# Patient Record
Sex: Female | Born: 1945 | Race: White | Hispanic: No | Smoking: Former smoker
Health system: Southern US, Community
[De-identification: ages and names within clinical notes are randomized; demographics above are authoritative.]

## PROBLEM LIST (undated history)

## (undated) DIAGNOSIS — M533 Sacrococcygeal disorders, not elsewhere classified: Secondary | ICD-10-CM

## (undated) DIAGNOSIS — F329 Major depressive disorder, single episode, unspecified: Secondary | ICD-10-CM

## (undated) DIAGNOSIS — Z801 Family history of malignant neoplasm of trachea, bronchus and lung: Secondary | ICD-10-CM

## (undated) DIAGNOSIS — Z808 Family history of malignant neoplasm of other organs or systems: Secondary | ICD-10-CM

## (undated) DIAGNOSIS — R51 Headache: Secondary | ICD-10-CM

## (undated) DIAGNOSIS — M75111 Incomplete rotator cuff tear or rupture of right shoulder, not specified as traumatic: Secondary | ICD-10-CM

## (undated) DIAGNOSIS — E785 Hyperlipidemia, unspecified: Secondary | ICD-10-CM

## (undated) DIAGNOSIS — F32A Depression, unspecified: Secondary | ICD-10-CM

## (undated) DIAGNOSIS — E049 Nontoxic goiter, unspecified: Secondary | ICD-10-CM

## (undated) DIAGNOSIS — K219 Gastro-esophageal reflux disease without esophagitis: Secondary | ICD-10-CM

## (undated) DIAGNOSIS — M1990 Unspecified osteoarthritis, unspecified site: Secondary | ICD-10-CM

## (undated) DIAGNOSIS — Z5189 Encounter for other specified aftercare: Secondary | ICD-10-CM

## (undated) DIAGNOSIS — R52 Pain, unspecified: Secondary | ICD-10-CM

## (undated) DIAGNOSIS — L309 Dermatitis, unspecified: Secondary | ICD-10-CM

## (undated) DIAGNOSIS — I2510 Atherosclerotic heart disease of native coronary artery without angina pectoris: Secondary | ICD-10-CM

## (undated) DIAGNOSIS — H269 Unspecified cataract: Secondary | ICD-10-CM

## (undated) DIAGNOSIS — R42 Dizziness and giddiness: Secondary | ICD-10-CM

## (undated) DIAGNOSIS — I639 Cerebral infarction, unspecified: Secondary | ICD-10-CM

## (undated) DIAGNOSIS — I10 Essential (primary) hypertension: Secondary | ICD-10-CM

## (undated) DIAGNOSIS — Z9889 Other specified postprocedural states: Secondary | ICD-10-CM

## (undated) DIAGNOSIS — Z800 Family history of malignant neoplasm of digestive organs: Secondary | ICD-10-CM

## (undated) DIAGNOSIS — Z803 Family history of malignant neoplasm of breast: Secondary | ICD-10-CM

## (undated) DIAGNOSIS — F419 Anxiety disorder, unspecified: Secondary | ICD-10-CM

## (undated) DIAGNOSIS — Z8051 Family history of malignant neoplasm of kidney: Secondary | ICD-10-CM

## (undated) HISTORY — DX: Nontoxic goiter, unspecified: E04.9

## (undated) HISTORY — DX: Hyperlipidemia, unspecified: E78.5

## (undated) HISTORY — DX: Incomplete rotator cuff tear or rupture of right shoulder, not specified as traumatic: M75.111

## (undated) HISTORY — DX: Dizziness and giddiness: R42

## (undated) HISTORY — DX: Essential (primary) hypertension: I10

## (undated) HISTORY — PX: BUNIONECTOMY: SHX129

## (undated) HISTORY — DX: Major depressive disorder, single episode, unspecified: F32.9

## (undated) HISTORY — DX: Anxiety disorder, unspecified: F41.9

## (undated) HISTORY — DX: Other specified postprocedural states: Z98.890

## (undated) HISTORY — DX: Encounter for other specified aftercare: Z51.89

## (undated) HISTORY — PX: CHOLECYSTECTOMY: SHX55

## (undated) HISTORY — DX: Sacrococcygeal disorders, not elsewhere classified: M53.3

## (undated) HISTORY — DX: Headache: R51

## (undated) HISTORY — PX: ABDOMINAL HYSTERECTOMY: SHX81

## (undated) HISTORY — DX: Dermatitis, unspecified: L30.9

## (undated) HISTORY — DX: Family history of malignant neoplasm of trachea, bronchus and lung: Z80.1

## (undated) HISTORY — DX: Unspecified cataract: H26.9

## (undated) HISTORY — DX: Family history of malignant neoplasm of digestive organs: Z80.0

## (undated) HISTORY — DX: Family history of malignant neoplasm of kidney: Z80.51

## (undated) HISTORY — PX: BREAST CYST ASPIRATION: SHX578

## (undated) HISTORY — DX: Family history of malignant neoplasm of other organs or systems: Z80.8

## (undated) HISTORY — PX: BREAST CYST EXCISION: SHX579

## (undated) HISTORY — PX: HAND SURGERY: SHX662

## (undated) HISTORY — PX: BREAST LUMPECTOMY: SHX2

## (undated) HISTORY — DX: Family history of malignant neoplasm of breast: Z80.3

## (undated) HISTORY — DX: Depression, unspecified: F32.A

## (undated) HISTORY — DX: Cerebral infarction, unspecified: I63.9

---

## 1987-07-24 DIAGNOSIS — Z5189 Encounter for other specified aftercare: Secondary | ICD-10-CM

## 1987-07-24 HISTORY — DX: Encounter for other specified aftercare: Z51.89

## 1998-09-28 ENCOUNTER — Other Ambulatory Visit: Admission: RE | Admit: 1998-09-28 | Discharge: 1998-09-28 | Payer: Self-pay | Admitting: *Deleted

## 1999-09-28 ENCOUNTER — Other Ambulatory Visit: Admission: RE | Admit: 1999-09-28 | Discharge: 1999-09-28 | Payer: Self-pay | Admitting: *Deleted

## 2000-09-23 ENCOUNTER — Other Ambulatory Visit: Admission: RE | Admit: 2000-09-23 | Discharge: 2000-09-23 | Payer: Self-pay | Admitting: *Deleted

## 2001-09-30 ENCOUNTER — Other Ambulatory Visit: Admission: RE | Admit: 2001-09-30 | Discharge: 2001-09-30 | Payer: Self-pay | Admitting: *Deleted

## 2002-07-23 DIAGNOSIS — I639 Cerebral infarction, unspecified: Secondary | ICD-10-CM

## 2002-07-23 HISTORY — DX: Cerebral infarction, unspecified: I63.9

## 2002-09-09 ENCOUNTER — Other Ambulatory Visit: Admission: RE | Admit: 2002-09-09 | Discharge: 2002-09-09 | Payer: Self-pay | Admitting: *Deleted

## 2002-12-08 ENCOUNTER — Encounter: Payer: Self-pay | Admitting: Internal Medicine

## 2003-09-13 ENCOUNTER — Other Ambulatory Visit: Admission: RE | Admit: 2003-09-13 | Discharge: 2003-09-13 | Payer: Self-pay | Admitting: *Deleted

## 2004-07-23 HISTORY — PX: SHOULDER SURGERY: SHX246

## 2004-08-07 ENCOUNTER — Ambulatory Visit: Payer: Self-pay | Admitting: Internal Medicine

## 2004-08-11 ENCOUNTER — Ambulatory Visit: Payer: Self-pay | Admitting: Internal Medicine

## 2004-09-12 ENCOUNTER — Other Ambulatory Visit: Admission: RE | Admit: 2004-09-12 | Discharge: 2004-09-12 | Payer: Self-pay | Admitting: *Deleted

## 2004-11-28 ENCOUNTER — Ambulatory Visit: Payer: Self-pay | Admitting: Internal Medicine

## 2005-03-14 ENCOUNTER — Ambulatory Visit: Payer: Self-pay | Admitting: Internal Medicine

## 2005-08-02 ENCOUNTER — Ambulatory Visit: Payer: Self-pay | Admitting: Internal Medicine

## 2005-08-30 ENCOUNTER — Ambulatory Visit: Payer: Self-pay | Admitting: Internal Medicine

## 2005-09-06 ENCOUNTER — Encounter: Admission: RE | Admit: 2005-09-06 | Discharge: 2005-09-06 | Payer: Self-pay | Admitting: Internal Medicine

## 2005-09-14 ENCOUNTER — Ambulatory Visit: Payer: Self-pay

## 2005-09-25 ENCOUNTER — Ambulatory Visit: Payer: Self-pay | Admitting: Internal Medicine

## 2005-10-03 ENCOUNTER — Other Ambulatory Visit: Admission: RE | Admit: 2005-10-03 | Discharge: 2005-10-03 | Payer: Self-pay | Admitting: *Deleted

## 2005-10-05 ENCOUNTER — Ambulatory Visit: Payer: Self-pay | Admitting: Internal Medicine

## 2005-10-15 ENCOUNTER — Encounter: Admission: RE | Admit: 2005-10-15 | Discharge: 2005-10-15 | Payer: Self-pay | Admitting: Internal Medicine

## 2005-10-23 ENCOUNTER — Encounter: Admission: RE | Admit: 2005-10-23 | Discharge: 2005-10-23 | Payer: Self-pay | Admitting: *Deleted

## 2005-11-12 ENCOUNTER — Ambulatory Visit: Payer: Self-pay | Admitting: Internal Medicine

## 2006-03-29 ENCOUNTER — Ambulatory Visit: Payer: Self-pay | Admitting: Internal Medicine

## 2006-04-04 ENCOUNTER — Encounter: Admission: RE | Admit: 2006-04-04 | Discharge: 2006-04-04 | Payer: Self-pay | Admitting: Internal Medicine

## 2006-04-18 ENCOUNTER — Ambulatory Visit: Payer: Self-pay | Admitting: Internal Medicine

## 2006-05-13 ENCOUNTER — Ambulatory Visit: Payer: Self-pay | Admitting: Family Medicine

## 2006-05-23 ENCOUNTER — Ambulatory Visit: Payer: Self-pay | Admitting: Endocrinology

## 2006-06-19 ENCOUNTER — Ambulatory Visit: Payer: Self-pay | Admitting: Internal Medicine

## 2006-06-19 LAB — CONVERTED CEMR LAB
BUN: 14 mg/dL (ref 6–23)
CO2: 25 meq/L (ref 19–32)
Calcium: 9.4 mg/dL (ref 8.4–10.5)
Chloride: 104 meq/L (ref 96–112)
Glomerular Filtration Rate, Af Am: 82 mL/min/{1.73_m2}
Glucose, Bld: 131 mg/dL — ABNORMAL HIGH (ref 70–99)
HCT: 41.9 % (ref 36.0–46.0)
Hemoglobin: 14.1 g/dL (ref 12.0–15.0)
Platelets: 233 10*3/uL (ref 150–400)
WBC: 6.8 10*3/uL (ref 4.5–10.5)

## 2006-06-20 ENCOUNTER — Ambulatory Visit: Payer: Self-pay | Admitting: Internal Medicine

## 2006-08-07 ENCOUNTER — Ambulatory Visit: Payer: Self-pay | Admitting: Internal Medicine

## 2006-10-21 ENCOUNTER — Other Ambulatory Visit: Admission: RE | Admit: 2006-10-21 | Discharge: 2006-10-21 | Payer: Self-pay | Admitting: *Deleted

## 2006-11-12 DIAGNOSIS — E042 Nontoxic multinodular goiter: Secondary | ICD-10-CM

## 2007-01-22 ENCOUNTER — Ambulatory Visit: Payer: Self-pay | Admitting: Internal Medicine

## 2007-01-22 DIAGNOSIS — M25559 Pain in unspecified hip: Secondary | ICD-10-CM

## 2007-01-26 LAB — CONVERTED CEMR LAB
Free T4: 0.6 ng/dL (ref 0.6–1.6)
T3, Free: 3.3 pg/mL (ref 2.3–4.2)
TSH: 1.56 microintl units/mL (ref 0.35–5.50)

## 2007-01-27 ENCOUNTER — Encounter: Payer: Self-pay | Admitting: Internal Medicine

## 2007-01-27 ENCOUNTER — Encounter: Admission: RE | Admit: 2007-01-27 | Discharge: 2007-01-27 | Payer: Self-pay | Admitting: Internal Medicine

## 2007-01-27 ENCOUNTER — Encounter (INDEPENDENT_AMBULATORY_CARE_PROVIDER_SITE_OTHER): Payer: Self-pay | Admitting: *Deleted

## 2007-02-03 ENCOUNTER — Encounter (INDEPENDENT_AMBULATORY_CARE_PROVIDER_SITE_OTHER): Payer: Self-pay | Admitting: *Deleted

## 2007-04-18 ENCOUNTER — Encounter (INDEPENDENT_AMBULATORY_CARE_PROVIDER_SITE_OTHER): Payer: Self-pay | Admitting: *Deleted

## 2007-05-09 ENCOUNTER — Encounter: Payer: Self-pay | Admitting: Endocrinology

## 2007-05-09 ENCOUNTER — Ambulatory Visit: Payer: Self-pay | Admitting: Internal Medicine

## 2007-05-09 DIAGNOSIS — I10 Essential (primary) hypertension: Secondary | ICD-10-CM | POA: Insufficient documentation

## 2007-05-12 LAB — CONVERTED CEMR LAB
ALT: 15 units/L (ref 0–35)
Basophils Absolute: 0 10*3/uL (ref 0.0–0.1)
Eosinophils Absolute: 0 10*3/uL (ref 0.0–0.6)
Eosinophils Relative: 0.6 % (ref 0.0–5.0)
GFR calc Af Amer: 93 mL/min
GFR calc non Af Amer: 77 mL/min
Glucose, Bld: 109 mg/dL — ABNORMAL HIGH (ref 70–99)
HCT: 40.5 % (ref 36.0–46.0)
HDL: 41.2 mg/dL (ref >39.0)
Lymphocytes Relative: 29.6 % (ref 12.0–46.0)
MCHC: 34.8 g/dL (ref 30.0–36.0)
MCV: 94.7 fL (ref 78.0–100.0)
Neutro Abs: 4.4 10*3/uL (ref 1.4–7.7)
Neutrophils Relative %: 62.8 % (ref 43.0–77.0)
Platelets: 215 10*3/uL (ref 150–400)
Potassium: 5 meq/L (ref 3.5–5.1)
Sodium: 143 meq/L (ref 135–145)
WBC: 7 10*3/uL (ref 4.5–10.5)

## 2007-05-28 ENCOUNTER — Encounter: Payer: Self-pay | Admitting: Internal Medicine

## 2007-06-02 ENCOUNTER — Telehealth (INDEPENDENT_AMBULATORY_CARE_PROVIDER_SITE_OTHER): Payer: Self-pay | Admitting: *Deleted

## 2007-06-17 ENCOUNTER — Ambulatory Visit: Payer: Self-pay | Admitting: Internal Medicine

## 2007-07-24 DIAGNOSIS — Z9889 Other specified postprocedural states: Secondary | ICD-10-CM

## 2007-07-24 HISTORY — DX: Other specified postprocedural states: Z98.890

## 2007-09-17 ENCOUNTER — Ambulatory Visit: Payer: Self-pay | Admitting: Internal Medicine

## 2007-10-06 ENCOUNTER — Other Ambulatory Visit: Admission: RE | Admit: 2007-10-06 | Discharge: 2007-10-06 | Payer: Self-pay | Admitting: *Deleted

## 2007-10-15 ENCOUNTER — Ambulatory Visit: Payer: Self-pay | Admitting: Internal Medicine

## 2007-10-15 DIAGNOSIS — F419 Anxiety disorder, unspecified: Secondary | ICD-10-CM

## 2007-10-15 DIAGNOSIS — F329 Major depressive disorder, single episode, unspecified: Secondary | ICD-10-CM

## 2007-10-17 ENCOUNTER — Encounter (INDEPENDENT_AMBULATORY_CARE_PROVIDER_SITE_OTHER): Payer: Self-pay | Admitting: *Deleted

## 2007-10-22 ENCOUNTER — Telehealth (INDEPENDENT_AMBULATORY_CARE_PROVIDER_SITE_OTHER): Payer: Self-pay | Admitting: *Deleted

## 2007-10-22 ENCOUNTER — Encounter: Admission: RE | Admit: 2007-10-22 | Discharge: 2007-10-22 | Payer: Self-pay | Admitting: Internal Medicine

## 2007-10-27 ENCOUNTER — Ambulatory Visit: Payer: Self-pay | Admitting: Internal Medicine

## 2007-10-30 ENCOUNTER — Encounter: Payer: Self-pay | Admitting: Internal Medicine

## 2007-10-30 ENCOUNTER — Telehealth (INDEPENDENT_AMBULATORY_CARE_PROVIDER_SITE_OTHER): Payer: Self-pay | Admitting: *Deleted

## 2007-11-05 ENCOUNTER — Encounter: Payer: Self-pay | Admitting: Internal Medicine

## 2007-11-06 ENCOUNTER — Telehealth: Payer: Self-pay | Admitting: Internal Medicine

## 2007-11-16 ENCOUNTER — Encounter: Admission: RE | Admit: 2007-11-16 | Discharge: 2007-11-16 | Payer: Self-pay | Admitting: Otolaryngology

## 2007-11-24 ENCOUNTER — Ambulatory Visit: Payer: Self-pay | Admitting: Internal Medicine

## 2007-11-25 ENCOUNTER — Telehealth (INDEPENDENT_AMBULATORY_CARE_PROVIDER_SITE_OTHER): Payer: Self-pay | Admitting: *Deleted

## 2007-11-25 ENCOUNTER — Ambulatory Visit: Payer: Self-pay | Admitting: Internal Medicine

## 2007-11-25 ENCOUNTER — Observation Stay (HOSPITAL_COMMUNITY): Admission: EM | Admit: 2007-11-25 | Discharge: 2007-11-26 | Payer: Self-pay | Admitting: Emergency Medicine

## 2007-11-27 ENCOUNTER — Telehealth (INDEPENDENT_AMBULATORY_CARE_PROVIDER_SITE_OTHER): Payer: Self-pay | Admitting: *Deleted

## 2008-01-19 ENCOUNTER — Encounter: Payer: Self-pay | Admitting: Internal Medicine

## 2008-06-23 ENCOUNTER — Telehealth (INDEPENDENT_AMBULATORY_CARE_PROVIDER_SITE_OTHER): Payer: Self-pay | Admitting: *Deleted

## 2008-07-23 DIAGNOSIS — F32A Depression, unspecified: Secondary | ICD-10-CM

## 2008-07-23 HISTORY — DX: Depression, unspecified: F32.A

## 2008-07-28 ENCOUNTER — Telehealth (INDEPENDENT_AMBULATORY_CARE_PROVIDER_SITE_OTHER): Payer: Self-pay | Admitting: *Deleted

## 2008-08-26 ENCOUNTER — Telehealth (INDEPENDENT_AMBULATORY_CARE_PROVIDER_SITE_OTHER): Payer: Self-pay | Admitting: *Deleted

## 2008-09-27 ENCOUNTER — Ambulatory Visit: Payer: Self-pay | Admitting: Family Medicine

## 2008-09-27 ENCOUNTER — Telehealth (INDEPENDENT_AMBULATORY_CARE_PROVIDER_SITE_OTHER): Payer: Self-pay | Admitting: *Deleted

## 2008-11-19 ENCOUNTER — Encounter (INDEPENDENT_AMBULATORY_CARE_PROVIDER_SITE_OTHER): Payer: Self-pay | Admitting: *Deleted

## 2008-12-14 ENCOUNTER — Ambulatory Visit: Payer: Self-pay | Admitting: Internal Medicine

## 2008-12-23 LAB — CONVERTED CEMR LAB
BUN: 19 mg/dL (ref 6–23)
Basophils Absolute: 0 10*3/uL (ref 0.0–0.1)
Basophils Relative: 0 % (ref 0.0–3.0)
CO2: 27 meq/L (ref 19–32)
Chloride: 106 meq/L (ref 96–112)
Creatinine, Ser: 0.7 mg/dL (ref 0.4–1.2)
Eosinophils Absolute: 0 10*3/uL (ref 0.0–0.7)
Glucose, Bld: 124 mg/dL — ABNORMAL HIGH (ref 70–99)
MCHC: 34.6 g/dL (ref 30.0–36.0)
MCV: 94.6 fL (ref 78.0–100.0)
Monocytes Absolute: 0.5 10*3/uL (ref 0.1–1.0)
Neutrophils Relative %: 60.3 % (ref 43.0–77.0)
Platelets: 171 10*3/uL (ref 150.0–400.0)
RBC: 4.49 M/uL (ref 3.87–5.11)
RDW: 12.8 % (ref 11.5–14.6)

## 2009-05-30 ENCOUNTER — Telehealth (INDEPENDENT_AMBULATORY_CARE_PROVIDER_SITE_OTHER): Payer: Self-pay | Admitting: *Deleted

## 2009-06-01 ENCOUNTER — Encounter (INDEPENDENT_AMBULATORY_CARE_PROVIDER_SITE_OTHER): Payer: Self-pay | Admitting: *Deleted

## 2009-07-18 ENCOUNTER — Ambulatory Visit: Payer: Self-pay | Admitting: Internal Medicine

## 2009-07-18 LAB — CONVERTED CEMR LAB: Vit D, 25-Hydroxy: 14 ng/mL — ABNORMAL LOW (ref 30–89)

## 2009-07-19 ENCOUNTER — Encounter (INDEPENDENT_AMBULATORY_CARE_PROVIDER_SITE_OTHER): Payer: Self-pay | Admitting: *Deleted

## 2009-07-20 ENCOUNTER — Telehealth (INDEPENDENT_AMBULATORY_CARE_PROVIDER_SITE_OTHER): Payer: Self-pay | Admitting: *Deleted

## 2009-07-20 LAB — CONVERTED CEMR LAB
AST: 22 units/L (ref 0–37)
Albumin: 4.1 g/dL (ref 3.5–5.2)
Alkaline Phosphatase: 71 units/L (ref 39–117)
BUN: 14 mg/dL (ref 6–23)
CO2: 27 meq/L (ref 19–32)
Calcium: 9.4 mg/dL (ref 8.4–10.5)
Chloride: 105 meq/L (ref 96–112)
Cholesterol: 209 mg/dL — ABNORMAL HIGH (ref 0–200)
Creatinine, Ser: 0.7 mg/dL (ref 0.4–1.2)
Total Bilirubin: 0.5 mg/dL (ref 0.3–1.2)
Total CHOL/HDL Ratio: 5
Total Protein: 7.5 g/dL (ref 6.0–8.3)
VLDL: 39 mg/dL (ref 0.0–40.0)

## 2009-07-25 ENCOUNTER — Ambulatory Visit: Payer: Self-pay | Admitting: Internal Medicine

## 2009-07-25 ENCOUNTER — Encounter: Payer: Self-pay | Admitting: Internal Medicine

## 2009-08-05 ENCOUNTER — Encounter (INDEPENDENT_AMBULATORY_CARE_PROVIDER_SITE_OTHER): Payer: Self-pay | Admitting: *Deleted

## 2009-08-08 ENCOUNTER — Ambulatory Visit: Payer: Self-pay | Admitting: Gastroenterology

## 2009-08-10 ENCOUNTER — Telehealth (INDEPENDENT_AMBULATORY_CARE_PROVIDER_SITE_OTHER): Payer: Self-pay | Admitting: *Deleted

## 2009-08-12 ENCOUNTER — Ambulatory Visit: Payer: Self-pay | Admitting: Family

## 2009-08-12 DIAGNOSIS — M25519 Pain in unspecified shoulder: Secondary | ICD-10-CM

## 2009-08-18 ENCOUNTER — Ambulatory Visit: Payer: Self-pay | Admitting: Gastroenterology

## 2009-08-23 ENCOUNTER — Telehealth: Payer: Self-pay | Admitting: Internal Medicine

## 2009-08-27 ENCOUNTER — Encounter: Admission: RE | Admit: 2009-08-27 | Discharge: 2009-08-27 | Payer: Self-pay | Admitting: Orthopedic Surgery

## 2009-09-02 ENCOUNTER — Encounter: Payer: Self-pay | Admitting: Internal Medicine

## 2009-09-09 ENCOUNTER — Telehealth: Payer: Self-pay | Admitting: Internal Medicine

## 2009-09-13 ENCOUNTER — Encounter: Payer: Self-pay | Admitting: Internal Medicine

## 2009-09-30 ENCOUNTER — Telehealth (INDEPENDENT_AMBULATORY_CARE_PROVIDER_SITE_OTHER): Payer: Self-pay | Admitting: *Deleted

## 2009-10-05 ENCOUNTER — Telehealth (INDEPENDENT_AMBULATORY_CARE_PROVIDER_SITE_OTHER): Payer: Self-pay | Admitting: *Deleted

## 2009-10-27 ENCOUNTER — Telehealth: Payer: Self-pay | Admitting: Internal Medicine

## 2010-01-30 ENCOUNTER — Encounter: Payer: Self-pay | Admitting: Internal Medicine

## 2010-04-13 ENCOUNTER — Telehealth: Payer: Self-pay | Admitting: Family Medicine

## 2010-06-12 ENCOUNTER — Ambulatory Visit: Payer: Self-pay | Admitting: Internal Medicine

## 2010-06-12 DIAGNOSIS — E785 Hyperlipidemia, unspecified: Secondary | ICD-10-CM

## 2010-06-12 DIAGNOSIS — M1990 Unspecified osteoarthritis, unspecified site: Secondary | ICD-10-CM

## 2010-06-19 ENCOUNTER — Ambulatory Visit: Payer: Self-pay | Admitting: Family Medicine

## 2010-06-19 DIAGNOSIS — L0291 Cutaneous abscess, unspecified: Secondary | ICD-10-CM

## 2010-06-19 DIAGNOSIS — L0390 Cellulitis, unspecified: Secondary | ICD-10-CM

## 2010-06-26 ENCOUNTER — Encounter: Payer: Self-pay | Admitting: Internal Medicine

## 2010-07-05 ENCOUNTER — Encounter
Admission: RE | Admit: 2010-07-05 | Discharge: 2010-07-05 | Payer: Self-pay | Source: Home / Self Care | Attending: Specialist | Admitting: Specialist

## 2010-07-12 ENCOUNTER — Ambulatory Visit: Payer: Self-pay | Admitting: Internal Medicine

## 2010-07-13 LAB — CONVERTED CEMR LAB
ALT: 16 units/L (ref 0–35)
AST: 20 units/L (ref 0–37)
HDL: 40.4 mg/dL (ref >39.00)
Total CHOL/HDL Ratio: 4

## 2010-07-19 ENCOUNTER — Encounter: Payer: Self-pay | Admitting: Internal Medicine

## 2010-08-01 ENCOUNTER — Encounter: Payer: Self-pay | Admitting: Internal Medicine

## 2010-08-14 ENCOUNTER — Telehealth: Payer: Self-pay | Admitting: Internal Medicine

## 2010-08-22 NOTE — Letter (Signed)
Summary: Surgical Clearance/[facility]  Surgical Clearance/[facility]   Imported By: Lanelle Bal 09/19/2009 09:09:35  _____________________________________________________________________  External Attachment:    Type:   Image     Comment:   External Document

## 2010-08-22 NOTE — Progress Notes (Signed)
Summary: diarrhea  Phone Note Call from Patient   Caller: daughter Elita Quick (Dr. Lindaann Slough nurse) Summary of Call: Diarrhea x yesterday - no relief with immodium.  Patient had shoulder surgery 2 weeks ago.  Patient has recently visited people in the hospital. ?norovirus? ?continue fluids & immodium, watch for signs of dehydration? ?stool cx to check for c.diff since she has been in the hosp recently? Shary Decamp  October 27, 2009 10:46 AM   Follow-up for Phone Call        yes please , get stools  for c. diff Floy Riegler E. Doris Mcgilvery MD  October 27, 2009 12:52 PM   DISCUSSED WITH DAUGHTER -- ORDER IN THE COMPUTER Shary Decamp  October 27, 2009 1:00 PM   New Problems: DIARRHEA [phone])   New Problems: DIARRHEA (ICD-787.91)

## 2010-08-22 NOTE — Letter (Signed)
Summary: [facility] Instructions  [facility]  [address] [place], [place] [address]   Phone: [phone]  Fax: [phone]       [name]    May 11, 1946    MRN: [id number]        Procedure Day /Date:  08/18/09  Thursday     Arrival Time:_ 9:00am     Procedure Time:_10:00am     Location of Procedure:                    _ x_  [facility] (4th Floor)                        PREPARATION FOR COLONOSCOPY WITH MOVIPREP   Starting 5 days prior to your procedure _1/22/11 _ do not eat nuts, seeds, popcorn, corn, beans, peas,  salads, or any raw vegetables.  Do not take any fiber supplements (e.g. Metamucil, Citrucel, and Benefiber).  THE DAY BEFORE YOUR PROCEDURE         DATE:  08/17/09  DAY:  Wednesday  1.  Drink clear liquids the entire day-NO SOLID FOOD  2.  Do not drink anything colored red or purple.  Avoid juices with pulp.  No orange juice.  3.  Drink at least 64 oz. (8 glasses) of fluid/clear liquids during the day to prevent dehydration and help the prep work efficiently.  CLEAR LIQUIDS INCLUDE: Water Jello Ice Popsicles Tea (sugar ok, no milk/cream) Powdered fruit flavored drinks Coffee (sugar ok, no milk/cream) Gatorade Juice: apple, white grape, white cranberry  Lemonade Clear bullion, consomm, broth Carbonated beverages (any kind) Strained chicken noodle soup Hard Candy                             4.  In the morning, mix first dose of MoviPrep solution:    Empty 1 Pouch A and 1 Pouch B into the disposable container    Add lukewarm drinking water to the top line of the container. Mix to dissolve    Refrigerate (mixed solution should be used within 24 hrs)  5.  Begin drinking the prep at 5:00 p.m. The MoviPrep container is divided by 4 marks.   Every 15 minutes drink the solution down to the next mark (approximately 8 oz) until the full liter is complete.   6.  Follow completed prep with 16 oz of clear liquid of your  choice (Nothing red or purple).  Continue to drink clear liquids until bedtime.  7.  Before going to bed, mix second dose of MoviPrep solution:    Empty 1 Pouch A and 1 Pouch B into the disposable container    Add lukewarm drinking water to the top line of the container. Mix to dissolve    Refrigerate  THE DAY OF YOUR PROCEDURE      DATE:  08/18/09 DAY:   Thursday  Beginning at _5:00 a.m. (5 hours before procedure):         1. Every 15 minutes, drink the solution down to the next mark (approx 8 oz) until the full liter is complete.  2. Follow completed prep with 16 oz. of clear liquid of your choice.    3. You may drink clear liquids until 8:00am (2 HOURS BEFORE PROCEDURE).   MEDICATION INSTRUCTIONS  Unless otherwise instructed, you should take regular prescription medications with a small sip of water   as early as  possible the morning of your procedure.           OTHER INSTRUCTIONS  You will need a responsible adult at least 67 years of age to accompany you and drive you home.   This person must remain in the waiting room during your procedure.  Wear loose fitting clothing that is easily removed.  Leave jewelry and other valuables at home.  However, you may wish to bring a book to read or  an iPod/MP3 player to listen to music as you wait for your procedure to start.  Remove all body piercing jewelry and leave at home.  Total time from sign-in until discharge is approximately 2-3 hours.  You should go home directly after your procedure and rest.  You can resume normal activities the  day after your procedure.  The day of your procedure you should not:   Drive   Make legal decisions   Operate machinery   Drink alcohol   Return to work  You will receive specific instructions about eating, activities and medications before you leave.    The above instructions have been reviewed and explained to me by   Wyona Almas RN  August 08, 2009 10:25  AM     I fully understand and can verbalize these instructions _____________________________ Date _________

## 2010-08-22 NOTE — Progress Notes (Signed)
Summary: referral to surgeon  Phone Note Call from Patient Call back at Home Phone [phone]   Summary of Call: PATIENT LEFT MESSAGE ON VM:  - went to ortho for shoulder, has MRI on shoulder scheduled for 2/17  - ortho found a lump under left arm -- ortho suggested referral to surgeon for excision ok to refer to surgeon? Shary Decamp  August 23, 2009 11:56 AM   Follow-up for Phone Call        yes Follow-up by: [facility] E. Aide Wojnar MD,  August 23, 2009 1:59 PM

## 2010-08-22 NOTE — Assessment & Plan Note (Signed)
Summary: F/U AND DISCUSS LABS/KB   Vital Signs:  Patient profile:   65 year old female Height:      60.75 inches Weight:      181.38 pounds BMI:     34.68 Pulse rate:   95 / minute Pulse rhythm:   regular BP sitting:   136 / 84  (left arm) Cuff size:   large  Vitals Entered By: Army Fossa CMA (June 12, 2010 11:06 AM) CC: F/u, fasting  Comments c/o back pain in the center, pain in her legs. Ongoing for 6 months [facility]  Discuss flu shot  Unsure if she is taking metorprolol- she says she has always taken it.    History of Present Illness: routine office visit  She was seen in December 2010 for a CPX, was found to have a low vitamin D and high cholesterol. Was prescribed ergocalciferol, states she took it. Was prescribed simvastatin, she took it for while but then she ran out  complaining of low back pain for almost a year. Pain is there most of the time, occasionally gets worse, radiates to the right buttock and right leg. she has no history of injury or previous surgery in the back In the past she was prescribed muscle relaxant  for  other issues and relaxants helped   Review of systems Denies any fevers No rash in the back No bladder or bowel incontinence some tingling in the right foot   Current Medications (verified): 1)  Felodipine 10 Mg Tb24 (Felodipine) .... Take 1 Tablet By Mouth Once A Day - 2)  Celebrex 200 Mg  Caps (Celecoxib) .... One P.o. Daily 3)  Bayer Aspirin Ec Low Dose 81 Mg  Tbec (Aspirin) 4)  Citalopram Hydrobromide 20 Mg  Tabs (Citalopram Hydrobromide) .Marland Kitchen.. 1 By Mouth Qd 5)  Alprazolam 0.25 Mg  Tabs (Alprazolam) .Marland Kitchen.. 1--2 By Mouth Three Times A Day Prn 6)  Omeprazole 20 Mg Tbec (Omeprazole) .Marland Kitchen.. 1 By Mouth Once Daily 7)  Vitamin D (Ergocalciferol) 50000 Unit Caps (Ergocalciferol) .... Take One Tablet Weekly 8)  Simvastatin 20 Mg Tabs (Simvastatin) .... Take One Tablet At Bedtime 9)  Ultram 50 Mg Tabs (Tramadol Hcl) .... One  Tablet By Mouth Three Times A Day As Needed Pain 10)  Metoprolol Tartrate 25 Mg Tabs (Metoprolol Tartrate) .... Take One Tablet Two Times A Day 2 Days Before Surgery and 2 Days After Surgery  Allergies (verified): 1)  ! Codeine 2)  ! Zoloft  Past History:  Past Medical History: Anxiety--Depression Headache ECZEMA  HYPERTENSION   GOITER, MULTINODULAR   Dizziness, sever: MRI chronic isch. changes and  mastoiditis-saw Dr Lenard Forth (2007) Cath 5-09   A.  Revealing nonobstructive coronary artery disease with tubular,  discrete, 40% mid lesion in the circumflex; discrete 30% proximal lesion  in the LAD; luminal irregularities, 35% mid lesion in RCA; and generic  40% mid lesion in the RCA.  Medical treatment recommended.     Hyperlipidemia  Past Surgical History: Reviewed history from 07/18/2009 and no changes required. Cholecystectomy Hysterectomy, L oophorectomy L shoulder surgery aprox 2006  Social History: Married 4 kids exercise : not recent exercise   tobacco-- quit aprox 2000  after 30 years  ETOH-- no  Physical Exam  General:  alert and well-developed.   Lungs:  normal respiratory effort, no intercostal retractions, no accessory muscle use, and normal breath sounds.   Heart:  normal rate, regular rhythm, no murmur, and no gallop.   Msk:  slightly  tender at the lower right back Extremities:  no pretibial edema bilaterally  Neurologic:  alert & oriented X3, strength normal in all extremities, and DTRs symmetrical , normal knee jerk, decrease ankle reflex B . negative straight leg test   Impression & Recommendations:  Problem # 1:  BACK PAIN (ICD-724.5)  back pain for one year with radiculopathy features Continue with her present care and add a muscle relaxant Referred to orthopedic surgery Her updated medication list for this problem includes:    Bayer Aspirin Ec Low Dose 81 Mg Tbec (Aspirin)    Celebrex 200 Mg Caps (Celecoxib) ..... One p.o. daily    Ultram 50 Mg  Tabs (Tramadol hcl) ..... One tablet by mouth three times a day as needed pain    Flexeril 10 Mg Tabs (Cyclobenzaprine hcl) ..... One by mouth twice a day as needed pain  Orders: Orthopedic Referral (Ortho)  Problem # 2:  HYPERTENSION (ICD-401.9) at goal  The following medications were removed from the medication list:    Metoprolol Tartrate 25 Mg Tabs (Metoprolol tartrate) .Marland Kitchen... Take one tablet two times a day 2 days before surgery and 2 days after surgery Her updated medication list for this problem includes:    Felodipine 10 Mg Tb24 (Felodipine) .Marland Kitchen... Take 1 tablet by mouth once a day -  BP today: 136/84 Prior BP: 122/78 (08/12/2009)  Labs Reviewed: K+: 4.1 (07/18/2009) Creat: : 0.7 (07/18/2009)   Chol: 209 (07/18/2009)   HDL: 39.80 (07/18/2009)   LDL: DEL (05/09/2007)   TG: 195.0 (07/18/2009)  Problem # 3:  HYPERLIPIDEMIA (ICD-272.4) was recommended simvastatin based on the FLP from 12-10. took it for a few days but ran out. Plan: Prescription provided, see instructions Her updated medication list for this problem includes:    Simvastatin 20 Mg Tabs (Simvastatin) .Marland Kitchen... Take one tablet at bedtime  Labs Reviewed: SGOT: 22 (07/18/2009)   SGPT: 17 (07/18/2009)   HDL:39.80 (07/18/2009), 41.2 (05/09/2007)  LDL:DEL (05/09/2007)  Chol:209 (07/18/2009), 203 (05/09/2007)  Trig:195.0 (07/18/2009), 140 (05/09/2007)  Complete Medication List: 1)  Bayer Aspirin Ec Low Dose 81 Mg Tbec (Aspirin) 2)  Simvastatin 20 Mg Tabs (Simvastatin) .... Take one tablet at bedtime 3)  Felodipine 10 Mg Tb24 (Felodipine) .... Take 1 tablet by mouth once a day - 4)  Celebrex 200 Mg Caps (Celecoxib) .... One p.o. daily 5)  Ultram 50 Mg Tabs (Tramadol hcl) .... One tablet by mouth three times a day as needed pain 6)  Flexeril 10 Mg Tabs (Cyclobenzaprine hcl) .... One by mouth twice a day as needed pain 7)  Citalopram Hydrobromide 20 Mg Tabs (Citalopram hydrobromide) .Marland Kitchen.. 1 by mouth qd 8)  Alprazolam 0.25 Mg  Tabs (Alprazolam) .Marland Kitchen.. 1--2 by mouth three times a day prn 9)  Omeprazole 20 Mg Tbec (Omeprazole) .Marland Kitchen.. 1 by mouth once daily  Other Orders: Flu Vaccine 11yrs + MEDICARE PATIENTS (Z6109) Administration Flu vaccine - MCR (U0454)  Patient Instructions: 1)  Back pain: continue with Celebrex and Ultram. 2)  also  try Flexeril, twice a day, it is a muscle relaxant. Will make her drowsy.  3)  heating pad 4)  orthopedic referal 5)  start simvastatin 6)  Please schedule a follow-up appointment in 6 weeks, fasting Prescriptions: CELEBREX 200 MG  CAPS (CELECOXIB) one p.o. daily  #30 x 12   Entered and Authorized by:   Jose E. Paz MD   Signed by:   Nolon Rod. Paz MD on 06/12/2010   Method used:   Print then  Give to Patient   RxID:   [id number] ULTRAM 50 MG TABS (TRAMADOL HCL) one tablet by mouth three times a day as needed pain  #60 x 0   Entered and Authorized by:   Nolon Rod. Paz MD   Signed by:   Nolon Rod. Paz MD on 06/12/2010   Method used:   Print then Give to Patient   RxID:   [id number] ALPRAZOLAM 0.25 MG  TABS (ALPRAZOLAM) 1--2 by mouth three times a day prn  #30 x 3   Entered and Authorized by:   Nolon Rod. Paz MD   Signed by:   Nolon Rod. Paz MD on 06/12/2010   Method used:   Print then Give to Patient   RxID:   [id number] FLEXERIL 10 MG TABS (CYCLOBENZAPRINE HCL) one by mouth twice a day as needed pain  #60 x 0   Entered and Authorized by:   Nolon Rod. Paz MD   Signed by:   Nolon Rod. Paz MD on 06/12/2010   Method used:   Print then Give to Patient   RxID:   [id number] SIMVASTATIN 20 MG TABS (SIMVASTATIN) take one tablet at bedtime  #30 Tablet x 6   Entered by:   Army Fossa CMA   Authorized by:   Nolon Rod. Paz MD   Signed by:   Nolon Rod. Paz MD on 06/12/2010   Method used:   Electronically to        [id number] (retail)       [address]       [place], [place]  [address]       Ph: [phone] or [phone]       Fax:  [phone]   RxID:   [id number] OMEPRAZOLE 20 MG TBEC (OMEPRAZOLE) 1 by mouth once daily  #30 Capsule x 12   Entered by:   Army Fossa CMA   Authorized by:   Nolon Rod. Paz MD   Signed by:   Nolon Rod. Paz MD on 06/12/2010   Method used:   Electronically to        [id number]* (retail)       [address]       [place], [place]  [address]       Ph: [phone] or [phone]       Fax: [phone]   RxID:   [id number] CITALOPRAM HYDROBROMIDE 20 MG  TABS (CITALOPRAM HYDROBROMIDE) 1 by mouth qd  #30 Tablet x 12   Entered by:   Army Fossa CMA   Authorized by:   Nolon Rod. Paz MD   Signed by:   Nolon Rod. Paz MD on 06/12/2010   Method used:   Electronically to        [id number]* (retail)       [address]       [place], [place]  [address]       Ph: [phone] or [phone]       Fax: [phone]   RxID:   [id number] FELODIPINE 10 MG TB24 (FELODIPINE) Take 1 tablet by mouth once a day -  #30 x 12   Entered by:   Army Fossa CMA   Authorized by:   Nolon Rod. Paz MD   Signed by:   Nolon Rod. Paz MD  on 06/12/2010   Method used:   Electronically to        [id number] (retail)       [address]       [place], [place]  [address]       Ph: [phone] or [phone]       Fax: [phone]   RxID:   [id number]    Orders Added: 1)  Flu Vaccine 54yrs + MEDICARE PATIENTS [Q2039] 2)  Administration Flu vaccine - MCR [G0008] 3)  Orthopedic Referral [Ortho] 4)  Est. Patient Level III [27[address]] Flu Vaccine Consent Questions     Do you have a history of severe allergic reactions to this vaccine? no    Any prior history of allergic reactions to egg and/or gelatin? no    Do you have a sensitivity to the preservative Thimersol? no    Do you have a past history of Guillan-Barre Syndrome? no    Do you currently have an acute febrile illness? no    Have  you ever had a severe reaction to latex? no    Vaccine information given and explained to patient? yes    Are you currently pregnant? no    Lot Number:[id number]   Exp Date:01/20/2011   Site Given  Left Deltoid IMration Flu vaccine - MCR [G0008]       .[email or web address]

## 2010-08-22 NOTE — Assessment & Plan Note (Signed)
Summary: EAR PAIN//PH   Vital Signs:  Patient profile:   65 year old female Weight:      180 pounds Temp:     99.1 degrees F oral BP sitting:   120 / 78  (left arm)  Vitals Entered By: Doristine Devoid CMA (June 19, 2010 1:15 PM) CC: L ear pain w/ redness and swellen along w/ HA hurts to lay on L side of face   History of Present Illness: 65 yo woman here today w/ L ear pain.  woke up Saturday morning w/ ear pain and inability to lie on L side of face/head.  feels like face is swollen.  ear is red and swollen.  no trauma to ear.  recent exposure to MRSA.  subjective fever at home.  no internal ear pain.  no drainage from ear.  Current Medications (verified): 1)  Bayer Aspirin Ec Low Dose 81 Mg  Tbec (Aspirin) 2)  Simvastatin 20 Mg Tabs (Simvastatin) .... Take One Tablet At Bedtime 3)  Felodipine 10 Mg Tb24 (Felodipine) .... Take 1 Tablet By Mouth Once A Day - 4)  Celebrex 200 Mg  Caps (Celecoxib) .... One P.o. Daily 5)  Ultram 50 Mg Tabs (Tramadol Hcl) .... One Tablet By Mouth Three Times A Day As Needed Pain 6)  Flexeril 10 Mg Tabs (Cyclobenzaprine Hcl) .... One By Mouth Twice A Day As Needed Pain 7)  Citalopram Hydrobromide 20 Mg  Tabs (Citalopram Hydrobromide) .Marland Kitchen.. 1 By Mouth Qd 8)  Alprazolam 0.25 Mg  Tabs (Alprazolam) .Marland Kitchen.. 1--2 By Mouth Three Times A Day Prn 9)  Omeprazole 20 Mg Tbec (Omeprazole) .Marland Kitchen.. 1 By Mouth Once Daily  Allergies (verified): 1)  ! Codeine 2)  ! Zoloft  Review of Systems      See HPI  Physical Exam  General:  alert and well-developed.   Head:  NCAT, no TTP over sinuses Ears:  L outer ear red, swollen, warm to the touch + scab in crease of outer helix TM normal   Impression & Recommendations:  Problem # 1:  CELLULITIS AND ABSCESS OF UNSPECIFIED SITE (ICD-682.9) Assessment New obvious cellulitis of outer ear.  given recent MRSA exposure will start Doxy.  reviewed supportive care and red flags that should prompt return.  Pt expresses  understanding and is in agreement w/ this plan. Her updated medication list for this problem includes:    Doxycycline Hyclate 100 Mg Caps (Doxycycline hyclate) .Marland Kitchen... Take 1 tab twice a day.  take w/ food.  Complete Medication List: 1)  Bayer Aspirin Ec Low Dose 81 Mg Tbec (Aspirin) 2)  Simvastatin 20 Mg Tabs (Simvastatin) .... Take one tablet at bedtime 3)  Felodipine 10 Mg Tb24 (Felodipine) .... Take 1 tablet by mouth once a day - 4)  Celebrex 200 Mg Caps (Celecoxib) .... One p.o. daily 5)  Ultram 50 Mg Tabs (Tramadol hcl) .... One tablet by mouth three times a day as needed pain 6)  Flexeril 10 Mg Tabs (Cyclobenzaprine hcl) .... One by mouth twice a day as needed pain 7)  Citalopram Hydrobromide 20 Mg Tabs (Citalopram hydrobromide) .Marland Kitchen.. 1 by mouth qd 8)  Alprazolam 0.25 Mg Tabs (Alprazolam) .Marland Kitchen.. 1--2 by mouth three times a day prn 9)  Omeprazole 20 Mg Tbec (Omeprazole) .Marland Kitchen.. 1 by mouth once daily 10)  Doxycycline Hyclate 100 Mg Caps (Doxycycline hyclate) .... Take 1 tab twice a day.  take w/ food.  Patient Instructions: 1)  Follow up if no improvement or at any time worsening  2)  Ice the ear to help with the swelling 3)  Take the Doxycycline w/ food to avoid upset stomach 4)  Call with any questions or concerns 5)  Hang in there!!! Prescriptions: DOXYCYCLINE HYCLATE 100 MG CAPS (DOXYCYCLINE HYCLATE) Take 1 tab twice a day.  take w/ food.  #20 x 0   Entered and Authorized by:   Neena Rhymes MD   Signed by:   Neena Rhymes MD on 06/19/2010   Method used:   Electronically to        [id number] (retail)       [address]       [place], [place]  [address]       Ph: [phone] or [phone]       Fax: [phone]   RxID:   [id number]    Orders Added: 1)  Est. Patient Level III [50093]

## 2010-08-22 NOTE — Miscellaneous (Signed)
Summary: BONE DENSITY  Clinical Lists Changes  Orders: Added new Test order of T-Bone Densitometry (77080) - Signed Added new Test order of T-Lumbar Vertebral Assessment (77082) - Signed 

## 2010-08-22 NOTE — Progress Notes (Signed)
  Phone Note Outgoing Call   Call placed by: Doristine Devoid,  October 05, 2009 10:32 AM Call placed to: Patient Summary of Call: spoke with patient informed about medication to be taken before surgery.Marland KitchenMarland KitchenDoristine Devoid  October 05, 2009 10:33 AM     New/Updated Medications: METOPROLOL TARTRATE 25 MG TABS (METOPROLOL TARTRATE) take one tablet two times a day 2 days before surgery and 2 days after surgery Prescriptions: METOPROLOL TARTRATE 25 MG TABS (METOPROLOL TARTRATE) take one tablet two times a day 2 days before surgery and 2 days after surgery  #4 x 0   Entered by:   Doristine Devoid   Authorized by:   Nolon Rod. Paz MD   Signed by:   Doristine Devoid on 10/05/2009   Method used:   Electronically to        [id number] (retail)       [address]       [place], [place]  [address]       Ph: [phone] or [phone]       Fax: [phone]   RxID:   [id number]

## 2010-08-22 NOTE — Consult Note (Signed)
Summary: [facility]   Imported By: Lanelle Bal 10/11/2009 11:58:42  _____________________________________________________________________  External Attachment:    Type:   Image     Comment:   External Document

## 2010-08-22 NOTE — Miscellaneous (Signed)
Summary: [facility] Previsit/prep  Clinical Lists Changes  Medications: Added new medication of MOVIPREP 100 GM  SOLR (PEG-KCL-NACL-NASULF-NA ASC-C) As per prep instructions. - Signed Rx of MOVIPREP 100 GM  SOLR (PEG-KCL-NACL-NASULF-NA ASC-C) As per prep instructions.;  #1 x 0;  Signed;  Entered by: Wyona Almas RN;  Authorized by: Meryl Dare MD [facility];  Method used: Electronically to [facility] # [id number]*, [address] [place], [place], [place]  [address], Ph: [phone] or [phone], Fax: [phone] Allergies: Changed allergy or adverse reaction from CODEINE to CODEINE Observations: Added new observation of ALLERGY REV: Done (08/08/2009 9:45)    Prescriptions: MOVIPREP 100 GM  SOLR (PEG-KCL-NACL-NASULF-NA ASC-C) As per prep instructions.  #1 x 0   Entered by:   Wyona Almas RN   Authorized by:   Meryl Dare MD [facility]   Signed by:   Wyona Almas RN on 08/08/2009   Method used:   Electronically to        [id number] (retail)       [address]       [place], [place]  [address]       Ph: [phone] or [phone]       Fax: [phone]   RxID:   [id number]

## 2010-08-22 NOTE — Progress Notes (Signed)
Summary: XRAY OF HER SHOULDER  Phone Note Call from Patient Call back at [facility] Phone [phone]   Caller: Patient Summary of Call: [facility] IS CALLING WANTING TO HAVE AN X-RAY OF HER LEFT SHOULDER DONE. COULD YOU PLEASE REFERRAL HER FOR THIS. Initial call taken by: Freddy Jaksch,  August 10, 2009 11:10 AM  Follow-up for Phone Call        Why does patient need referral? We have to attach a Diagnosis code with every referral. This is Dr.Paz patient, when reason given by patient please forward to [facility] Follow-up by: Shonna Chock,  August 10, 2009 11:15 AM  Additional Follow-up for Phone Call Additional follow up Details #1::        Patient had surgery on her shoulder several years back.  She has a hx of bone spur.  She is now having shoulder pain & popping.  Scheduled ov for pt on Fri 1/21 with Sandford Craze, NP Shary Decamp  August 10, 2009 4:34 PM

## 2010-08-22 NOTE — Progress Notes (Signed)
  Phone Note From Other Clinic   Summary of Call: Received call from [place] at [facility].  Pt is having left shoulder surgery on 10/10/09. She is requesting most recent office visit, labs and EKG from any date as surgical clearance has already been given. Notes, labs and EKG faxed to [phone] attn: Huntley Dec.  Initial call taken by: Mervin Kung CMA,  September 30, 2009 12:12 PM

## 2010-08-22 NOTE — Procedures (Signed)
Summary: Colonoscopy  Patient: [name] Note: All result statuses are Final unless otherwise noted.  Tests: (1) Colonoscopy (COL)   COL Colonoscopy           DONE     [facility]     [address]     [place], [place]  [address]           COLONOSCOPY PROCEDURE REPORT           PATIENT:  [name], [name]  MR#:  [id number]     BIRTHDATE:  July 31, 1945, 63 yrs. old  GENDER:  female           ENDOSCOPIST:  Judie Petit T. Russella Dar, MD, [facility]           PROCEDURE DATE:  08/18/2009     PROCEDURE:  Colonoscopy 32951     ASA CLASS:  Class II     INDICATIONS:  1) follow-up of polyp  2) family Hx of polyps,     adenomatous polyp, 11/2002.           MEDICATIONS:   Fentanyl 75 mcg IV, Versed 10 mg IV           DESCRIPTION OF PROCEDURE:   After the risks benefits and     alternatives of the procedure were thoroughly explained, informed     consent was obtained.  Digital rectal exam was performed and     revealed no abnormalities.   The LB PCF-H180AL [id number] endoscope     was introduced through the anus and advanced to the cecum, which     was identified by both the appendix and ileocecal valve, without     limitations.  The quality of the prep was good, using MoviPrep.     The instrument was then slowly withdrawn as the colon was fully     examined.     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 It     was mild. A normal appearing cecum, ileocecal valve, and     appendiceal orifice were identified. The ascending, hepatic     flexure, splenic flexure, descending, sigmoid colon, and rectum     appeared unremarkable.   Retroflexed views in the rectum revealed     no abnormalities. The time to cecum =  2  minutes. The scope was     then withdrawn (time =  10  min) from the patient and the     procedure completed.           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     2) high fiber diet     3) Laxative modification           Zakiyah Diop T. Russella Dar, MD, [facility]           CC: Willow Ora, MD           n.     Rosalie DoctorVenita Lick. Raoul Ciano at 08/18/2009 10:41 AM           Kathrene Alu, [id number]  Note: An exclamation mark (!) indicates a result that was not dispersed into the flowsheet. Document  Creation Date: 08/18/2009 10:42 AM _______________________________________________________________________  (1) Order result status: Final Collection or observation date-time: 08/18/2009 10:37 Requested date-time:  Receipt date-time:  Reported date-time:  Referring Physician:   Ordering Physician: Claudette Head [phone]) Specimen Source:  Source: Launa Grill Order Number: [id number] Lab site:   Appended Document: Colonoscopy     Procedures Next Due Date:    Colonoscopy: 08/2014

## 2010-08-22 NOTE — Progress Notes (Signed)
Summary: ?surgical clearance  Phone Note Call from Patient Call back at [facility] Phone [phone]   Summary of Call: Needs surgical clearance for shoulder surgery (Dr. Ranell Patrick).  Had yearly 07/18/09.  Last EKG was 2009, pt states that she had cardiac cath about 1 1/2 year ago.  Ok to clear, ? need cardiac clearance, or ov here? Shary Decamp  September 09, 2009 12:01 PM   Follow-up for Phone Call        she is doing well, no history of CAD. patient is clear for surgery I do recommend use of metoprolol 25 mg b.i.d. two days before and two days after the surgery for cardiac protection.  please let surgery know Jariel Drost E. Venus Gilles MD  September 13, 2009 4:49 PM   unable to contact pt @ above number -- ?? fax machine??  clearance faxed to ortho Shary Decamp  September 13, 2009 4:59 PM

## 2010-08-22 NOTE — Assessment & Plan Note (Signed)
Summary: pain in left shoulder/swh   Vital Signs:  Patient profile:   65 year old female Weight:      184.4 pounds Pulse rate:   80 / minute BP sitting:   122 / 78  (left arm) Cuff size:   large  Vitals Entered By: Shonna Chock (August 12, 2009 11:45 AM) CC: Pain in left shoulder, patient had the same pain with this years back and had surgery about 5 years ago. Comments REVIEWED MED LIST, PATIENT AGREED DOSE AND INSTRUCTION CORRECT    CC:  Pain in left shoulder and patient had the same pain with this years back and had surgery about 5 years ago.Marland Kitchen  History of Present Illness: Ms Dore is a 65 year old female who presents today with c/o left shoulder pain.  Notes that she had shoulder injury 5 years ago following a fall.  She had surgery at that time to remove a bone chip from her shoulder.  Notes no problems following her surgery.   Pain stated 2 weeks ago- denies recurrent trauma.  She takes tylenol as needed and celebrex as needed- tese meds are not helping.  Allergies: 1)  ! Codeine 2)  ! Zoloft  Review of Systems       Denies fever.  Notes intermittent swelling of left shoulder, denies redness or warmth.    Physical Exam  General:  Well-developed,well-nourished,in no acute distress; alert,appropriate and cooperative throughout examination Msk:  + pain in L shoulder with movement.  Pain is greatest when she raises her left arm laterally.  No crepitus, no joint swelling or effusion   Impression & Recommendations:  Problem # 1:  SHOULDER PAIN, LEFT (ICD-719.41) Assessment Deteriorated  Reviewed old records, patien had shoulder surgery with Dr. Ranell Patrick 5 years ago on same shoulder.  Will refer patient back to Dr. Ranell Patrick for further evaluation.  Her updated medication list for this problem includes:    Celebrex 200 Mg Caps (Celecoxib) ..... One p.o. daily    Bayer Aspirin Ec Low Dose 81 Mg Tbec (Aspirin)    Ultram 50 Mg Tabs (Tramadol hcl) ..... One tablet by mouth three  times a day as needed pain  Orders: Orthopedic Referral (Ortho)  Complete Medication List: 1)  Felodipine 10 Mg Tb24 (Felodipine) .... Take 1 tablet by mouth once a day - 2)  Celebrex 200 Mg Caps (Celecoxib) .... One p.o. daily 3)  Bayer Aspirin Ec Low Dose 81 Mg Tbec (Aspirin) 4)  Citalopram Hydrobromide 20 Mg Tabs (Citalopram hydrobromide) .Marland Kitchen.. 1 by mouth qd 5)  Alprazolam 0.25 Mg Tabs (Alprazolam) .Marland Kitchen.. 1--2 by mouth three times a day prn 6)  Omeprazole 20 Mg Tbec (Omeprazole) .Marland Kitchen.. 1 by mouth once daily 7)  Vitamin D (ergocalciferol) 50000 Unit Caps (Ergocalciferol) .... Take one tablet weekly 8)  Simvastatin 20 Mg Tabs (Simvastatin) .... Take one tablet at bedtime 9)  Moviprep 100 Gm Solr (Peg-kcl-nacl-nasulf-na asc-c) .... As per prep instructions. 10)  Ultram 50 Mg Tabs (Tramadol hcl) .... One tablet by mouth three times a day as needed pain  Patient Instructions: 1)  We will arrange a follow up appointment with orthopedics. Prescriptions: ULTRAM 50 MG TABS (TRAMADOL HCL) one tablet by mouth three times a day as needed pain  #30 x 0   Entered and Authorized by:   Lemont Fillers FNP   Signed by:   Lemont Fillers FNP on 08/12/2009   Method used:   Electronically to  [facility]  [address] # [id number]* (retail)       [address]       [place], [place]  [address]       Ph: [phone] or [phone]       Fax: [phone]   RxID:   [id number]

## 2010-08-22 NOTE — Letter (Signed)
Summary: [facility]   Imported By: Lanelle Bal 09/27/2009 12:28:31  _____________________________________________________________________  External Attachment:    Type:   Image     Comment:   External Document

## 2010-08-22 NOTE — Progress Notes (Signed)
Summary: refill  Phone Note Refill Request Message from:  Fax from Pharmacy on April 13, 2010 10:38 AM  Refills Requested: Medication #1:  ALPRAZOLAM 0.25 MG  TABS 1--2 by mouth three times a day prn [facility]  Initial call taken by: Okey Regal [place],  April 13, 2010 10:39 AM  Follow-up for Phone Call        last ov- 07/18/2009, last seen 07/18/2009 cpx. Army Fossa CMA  April 13, 2010 10:52 AM   Additional Follow-up for Phone Call Additional follow up Details #1::        ok for #30, no refills. Additional Follow-up by: Neena Rhymes MD,  April 13, 2010 11:13 AM    Prescriptions: ALPRAZOLAM 0.25 MG  TABS (ALPRAZOLAM) 1--2 by mouth three times a day prn  #30 x 0   Entered by:   Army Fossa CMA   Authorized by:   Neena Rhymes MD   Signed by:   Army Fossa CMA on 04/13/2010   Method used:   Printed then faxed to ...       [facility]  [address] # [id number]* (retail)       [address]       [place], [place]  [address]       Ph: [phone] or [phone]       Fax: [phone]   RxID:   [id number]

## 2010-08-24 NOTE — Assessment & Plan Note (Signed)
Summary: FASTING 6 MONTH FOLLOWUP/KN   Vital Signs:  Patient profile:   65 year old female Weight:      181.38 pounds Pulse rate:   69 / minute Pulse rhythm:   regular BP sitting:   118 / 84  (left arm) Cuff size:   large  Vitals Entered By: Army Fossa CMA (July 12, 2010 9:00 AM) CC: 6 month f/u- fasting  Comments was put on Doxy for possible infection in ear, would like a refill still feels like theres something there .Marland KitchenMarland KitchenPossible MRSA? [facility] [address]    History of Present Illness: followup from the last ROV  As far as her cholesterol, she is taking simvastatin without apparent side effects  Was also seen with a ear cellulitis ("my ear  look like a cauliflower") , status post toxic, better but not completely well.  she is afraid of MRSA, she has visited a relative in the hospital with MRSA  Continue with back pain, saw  orthopedic surgery, they did an MRI of the back a few days ago. Has a followup with him July 25, 2010.   ROS No myalgias No nausea or vomiting No ear discharge  Current Medications (verified): 1)  Bayer Aspirin Ec Low Dose 81 Mg  Tbec (Aspirin) 2)  Simvastatin 20 Mg Tabs (Simvastatin) .... Take One Tablet At Bedtime 3)  Felodipine 10 Mg Tb24 (Felodipine) .... Take 1 Tablet By Mouth Once A Day - 4)  Celebrex 200 Mg  Caps (Celecoxib) .... One P.o. Daily 5)  Ultram 50 Mg Tabs (Tramadol Hcl) .... One Tablet By Mouth Three Times A Day As Needed Pain 6)  Flexeril 10 Mg Tabs (Cyclobenzaprine Hcl) .... One By Mouth Twice A Day As Needed Pain 7)  Citalopram Hydrobromide 20 Mg  Tabs (Citalopram Hydrobromide) .Marland Kitchen.. 1 By Mouth Qd 8)  Alprazolam 0.25 Mg  Tabs (Alprazolam) .Marland Kitchen.. 1--2 By Mouth Three Times A Day Prn 9)  Omeprazole 20 Mg Tbec (Omeprazole) .Marland Kitchen.. 1 By Mouth Once Daily  Allergies (verified): 1)  ! Codeine 2)  ! Zoloft  Past History:  Past Medical History: Anxiety--Depression Headache ECZEMA  HYPERTENSION   GOITER, MULTINODULAR     Dizziness, sever: MRI chronic isch. changes and  mastoiditis-saw Dr Lenard Forth (2007) Cath 5-09   A.  Revealing nonobstructive coronary artery disease with tubular,  discrete, 40% mid lesion in the circumflex; discrete 30% proximal lesion  in the LAD; luminal irregularities, 35% mid lesion in RCA; and generic  40% mid lesion in the RCA.  Medical treatment recommended.     Hyperlipidemia  Past Surgical History: Reviewed history from 07/18/2009 and no changes required. Cholecystectomy Hysterectomy, L oophorectomy L shoulder surgery aprox 2006  Social History: Reviewed history from 06/12/2010 and no changes required. Married 4 kids exercise : not recent exercise   tobacco-- quit aprox 2000  after 30 years  ETOH-- no  Physical Exam  General:  alert and well-developed.   Ears:  right ear normal Left ear: The ear itself is a slightly puffy and bigger than the right. No redness, no tenderness, no warmness. No scab present, she does have some scaliness in the   outer helix and posterior aspect  TM normal Lungs:  normal respiratory effort, no intercostal retractions, no accessory muscle use, and normal breath sounds.   Heart:  normal rate, regular rhythm, no murmur, and no gallop.     Impression & Recommendations:  Problem # 1:  CELLULITIS AND ABSCESS OF UNSPECIFIED SITE (ICD-682.9) resolving cellulitis  of the left ear with residual swelling  Recommend Doxy for 5 additional days Also recommend hydrocortisone cream for the scaliness (eczema?)  Her updated medication list for this problem includes:    Doxycycline Hyclate 100 Mg Caps (Doxycycline hyclate) ..... One by mouth twice a day for 5 days  Problem # 2:  HYPERLIPIDEMIA (ICD-272.4)  reports good compliance with simvastatin, labs Her updated medication list for this problem includes:    Simvastatin 20 Mg Tabs (Simvastatin) .Marland Kitchen... Take one tablet at bedtime  Labs Reviewed: SGOT: 22 (07/18/2009)   SGPT: 17 (07/18/2009)   HDL:39.80  (07/18/2009), 41.2 (05/09/2007)  LDL:DEL (05/09/2007)  Chol:209 (07/18/2009), 203 (05/09/2007)  Trig:195.0 (07/18/2009), 140 (05/09/2007)  Orders: Venipuncture (04540) TLB-ALT (SGPT) (84460-ALT) TLB-AST (SGOT) (84450-SGOT) TLB-Lipid Panel (80061-LIPID) Specimen Handling (98119)  Problem # 3:  BACK PAIN (ICD-724.5) status post ortho  evaluation. MRI done, report pending. Has a followup July 25, 2010 with orthopedic surgery Her updated medication list for this problem includes:    Bayer Aspirin Ec Low Dose 81 Mg Tbec (Aspirin)    Celebrex 200 Mg Caps (Celecoxib) ..... One p.o. daily    Ultram 50 Mg Tabs (Tramadol hcl) ..... One tablet by mouth three times a day as needed pain    Flexeril 10 Mg Tabs (Cyclobenzaprine hcl) ..... One by mouth twice a day as needed pain  Complete Medication List: 1)  Bayer Aspirin Ec Low Dose 81 Mg Tbec (Aspirin) 2)  Simvastatin 20 Mg Tabs (Simvastatin) .... Take one tablet at bedtime 3)  Felodipine 10 Mg Tb24 (Felodipine) .... Take 1 tablet by mouth once a day - 4)  Celebrex 200 Mg Caps (Celecoxib) .... One p.o. daily 5)  Ultram 50 Mg Tabs (Tramadol hcl) .... One tablet by mouth three times a day as needed pain 6)  Flexeril 10 Mg Tabs (Cyclobenzaprine hcl) .... One by mouth twice a day as needed pain 7)  Citalopram Hydrobromide 20 Mg Tabs (Citalopram hydrobromide) .Marland Kitchen.. 1 by mouth qd 8)  Alprazolam 0.25 Mg Tabs (Alprazolam) .Marland Kitchen.. 1--2 by mouth three times a day prn 9)  Omeprazole 20 Mg Tbec (Omeprazole) .Marland Kitchen.. 1 by mouth once daily 10)  Doxycycline Hyclate 100 Mg Caps (Doxycycline hyclate) .... One by mouth twice a day for 5 days 11)  Hydrocortisone 2.5 % Crea (Hydrocortisone) .... Applied twice a day to the left ear  Patient Instructions: 1)  Please schedule a follow-up appointment in 4 months, fasting, physical exam Prescriptions: HYDROCORTISONE 2.5 % CREA (HYDROCORTISONE) applied twice a day to the left ear  #1 x 0   Entered and Authorized by:   Nolon Rod.  Paz MD   Signed by:   Nolon Rod. Paz MD on 07/12/2010   Method used:   Print then Give to Patient   RxID:   [id number] DOXYCYCLINE HYCLATE 100 MG CAPS (DOXYCYCLINE HYCLATE) one by mouth twice a day for 5 days  #10 x 0   Entered and Authorized by:   Nolon Rod. Paz MD   Signed by:   Nolon Rod. Paz MD on 07/12/2010   Method used:   Print then Give to Patient   RxID:   [id number]    Orders Added: 1)  Venipuncture [01027] 2)  TLB-ALT (SGPT) [84460-ALT] 3)  TLB-AST (SGOT) [84450-SGOT] 4)  TLB-Lipid Panel [80061-LIPID] 5)  Specimen Handling [99000] 6)  Est. Patient Level III [25366]

## 2010-08-24 NOTE — Consult Note (Signed)
Summary: back pain, MRI ordered-[facility]  [facility]   Imported By: Lanelle Bal 07/04/2010 12:02:09  _____________________________________________________________________  External Attachment:    Type:   Image     Comment:   External Document

## 2010-08-24 NOTE — Letter (Signed)
Summary: back pain, had MRI, trial w/ shots,celebrex, ? surgery---Ortho   [facility]   Imported By: Lanelle Bal 08/01/2010 08:40:13  _____________________________________________________________________  External Attachment:    Type:   Image     Comment:   External Document

## 2010-08-24 NOTE — Progress Notes (Signed)
Summary: Felodipine alternative  Phone Note Refill Request Message from:  Patient on August 14, 2010 3:20 PM  Refills Requested: Medication #1:  FELODIPINE 10 MG TB24 Take 1 tablet by mouth once a day -   Notes: [facility] Patient called noting that she picked up the above med and it is no longer on her insurance plan. Patient notes that tier 1 on her insurance in Amlodipine, Tier 2 Cartia, Diltiazem, Nifediac. Felodipine is tier 3. Patient needs prescription changed before refill (she picked up 1 month supply).    Initial call taken by: Lucious Groves CMA,  August 14, 2010 3:20 PM  Follow-up for Phone Call        we can switch to amlodipine 10 mg one q. day. #30, 3 RF Patient to check her BP  3 times a week and call with readings in one month. call  if BP is more than 160/90 Follow-up by: Elita Quick E. Maxine Fredman MD,  August 14, 2010 4:09 PM  Additional Follow-up for Phone Call Additional follow up Details #1::        Patient notified and will finish her current prescription first. Additional Follow-up by: Lucious Groves CMA,  August 14, 2010 4:20 PM    New/Updated Medications: AMLODIPINE BESYLATE 10 MG TABS (AMLODIPINE BESYLATE) 1 by mouth once daily Prescriptions: AMLODIPINE BESYLATE 10 MG TABS (AMLODIPINE BESYLATE) 1 by mouth once daily  #30 x 3   Entered by:   Lucious Groves CMA   Authorized by:   Nolon Rod. Marquerite Forsman MD   Signed by:   Lucious Groves CMA on 08/14/2010   Method used:   Electronically to        [facility] # [facility]* (retail)       [address]       [place], [place]  [address]       Ph: [phone] or [phone]       Fax: [phone]   RxID:   [id number]

## 2010-08-24 NOTE — Op Note (Signed)
Summary: Lumbar Epidural Steroid Injection/[facility]  Lumbar Epidural Steroid Injection/[facility]   Imported By: Lanelle Bal 08/09/2010 12:41:08  _____________________________________________________________________  External Attachment:    Type:   Image     Comment:   External Document

## 2010-12-05 NOTE — Discharge Summary (Signed)
NAME:  [name], [name] NO.:  [id number]   MEDICAL RECORD NO.:  [id number]          PATIENT TYPE:  INP   LOCATION:  2007                         FACILITY:  [facility]   PHYSICIAN:  Veverly Fells. Excell Seltzer, MD  DATE OF BIRTH:  1946/04/11   DATE OF ADMISSION:  11/25/2007  DATE OF DISCHARGE:  11/26/2007                               DISCHARGE SUMMARY   PRIMARY CARDIOLOGIST:  Luis Abed, MD, [facility]   PRIMARY CARE PHYSICIAN:  Willow Ora, MD   PROCEDURES PERFORMED DURING THE HOSPITALIZATION:  Cardiac  catheterization:  A.  Revealing nonobstructive coronary artery disease with tubular,  discrete, 40% mid lesion in the circumflex; discrete 30% proximal lesion  in the LAD; luminal irregularities, 35% mid lesion in RCA; and generic  40% mid lesion in the RCA.  Medical treatment recommended.   FINAL DISCHARGE DIAGNOSES:  1. Chest pain, noncardiac.  2. Nonobstructive coronary artery disease.  A:  Status post cardiac catheterization completed by Dr. Tonny Bollman  on Nov 26, 2007.  1. Hypertension.  2. Anxiety and panic disorder.  3. History of depression.  4. History of thyroid goiter with normal thyroid-stimulating hormone      in November 2007, and no followup.   HOSPITAL COURSE:  This 65 year old Caucasian female was admitted to the  emergency room secondary to chest pain.  She is the mother of Elita Quick, one  of the nurses in the [facility] Cardiology Office.  The patient has history  of anxiety, depression, and panic attacks along with hypertension.  The  patient had burning chest discomfort when lying down when she went to  bed the night before admission requiring her to take extra doses of her  anti-anxiety medications.  The following morning, the patient had  recurrence of the symptoms again when she lied down.  She described it  as a burning pain with some radiation to the left arm.  As a result of  the continuing symptoms, the patient was seen in the emergency room  after  being brought there by her husband and at the insistence of her  daughter.  The patient was seen and examined by myself and Dr. Arvilla Meres in the emergency room, and was found to be stable.  EKG was  normal.  Initial cardiac enzymes were cycled and found to be negative.  As a result of the patient's discomfort, we discussed this with her  family and had elected to proceed with cardiac catheterization.  Cardiac  catheterization was completed by Dr. Tonny Bollman on Nov 26, 2007, with  results as discussed above.  Please see Dr. Earmon Phoenix thorough cardiac  catheterization transcription for more details.   The patient recovered well without evidence of hematoma, bleeding,  infection, or bruit.  We placed her on a low-dose statin, 40 mg of  simvastatin each day, and also, we started on aspirin 81 mg coated each  day along with her medications prior to admission.  She is to follow up  with Dr. Willa Rough in 1 month with lipids and LFTs in 6 weeks.  The  patient will also follow up  with her primary care physician as an  outpatient.   DISCHARGE LABORATORY DATA:  Hemoglobin 14.2, hematocrit 40.2, white  blood cells 8.2, and platelets 176.  Troponin 0.05 and 0.05  respectively.  Sodium 136, potassium 3.7, chloride 105, glucose 107, BUN  11, and creatinine 0.8.   DISCHARGE MEDICATIONS:  1. Protonix 40 mg daily.  2. Xanax 0.25 mg one-half tablet twice a day.  3. Felodipine ER 10 mg daily.  4. Estradiol 1 mg daily.  5. Celexa 20 mg daily.  6. Simvastatin 40 mg daily (new prescription).  7. Aspirin 81 mg daily, coated.   ALLERGIES:  1. PROZAC.  2. CODEINE.   FOLLOWUP PLANS AND APPOINTMENT:  1. The patient will see Dr. Willa Rough on December 29, 2007, at 9:45 a.m.  2. The patient will follow up with primary care physician, Dr. Drue Novel,      for continued medical management.  3. The patient will be given post cardiac catheterization instructions      with particular emphasis on the right  groin site for evidence of      bleeding, hematoma, signs of infection, or severe pain.   TIME SPENT WITH THE PATIENT TO INCLUDE PHYSICIAN TIME:  30 minutes.      Bettey Mare. Lyman Bishop, NP      Veverly Fells. Excell Seltzer, MD  Electronically Signed    KML/MEDQ  D:  11/26/2007  T:  11/27/2007  Job:  [id number]   cc:   Willow Ora, MD

## 2010-12-05 NOTE — H&P (Signed)
NAME:  [name], [name] NO.:  [id number]   MEDICAL RECORD NO.:  [id number]          PATIENT TYPE:  INP   LOCATION:  2007                         FACILITY:  [facility]   PHYSICIAN:  Bevelyn Buckles. Bensimhon, MDDATE OF BIRTH:  Feb 22, 1946   DATE OF ADMISSION:  11/25/2007  DATE OF DISCHARGE:                              HISTORY & PHYSICAL   PRIMARY CARDIOLOGIST:  Arvilla Meres, MD.   PRIMARY CARE PHYSICIAN:  Willow Ora, MD.   HISTORY OF PRESENT ILLNESS:  This is a 65 year old Caucasian female who  is admitted to the ER secondary to chest pain.  The patient has a past  medical history of anxiety, depression, and panic attacks along with  hypertension and thyroid goiter in the past with a normal TSH.  The  patient states last night she took a nerve pill to allow her to sleep  because she was feeling ancy.  When she laid down, she felt a burning  feeling in her chest substernally with some radiation to the left arm  with numbness and tingling in the left arm, lasted approximately 2  hours.  Approximately 30 minutes after the first Xanax, which she took  originally, she took a second Xanax.  The burning and the discomfort  lasted approximately 2 hours from 11:00 p.m. to 1:00 p.m., and she said  that she fell asleep after that.  The next morning, which was the day of  admission, the patient states that she felt funny all day, felt chest  burning each time she had laid down with generalized weakness.  She laid  around the house.  Her brother came over.  She played cards with him and  began to feel worse, and laid down again.  The burning in her chest  returned.  She called her daughter in the office as her daughter is an  Research officer, political party [employer], and her husband brought her to the  emergency room.  The patient had also been in contact with her primary  care physician, Dr. Drue Novel, secondary to these symptoms who suggested that  she increase her Celexa to 30 mg a day.  The patient  states that she was  on her way to the emergency room and did not increase her Celexa.   REVIEW OF SYSTEMS:  Positive for chest pain and burning,  lightheadedness, feeling like she was leaving her body, numbness and  tingling in her left arm, and anxiety.   PAST MEDICAL HISTORY:  1. Hypertension.  2. Anxiety.  3. Panic disorder.  4. Depression.  5. Thyroid goiter with a normal TSH in November 2007.   PAST CARDIAC HISTORY:  She states that she had a stress test and an EKG  done in the 90s, but no follow up at that time.   SOCIAL HISTORY:  The patient lives in [place] with her husband.  She  is retired.  She has 2 children.  She was a 25-pack-year smoker, but  quit 8 years ago.  Denies use of alcohol or use of drugs.   FAMILY HISTORY:  Mother died of cancer but also had a history  of CAD and  need for CABG but did not undergo surgery secondary to terminal cancer.  Father is uncertain.  She has 1 brother with CAD and 2 half brothers  with CAD; all 3 have had coronary artery bypass grafting.   CURRENT MEDICATIONS AT HOME:  1. Xanax 0.25 mg twice a day and more often p.r.n.  2. Felodipine ER 10 mg daily.  3. Celexa 20 mg daily.  4. Estradiol 1 mg daily.   ALLERGIES:  CODEINE and PROZAC.   CURRENT LABS:  Hemoglobin 14.0, hematocrit 40.2, white blood cells 8.2,  and platelets 176.  Sodium 136, potassium 3.7, chloride 105, BUN 11,  creatinine 0.8, and glucose 107.  Cardiac enzymes are pending.  Chest x-  ray is pending.  EKG revealing sinus bradycardia with a ventricular rate  in the 60s with nonspecific T-wave abnormalities noted laterally.   PHYSICAL EXAMINATION:  VITAL SIGNS:  Blood pressure 154/51, pulse 85,  respirations 20, temperature 98.4, O2 sat 97% on room air.  GENERAL:  She is awake, alert, and oriented.  No complaints at present.  HEENT:  Head is normocephalic and atraumatic.  Eyes:  PERRLA.  NECK:  Neck is supple.  No JVD.  No carotid bruits appreciated.   CARDIOVASCULAR:  Regular rate and rhythm without murmurs, rubs, or  gallops.  LUNGS:  Clear to auscultation.  ABDOMEN:  Soft, nontender, 2+ bowel sounds.  EXTREMITIES:  Without clubbing, cyanosis, or edema.  NEURO:  Cranial nerves II-XII are grossly intact.   IMPRESSION:  1. Chest pain with typical and atypical features.  2. Suspicious for gastroesophageal reflux disease.  3. Anxiety and depression.   PLAN:  The patient has been seen and examined by myself and Dr. Arvilla Meres in the emergency room.  The patient has typical and atypical  features.  EKG is normal.  She does have a significant anxiety component  with frequent use of antianxiety medications.  The patient will be  scheduled for cardiac catheterization as she does have cardiovascular  risk factors with family history of smoking and hypertension.  We will  cycle cardiac enzymes, check lipids and LFTs, and monitor overnight.  The patient has had cardiac catheterization.  I explained to her with  risks and benefits, and the patient agrees to proceed.  We will make  further recommendations throughout the hospital course.      Bettey Mare. Lyman Bishop, NP      Bevelyn Buckles. Bensimhon, MD  Electronically Signed   KML/MEDQ  D:  11/25/2007  T:  11/26/2007  Job:  [id number]   cc:   Willow Ora, MD

## 2010-12-08 NOTE — Consult Note (Signed)
[facility]                            ENDOCRINOLOGY CONSULTATION   [name], [name]                    MRN:          [id number]  DATE:05/23/2006                            DOB:          [birth year]    REFERRING PHYSICIAN:  Willow Ora, MD   REASON FOR REFERRAL:  Goiter.   HISTORY OF PRESENT ILLNESS:  A 65 year old woman who states that as a child  she had an uncertain type of thyroid condition.  She states she was treated  with a medication for a brief period of time but does not know what it was.  She states that earlier this year she had an MRI of her brain for dizziness  and that a goiter was incidentally noted.  A followup ultrasound showed a  multinodular goiter.  A followup ultrasound of that in September 2007 showed  the goiter was unchanged.  Symptomatically, she has 1 year of intermittent  moderate nonvertiginous quality dizziness and some associated dry skin.   PAST MEDICAL HISTORY:  1. Anxiety/depression.  2. Menopause.   SOCIAL HISTORY:  She is retired.  She is married.   FAMILY HISTORY:  Negative for thyroid disease.   REVIEW OF SYSTEMS:  She has gained 40 pounds over the past 3 years, since  she quit smoking.  She denies any difficulty swallowing.   PHYSICAL EXAMINATION:  VITAL SIGNS:  Blood pressure 120/79, heart rate is  82, temperature is 98.3, the weight is 188.  GENERAL:  No distress.  EYES:  No proptosis.  NECK:  No goiter.  SKIN:  No rash, not diaphoretic.  NEUROLOGIC:  Alert, oriented.  Does not appear anxious nor depressed.   LABORATORY STUDIES:  On November 13, 2005, TSH 1.52.  On August 30, 2005, TSH  1.88.  On April 04, 2006, she has a small multinodular goiter, unchanged  from October 15, 2005.   IMPRESSION:  1. 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  2. Euthyroid.  3. Symptom complex as noted above, not thyroid related in view of her      normal TSH.   PLAN:  1. We discussed the  natural history of a multinodular goiter and I told      her I do not think she needs a biopsy at this stage.  However, I have      told her that the most common natural      history of a multinodular goiter is the slow development of      hyperthyroidism.  2. Return in about a year.    ______________________________  Cleophas Dunker. Everardo All, MD    SAE/MedQ  DD: 05/26/2006  DT: 05/27/2006  Job #: [id number]   cc:   Willow Ora, MD

## 2010-12-20 ENCOUNTER — Encounter: Payer: Self-pay | Admitting: Internal Medicine

## 2010-12-20 ENCOUNTER — Ambulatory Visit (INDEPENDENT_AMBULATORY_CARE_PROVIDER_SITE_OTHER): Payer: Medicare Other | Admitting: Internal Medicine

## 2010-12-20 DIAGNOSIS — Z23 Encounter for immunization: Secondary | ICD-10-CM

## 2010-12-20 DIAGNOSIS — F341 Dysthymic disorder: Secondary | ICD-10-CM

## 2010-12-20 DIAGNOSIS — E785 Hyperlipidemia, unspecified: Secondary | ICD-10-CM

## 2010-12-20 DIAGNOSIS — E042 Nontoxic multinodular goiter: Secondary | ICD-10-CM

## 2010-12-20 DIAGNOSIS — I10 Essential (primary) hypertension: Secondary | ICD-10-CM

## 2010-12-20 DIAGNOSIS — M549 Dorsalgia, unspecified: Secondary | ICD-10-CM

## 2010-12-20 DIAGNOSIS — Z0000 Encounter for general adult medical examination without abnormal findings: Secondary | ICD-10-CM | POA: Insufficient documentation

## 2010-12-20 LAB — TSH: TSH: 2.41 u[IU]/mL (ref 0.35–5.50)

## 2010-12-20 LAB — LIPID PANEL
HDL: 46 mg/dL (ref >39.00)
LDL Cholesterol: 68 mg/dL (ref 0–99)
Total CHOL/HDL Ratio: 3
VLDL: 34.4 mg/dL (ref 0.0–40.0)

## 2010-12-20 LAB — CBC WITH DIFFERENTIAL/PLATELET
Eosinophils Relative: 0.7 % (ref 0.0–5.0)
Lymphocytes Relative: 38.1 % (ref 12.0–46.0)
MCV: 96.6 fl (ref 78.0–100.0)
Monocytes Absolute: 0.5 10*3/uL (ref 0.1–1.0)
Neutrophils Relative %: 53.6 % (ref 43.0–77.0)
Platelets: 210 10*3/uL (ref 150.0–400.0)
WBC: 7.3 10*3/uL (ref 4.5–10.5)

## 2010-12-20 LAB — BASIC METABOLIC PANEL
GFR: 88.73 mL/min (ref >60.00)
Glucose, Bld: 105 mg/dL — ABNORMAL HIGH (ref 70–99)
Potassium: 5.2 mEq/L — ABNORMAL HIGH (ref 3.5–5.1)
Sodium: 143 mEq/L (ref 135–145)

## 2010-12-20 NOTE — Assessment & Plan Note (Signed)
Controlled w/ present medicines

## 2010-12-20 NOTE — Assessment & Plan Note (Signed)
Long history of goiter, had a ultrasound in 2008 and the last ultrasound 10-22-2007: 1.  Stable small dominant solid nodule lower pole left thyroid consistent with likely benign etiology, such as adenoma. 2.  No new significant abnormality noted.

## 2010-12-20 NOTE — Assessment & Plan Note (Signed)
Ongoing problem, status post to local injections, followup by orthopedic surgery. Considering surgery?

## 2010-12-20 NOTE — Assessment & Plan Note (Signed)
Good compliance with medicine, has a strong family history of heart disease, we need her LDL to be less than 100. Labs

## 2010-12-20 NOTE — Assessment & Plan Note (Addendum)
Was switched from felodipines to amlodipine d/t cost, reports occasional edema, may be related to the new blood pressure medication. Recommend observation. EKG at baseline, low QRS voltages which have been seen for years

## 2010-12-20 NOTE — Assessment & Plan Note (Addendum)
Td 2002 and 2012  pneumonia shot 2010 Shingles shot info provided  Sees gyn  MMG  7-11 neg Last colonoscopy  07-2009, next 2016 DEXA 07-2009 neg   Unable to exercise much, due to back pain. Diet discussed

## 2010-12-20 NOTE — Progress Notes (Signed)
Subjective:    Patient ID: [name], female    DOB: May 07, 1946, 65 y.o.   MRN: [id number]  HPI  Here for Medicare AWV:  1. Risk factors based on Past M, S, F history: reviewed 2. Physical Activities:  Not very active d/t back pain, just doing home chores  3. Depression/mood:  Sx well controlled w/ meds  4. Hearing: no problems noted or reported  5. ADL's:  Totally independent  6. Fall Risk: no recent falls 7. home Safety: does feelsafe at home  8. Height, weight, &visual acuity: see VS, vision corrected w/ glasses, has cataracts , sees eye MD regularly  9. Counseling: provided 10. Labs ordered based on risk factors: if needed  11. Referral Coordination: if needed 12.  Care Plan, see assessment and plan  13.   Cognitive Assessment: cognition and motor skills wnl  In addition, today we discussed the following: Still has back pain, s/p 2 local injections, considering surgery Cholesterol-- on meds , good compliance, no s/e than she can tell; occ tingling in the LE, thinks related to back  HTN-- was on felodipine, now on amlodipine since 07-2010 , good readings , mild edema  Past Medical History  Diagnosis Date  . Anxiety and depression   . Headache   . Eczema   . Hypertension   . Goiter   . Dizziness     severe: MRI chronic isch. changes and mastoiditis- saw Dr.Mundy(2007)  . S/P cardiac cath 2009    Revealing nonobstructive CAD w/ tubular, discrete 40% mid lesion in the circumflex; discrete 30% proximal lesion in the LAD; luminal irregularities, 35% mid lesion in RCA; and generic 40% mid lesion in the RCA. Medical treatment recommended.  . Hyperlipidemia    Past Surgical History  Procedure Date  . Cholecystectomy   . Abdominal hysterectomy     oophorectomy L only  . Shoulder surgery 2006    left      Family History: colon ca--no breast ca-- 2 cousins pancreas ca-- uncle throat ca--cousin MI--several family members including his 50y/o son lung ca--mother,  cousin  Social History: Married 4 kids exercise : not recent exercise  tobacco-- quit aprox 2000  after 30 years  ETOH-- no  Coke-- several a day   Review of Systems  Constitutional: Negative for fever and unexpected weight change.  Respiratory: Negative for cough, shortness of breath and wheezing.   Cardiovascular: Negative for chest pain. Leg swelling: some swelling in legs B, worse w/ standing.  Gastrointestinal: Negative for abdominal pain and blood in stool.       Objective:   Physical Exam  Constitutional: She is oriented to person, place, and time. She appears well-developed. No distress.  HENT:  Head: Normocephalic and atraumatic.  Neck: No thyromegaly present.       Normal carotid pulses  Cardiovascular: Normal rate, regular rhythm and normal heart sounds.   No murmur heard. Pulmonary/Chest: Effort normal and breath sounds normal. No respiratory distress. She has no wheezes. She has no rales.  Abdominal: Soft. Bowel sounds are normal. She exhibits no distension. There is no tenderness. There is no rebound and no guarding.  Musculoskeletal: She exhibits no edema.  Neurological: She is alert and oriented to person, place, and time.  Skin: Skin is warm and dry. She is not diaphoretic.  Psychiatric: She has a normal mood and affect. Her behavior is normal. Judgment and thought content normal.          Assessment & Plan:

## 2010-12-22 ENCOUNTER — Telehealth: Payer: Self-pay | Admitting: *Deleted

## 2010-12-22 NOTE — Telephone Encounter (Signed)
Message copied by Leanne Lovely on Fri Dec 22, 2010  2:13 PM ------      Message from: Willow Ora E      Created: Fri Dec 22, 2010  1:06 PM       Advise patient:      Cholesterol well controlled except the triglycerides are slightly elevated, continue same medication and watch diet.      Potassium is slightly high, send a low potassium diet      Sugar has been slightly elevated, please check a hemoglobin A1c--- if unable to add, will get at the time of next visit

## 2010-12-22 NOTE — Telephone Encounter (Signed)
Message left for patient to return my call.  

## 2010-12-25 NOTE — Telephone Encounter (Signed)
Message left for patient to return my call.  

## 2010-12-26 NOTE — Telephone Encounter (Signed)
Message left for patient to return my call.  

## 2010-12-27 ENCOUNTER — Encounter: Payer: Self-pay | Admitting: *Deleted

## 2010-12-27 NOTE — Telephone Encounter (Signed)
Will mail pt a letter and diet. A1c- unable to be added.

## 2011-01-08 ENCOUNTER — Other Ambulatory Visit: Payer: Self-pay | Admitting: Internal Medicine

## 2011-02-06 ENCOUNTER — Other Ambulatory Visit: Payer: Self-pay | Admitting: Internal Medicine

## 2011-02-16 ENCOUNTER — Ambulatory Visit (INDEPENDENT_AMBULATORY_CARE_PROVIDER_SITE_OTHER): Payer: Medicare Other | Admitting: Internal Medicine

## 2011-02-16 ENCOUNTER — Encounter: Payer: Self-pay | Admitting: Internal Medicine

## 2011-02-16 VITALS — BP 136/88 | HR 93 | Temp 98.1°F | Wt 187.4 lb

## 2011-02-16 DIAGNOSIS — H9209 Otalgia, unspecified ear: Secondary | ICD-10-CM | POA: Insufficient documentation

## 2011-02-16 DIAGNOSIS — R739 Hyperglycemia, unspecified: Secondary | ICD-10-CM | POA: Insufficient documentation

## 2011-02-16 DIAGNOSIS — R7309 Other abnormal glucose: Secondary | ICD-10-CM

## 2011-02-16 LAB — HEMOGLOBIN A1C: Hgb A1c MFr Bld: 6.2 % (ref 4.6–6.5)

## 2011-02-16 MED ORDER — NEOMYCIN-POLYMYXIN-HC 3.5-10000-1 OT SOLN: 3.0000 [drp] | Freq: Three times a day (TID) | OTIC | Status: AC

## 2011-02-16 NOTE — Progress Notes (Signed)
  Subjective:    Patient ID: [name], female    DOB: 1946/02/12, 65 y.o.   MRN: [id number]  HPI Left ear pain for a few days, some itching. Mild dizziness and tinnitus with head motion.   Past Medical History  Diagnosis Date  . Anxiety and depression   . Headache   . Eczema   . Hypertension   . Goiter   . Dizziness     severe: MRI chronic isch. changes and mastoiditis- saw Dr.Mundy(2007)  . S/P cardiac cath 2009    Revealing nonobstructive CAD w/ tubular, discrete 40% mid lesion in the circumflex; discrete 30% proximal lesion in the LAD; luminal irregularities, 35% mid lesion in RCA; and generic 40% mid lesion in the RCA. Medical treatment recommended.  . Hyperlipidemia    Past Surgical History  Procedure Date  . Cholecystectomy   . Abdominal hysterectomy     oophorectomy L only  . Shoulder surgery 2006    left       Review of Systems No fever, no ear discharge. Normal right nose, sore throat or recent cold.    Objective:   Physical Exam  Constitutional: She appears well-developed and well-nourished. No distress.  HENT:  Head: Normocephalic and atraumatic.  Nose: Nose normal.  Mouth/Throat: No oropharyngeal exudate.       Right ear normal Left ear: Canal without discharge, slightly red?; tympanic membrane normal, slightly positive trago sign? TMJs w/o click. Areas surrounding the left ear normal to inspection and palpation  Neck: Normal range of motion. Neck supple.  Lymphadenopathy:    She has no cervical adenopathy.  Skin: She is not diaphoretic.          Assessment & Plan:   Otalgia Otalgia, early/mild otitis externa. Will Rx eardrops, see prescription. Patient will call if no better.  Hyperglycemia Last blood sugar slightly elevated, we'll check a hemoglobin A1c

## 2011-02-16 NOTE — Assessment & Plan Note (Signed)
Otalgia, early/mild otitis externa. Will Rx eardrops, see prescription. Patient will call if no better.

## 2011-02-16 NOTE — Assessment & Plan Note (Signed)
Last blood sugar slightly elevated, we'll check a hemoglobin A1c

## 2011-02-16 NOTE — Patient Instructions (Signed)
Use the ear drops x 3 days only, call if no better soon

## 2011-02-20 ENCOUNTER — Telehealth: Payer: Self-pay | Admitting: *Deleted

## 2011-02-20 NOTE — Telephone Encounter (Signed)
Pt is aware.  

## 2011-02-20 NOTE — Telephone Encounter (Signed)
Message copied by Leanne Lovely on Tue Feb 20, 2011 10:11 AM ------      Message from: [name]      Created: Tue Feb 20, 2011  8:11 AM       Advise patient:      Has developed borderline DM      Treatment is a healthy diet, stay active, arrange a nutritionist referal      Recheck sugar on RTC

## 2011-05-29 ENCOUNTER — Telehealth: Payer: Self-pay | Admitting: Internal Medicine

## 2011-05-30 NOTE — Telephone Encounter (Signed)
Last fill date for xanax and citalopram 06/12/10. Pt does need to schedule 6 month f/u this month. Last f/u appt 12/20/10

## 2011-05-31 MED ORDER — AMLODIPINE BESYLATE 10 MG PO TABS: 10.0000 mg | ORAL_TABLET | Freq: Every day | ORAL | Status: DC

## 2011-05-31 MED ORDER — CITALOPRAM HYDROBROMIDE 20 MG PO TABS: 20.0000 mg | ORAL_TABLET | Freq: Every day | ORAL | Status: DC

## 2011-05-31 MED ORDER — ALPRAZOLAM 0.25 MG PO TABS: ORAL_TABLET | ORAL | Status: DC

## 2011-05-31 MED ORDER — OMEPRAZOLE 20 MG PO CPDR: 20.0000 mg | DELAYED_RELEASE_CAPSULE | Freq: Every day | ORAL | Status: DC

## 2011-05-31 MED ORDER — SIMVASTATIN 20 MG PO TABS: 20.0000 mg | ORAL_TABLET | Freq: Every day | ORAL | Status: DC

## 2011-05-31 NOTE — Telephone Encounter (Signed)
Rx's filled

## 2011-05-31 NOTE — Telephone Encounter (Signed)
Xanax #60, 1 RF Citalopram #30, 3 Rf

## 2011-06-01 NOTE — Telephone Encounter (Signed)
Rx requests completed and handicapped disability form ready for pt to pick up.  Pt's husband aware

## 2011-10-08 ENCOUNTER — Other Ambulatory Visit: Payer: Self-pay

## 2011-10-08 MED ORDER — OMEPRAZOLE 20 MG PO CPDR: 20.0000 mg | DELAYED_RELEASE_CAPSULE | Freq: Every day | ORAL | Status: DC

## 2011-10-08 MED ORDER — CITALOPRAM HYDROBROMIDE 20 MG PO TABS: 20.0000 mg | ORAL_TABLET | Freq: Every day | ORAL | Status: DC

## 2011-10-08 MED ORDER — SIMVASTATIN 20 MG PO TABS: 20.0000 mg | ORAL_TABLET | Freq: Every day | ORAL | Status: DC

## 2011-10-08 MED ORDER — AMLODIPINE BESYLATE 10 MG PO TABS: 10.0000 mg | ORAL_TABLET | Freq: Every day | ORAL | Status: DC

## 2011-10-08 NOTE — Telephone Encounter (Signed)
Call from patient requesting refills-- Last seen 02/16/11  And labs done 12/22/10. Please advise    KP

## 2011-10-08 NOTE — Telephone Encounter (Signed)
Refill done.  

## 2011-10-24 ENCOUNTER — Ambulatory Visit (INDEPENDENT_AMBULATORY_CARE_PROVIDER_SITE_OTHER): Payer: Medicare Other | Admitting: Family Medicine

## 2011-10-24 ENCOUNTER — Telehealth: Payer: Self-pay | Admitting: Internal Medicine

## 2011-10-24 VITALS — BP 124/76 | HR 85 | Temp 98.3°F | Resp 16 | Ht 60.5 in | Wt 186.2 lb

## 2011-10-24 DIAGNOSIS — R21 Rash and other nonspecific skin eruption: Secondary | ICD-10-CM

## 2011-10-24 DIAGNOSIS — G501 Atypical facial pain: Secondary | ICD-10-CM

## 2011-10-24 NOTE — Telephone Encounter (Signed)
Pt/[facility], calling for 3 sores to nose. Nose is red and painful, getting redder, more sore and has pus present. Unsure of fever. All emergent sxs per Skin Lesions protocol r/o. See within 4 hours disp, spoke with Kim/office, no more available appts today. Advised UC per disp.

## 2011-10-24 NOTE — Progress Notes (Signed)
[facility]:  Office Visit  Chief Complaint:  Chief Complaint  Patient presents with  . Nose Problem    x   4 days lesion    HPI: [name] is a 66 y.o. female who complains of nose lesion x 4 day history. Initially started out on tip of nose and was associated with blister and she popped it with her fingers. There was pus. Had burning pain more on right side of face than left.  No fevers, chills. No vision changes that are acute. Denies HA.   She has had MRSA.   Past Medical History  Diagnosis Date  . Anxiety and depression   . Headache   . Eczema   . Hypertension   . Goiter   . Dizziness     severe: MRI chronic isch. changes and mastoiditis- saw Dr.Mundy(2007)  . S/P cardiac cath 2009    Revealing nonobstructive CAD w/ tubular, discrete 40% mid lesion in the circumflex; discrete 30% proximal lesion in the LAD; luminal irregularities, 35% mid lesion in RCA; and generic 40% mid lesion in the RCA. Medical treatment recommended.  . Hyperlipidemia    Past Surgical History  Procedure Date  . Cholecystectomy   . Abdominal hysterectomy     oophorectomy L only  . Shoulder surgery 2006    left    History   Social History  . Marital Status: Married    Spouse Name: N/A    Number of Children: 4  . Years of Education: N/A   Social History Main Topics  . Smoking status: Former Games developer  . Smokeless tobacco: None  . Alcohol Use: No  . Drug Use: None  . Sexually Active: None   Other Topics Concern  . None   Social History Narrative  . None   Family History  Problem Relation Age of Onset  . Colon cancer Neg Hx   . Breast cancer      2 cousins  . Throat cancer      cousin  . Heart attack      several family memers  . Lung cancer Mother     cousin   Allergies  Allergen Reactions  . Codeine     REACTION: chest and stomach pain  . Sertraline Hcl     REACTION: muscle aches, diarrhea, nausea   Prior to Admission medications     Medication Sig Start Date End Date Taking? Authorizing Provider  ALPRAZolam Prudy Feeler) 0.25 MG tablet 1-2 by mouth three times a day prn. 05/31/11  Yes Wanda Plump, MD  amLODipine (NORVASC) 10 MG tablet Take 1 tablet (10 mg total) by mouth daily. 10/08/11  Yes Wanda Plump, MD  aspirin 81 MG tablet Take 81 mg by mouth daily.     Yes Historical Provider, MD  celecoxib (CELEBREX) 200 MG capsule Take 200 mg by mouth as needed.    Yes Historical Provider, MD  citalopram (CELEXA) 20 MG tablet Take 1 tablet (20 mg total) by mouth daily. 10/08/11  Yes Wanda Plump, MD  cyclobenzaprine (FLEXERIL) 10 MG tablet Take 10 mg by mouth 2 (two) times daily as needed.     Yes Historical Provider, MD  hydrocortisone 2.5 % cream Apply topically 2 (two) times daily.     Yes Historical Provider, MD  omeprazole (PRILOSEC) 20 MG capsule Take 1 capsule (20 mg total) by mouth daily. 10/08/11  Yes Wanda Plump, MD  simvastatin (ZOCOR) 20 MG tablet Take 1 tablet (  20 mg total) by mouth at bedtime. 10/08/11  Yes Wanda Plump, MD  traMADol (ULTRAM) 50 MG tablet Take 50 mg by mouth 3 (three) times daily as needed.     Yes Historical Provider, MD     ROS: The patient denies fevers, chills, night sweats, unintentional weight loss, chest pain, palpitations, wheezing, dyspnea on exertion, nausea, vomiting, abdominal pain, dysuria, hematuria, melena, numbness, weakness, or tingling. + rash, burning pain on right side of face  All other systems have been reviewed and were otherwise negative with the exception of those mentioned in the HPI and as above.    PHYSICAL EXAM: Filed Vitals:   10/24/11 1546  BP: 124/76  Pulse: 85  Temp: 98.3 F (36.8 C)  Resp: 16   Filed Vitals:   10/24/11 1546  Height: 5' 0.5" (1.537 m)  Weight: 186 lb 3.2 oz (84.46 kg)   Body mass index is 35.77 kg/(m^2).  General: Alert, no acute distress HEENT:  Normocephalic, atraumatic, oropharynx patent.  Cardiovascular:  Regular rate and rhythm, no rubs murmurs or  gallops.  No Carotid bruits, radial pulse intact. No pedal edema.  Respiratory: Clear to auscultation bilaterally.  No wheezes, rales, or rhonchi.  No cyanosis, no use of accessory musculature GI: No organomegaly, abdomen is soft and non-tender, positive bowel sounds.  No masses. Skin: + erythematous blister on tip of nose and inside right nares. Neurologic: Facial musculature symmetric. Psychiatric: Patient is appropriate throughout our interaction. Lymphatic: No cervical lymphadenopathy Musculoskeletal: Gait intact.   LABS: Results for orders placed in visit on 02/16/11  HEMOGLOBIN A1C      Component Value Range   Hemoglobin A1C 6.2  4.6 - 6.5 (%)     EKG/XRAY:   Primary read interpreted by Dr. Conley Rolls at [facility].   ASSESSMENT/PLAN: Encounter Diagnosis  Name Primary?  . Rash Yes   Will treat with Valtrex and also Doxyccyline. Presumptive txt for Shingles since mostly unilateral on right side of nose, however, she popped the blisters and now have erythematous, warm wound on nose with possible skin infection. Unable to cx since no drainage.  Will f/u prn via phone. She will come in if rash spreads or pain increases.  Hamilton Capri PHUONG, DO 10/24/2011 5:17 PM

## 2011-10-25 ENCOUNTER — Telehealth: Payer: Self-pay | Admitting: Family Medicine

## 2011-10-25 MED ORDER — VALACYCLOVIR HCL 1 G PO TABS: 1000.0000 mg | ORAL_TABLET | Freq: Three times a day (TID) | ORAL | Status: AC

## 2011-10-25 MED ORDER — DOXYCYCLINE HYCLATE 100 MG PO TABS: 100.0000 mg | ORAL_TABLET | Freq: Two times a day (BID) | ORAL | Status: AC

## 2011-10-25 NOTE — Telephone Encounter (Signed)
Spoke with husband, rash is the same. NO pain. Patient sleeping. Will come in as needed

## 2011-10-29 NOTE — Telephone Encounter (Signed)
Noted  

## 2011-12-24 ENCOUNTER — Telehealth: Payer: Self-pay | Admitting: *Deleted

## 2011-12-24 NOTE — Telephone Encounter (Signed)
Error

## 2011-12-31 ENCOUNTER — Encounter: Payer: Self-pay | Admitting: *Deleted

## 2012-02-12 ENCOUNTER — Other Ambulatory Visit: Payer: Self-pay | Admitting: Internal Medicine

## 2012-02-12 NOTE — Telephone Encounter (Signed)
Refill done. Pt is due for an office visit.

## 2012-03-31 ENCOUNTER — Other Ambulatory Visit: Payer: Self-pay | Admitting: Internal Medicine

## 2012-03-31 NOTE — Telephone Encounter (Signed)
Refill done.  

## 2012-03-31 NOTE — Telephone Encounter (Signed)
Ok #30, no refills. No further refills without office visit, let patient know

## 2012-03-31 NOTE — Telephone Encounter (Signed)
Pt has not been seen within a year. OK to refill? 

## 2012-04-24 ENCOUNTER — Other Ambulatory Visit: Payer: Self-pay | Admitting: Internal Medicine

## 2012-04-24 NOTE — Telephone Encounter (Signed)
Pt has not been seen within a year. OK to refill? 

## 2012-04-25 NOTE — Telephone Encounter (Signed)
Advise patient: Will call #30, no further RF  without office visit

## 2012-04-25 NOTE — Telephone Encounter (Signed)
Refill done.  

## 2012-05-19 ENCOUNTER — Other Ambulatory Visit: Payer: Self-pay | Admitting: Internal Medicine

## 2012-05-19 NOTE — Telephone Encounter (Signed)
Refill done.  

## 2012-06-10 ENCOUNTER — Other Ambulatory Visit: Payer: Self-pay | Admitting: Internal Medicine

## 2012-06-10 NOTE — Telephone Encounter (Signed)
Refill done.  

## 2012-07-04 ENCOUNTER — Other Ambulatory Visit: Payer: Self-pay | Admitting: Specialist

## 2012-07-04 DIAGNOSIS — M5136 Other intervertebral disc degeneration, lumbar region: Secondary | ICD-10-CM

## 2012-07-25 ENCOUNTER — Ambulatory Visit
Admission: RE | Admit: 2012-07-25 | Discharge: 2012-07-25 | Disposition: A | Discharge status: Home / Self Care | Payer: Medicare Other | Source: Ambulatory Visit | Attending: Specialist | Admitting: Specialist

## 2012-07-25 DIAGNOSIS — M5136 Other intervertebral disc degeneration, lumbar region: Secondary | ICD-10-CM

## 2012-07-25 MED ORDER — ONDANSETRON HCL 4 MG/2ML IJ SOLN: 4.0000 mg | Freq: Four times a day (QID) | INTRAMUSCULAR | Status: DC | PRN

## 2012-07-25 MED ORDER — IOHEXOL 180 MG/ML  SOLN
15.0000 mL | Freq: Once | INTRAMUSCULAR | Status: AC | PRN
  Administered 2012-07-25: 15 mL via INTRATHECAL

## 2012-07-25 NOTE — Progress Notes (Signed)
Pt states she has been off citalopram and tramadol for the past 2 days. Also, pt took own valium.

## 2012-08-01 ENCOUNTER — Encounter: Payer: Self-pay | Admitting: *Deleted

## 2012-08-01 ENCOUNTER — Ambulatory Visit (INDEPENDENT_AMBULATORY_CARE_PROVIDER_SITE_OTHER): Payer: Medicare Other | Admitting: Internal Medicine

## 2012-08-01 ENCOUNTER — Encounter: Payer: Self-pay | Admitting: Internal Medicine

## 2012-08-01 VITALS — BP 124/84 | HR 81 | Temp 98.1°F | Ht 61.75 in | Wt 184.0 lb

## 2012-08-01 DIAGNOSIS — M549 Dorsalgia, unspecified: Secondary | ICD-10-CM

## 2012-08-01 DIAGNOSIS — Z0000 Encounter for general adult medical examination without abnormal findings: Secondary | ICD-10-CM

## 2012-08-01 DIAGNOSIS — Z01818 Encounter for other preprocedural examination: Secondary | ICD-10-CM

## 2012-08-01 DIAGNOSIS — F341 Dysthymic disorder: Secondary | ICD-10-CM

## 2012-08-01 DIAGNOSIS — R739 Hyperglycemia, unspecified: Secondary | ICD-10-CM

## 2012-08-01 DIAGNOSIS — Z23 Encounter for immunization: Secondary | ICD-10-CM

## 2012-08-01 DIAGNOSIS — E559 Vitamin D deficiency, unspecified: Secondary | ICD-10-CM

## 2012-08-01 DIAGNOSIS — E785 Hyperlipidemia, unspecified: Secondary | ICD-10-CM

## 2012-08-01 DIAGNOSIS — I10 Essential (primary) hypertension: Secondary | ICD-10-CM

## 2012-08-01 DIAGNOSIS — R7309 Other abnormal glucose: Secondary | ICD-10-CM

## 2012-08-01 LAB — COMPREHENSIVE METABOLIC PANEL
Albumin: 4.1 g/dL (ref 3.5–5.2)
BUN: 12 mg/dL (ref 6–23)
CO2: 26 mEq/L (ref 19–32)
Calcium: 9.4 mg/dL (ref 8.4–10.5)
Chloride: 106 mEq/L (ref 96–112)
Glucose, Bld: 114 mg/dL — ABNORMAL HIGH (ref 70–99)
Potassium: 3.8 mEq/L (ref 3.5–5.1)

## 2012-08-01 LAB — CBC WITH DIFFERENTIAL/PLATELET
Eosinophils Relative: 1.1 % (ref 0.0–5.0)
HCT: 42.6 % (ref 36.0–46.0)
Lymphs Abs: 2 10*3/uL (ref 0.7–4.0)
MCV: 94.7 fl (ref 78.0–100.0)
Monocytes Absolute: 0.5 10*3/uL (ref 0.1–1.0)
Neutro Abs: 3.6 10*3/uL (ref 1.4–7.7)
Platelets: 187 10*3/uL (ref 150.0–400.0)
RDW: 14.4 % (ref 11.5–14.6)

## 2012-08-01 LAB — LIPID PANEL
Cholesterol: 159 mg/dL (ref 0–200)
HDL: 43.1 mg/dL (ref >39.00)
Total CHOL/HDL Ratio: 4
Triglycerides: 204 mg/dL — ABNORMAL HIGH (ref 0.0–149.0)
VLDL: 40.8 mg/dL — ABNORMAL HIGH (ref 0.0–40.0)

## 2012-08-01 NOTE — Assessment & Plan Note (Addendum)
In need of lumbar spine surgery. Review of systems does not suggest COPD, CHF, CAD. EKG today -- nsr, low voltage, at baseline Had a cardiac catheterization 4 years ago but show up to a 40% blockage. She has mild hyperglycemia, high cholesterol hypertension Plan: Stress test before surgery.

## 2012-08-01 NOTE — Assessment & Plan Note (Signed)
Well-controlled, no change 

## 2012-08-01 NOTE — Progress Notes (Signed)
Subjective:    Patient ID: [name], female    DOB: 12-07-1945, 67 y.o.   MRN: [id number]  HPI  1. Risk factors based on Past M, S, F history: reviewed 2. Physical Activities:  Not very active d/t back pain, just doing home chores   3. Depression/mood:   well controlled w/ meds   4. Hearing: no problems noted or reported   5. ADL's:  Totally independent   6. Fall Risk: no recent falls, see instructions 7. home Safety: does feelsafe at home   8. Height, weight, &visual acuity: see VS, vision corrected w/ glasses, has cataracts , sees eye MD regularly   9. Counseling: provided 10. Labs ordered based on risk factors: if needed   11. Referral Coordination: if needed 12.  Care Plan, see assessment and plan   13.   Cognitive Assessment: cognition and motor skills wnl  In addition, today we discussed the following: Severe back pain, needs surgical clearance. Hypertension, good medication compliance, reports ambulatory BPs in the 120s. She takes amlodipine, sometimes has edema around the ankles at the end of the day. High cholesterol, good medication compliance. Takes Xanax as directed, has taking one or 2 in the last 2 days.  Past Medical History  Diagnosis Date  . Anxiety and depression   . Headache   . Eczema   . Hypertension   . Goiter   . Dizziness     severe: MRI chronic isch. changes and mastoiditis- saw Dr.Mundy(2007)  . S/P cardiac cath 2009    Revealing nonobstructive CAD w/ tubular, discrete 40% mid lesion in the circumflex; discrete 30% proximal lesion in the LAD; luminal irregularities, 35% mid lesion in RCA; and generic 40% mid lesion in the RCA. Medical treatment recommended.  . Hyperlipidemia    Past Surgical History  Procedure Date  . Cholecystectomy   . Abdominal hysterectomy     oophorectomy L only  . Shoulder surgery 2006    left   . Hand surgery 1990s    R hand x 2, L hand x 1  . Bunionectomy 1990s    LEFT   History   Social History  .  Marital Status: Married    Spouse Name: N/A    Number of Children: 4  . Years of Education: N/A   Occupational History  . retired     Social History Main Topics  . Smoking status: Former Games developer  . Smokeless tobacco: Never Used     Comment: quit aprox 2000 after 30 years, 1 to 1.5 ppd  . Alcohol Use: No  . Drug Use: No  . Sexually Active: Not on file   Other Topics Concern  . Not on file   Social History Narrative   Lives w/ husband      Family History: colon ca--no breast ca-- 2 cousins pancreas ca-- uncle throat ca--cousin MI--several family members including his 50y/o son lung ca--mother, cousin   Review of Systems Denies chest pain or shortness or breath. No orthopnea. Able to do all her regular activities without getting short of breath including going up to the 2nd floor. Denies cough or wheezing. No history of COPD or asthma. No nausea, vomiting, diarrhea or blood in the stools. Admits to some snoring, denies feeling sleepy throughout the day.    Objective:   Physical Exam General -- alert, well-developed,   BMI 33    Neck --no thyromegaly , normal carotid pulse; no JVD at 45 Lungs -- normal respiratory effort, no  intercostal retractions, no accessory muscle use, and normal breath sounds.   Heart-- normal rate, regular rhythm, no murmur, and no gallop.   Abdomen--soft, non-tender, no distention, no masses, no HSM, no guarding, and no rigidity.   Extremities-- no pretibial edema bilaterally Neurologic-- alert & oriented X3 and strength normal in all extremities. Psych-- Cognition and judgment appear intact. Alert and cooperative with normal attention span and concentration.  not anxious appearing and not depressed appearing.      Assessment & Plan:

## 2012-08-01 NOTE — Assessment & Plan Note (Signed)
Td 2002 and 2012  pneumonia shot 2010 Shingles shot 2013  Sees gyn, plans to find a new one that takes her insurance   MMG  7-11 neg , plans to get one , will call for referal if needed  Last colonoscopy  07-2009, next 2016 DEXA 07-2009 neg  History of vitamin D deficiency, labs Diet and exercise discussed  Unable to exercise much, due to back pain. Diet discussed

## 2012-08-01 NOTE — Patient Instructions (Signed)

## 2012-08-01 NOTE — Assessment & Plan Note (Signed)
Check a A1c 

## 2012-08-01 NOTE — Assessment & Plan Note (Signed)
Seems well controlled  

## 2012-08-01 NOTE — Assessment & Plan Note (Signed)
Good medication compliance, check FLP and LFTs 

## 2012-08-02 ENCOUNTER — Encounter: Payer: Self-pay | Admitting: Internal Medicine

## 2012-08-02 LAB — VITAMIN D 25 HYDROXY (VIT D DEFICIENCY, FRACTURES): Vit D, 25-Hydroxy: 14 ng/mL — ABNORMAL LOW (ref 30–89)

## 2012-08-05 ENCOUNTER — Encounter: Payer: Self-pay | Admitting: Cardiology

## 2012-08-07 MED ORDER — ERGOCALCIFEROL 1.25 MG (50000 UT) PO CAPS: 50000.0000 [IU] | ORAL_CAPSULE | ORAL | Status: DC

## 2012-08-07 NOTE — Addendum Note (Signed)
Addended by: Edwena Felty T on: 08/07/2012 12:31 PM   Modules accepted: Orders

## 2012-08-12 ENCOUNTER — Ambulatory Visit (HOSPITAL_COMMUNITY): Payer: Medicare Other | Attending: Cardiology | Admitting: Radiology

## 2012-08-12 VITALS — Ht 61.0 in | Wt 182.0 lb

## 2012-08-12 DIAGNOSIS — I10 Essential (primary) hypertension: Secondary | ICD-10-CM | POA: Insufficient documentation

## 2012-08-12 DIAGNOSIS — I2510 Atherosclerotic heart disease of native coronary artery without angina pectoris: Secondary | ICD-10-CM

## 2012-08-12 DIAGNOSIS — R0602 Shortness of breath: Secondary | ICD-10-CM

## 2012-08-12 DIAGNOSIS — R079 Chest pain, unspecified: Secondary | ICD-10-CM | POA: Insufficient documentation

## 2012-08-12 DIAGNOSIS — R0609 Other forms of dyspnea: Secondary | ICD-10-CM | POA: Insufficient documentation

## 2012-08-12 DIAGNOSIS — R002 Palpitations: Secondary | ICD-10-CM | POA: Insufficient documentation

## 2012-08-12 DIAGNOSIS — M549 Dorsalgia, unspecified: Secondary | ICD-10-CM

## 2012-08-12 DIAGNOSIS — R0989 Other specified symptoms and signs involving the circulatory and respiratory systems: Secondary | ICD-10-CM | POA: Insufficient documentation

## 2012-08-12 MED ORDER — TECHNETIUM TC 99M SESTAMIBI GENERIC - CARDIOLITE
33.0000 | Freq: Once | INTRAVENOUS | Status: AC | PRN
  Administered 2012-08-12: 33 via INTRAVENOUS

## 2012-08-12 MED ORDER — REGADENOSON 0.4 MG/5ML IV SOLN
0.4000 mg | Freq: Once | INTRAVENOUS | Status: AC
  Administered 2012-08-12: 0.4 mg via INTRAVENOUS

## 2012-08-12 MED ORDER — TECHNETIUM TC 99M SESTAMIBI GENERIC - CARDIOLITE
11.0000 | Freq: Once | INTRAVENOUS | Status: AC | PRN
  Administered 2012-08-12: 11 via INTRAVENOUS

## 2012-08-12 NOTE — Progress Notes (Signed)
  [facility] SITE 3 NUCLEAR MED [address] [place], [place] [address] [phone]    Cardiology Nuclear Med Study  [name] is a 67 y.o. female     MRN : [id number]     DOB: 1945-11-21  Procedure Date: 08/12/2012  Nuclear Med Background Indication for Stress Test:  Evaluation for Ischemia and Surgical Clearance:Pending Back Surgery by Dr. Jene Every History:  '01 Myocardial Perfusion Study-Normal, EF=87%, '09 Heart Catheterization-Nonobstructive disease, treat medically Cardiac Risk Factors: Family History - CAD, History of Smoking, Hypertension, Lipids and TIA  Symptoms: Chest pain without exertion, second duration (last occurrence last night),  DOE, Fatigue, Fatigue with Exertion, Light-Headedness and Palpitations   Nuclear Pre-Procedure Caffeine/Decaff Intake:  8:00pm NPO After: 8:00pm   Lungs:  clear O2 Sat: 97% on room air. IV 0.9% NS with Angio Cath:  24g  IV Site: L Hand  IV Started by:  Doyne Keel, CNMT  Chest Size (in):  40 Cup Size: C  Height: 5\' 1"  (1.549 m)  Weight:  182 lb (82.555 kg)  BMI:  Body mass index is 34.39 kg/(m^2). Tech Comments:  n/a    Nuclear Med Study 1 or 2 day study: 1 day  Stress Test Type:  Treadmill/Lexiscan  Reading MD: Olga Millers, MD  Order Authorizing Provider:  Elita Quick Paz,MD  Resting Radionuclide: Technetium 41m Sestamibi  Resting Radionuclide Dose: 11.0 mCi   Stress Radionuclide:  Technetium 40m Sestamibi  Stress Radionuclide Dose: 33.0 mCi           Stress Protocol Rest HR: 75 Stress HR: 120  Rest BP: 114/74 Stress BP: 141/67  Exercise Time (min): 2:00 METS: n/a   Predicted Max HR: 152 bpm % Max HR: 78.95 bpm Rate Pressure Product: 27253    Dose of Adenosine (mg):  n/a Dose of Lexiscan: 0.4 mg  Dose of Atropine (mg): n/a Dose of Dobutamine: n/a mcg/kg/min (at max HR)  Stress Test Technologist: Irean Hong, RN  Nuclear Technologist:  Domenic Polite, CNMT     Rest Procedure:  Myocardial  perfusion imaging was performed at rest 45 minutes following the intravenous administration of Technetium 33m Sestamibi. Rest ECG: NSR with nonspecific ST changes.  Stress Procedure:  The patient received IV Lexiscan 0.4 mg over 15-seconds with concurrent low level exercise and then Technetium 21m Sestamibi was injected at 30-seconds while the patient continued walking one more minute. The patient complained of DOE, but denied chest  pain. Quantitative spect images were obtained after a 45-minute delay. Stress ECG: No significant ST segment change suggestive of ischemia.  QPS Raw Data Images:  Acquisition technically good; normal left ventricular size. Stress Images:  Normal homogeneous uptake in all areas of the myocardium. Rest Images:  Normal homogeneous uptake in all areas of the myocardium. Subtraction (SDS):  No evidence of ischemia. Transient Ischemic Dilatation (Normal <1.22):  0.87 Lung/Heart Ratio (Normal <0.45):  0.39  Quantitative Gated Spect Images QGS EDV:  45 ml QGS ESV:  8 ml  Impression Exercise Capacity:  Lexiscan with low level exercise. BP Response:  Normal blood pressure response. Clinical Symptoms:  There is dyspnea. ECG Impression:  No significant ST segment change suggestive of ischemia. Comparison with Prior Nuclear Study: No images to compare  Overall Impression:  Normal stress nuclear study.  LV Ejection Fraction: 82%.  LV Wall Motion:  NL LV Function; NL Wall Motion  Olga Millers

## 2012-08-13 ENCOUNTER — Other Ambulatory Visit: Payer: Self-pay | Admitting: Internal Medicine

## 2012-08-13 NOTE — Telephone Encounter (Signed)
Refill done.  

## 2012-08-15 ENCOUNTER — Other Ambulatory Visit: Payer: Self-pay | Admitting: Internal Medicine

## 2012-08-15 ENCOUNTER — Telehealth: Payer: Self-pay | Admitting: Internal Medicine

## 2012-08-15 NOTE — Telephone Encounter (Signed)
Advise patient, stress test was negative. Let her surgeon know, she is cleared for surgery

## 2012-08-15 NOTE — Telephone Encounter (Signed)
Refill done.  

## 2012-08-15 NOTE — Telephone Encounter (Signed)
Discussed with pt, & Dr. Jillyn Hidden made aware.

## 2012-08-24 ENCOUNTER — Other Ambulatory Visit: Payer: Self-pay | Admitting: Internal Medicine

## 2012-08-25 NOTE — Telephone Encounter (Signed)
Refill done.  

## 2012-09-29 ENCOUNTER — Ambulatory Visit (INDEPENDENT_AMBULATORY_CARE_PROVIDER_SITE_OTHER): Payer: Medicare Other | Admitting: Internal Medicine

## 2012-09-29 ENCOUNTER — Encounter: Payer: Self-pay | Admitting: Internal Medicine

## 2012-09-29 VITALS — BP 130/82 | HR 88 | Temp 97.8°F | Wt 180.0 lb

## 2012-09-29 DIAGNOSIS — R21 Rash and other nonspecific skin eruption: Secondary | ICD-10-CM

## 2012-09-29 DIAGNOSIS — F341 Dysthymic disorder: Secondary | ICD-10-CM

## 2012-09-29 MED ORDER — VALACYCLOVIR HCL 1 G PO TABS: 1000.0000 mg | ORAL_TABLET | Freq: Three times a day (TID) | ORAL | Status: DC

## 2012-09-29 MED ORDER — ALPRAZOLAM 0.25 MG PO TABS: ORAL_TABLET | ORAL | Status: DC

## 2012-09-29 NOTE — Patient Instructions (Addendum)
Take valtrex as prescribed Call if the rash gets worse, ear pain increase or if you have eye irritation  Shingles Shingles is caused by the same virus that causes chickenpox (varicella zoster virus or VZV). Shingles often occurs many years or decades after having chickenpox. That is why it is more common in adults older than 50 years. The virus reactivates and breaks out as an infection in a nerve root. SYMPTOMS   The initial feeling (sensations) may be pain. This pain is usually described as:  Burning.  Stabbing.  Throbbing.  Tingling in the nerve root.  A red rash will follow in a couple days. The rash may occur in any area of the body and is usually on one side (unilateral) of the body in a band or belt-like pattern. The rash usually starts out as very small blisters (vesicles). They will dry up after 7 to 10 days. This is not usually a significant problem except for the pain it causes.  Long-lasting (chronic) pain is more likely in an elderly person. It can last months to years. This condition is called postherpetic neuralgia. Shingles can be an extremely severe infection in someone with AIDS, a weakened immune system, or with forms of leukemia. It can also be severe if you are taking transplant medicines or other medicines that weaken the immune system. TREATMENT  Your caregiver will often treat you with:  Antiviral drugs.  Anti-inflammatory drugs.  Pain medicines. Bed rest is very important in preventing the pain associated with herpes zoster (postherpetic neuralgia). Application of heat in the form of a hot water bottle or electric heating pad or gentle pressure with the hand is recommended to help with the pain or discomfort. PREVENTION  A varicella zoster vaccine is available to help protect against the virus. The Food and Drug Administration approved the varicella zoster vaccine for individuals 77 years of age and older. HOME CARE INSTRUCTIONS   Cool compresses to the area  of rash may be helpful.  Only take over-the-counter or prescription medicines for pain, discomfort, or fever as directed by your caregiver.  Avoid contact with:  Babies.  Pregnant women.  Children with eczema.  Elderly people with transplants.  People with chronic illnesses, such as leukemia and AIDS.  If the area involved is on your face, you may receive a referral for follow-up to a specialist. It is very important to keep all follow-up appointments. This will help avoid eye complications, chronic pain, or disability. SEEK IMMEDIATE MEDICAL CARE IF:   You develop any pain (headache) in the area of the face or eye. This must be followed carefully by your caregiver or ophthalmologist. An infection in part of your eye (cornea) can be very serious. It could lead to blindness.  You do not have pain relief from prescribed medicines.  Your redness or swelling spreads.  The area involved becomes very swollen and painful.  You have a fever.  You notice any red or painful lines extending away from the affected area toward your heart (lymphangitis).  Your condition is worsening or has changed. Document Released: 07/09/2005 Document Revised: 10/01/2011 Document Reviewed: 06/13/2009 [facility] Patient Information 2013 [place], [place].

## 2012-09-29 NOTE — Progress Notes (Signed)
  Subjective:    Patient ID: [name], female    DOB: 07-01-1946, 67 y.o.   MRN: [id number]  HPI Acute visit Developed a rash in the back of the head 2 and half days ago, it itches >> than hurts. Has noted a couple of raised areas. She also reports a ill-defined ache in the left ear, left jaw, sometimes worse when she chew. She thinks is probably shingles Also, needs a refill on alprazolam  Past Medical History  Diagnosis Date  . Anxiety and depression   . Headache   . Eczema   . Hypertension   . Goiter   . Dizziness     severe: MRI chronic isch. changes and mastoiditis- saw Dr.Mundy(2007)  . S/P cardiac cath 2009    Revealing nonobstructive CAD w/ tubular, discrete 40% mid lesion in the circumflex; discrete 30% proximal lesion in the LAD; luminal irregularities, 35% mid lesion in RCA; and generic 40% mid lesion in the RCA. Medical treatment recommended.  . Hyperlipidemia    Past Surgical History  Procedure Laterality Date  . Cholecystectomy    . Abdominal hysterectomy      oophorectomy L only  . Shoulder surgery  2006    left   . Hand surgery  1990s    R hand x 2, L hand x 1  . Bunionectomy  1990s    LEFT     Review of Systems No fever or chills Had some nausea but no vomiting. Had diarrhea yesterday but symptoms resolved today. No eye complaints.     Objective:   Physical Exam  Neck:      General -- alert, well-developed  Neck - no LADs. HEENT --   throat w/o redness, face symmetric and not tender to palpation, no facial rash. Left ear normal. Left TMJ slightly tender? No click.  Neurologic-- alert & oriented X3 and strength normal in all extremities. Psych-- Cognition and judgment appear intact. Alert and cooperative with normal attention span and concentration.  not anxious appearing and not depressed appearing.          Assessment & Plan:  Rash, Rash is not classic for shingles, she does have 2 "bumps"  in the area and the ill-defined pain in  the left jaw and ear. She probably has a mild eczema, however atypical shingles is in the differential. Will start Valtrex. See instructions

## 2012-09-29 NOTE — Assessment & Plan Note (Signed)
RF xanax.

## 2012-10-29 ENCOUNTER — Other Ambulatory Visit: Payer: Self-pay | Admitting: Orthopedic Surgery

## 2012-10-29 ENCOUNTER — Encounter (HOSPITAL_COMMUNITY): Payer: Self-pay | Admitting: Pharmacy Technician

## 2012-10-29 NOTE — Progress Notes (Signed)
Need orders for pre op 10-21-2012 at 100 pm  Asap. thanks

## 2012-10-30 ENCOUNTER — Encounter (HOSPITAL_COMMUNITY): Payer: Self-pay

## 2012-10-30 ENCOUNTER — Encounter (HOSPITAL_COMMUNITY)
Admission: RE | Admit: 2012-10-30 | Discharge: 2012-10-30 | Disposition: A | Discharge status: Home / Self Care | Payer: Medicare Other | Source: Ambulatory Visit | Attending: Specialist | Admitting: Specialist

## 2012-10-30 ENCOUNTER — Ambulatory Visit (HOSPITAL_COMMUNITY)
Admission: RE | Admit: 2012-10-30 | Discharge: 2012-10-30 | Disposition: A | Discharge status: Home / Self Care | Payer: Medicare Other | Source: Ambulatory Visit | Attending: Specialist | Admitting: Specialist

## 2012-10-30 ENCOUNTER — Ambulatory Visit (HOSPITAL_COMMUNITY)
Admission: RE | Admit: 2012-10-30 | Discharge: 2012-10-30 | Disposition: A | Discharge status: Home / Self Care | Payer: Medicare Other | Source: Ambulatory Visit | Attending: Orthopedic Surgery | Admitting: Orthopedic Surgery

## 2012-10-30 DIAGNOSIS — Z01812 Encounter for preprocedural laboratory examination: Secondary | ICD-10-CM | POA: Insufficient documentation

## 2012-10-30 DIAGNOSIS — M47817 Spondylosis without myelopathy or radiculopathy, lumbosacral region: Secondary | ICD-10-CM | POA: Insufficient documentation

## 2012-10-30 DIAGNOSIS — M51379 Other intervertebral disc degeneration, lumbosacral region without mention of lumbar back pain or lower extremity pain: Secondary | ICD-10-CM | POA: Insufficient documentation

## 2012-10-30 DIAGNOSIS — Z01818 Encounter for other preprocedural examination: Secondary | ICD-10-CM | POA: Insufficient documentation

## 2012-10-30 DIAGNOSIS — I700 Atherosclerosis of aorta: Secondary | ICD-10-CM | POA: Insufficient documentation

## 2012-10-30 DIAGNOSIS — Z9089 Acquired absence of other organs: Secondary | ICD-10-CM | POA: Insufficient documentation

## 2012-10-30 DIAGNOSIS — M48061 Spinal stenosis, lumbar region without neurogenic claudication: Secondary | ICD-10-CM | POA: Insufficient documentation

## 2012-10-30 DIAGNOSIS — I10 Essential (primary) hypertension: Secondary | ICD-10-CM | POA: Insufficient documentation

## 2012-10-30 DIAGNOSIS — M5137 Other intervertebral disc degeneration, lumbosacral region: Secondary | ICD-10-CM | POA: Insufficient documentation

## 2012-10-30 HISTORY — DX: Atherosclerotic heart disease of native coronary artery without angina pectoris: I25.10

## 2012-10-30 HISTORY — DX: Gastro-esophageal reflux disease without esophagitis: K21.9

## 2012-10-30 HISTORY — DX: Unspecified osteoarthritis, unspecified site: M19.90

## 2012-10-30 HISTORY — DX: Pain, unspecified: R52

## 2012-10-30 LAB — BASIC METABOLIC PANEL
CO2: 26 mEq/L (ref 19–32)
Glucose, Bld: 131 mg/dL — ABNORMAL HIGH (ref 70–99)
Potassium: 3.8 mEq/L (ref 3.5–5.1)
Sodium: 140 mEq/L (ref 135–145)

## 2012-10-30 LAB — CBC
HCT: 42.6 % (ref 36.0–46.0)
Hemoglobin: 14.7 g/dL (ref 12.0–15.0)
MCH: 32.5 pg (ref 26.0–34.0)
MCV: 94 fL (ref 78.0–100.0)
RBC: 4.53 MIL/uL (ref 3.87–5.11)

## 2012-10-30 NOTE — Pre-Procedure Instructions (Signed)
EKG REPORT IN EPIC FROM 08/01/12. NUCLEAR STRESS TEST REPORT IN EPIC FROM 08/12/12 - NORMAL CXR  AND BACK XRAYS WERE DONE TODAY - PREOP - AT [facility]

## 2012-10-30 NOTE — Patient Instructions (Addendum)
YOUR SURGERY IS SCHEDULED AT [facility]  ON:  Thursday  4/24  REPORT TO [place] SHORT STAY CENTER AT:  11:30 AM      PHONE # FOR SHORT STAY IS [phone]  DO NOT EAT ANYTHING AFTER MIDNIGHT THE NIGHT BEFORE YOUR SURGERY.  NO FOOD, NO CHEWING GUM, NO MINTS, NO CANDIES, NO CHEWING TOBACCO. YOU MAY HAVE CLEAR LIQUIDS TO DRINK UNTIL 7:30 AM DAY OF SURGERY - LIKE WATER, DIET COKE.  NOTHING TO DRINK AFTER 7:30 AM DAY OF YOUR SURGERY.  PLEASE TAKE THE FOLLOWING MEDICATIONS THE AM OF YOUR SURGERY WITH A FEW SIPS OF WATER:  OMEPRAZOLE, AMLODIPINE, CITALOPRAM, TRAMADOL IF NEEDED FOR PAIN, ALPRAZOLAM IF NEEDED FOR ANXIETY.    DO NOT BRING VALUABLES, MONEY, CREDIT CARDS.  DO NOT WEAR JEWELRY, MAKE-UP, NAIL POLISH AND NO METAL PINS OR CLIPS IN YOUR HAIR. CONTACT LENS, DENTURES / PARTIALS, GLASSES SHOULD NOT BE WORN TO SURGERY AND IN MOST CASES-HEARING AIDS WILL NEED TO BE REMOVED.  BRING YOUR GLASSES CASE, ANY EQUIPMENT NEEDED FOR YOUR CONTACT LENS. FOR PATIENTS ADMITTED TO THE HOSPITAL--CHECK OUT TIME THE DAY OF DISCHARGE IS 11:00 AM.  ALL INPATIENT ROOMS ARE PRIVATE - WITH BATHROOM, TELEPHONE, TELEVISION AND WIFI INTERNET.                                PLEASE READ OVER ANY  FACT SHEETS THAT YOU WERE GIVEN: MRSA INFORMATION, BLOOD TRANSFUSION INFORMATION, INCENTIVE SPIROMETER INFORMATION. FAILURE TO FOLLOW THESE INSTRUCTIONS MAY RESULT IN THE CANCELLATION OF YOUR SURGERY.   PATIENT SIGNATURE_________________________________

## 2012-11-12 ENCOUNTER — Other Ambulatory Visit: Payer: Self-pay | Admitting: Orthopedic Surgery

## 2012-11-12 NOTE — H&P (Signed)
[name] is an 67 y.o. female.   Chief Complaint: back and right leg pain HPI: Hx ongoing LBP intermittent, episodic x 5-6 years. C/o increased pain approx 6 months back and right leg pain, numbness and leg pain (right). The patient states that they are doing 75 percent better (left sided symptoms improved). Current treatment includes: NSAIDs and pain medications (Ultram and Celebrex). Prior ESI's with temporary relief. Pain refractory to most recent ESI left L3-4 (Nov. 2013), Pt states the ESI helped with her L sided pain but she continues to have R sided LBP which is radiating into the R lateral thigh. She still notes numbness and tingling in the R thigh. No bowel or bladder incontinence. R leg continues to feel weak. She has to lay in the bed for relief. Past Medical History  Diagnosis Date  . Anxiety and depression   . Eczema   . Hypertension   . Goiter   . Dizziness     severe: MRI chronic isch. changes and mastoiditis- saw Dr.Mundy(2007)  STATES SHE STILL HAS EPISODES- ESPECIALLY IF SHE GETS UP TOO QUICKLY AFTER LYING DOWN  . S/P cardiac cath 2009    Revealing nonobstructive CAD w/ tubular, discrete 40% mid lesion in the circumflex; discrete 30% proximal lesion in the LAD; luminal irregularities, 35% mid lesion in RCA; and generic 40% mid lesion in the RCA. Medical treatment recommended.  . Hyperlipidemia   . GERD (gastroesophageal reflux disease)   . Headache   . Arthritis   . Pain     PAIN LOWER BACK AND DOWN RIGHT LEG WITH NUMBNESS, TINGLING RT LEG AND FOOT--STENOSIS  . Coronary artery disease     Past Surgical History  Procedure Laterality Date  . Cholecystectomy    . Abdominal hysterectomy      oophorectomy L only  . Shoulder surgery  2006    left   . Hand surgery  1990s    R hand x 2, L hand x 1  . Bunionectomy  1990s    LEFT    Family History  Problem Relation Age of Onset  . Colon cancer Neg Hx   . Breast cancer      2 cousins  . Throat cancer       cousin  . Heart attack      several family memers  . Lung cancer Mother     cousin   Social History:  reports that she has quit smoking. She has never used smokeless tobacco. She reports that she does not drink alcohol or use illicit drugs.  Allergies:  Allergies  Allergen Reactions  . Sertraline Hcl Other (See Comments)     muscle aches, diarrhea, nausea  . Codeine Other (See Comments)      chest and stomach pain, severe abd cramping     (Not in a hospital admission)  No results found for this or any previous visit (from the past 48 hour(s)). No results found.  Review of Systems  Constitutional: Negative.   HENT: Negative.   Eyes: Negative.   Respiratory: Negative.   Cardiovascular: Negative.   Gastrointestinal: Negative.   Genitourinary: Negative.   Musculoskeletal: Positive for back pain.  Skin: Negative.   Neurological: Positive for sensory change and focal weakness.  Psychiatric/Behavioral: Negative.     There were no vitals taken for this visit. Physical Exam  Constitutional: She is oriented to person, place, and time. She appears well-developed and well-nourished.  HENT:  Head: Normocephalic and atraumatic.  Eyes: Conjunctivae  and EOM are normal. Pupils are equal, round, and reactive to light.  Neck: Normal range of motion. Neck supple.  Cardiovascular: Normal rate and regular rhythm.   Respiratory: Effort normal and breath sounds normal.  GI: Soft. Bowel sounds are normal.  Musculoskeletal:  She is upright in a seated position, minimal distress. Her straight leg raise produces buttock pain bilaterally and trace EHL weakness, pain with extension, relieved with forward flexion.  Lumbar spine exam reveals no evidence of soft tissue swelling, deformity or skin ecchymosis. On palpation there is no tenderness of the lumbar spine. No flank pain with percussion. The abdomen is soft and nontender. Nontender over the trochanters. No cellulitis or  lymphadenopathy.  Motor is 5/5 including tibialis anterior, plantar flexion, quadriceps and hamstrings. Patient is normoreflexic. There is no Babinski or clonus. Sensory exam is intact to light touch. Patient has good distal pulses. No DVT. No pain and normal range of motion without instability of the hips, knees and ankles.   Neurological: She is alert and oriented to person, place, and time. She has normal reflexes.  Skin: Skin is warm and dry.  Psychiatric: She has a normal mood and affect.    MRI and myelogram that demonstrates lateral recessed stenosis at 2-3 on the right with lateral bending and 4-5 bilaterally.  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 Currently with onset of left lower extremity symptoms. She has had no instability with flexion extension, years of duration. She has had epidurals, physical therapy and activity modification. She has asked for other options. We discussed lumbar decompression at 2-3 and 4-5. I do feel this delineates at least the pathology associated with neurocompression. She does have spondylosis; however, and I have indicated residual back pain.  I had an extensive discussion of the risks and benefits of the lumbar decompression with the patient including bleeding, infection, damage to neurovascular structures, epidural fibrosis, CSF leak requiring repair. We also discussed increase in pain, adjacent segment disease, recurrent disc herniation, need for future surgery including repeat decompression and/or fusion. We also discussed risks of postoperative hematoma, paralysis, anesthetic complications including DVT, PE, death, cardiopulmonary dysfunction. In addition, the perioperative and postoperative courses were discussed in detail including the rehabilitative time and return to functional activity and work. I provided the patient with an  illustrated handout and utilized the appropriate surgical models.    Andrez Grime M. for Dr. Shelle Iron 11/12/2012, 9:01 PM

## 2012-11-13 ENCOUNTER — Ambulatory Visit (HOSPITAL_COMMUNITY): Payer: Medicare Other | Admitting: Anesthesiology

## 2012-11-13 ENCOUNTER — Encounter (HOSPITAL_COMMUNITY)
Admission: RE | Disposition: A | Discharge status: Home / Self Care | Payer: Self-pay | Source: Ambulatory Visit | Attending: Specialist

## 2012-11-13 ENCOUNTER — Ambulatory Visit (HOSPITAL_COMMUNITY): Payer: Medicare Other

## 2012-11-13 ENCOUNTER — Encounter (HOSPITAL_COMMUNITY): Payer: Self-pay | Admitting: Anesthesiology

## 2012-11-13 ENCOUNTER — Encounter (HOSPITAL_COMMUNITY): Payer: Self-pay | Admitting: *Deleted

## 2012-11-13 ENCOUNTER — Observation Stay (HOSPITAL_COMMUNITY)
Admission: RE | Admit: 2012-11-13 | Discharge: 2012-11-17 | Disposition: A | Discharge status: Home / Self Care | Payer: Medicare Other | Source: Ambulatory Visit | Attending: Specialist | Admitting: Specialist

## 2012-11-13 DIAGNOSIS — I2510 Atherosclerotic heart disease of native coronary artery without angina pectoris: Secondary | ICD-10-CM | POA: Insufficient documentation

## 2012-11-13 DIAGNOSIS — M48062 Spinal stenosis, lumbar region with neurogenic claudication: Principal | ICD-10-CM | POA: Insufficient documentation

## 2012-11-13 DIAGNOSIS — M48061 Spinal stenosis, lumbar region without neurogenic claudication: Secondary | ICD-10-CM

## 2012-11-13 DIAGNOSIS — E785 Hyperlipidemia, unspecified: Secondary | ICD-10-CM | POA: Insufficient documentation

## 2012-11-13 DIAGNOSIS — I10 Essential (primary) hypertension: Secondary | ICD-10-CM | POA: Insufficient documentation

## 2012-11-13 DIAGNOSIS — K219 Gastro-esophageal reflux disease without esophagitis: Secondary | ICD-10-CM | POA: Insufficient documentation

## 2012-11-13 HISTORY — PX: LUMBAR LAMINECTOMY/DECOMPRESSION MICRODISCECTOMY: SHX5026

## 2012-11-13 SURGERY — LUMBAR LAMINECTOMY/DECOMPRESSION MICRODISCECTOMY 2 LEVELS
Anesthesia: General | Wound class: Clean

## 2012-11-13 MED ORDER — MENTHOL 3 MG MT LOZG: 1.0000 | LOZENGE | OROMUCOSAL | Status: DC | PRN

## 2012-11-13 MED ORDER — SODIUM CHLORIDE 0.9 % IJ SOLN
3.0000 mL | Freq: Two times a day (BID) | INTRAMUSCULAR | Status: DC
  Administered 2012-11-14: 3 mL via INTRAVENOUS

## 2012-11-13 MED ORDER — GLYCOPYRROLATE 0.2 MG/ML IJ SOLN
INTRAMUSCULAR | Status: DC | PRN
  Administered 2012-11-13: .6 mg via INTRAVENOUS

## 2012-11-13 MED ORDER — POLYETHYLENE GLYCOL 3350 17 G PO PACK
17.0000 g | PACK | Freq: Every day | ORAL | Status: DC | PRN
  Administered 2012-11-15: 17 g via ORAL

## 2012-11-13 MED ORDER — BUPIVACAINE-EPINEPHRINE 0.5% -1:200000 IJ SOLN
INTRAMUSCULAR | Status: DC | PRN
  Administered 2012-11-13: 17 mL

## 2012-11-13 MED ORDER — HEMOSTATIC AGENTS (NO CHARGE) OPTIME
TOPICAL | Status: DC | PRN
  Administered 2012-11-13: 1 via TOPICAL

## 2012-11-13 MED ORDER — OXYCODONE-ACETAMINOPHEN 5-325 MG PO TABS: 1.0000 | ORAL_TABLET | ORAL | Status: DC | PRN

## 2012-11-13 MED ORDER — ONDANSETRON HCL 4 MG/2ML IJ SOLN
4.0000 mg | INTRAMUSCULAR | Status: DC | PRN
  Administered 2012-11-15 (×2): 4 mg via INTRAVENOUS
  Filled 2012-11-13 (×2): qty 2

## 2012-11-13 MED ORDER — SODIUM CHLORIDE 0.9 % IJ SOLN
3.0000 mL | INTRAMUSCULAR | Status: DC | PRN
  Administered 2012-11-14: 3 mL via INTRAVENOUS

## 2012-11-13 MED ORDER — SUCCINYLCHOLINE CHLORIDE 20 MG/ML IJ SOLN
INTRAMUSCULAR | Status: DC | PRN
  Administered 2012-11-13: 120 mg via INTRAVENOUS

## 2012-11-13 MED ORDER — ALPRAZOLAM 0.25 MG PO TABS: 0.2500 mg | ORAL_TABLET | Freq: Three times a day (TID) | ORAL | Status: DC | PRN

## 2012-11-13 MED ORDER — SODIUM CHLORIDE 0.9 % IV SOLN: 250.0000 mL | INTRAVENOUS | Status: DC

## 2012-11-13 MED ORDER — METHOCARBAMOL 500 MG PO TABS
500.0000 mg | ORAL_TABLET | Freq: Four times a day (QID) | ORAL | Status: DC | PRN
  Administered 2012-11-13 – 2012-11-17 (×6): 500 mg via ORAL
  Filled 2012-11-13 (×7): qty 1

## 2012-11-13 MED ORDER — FENTANYL CITRATE 0.05 MG/ML IJ SOLN
INTRAMUSCULAR | Status: DC | PRN
  Administered 2012-11-13: 50 ug via INTRAVENOUS
  Administered 2012-11-13: 100 ug via INTRAVENOUS
  Administered 2012-11-13: 50 ug via INTRAVENOUS

## 2012-11-13 MED ORDER — ACETAMINOPHEN 325 MG PO TABS
650.0000 mg | ORAL_TABLET | ORAL | Status: DC | PRN
  Administered 2012-11-15: 650 mg via ORAL
  Filled 2012-11-13: qty 2

## 2012-11-13 MED ORDER — ONDANSETRON HCL 4 MG/2ML IJ SOLN
INTRAMUSCULAR | Status: DC | PRN
  Administered 2012-11-13: 4 mg via INTRAVENOUS

## 2012-11-13 MED ORDER — MIDAZOLAM HCL 5 MG/5ML IJ SOLN
INTRAMUSCULAR | Status: DC | PRN
  Administered 2012-11-13: 1 mg via INTRAVENOUS

## 2012-11-13 MED ORDER — METHOCARBAMOL 500 MG PO TABS: 500.0000 mg | ORAL_TABLET | Freq: Four times a day (QID) | ORAL | Status: DC

## 2012-11-13 MED ORDER — ZOLPIDEM TARTRATE 5 MG PO TABS: 5.0000 mg | ORAL_TABLET | Freq: Every evening | ORAL | Status: DC | PRN

## 2012-11-13 MED ORDER — SODIUM CHLORIDE 0.45 % IV SOLN
INTRAVENOUS | Status: DC
  Administered 2012-11-13 – 2012-11-15 (×4): via INTRAVENOUS

## 2012-11-13 MED ORDER — HYDROCODONE-ACETAMINOPHEN 5-325 MG PO TABS
1.0000 | ORAL_TABLET | ORAL | Status: DC | PRN
  Administered 2012-11-13: 1 via ORAL
  Administered 2012-11-14 – 2012-11-17 (×9): 2 via ORAL
  Filled 2012-11-13 (×9): qty 2
  Filled 2012-11-13: qty 1

## 2012-11-13 MED ORDER — PHENOL 1.4 % MT LIQD: 1.0000 | OROMUCOSAL | Status: DC | PRN

## 2012-11-13 MED ORDER — MEPERIDINE HCL 50 MG/ML IJ SOLN
6.2500 mg | INTRAMUSCULAR | Status: DC | PRN
  Administered 2012-11-13 (×2): 12.5 mg via INTRAVENOUS

## 2012-11-13 MED ORDER — HYDROMORPHONE HCL PF 1 MG/ML IJ SOLN
0.2500 mg | INTRAMUSCULAR | Status: DC | PRN
  Administered 2012-11-13 (×2): 0.5 mg via INTRAVENOUS

## 2012-11-13 MED ORDER — LACTATED RINGERS IV SOLN
INTRAVENOUS | Status: DC
  Administered 2012-11-13: 13:00:00 via INTRAVENOUS

## 2012-11-13 MED ORDER — BUPIVACAINE-EPINEPHRINE (PF) 0.5% -1:200000 IJ SOLN
INTRAMUSCULAR | Status: AC
  Filled 2012-11-13: qty 10

## 2012-11-13 MED ORDER — CEFAZOLIN SODIUM-DEXTROSE 2-3 GM-% IV SOLR
INTRAVENOUS | Status: AC
  Filled 2012-11-13: qty 50

## 2012-11-13 MED ORDER — FLEET ENEMA 7-19 GM/118ML RE ENEM: 1.0000 | ENEMA | Freq: Once | RECTAL | Status: AC | PRN

## 2012-11-13 MED ORDER — MEPERIDINE HCL 50 MG/ML IJ SOLN
INTRAMUSCULAR | Status: AC
  Filled 2012-11-13: qty 1

## 2012-11-13 MED ORDER — LIDOCAINE HCL (CARDIAC) 20 MG/ML IV SOLN
INTRAVENOUS | Status: DC | PRN
  Administered 2012-11-13: 60 mg via INTRAVENOUS

## 2012-11-13 MED ORDER — HYDROCODONE-ACETAMINOPHEN 5-325 MG PO TABS: 1.0000 | ORAL_TABLET | ORAL | Status: DC | PRN

## 2012-11-13 MED ORDER — ACETAMINOPHEN 650 MG RE SUPP: 650.0000 mg | RECTAL | Status: DC | PRN

## 2012-11-13 MED ORDER — DIAZEPAM 5 MG/ML IJ SOLN
2.5000 mg | INTRAMUSCULAR | Status: AC
  Administered 2012-11-13 (×2): 2.5 mg via INTRAVENOUS

## 2012-11-13 MED ORDER — METHOCARBAMOL 100 MG/ML IJ SOLN: 500.0000 mg | Freq: Four times a day (QID) | INTRAVENOUS | Status: DC | PRN

## 2012-11-13 MED ORDER — ACETAMINOPHEN 10 MG/ML IV SOLN
INTRAVENOUS | Status: DC | PRN
  Administered 2012-11-13: 1000 mg via INTRAVENOUS

## 2012-11-13 MED ORDER — DOCUSATE SODIUM 100 MG PO CAPS
100.0000 mg | ORAL_CAPSULE | Freq: Two times a day (BID) | ORAL | Status: DC
  Administered 2012-11-13 – 2012-11-17 (×8): 100 mg via ORAL

## 2012-11-13 MED ORDER — THROMBIN 5000 UNITS EX SOLR
CUTANEOUS | Status: DC | PRN
  Administered 2012-11-13: 10000 [IU] via TOPICAL

## 2012-11-13 MED ORDER — AMLODIPINE BESYLATE 10 MG PO TABS
10.0000 mg | ORAL_TABLET | Freq: Every morning | ORAL | Status: DC
Start: 2012-11-14 — End: 2012-11-17
  Administered 2012-11-14 – 2012-11-17 (×4): 10 mg via ORAL
  Filled 2012-11-13 (×4): qty 1

## 2012-11-13 MED ORDER — CITALOPRAM HYDROBROMIDE 20 MG PO TABS
20.0000 mg | ORAL_TABLET | Freq: Every morning | ORAL | Status: DC
  Administered 2012-11-14 – 2012-11-17 (×4): 20 mg via ORAL
  Filled 2012-11-13 (×4): qty 1

## 2012-11-13 MED ORDER — ALUM & MAG HYDROXIDE-SIMETH 200-200-20 MG/5ML PO SUSP: 30.0000 mL | Freq: Four times a day (QID) | ORAL | Status: DC | PRN

## 2012-11-13 MED ORDER — PROPOFOL 10 MG/ML IV EMUL
INTRAVENOUS | Status: DC | PRN
  Administered 2012-11-13: 200 mg via INTRAVENOUS

## 2012-11-13 MED ORDER — HYDROMORPHONE HCL PF 1 MG/ML IJ SOLN
0.5000 mg | INTRAMUSCULAR | Status: DC | PRN
  Administered 2012-11-13: 0.5 mg via INTRAVENOUS
  Administered 2012-11-14: 1 mg via INTRAVENOUS
  Filled 2012-11-13 (×2): qty 1

## 2012-11-13 MED ORDER — ACETAMINOPHEN 10 MG/ML IV SOLN
INTRAVENOUS | Status: AC
  Filled 2012-11-13: qty 100

## 2012-11-13 MED ORDER — SODIUM CHLORIDE 0.9 % IR SOLN
Status: DC | PRN
  Administered 2012-11-13: 15:00:00

## 2012-11-13 MED ORDER — CEFAZOLIN SODIUM-DEXTROSE 2-3 GM-% IV SOLR
2.0000 g | Freq: Three times a day (TID) | INTRAVENOUS | Status: AC
  Administered 2012-11-13 – 2012-11-14 (×2): 2 g via INTRAVENOUS
  Filled 2012-11-13 (×2): qty 50

## 2012-11-13 MED ORDER — CHLORHEXIDINE GLUCONATE 4 % EX LIQD
60.0000 mL | Freq: Once | CUTANEOUS | Status: DC
  Filled 2012-11-13: qty 60

## 2012-11-13 MED ORDER — EPHEDRINE SULFATE 50 MG/ML IJ SOLN
INTRAMUSCULAR | Status: DC | PRN
  Administered 2012-11-13: 10 mg via INTRAVENOUS

## 2012-11-13 MED ORDER — LACTATED RINGERS IV SOLN
INTRAVENOUS | Status: DC
  Administered 2012-11-13: 17:00:00 via INTRAVENOUS

## 2012-11-13 MED ORDER — BISACODYL 5 MG PO TBEC: 5.0000 mg | DELAYED_RELEASE_TABLET | Freq: Every day | ORAL | Status: DC | PRN

## 2012-11-13 MED ORDER — CEFAZOLIN SODIUM-DEXTROSE 2-3 GM-% IV SOLR
2.0000 g | INTRAVENOUS | Status: AC
  Administered 2012-11-13: 2 g via INTRAVENOUS

## 2012-11-13 MED ORDER — CISATRACURIUM BESYLATE (PF) 10 MG/5ML IV SOLN
INTRAVENOUS | Status: DC | PRN
  Administered 2012-11-13: 10 mg via INTRAVENOUS

## 2012-11-13 MED ORDER — PANTOPRAZOLE SODIUM 40 MG PO TBEC
40.0000 mg | DELAYED_RELEASE_TABLET | Freq: Every day | ORAL | Status: DC
  Administered 2012-11-14 – 2012-11-17 (×4): 40 mg via ORAL
  Filled 2012-11-13 (×4): qty 1

## 2012-11-13 MED ORDER — HYDROMORPHONE HCL PF 1 MG/ML IJ SOLN
INTRAMUSCULAR | Status: AC
  Filled 2012-11-13: qty 1

## 2012-11-13 MED ORDER — THROMBIN 5000 UNITS EX SOLR
CUTANEOUS | Status: AC
  Filled 2012-11-13: qty 10000

## 2012-11-13 MED ORDER — NEOSTIGMINE METHYLSULFATE 1 MG/ML IJ SOLN
INTRAMUSCULAR | Status: DC | PRN
  Administered 2012-11-13: 4 mg via INTRAVENOUS

## 2012-11-13 MED ORDER — SIMVASTATIN 20 MG PO TABS
20.0000 mg | ORAL_TABLET | Freq: Every day | ORAL | Status: DC
  Administered 2012-11-14 – 2012-11-16 (×3): 20 mg via ORAL
  Filled 2012-11-13 (×4): qty 1

## 2012-11-13 MED ORDER — DIAZEPAM 5 MG/ML IJ SOLN
INTRAMUSCULAR | Status: AC
  Filled 2012-11-13: qty 2

## 2012-11-13 SURGICAL SUPPLY — 52 items
BAG SPEC THK2 15X12 ZIP CLS (MISCELLANEOUS) ×1
BAG ZIPLOCK 12X15 (MISCELLANEOUS) ×2 IMPLANT
BENZOIN TINCTURE PRP APPL 2/3 (GAUZE/BANDAGES/DRESSINGS) ×4 IMPLANT
CHLORAPREP W/TINT 26ML (MISCELLANEOUS) IMPLANT
CLEANER TIP ELECTROSURG 2X2 (MISCELLANEOUS) ×2 IMPLANT
CLOTH BEACON ORANGE TIMEOUT ST (SAFETY) ×2 IMPLANT
DECANTER SPIKE VIAL GLASS SM (MISCELLANEOUS) ×2 IMPLANT
DRAPE LG THREE QUARTER DISP (DRAPES) ×6 IMPLANT
DRAPE MICROSCOPE LEICA (MISCELLANEOUS) ×2 IMPLANT
DRAPE POUCH INSTRU U-SHP 10X18 (DRAPES) ×2 IMPLANT
DRAPE SURG 17X11 SM STRL (DRAPES) ×2 IMPLANT
DRSG AQUACEL AG ADV 3.5X 6 (GAUZE/BANDAGES/DRESSINGS) ×2 IMPLANT
DRSG EMULSION OIL 3X3 NADH (GAUZE/BANDAGES/DRESSINGS) IMPLANT
DRSG PAD ABDOMINAL 8X10 ST (GAUZE/BANDAGES/DRESSINGS) IMPLANT
DRSG TELFA 4X5 ISLAND ADH (GAUZE/BANDAGES/DRESSINGS) IMPLANT
DURAPREP 26ML APPLICATOR (WOUND CARE) ×2 IMPLANT
ELECT REM PT RETURN 9FT ADLT (ELECTROSURGICAL) ×2
ELECTRODE REM PT RTRN 9FT ADLT (ELECTROSURGICAL) ×1 IMPLANT
GLOVE BIOGEL PI IND STRL 7.5 (GLOVE) ×1 IMPLANT
GLOVE BIOGEL PI IND STRL 8 (GLOVE) ×1 IMPLANT
GLOVE BIOGEL PI INDICATOR 7.5 (GLOVE) ×1
GLOVE BIOGEL PI INDICATOR 8 (GLOVE) ×1
GLOVE SURG SS PI 7.5 STRL IVOR (GLOVE) ×10 IMPLANT
GLOVE SURG SS PI 8.0 STRL IVOR (GLOVE) ×4 IMPLANT
GOWN BRE IMP PREV XXLGXLNG (GOWN DISPOSABLE) ×2 IMPLANT
GOWN PREVENTION PLUS LG XLONG (DISPOSABLE) IMPLANT
GOWN STRL REIN XL XLG (GOWN DISPOSABLE) ×6 IMPLANT
KIT BASIN OR (CUSTOM PROCEDURE TRAY) ×2 IMPLANT
KIT POSITIONING SURG ANDREWS (MISCELLANEOUS) ×2 IMPLANT
MANIFOLD NEPTUNE II (INSTRUMENTS) ×2 IMPLANT
NEEDLE SPNL 18GX3.5 QUINCKE PK (NEEDLE) ×6 IMPLANT
PATTIES SURGICAL .5 X.5 (GAUZE/BANDAGES/DRESSINGS) IMPLANT
PATTIES SURGICAL .75X.75 (GAUZE/BANDAGES/DRESSINGS) IMPLANT
PATTIES SURGICAL 1X1 (DISPOSABLE) IMPLANT
SLEEVE SURGEON STRL (DRAPES) ×2 IMPLANT
SPONGE SURGIFOAM ABS GEL 100 (HEMOSTASIS) ×2 IMPLANT
STAPLER VISISTAT (STAPLE) ×2 IMPLANT
STRIP CLOSURE SKIN 1/2X4 (GAUZE/BANDAGES/DRESSINGS) IMPLANT
SUT PROLENE 3 0 PS 2 (SUTURE) IMPLANT
SUT VIC AB 0 CT1 27 (SUTURE)
SUT VIC AB 0 CT1 27XBRD ANTBC (SUTURE) IMPLANT
SUT VIC AB 1 CT1 27 (SUTURE) ×4
SUT VIC AB 1 CT1 27XBRD ANTBC (SUTURE) ×2 IMPLANT
SUT VIC AB 1-0 CT2 27 (SUTURE) ×4 IMPLANT
SUT VIC AB 2-0 CT1 27 (SUTURE) ×2
SUT VIC AB 2-0 CT1 TAPERPNT 27 (SUTURE) ×1 IMPLANT
SUT VIC AB 2-0 CT2 27 (SUTURE) ×4 IMPLANT
SUT VICRYL 0 27 CT2 27 ABS (SUTURE) IMPLANT
SUT VICRYL 0 UR6 27IN ABS (SUTURE) IMPLANT
SYRINGE 10CC LL (SYRINGE) ×4 IMPLANT
TRAY LAMINECTOMY (CUSTOM PROCEDURE TRAY) ×2 IMPLANT
YANKAUER SUCT BULB TIP NO VENT (SUCTIONS) ×2 IMPLANT

## 2012-11-13 NOTE — Progress Notes (Addendum)
Have noted prn med (percocet) ordered for pt and pt is recorded allergic to codeine. Will page PA on call & advise of this. Brayli Klingbeil, [employer]

## 2012-11-13 NOTE — Anesthesia Preprocedure Evaluation (Addendum)
Anesthesia Evaluation  Patient identified by MRN, date of birth, ID band Patient awake    Reviewed: Allergy & Precautions, H&P , NPO status , Patient's Chart, lab work & pertinent test results  Airway Mallampati: II TM Distance: >3 FB Neck ROM: full    Dental no notable dental hx. (+) Teeth Intact and Dental Advisory Given   Pulmonary neg pulmonary ROS,  breath sounds clear to auscultation  Pulmonary exam normal       Cardiovascular hypertension, Pt. on medications + CAD Rhythm:regular Rate:Normal  Non-obstructive CAD on cath 2009.  Severe dizziness with chronic ischemic changes on MRI.  Orthostatic hypotension.   Neuro/Psych negative neurological ROS  negative psych ROS   GI/Hepatic negative GI ROS, Neg liver ROS, GERD-  Medicated and Controlled,  Endo/Other  negative endocrine ROS  Renal/GU negative Renal ROS  negative genitourinary   Musculoskeletal   Abdominal   Peds  Hematology negative hematology ROS (+)   Anesthesia Other Findings   Reproductive/Obstetrics negative OB ROS                          Anesthesia Physical Anesthesia Plan  ASA: III  Anesthesia Plan: General   Post-op Pain Management:    Induction: Intravenous  Airway Management Planned: Oral ETT  Additional Equipment:   Intra-op Plan:   Post-operative Plan: Extubation in OR  Informed Consent: I have reviewed the patients History and Physical, chart, labs and discussed the procedure including the risks, benefits and alternatives for the proposed anesthesia with the patient or authorized representative who has indicated his/her understanding and acceptance.   Dental Advisory Given  Plan Discussed with: CRNA and Surgeon  Anesthesia Plan Comments:         Anesthesia Quick Evaluation

## 2012-11-13 NOTE — Transfer of Care (Signed)
Immediate Anesthesia Transfer of Care Note  Patient: [name]  Procedure(s) Performed: Procedure(s): MICRO LUMBAR DECOMPRESSION L4 - L5 AND L2 - L3 2 LEVELS (N/A)  Patient Location: PACU  Anesthesia Type:General  Level of Consciousness: awake, alert , oriented and patient cooperative  Airway & Oxygen Therapy: Patient Spontanous Breathing and Patient connected to face mask oxygen  Post-op Assessment: Report given to PACU RN, Post -op Vital signs reviewed and stable and Patient moving all extremities  Post vital signs: Reviewed and stable  Complications: No apparent anesthesia complications

## 2012-11-13 NOTE — Brief Op Note (Signed)
11/13/2012  4:14 PM  PATIENT:  [name]  68 y.o. 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  PROCEDURE:  Procedure(s): MICRO LUMBAR DECOMPRESSION L4 - L5 AND L2 - L3 2 LEVELS (N/A)  SURGEON:  Surgeon(s) and Role:    * Javier Docker, MD - Primary  PHYSICIAN ASSISTANT:   ASSISTANTS: Bissell   ANESTHESIA:   general  EBL:  Total I/O In: 1000 [I.V.:1000] Out: 355 [Urine:325; Blood:30]  BLOOD ADMINISTERED:none  DRAINS: none   LOCAL MEDICATIONS USED:  MARCAINE     SPECIMEN:  No Specimen  DISPOSITION OF SPECIMEN:  N/A  COUNTS:  YES  TOURNIQUET:  * No tourniquets in log *  DICTATION: .Other Dictation: Dictation Number [id number]  PLAN OF CARE: Admit for overnight observation  PATIENT DISPOSITION:  PACU - hemodynamically stable.   Delay start of Pharmacological VTE agent (>24hrs) due to surgical blood loss or risk of bleeding: yes

## 2012-11-13 NOTE — Progress Notes (Signed)
Irish Elders, PA, returned call & will leave med as ordered for now. Leng Montesdeoca, [employer]

## 2012-11-13 NOTE — Preoperative (Signed)
Beta Blockers   Reason not to administer Beta Blockers:Not Applicable 

## 2012-11-13 NOTE — Anesthesia Postprocedure Evaluation (Signed)
  Anesthesia Post-op Note  Patient: [name]  Procedure(s) Performed: Procedure(s) (LRB): MICRO LUMBAR DECOMPRESSION L4 - L5 AND L2 - L3 2 LEVELS (N/A)  Patient Location: PACU  Anesthesia Type: General  Level of Consciousness: awake and alert   Airway and Oxygen Therapy: Patient Spontanous Breathing  Post-op Pain: mild  Post-op Assessment: Post-op Vital signs reviewed, Patient's Cardiovascular Status Stable, Respiratory Function Stable, Patent Airway and No signs of Nausea or vomiting  Last Vitals:  Filed Vitals:   11/13/12 1715  BP: 112/52  Pulse: 76  Temp:   Resp: 11    Post-op Vital Signs: stable   Complications: No apparent anesthesia complications

## 2012-11-13 NOTE — H&P (View-Only) (Signed)
[name] is an 67 y.o. female.   Chief Complaint: back and right leg pain HPI: Hx ongoing LBP intermittent, episodic x 5-6 years. C/o increased pain approx 6 months back and right leg pain, numbness and leg pain (right). The patient states that they are doing 75 percent better (left sided symptoms improved). Current treatment includes: NSAIDs and pain medications (Ultram and Celebrex). Prior ESI's with temporary relief. Pain refractory to most recent ESI left L3-4 (Nov. 2013), Pt states the ESI helped with her L sided pain but she continues to have R sided LBP which is radiating into the R lateral thigh. She still notes numbness and tingling in the R thigh. No bowel or bladder incontinence. R leg continues to feel weak. She has to lay in the bed for relief. Past Medical History  Diagnosis Date  . Anxiety and depression   . Eczema   . Hypertension   . Goiter   . Dizziness     severe: MRI chronic isch. changes and mastoiditis- saw Dr.Mundy(2007)  STATES SHE STILL HAS EPISODES- ESPECIALLY IF SHE GETS UP TOO QUICKLY AFTER LYING DOWN  . S/P cardiac cath 2009    Revealing nonobstructive CAD w/ tubular, discrete 40% mid lesion in the circumflex; discrete 30% proximal lesion in the LAD; luminal irregularities, 35% mid lesion in RCA; and generic 40% mid lesion in the RCA. Medical treatment recommended.  . Hyperlipidemia   . GERD (gastroesophageal reflux disease)   . Headache   . Arthritis   . Pain     PAIN LOWER BACK AND DOWN RIGHT LEG WITH NUMBNESS, TINGLING RT LEG AND FOOT--STENOSIS  . Coronary artery disease     Past Surgical History  Procedure Laterality Date  . Cholecystectomy    . Abdominal hysterectomy      oophorectomy L only  . Shoulder surgery  2006    left   . Hand surgery  1990s    R hand x 2, L hand x 1  . Bunionectomy  1990s    LEFT    Family History  Problem Relation Age of Onset  . Colon cancer Neg Hx   . Breast cancer      2 cousins  . Throat cancer       cousin  . Heart attack      several family memers  . Lung cancer Mother     cousin   Social History:  reports that she has quit smoking. She has never used smokeless tobacco. She reports that she does not drink alcohol or use illicit drugs.  Allergies:  Allergies  Allergen Reactions  . Sertraline Hcl Other (See Comments)     muscle aches, diarrhea, nausea  . Codeine Other (See Comments)      chest and stomach pain, severe abd cramping     (Not in a hospital admission)  No results found for this or any previous visit (from the past 48 hour(s)). No results found.  Review of Systems  Constitutional: Negative.   HENT: Negative.   Eyes: Negative.   Respiratory: Negative.   Cardiovascular: Negative.   Gastrointestinal: Negative.   Genitourinary: Negative.   Musculoskeletal: Positive for back pain.  Skin: Negative.   Neurological: Positive for sensory change and focal weakness.  Psychiatric/Behavioral: Negative.     There were no vitals taken for this visit. Physical Exam  Constitutional: She is oriented to person, place, and time. She appears well-developed and well-nourished.  HENT:  Head: Normocephalic and atraumatic.  Eyes: Conjunctivae   and EOM are normal. Pupils are equal, round, and reactive to light.  Neck: Normal range of motion. Neck supple.  Cardiovascular: Normal rate and regular rhythm.   Respiratory: Effort normal and breath sounds normal.  GI: Soft. Bowel sounds are normal.  Musculoskeletal:  She is upright in a seated position, minimal distress. Her straight leg raise produces buttock pain bilaterally and trace EHL weakness, pain with extension, relieved with forward flexion.  Lumbar spine exam reveals no evidence of soft tissue swelling, deformity or skin ecchymosis. On palpation there is no tenderness of the lumbar spine. No flank pain with percussion. The abdomen is soft and nontender. Nontender over the trochanters. No cellulitis or  lymphadenopathy.  Motor is 5/5 including tibialis anterior, plantar flexion, quadriceps and hamstrings. Patient is normoreflexic. There is no Babinski or clonus. Sensory exam is intact to light touch. Patient has good distal pulses. No DVT. No pain and normal range of motion without instability of the hips, knees and ankles.   Neurological: She is alert and oriented to person, place, and time. She has normal reflexes.  Skin: Skin is warm and dry.  Psychiatric: She has a normal mood and affect.    MRI and myelogram that demonstrates lateral recessed stenosis at 2-3 on the right with lateral bending and 4-5 bilaterally.  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 Currently with onset of left lower extremity symptoms. She has had no instability with flexion extension, years of duration. She has had epidurals, physical therapy and activity modification. She has asked for other options. We discussed lumbar decompression at 2-3 and 4-5. I do feel this delineates at least the pathology associated with neurocompression. She does have spondylosis; however, and I have indicated residual back pain.  I had an extensive discussion of the risks and benefits of the lumbar decompression with the patient including bleeding, infection, damage to neurovascular structures, epidural fibrosis, CSF leak requiring repair. We also discussed increase in pain, adjacent segment disease, recurrent disc herniation, need for future surgery including repeat decompression and/or fusion. We also discussed risks of postoperative hematoma, paralysis, anesthetic complications including DVT, PE, death, cardiopulmonary dysfunction. In addition, the perioperative and postoperative courses were discussed in detail including the rehabilitative time and return to functional activity and work. I provided the patient with an  illustrated handout and utilized the appropriate surgical models.    BISSELL, JACLYN M. for Dr. Beane 11/12/2012, 9:01 PM    

## 2012-11-13 NOTE — Interval H&P Note (Signed)
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 After consideration of risks, benefits and other options for treatment, the patient has consented to  Procedure(s): MICRO LUMBAR DECOMPRESSION L4 - L5 AND L2 - L3 2 LEVELS (N/A) as a surgical intervention .  [name] history has been reviewed, patient examined, no change in status, stable for surgery.  I have reviewed the patient's chart and labs.  Questions were answered to the patient's satisfaction.     [name] C

## 2012-11-14 ENCOUNTER — Encounter (HOSPITAL_COMMUNITY): Payer: Self-pay | Admitting: Specialist

## 2012-11-14 LAB — BASIC METABOLIC PANEL
Chloride: 101 mEq/L (ref 96–112)
GFR calc Af Amer: >90 mL/min (ref >90)
GFR calc non Af Amer: 88 mL/min — ABNORMAL LOW (ref >90)
Potassium: 3.8 mEq/L (ref 3.5–5.1)
Sodium: 136 mEq/L (ref 135–145)

## 2012-11-14 LAB — CBC
Hemoglobin: 12.2 g/dL (ref 12.0–15.0)
RBC: 3.84 MIL/uL — ABNORMAL LOW (ref 3.87–5.11)

## 2012-11-14 NOTE — Op Note (Signed)
[name], [name] NO.:  [id number]  MEDICAL RECORD NO.:  [id number]  LOCATION:  1613                         FACILITY:  [facility]  PHYSICIAN:  Jene Every, M.D.    DATE OF BIRTH:  23-Feb-1946  DATE OF PROCEDURE:  11/13/2012 DATE OF DISCHARGE:                              OPERATIVE REPORT   PREOPERATIVE DIAGNOSIS:  Spinal stenosis, L2-3 and L4-5.  POSTOPERATIVE DIAGNOSIS:  Spinal stenosis, L2-3 and L4-5.  PROCEDURE PERFORMED: 1. Microlumbar decompression L2-3 with foraminotomies of L2 and L3     bilaterally. 2. Microlumbar decompression L4-5 with foraminotomies of L4 and L5     bilaterally.  ANESTHESIA:  General.  ASSISTANT:  Judith Blonder, PA.  HISTORY:  This is a 67 year old, myelogram indicating lateral recess stenosis, L2-3 and L4-5.  She has right and left-sided symptoms, some weakness dermatome with dysesthesias despite rest, activity modification, therapy.  She was indicated for decompression.  Family consented for treatment.  Risks and benefits were discussed including bleeding, infection, DVT, PE, anesthetic complications, __________ worsening symptoms, need for fusion in future, etc.  TECHNIQUE:  With the patient in supine position, after induction of adequate anesthesia and 2 g Kefzol, placed prone on the [place] frame. All bony prominences were well padded.  Lumbar region prepped and draped in usual sterile fashion.  Two 18-gauge spinal needle was utilized, localized to L2-3 and L4-5 interspace.  Attention was turned towards L2- 3 at first.  The incision between the spinous processes of L2-3 subcutaneous tissue was dissected, electrocautery was utilized to achieve hemostasis.  Dorsolumbar fascia identified and divided in line with skin incision.  Paraspinous muscle elevated from lamina of L2-3, McCullough retractor was placed.  Operating microscope was draped on the surgical field.  Confirmatory radiograph was obtained.  Leksell  rongeur was utilized to remove the inferior edge of the 2, partial L3 spinous process.  Then used a 2-mm Kerrison, performed hemilaminotomies bilaterally of the caudad edge of 2 and cephalad edge of 3 after detaching the ligamentum flavum with a microcurette and placed a __________ beneath the ligamentum flavum.  Ligamentum flavum removed from the interspace bilaterally.  Hypertrophic ligamentum flavum, facet arthropathy was combined to compress the lateral recesses bilaterally at the L2 and L3 nerve roots.  We decompressed the lateral recess to the medial border of the pedicle.  Performed foraminotomies of L2 and L3 bilaterally.  She was stenotic bilaterally.  There was a hardened disk noted on either side of the thecal sac at L2 and L3 following decompression.  There was a hockey-stick probe passed freely up the foramen L2 and L3 bilaterally.  Good restoration of thecal sac.  No CSF leakage.  It is copiously irrigated.  Bone wax placed on the cancellous surfaces.  Thrombin-soaked Gelfoam placed in laminotomy defect.  We obtained a confirmatory radiograph.  The instruments in the foramen L2 and L3.  In a similar fashion, we approached L4 and L5, placed a McCullough retractor, removed the interspinous ligament __________ spinous process of L4 and L5.  Performed bilateral hemilaminotomies, ligamentum flavum, and __________ protecting the neural elements.  She had hypertrophic ligamentum flavum bilaterally as well.  __________ decompressed lateral recess to the medial border  of the pedicle. Performed foraminotomies of L4 and L5.  There was no disk herniation noted.  Bipolar electrocautery was utilized to achieve hemostasis.  Good restoration of thecal sac.  No CSF leakage or active bleeding.  We used thrombin-soaked Gelfoam and bone wax.  We obtained confirmatory radiographs.  Instruments in the foramen L4 and L5.  Wound was copiously irrigated, reinspected, no evidence of CSF leakage or  active bleeding. We repaired the dorsolumbar fascia.  Both incisions #1 Vicryl, interrupted figure-of-8 sutures, subcu with 2-0, the skin reapproximated with staples.  Both wounds dressed sterilely.  Placed supine on the hospital bed, extubated without difficulty, transported to recovery in satisfactory condition.  The patient tolerated the procedure well.  No complications.     Jene Every, M.D.     [name]  D:  11/13/2012  T:  11/14/2012  Job:  [id number]

## 2012-11-14 NOTE — Evaluation (Signed)
Occupational Therapy Evaluation Patient Details Name: [name] MRN: [id number] DOB: [birth year] Today's Date: 11/14/2012 Time: 4098-1191 OT Time Calculation (min): 23 min  OT Assessment / Plan / Recommendation Clinical Impression     This 67 y.o. Female admitted for L2-3 and L4-5 microdiscectomy and decompression.  Pt currently requires min A for BADLs, and mod cues for back precautions.  She will benefit from OT for the below listed deficits to maximize safety and independence with BADLs in order to return home at supervision level with spouse.      Patient needs continued OT Services    Follow Up Recommendations  No OT follow up;Supervision - Intermittent    Barriers to Discharge None    Equipment Recommendations  None recommended by OT    Recommendations for Other Services    Frequency  Min 2X/week    Precautions / Restrictions Precautions Precautions: Back Precaution Booklet Issued: Yes (comment) Precaution Comments: reviewed precautions with pt/spouse Restrictions Weight Bearing Restrictions: No       ADL  Upper Body Bathing: Supervision/safety Where Assessed - Upper Body Bathing: Unsupported sitting Lower Body Bathing: Minimal assistance Where Assessed - Lower Body Bathing: Supported sit to stand Upper Body Dressing: Supervision/safety Where Assessed - Upper Body Dressing: Unsupported sitting Lower Body Dressing: Minimal assistance Where Assessed - Lower Body Dressing: Supported sit to stand Toilet Transfer: Minimal assistance Toilet Transfer Method: Sit to Barista: Comfort height toilet Toileting - Architect and Hygiene: Min guard Where Assessed - Toileting Clothing Manipulation and Hygiene: Standing Transfers/Ambulation Related to ADLs: min guard assist ADL Comments: Pt able to cross ankles over knees to access feet for LB ADLs, but requires mod verbal cues for back precautions    OT Diagnosis: Generalized  weakness;Acute pain  OT Problem List: Decreased strength;Decreased activity tolerance;Pain;Obesity;Decreased knowledge of use of DME or AE;Decreased knowledge of precautions OT Treatment Interventions: Self-care/ADL training;Patient/family education;DME and/or AE instruction;Therapeutic activities   OT Goals Acute Rehab OT Goals OT Goal Formulation: With patient Time For Goal Achievement: 11/21/12 Potential to Achieve Goals: Good ADL Goals Pt Will Perform Grooming: with supervision;Standing at sink ADL Goal: Grooming - Progress: Goal set today Pt Will Perform Lower Body Bathing: with supervision;Sit to stand from chair;Sit to stand from bed ADL Goal: Lower Body Bathing - Progress: Goal set today Pt Will Perform Lower Body Dressing: with supervision;Sit to stand from chair;Sit to stand from bed ADL Goal: Lower Body Dressing - Progress: Goal set today Pt Will Transfer to Toilet: with supervision ADL Goal: Toilet Transfer - Progress: Goal set today Pt Will Perform Toileting - Clothing Manipulation: with supervision;Standing ADL Goal: Toileting - Clothing Manipulation - Progress: Goal set today Pt Will Perform Toileting - Hygiene: with supervision;Sit to stand from 3-in-1/toilet;with adaptive equipment ADL Goal: Toileting - Hygiene - Progress: Goal set today Additional ADL Goal #1: Pt will be independent with back precautions during all activity ADL Goal: Additional Goal #1 - Progress: Goal set today  Visit Information  Last OT Received On: 11/14/12 Assistance Needed: +1    Subjective Data  Subjective: "I need to learn this stuff"  re: back precautions Patient Stated Goal: To get better   Prior Functioning     Home Living Lives With: Spouse Available Help at Discharge: Family;Available 24 hours/day Type of Home: House Home Access: Stairs to enter;Ramped entrance Entrance Stairs-Number of Steps: 2 Home Layout: One level Bathroom Shower/Tub: Health visitor:  Standard Bathroom Accessibility: Yes How Accessible: Accessible via wheelchair Home  Adaptive Equipment: Engineer, drilling - four wheeled Prior Function Level of Independence: Independent Able to Take Stairs?: Yes Driving: Yes Communication Communication: No difficulties         Vision/Perception     Cognition  Cognition Arousal/Alertness: Awake/alert Behavior During Therapy: WFL for tasks assessed/performed Overall Cognitive Status: Within Functional Limits for tasks assessed    Extremity/Trunk Assessment Right Upper Extremity Assessment RUE ROM/Strength/Tone: WFL for tasks assessed RUE Coordination: WFL - gross/fine motor Left Upper Extremity Assessment LUE ROM/Strength/Tone: WFL for tasks assessed LUE Coordination: WFL - gross/fine motor     Mobility Bed Mobility Bed Mobility: Rolling Left;Left Sidelying to Sit Rolling Left: 5: Supervision;With rail Left Sidelying to Sit: 5: Supervision;With rails;HOB flat Sit to Sidelying Left: 5: Supervision;HOB flat;With rail Details for Bed Mobility Assistance: Pt requires mod vc's for back precautions and technique     Exercise     Balance     End of Session OT - End of Session Activity Tolerance: Patient limited by pain Patient left: in bed;with call bell/phone within reach Nurse Communication: Patient requests pain meds  GO Functional Limitation: Self care Self Care Current Status (Z6109): At least 20 percent but less than 40 percent impaired, limited or restricted Self Care Goal Status (U0454): At least 1 percent but less than 20 percent impaired, limited or restricted   [name] M 11/14/2012, 6:09 PM

## 2012-11-14 NOTE — Progress Notes (Signed)
[facility] to provide HHPT services which was pt's choice. No DME recommended by PT.

## 2012-11-14 NOTE — Progress Notes (Signed)
Subjective: 1 Day Post-Op Procedure(s) (LRB): MICRO LUMBAR DECOMPRESSION L4 - L5 AND L2 - L3 2 LEVELS (N/A) Patient reports pain as severe.  C/o severe LBP 10/10. Denies leg pain, numbness, tingling. At this point does not feel she will be able to D/C home today.  Objective: Vital signs in last 24 hours: Temp:  [97.4 F (36.3 C)-99.3 F (37.4 C)] 99.3 F (37.4 C) (04/25 0514) Pulse Rate:  [66-86] 67 (04/25 0514) Resp:  [8-17] 14 (04/25 0129) BP: (96-135)/(27-91) 105/77 mmHg (04/25 0514) SpO2:  [94 %-100 %] 99 % (04/25 0514) Weight:  [82.555 kg (182 lb)] 82.555 kg (182 lb) (04/24 1803)  Intake/Output from previous day: 04/24 0701 - 04/25 0700 In: 3077.5 [P.O.:100; I.V.:2977.5] Out: 2830 [Urine:2800; Blood:30] Intake/Output this shift: Total I/O In: 113.3 [I.V.:113.3] Out: -    Recent Labs  11/14/12 0500  HGB 12.2    Recent Labs  11/14/12 0500  WBC 8.9  RBC 3.84*  HCT 36.5  PLT 148*    Recent Labs  11/14/12 0500  NA 136  K 3.8  CL 101  CO2 30  BUN 6  CREATININE 0.67  GLUCOSE 101*  CALCIUM 8.6   No results found for this basename: LABPT, INR,  in the last 72 hours  Neurologically intact ABD soft Neurovascular intact Sensation intact distally Intact pulses distally Dorsiflexion/Plantar flexion intact Incision: dressing C/D/I and no drainage No cellulitis present Compartment soft No calf pain or sign of DVT  Assessment/Plan: 1 Day Post-Op Procedure(s) (LRB): MICRO LUMBAR DECOMPRESSION L4 - L5 AND L2 - L3 2 LEVELS (N/A) Advance diet Up with therapy D/C IV fluids D/C foley this AM Possible D/C later today if pain improved, otherwise plan for D/C tomorrow Will discuss with Dr. Elissa Lovett, Dayna Barker. 11/14/2012, 7:50 AM

## 2012-11-14 NOTE — Evaluation (Signed)
Physical Therapy Evaluation Patient Details Name: [name] MRN: [id number] DOB: 30-Nov-1945 Today's Date: 11/14/2012 Time: 1478-2956 PT Time Calculation (min): 39 min  PT Assessment / Plan / Recommendation Clinical Impression  67 y.o. female admitted for L2-L3 and L4-L5 microdiscectomy decompression. Pt at min assist level for mobility, she ambulated 48' with RW, distance limited by pain. HHPT recommended. She would benefit from acute PT to maximize safety and independence with mobillity.     PT Assessment  Patient needs continued PT services    Follow Up Recommendations  Home health PT    Does the patient have the potential to tolerate intense rehabilitation      Barriers to Discharge None      Equipment Recommendations  None recommended by PT    Recommendations for Other Services OT consult   Frequency Min 6X/week    Precautions / Restrictions Precautions Precautions: Back Precaution Booklet Issued: Yes (comment) Precaution Comments: reviewed precautions with pt/spouse Restrictions Weight Bearing Restrictions: No   Pertinent Vitals/Pain *5/10 surgical pain with walking premedicated**      Mobility  Bed Mobility Bed Mobility: Rolling Left;Left Sidelying to Sit Rolling Left: 4: Min assist Left Sidelying to Sit: 4: Min assist;With rails Details for Bed Mobility Assistance: VCs for set up, min A to elevate trunk Transfers Transfers: Sit to Stand;Stand to Sit Sit to Stand: From bed;From chair/3-in-1;With upper extremity assist;4: Min assist Stand to Sit: To chair/3-in-1;With upper extremity assist;4: Min guard Details for Transfer Assistance: VCs hand placement, min A to rise Ambulation/Gait Ambulation/Gait Assistance: 4: Min guard Ambulation Distance (Feet): 45 Feet Assistive device: Rolling walker Ambulation/Gait Assistance Details: 5/10 pain with walking Gait Pattern: Step-to pattern;Decreased step length - right;Decreased step length - left Gait  velocity: decreased    Exercises     PT Diagnosis: Difficulty walking;Acute pain  PT Problem List: Decreased activity tolerance;Pain;Decreased knowledge of use of DME;Decreased knowledge of precautions;Decreased mobility PT Treatment Interventions: Gait training;DME instruction;Functional mobility training;Patient/family education;Therapeutic activities   PT Goals Acute Rehab PT Goals PT Goal Formulation: With patient/family Time For Goal Achievement: 11/28/12 Potential to Achieve Goals: Good Pt will Roll Supine to Left Side: with modified independence PT Goal: Rolling Supine to Left Side - Progress: Goal set today Pt will go Supine/Side to Sit: with modified independence PT Goal: Supine/Side to Sit - Progress: Goal set today Pt will go Sit to Stand: with modified independence PT Goal: Sit to Stand - Progress: Goal set today Pt will Ambulate: 51 - 150 feet;with supervision;with rolling walker PT Goal: Ambulate - Progress: Goal set today  Visit Information  Last PT Received On: 11/14/12 Assistance Needed: +1    Subjective Data  Subjective: It hurts.  Patient Stated Goal: play with great grandchildren   Prior Functioning  Home Living Lives With: Spouse Available Help at Discharge: Family;Available 24 hours/day Type of Home: House Home Access: Stairs to enter;Ramped entrance Entrance Stairs-Number of Steps: 2 Home Layout: One level Bathroom Shower/Tub: Health visitor: Standard Home Adaptive Equipment: Engineer, drilling - four wheeled Prior Function Level of Independence: Independent Able to Take Stairs?: Yes Driving: Yes Communication Communication: No difficulties    Cognition  Cognition Arousal/Alertness: Awake/alert Behavior During Therapy: WFL for tasks assessed/performed Overall Cognitive Status: Within Functional Limits for tasks assessed    Extremity/Trunk Assessment Right Upper Extremity Assessment RUE ROM/Strength/Tone: [facility] for  tasks assessed Left Upper Extremity Assessment LUE ROM/Strength/Tone: WFL for tasks assessed Right Lower Extremity Assessment RLE ROM/Strength/Tone: Within functional levels RLE Sensation: WFL -  Light Touch RLE Coordination: WFL - gross/fine motor Left Lower Extremity Assessment LLE ROM/Strength/Tone: Within functional levels LLE Sensation: WFL - Light Touch LLE Coordination: WFL - gross/fine motor Trunk Assessment Trunk Assessment: Normal   Balance    End of Session PT - End of Session Equipment Utilized During Treatment: Gait belt Activity Tolerance: Patient limited by pain Patient left: in chair;with call bell/phone within reach;with family/visitor present Nurse Communication: Mobility status  GP Functional Assessment Tool Used: clinical judgement Functional Limitation: Mobility: Walking and moving around Mobility: Walking and Moving Around Current Status [phone]): At least 20 percent but less than 40 percent impaired, limited or restricted Mobility: Walking and Moving Around Goal Status [phone]): At least 1 percent but less than 20 percent impaired, limited or restricted   Tamala Ser 11/14/2012, 11:36 AM [phone]

## 2012-11-15 NOTE — Progress Notes (Signed)
OT Cancellation Note  Patient Details Name: ADELIZ TONKINSON MRN: [id number] DOB: 1945/11/11   Cancelled Treatment:    Reason Eval/Treat Not Completed: Pain limiting ability to participate;Other (comment) (pt also with nausea) Pt states nursing aware. Pt would like a 3in1 for discharge home.   Lennox Laity [phone] 11/15/2012, 3:08 PM

## 2012-11-15 NOTE — Progress Notes (Signed)
Physical Therapy Treatment Patient Details Name: [name] MRN: [id number] DOB: 01/26/1946 Today's Date: 11/15/2012 Time: 1191-4782 PT Time Calculation (min): 30 min  PT Assessment / Plan / Recommendation Comments on Treatment Session  pt progressing, still concerned about going home tomorrow; Will see in am; Husband present for tx session this pm    Follow Up Recommendations  Home health PT     Does the patient have the potential to tolerate intense rehabilitation     Barriers to Discharge        Equipment Recommendations  None recommended by PT    Recommendations for Other Services    Frequency Min 6X/week   Plan Discharge plan remains appropriate;Frequency remains appropriate    Precautions / Restrictions Precautions Precautions: Back Precaution Comments: reviewed precautions with pt/spouse Restrictions Other Position/Activity Restrictions: pt able to recall  2 of 3 back precautions   Pertinent Vitals/Pain C/o pain back, mostly soreness Pt was premedicated    Mobility  Bed Mobility Bed Mobility: Rolling Left;Left Sidelying to Sit Rolling Left: 4: Min guard;With rail Left Sidelying to Sit: 4: Min guard;With rails;HOB flat Sit to Sidelying Left: 4: Min assist Details for Bed Mobility Assistance: Pt requires mod vc's for back precautions and technique Transfers Transfers: Sit to Stand;Stand to Sit Sit to Stand: From bed;From chair/3-in-1;With upper extremity assist;4: Min guard Stand to Sit: 4: Min guard;To chair/3-in-1;To bed;With upper extremity assist Details for Transfer Assistance: VCs hand placement, min A to rise Ambulation/Gait Ambulation/Gait Assistance: 4: Min guard Ambulation Distance (Feet): 100 Feet Assistive device: Rolling walker Ambulation/Gait Assistance Details: cues for posture, RW position from self Gait Pattern: Step-to pattern;Decreased stride length    Exercises General Exercises - Lower Extremity Ankle Circles/Pumps: AROM;Both;10  reps Quad Sets: AROM;Both;10 reps   PT Diagnosis:    PT Problem List:   PT Treatment Interventions:     PT Goals Acute Rehab PT Goals Time For Goal Achievement: 11/28/12 Potential to Achieve Goals: Good Pt will Roll Supine to Left Side: with modified independence PT Goal: Rolling Supine to Left Side - Progress: Progressing toward goal Pt will go Supine/Side to Sit: with modified independence PT Goal: Supine/Side to Sit - Progress: Progressing toward goal Pt will go Sit to Stand: with modified independence PT Goal: Sit to Stand - Progress: Progressing toward goal Pt will Ambulate: 51 - 150 feet;with supervision;with rolling walker PT Goal: Ambulate - Progress: Progressing toward goal  Visit Information  Last PT Received On: 11/15/12 Assistance Needed: +1    Subjective Data  Subjective: I will try   Cognition  Cognition Arousal/Alertness: Awake/alert Behavior During Therapy: WFL for tasks assessed/performed Overall Cognitive Status: Within Functional Limits for tasks assessed    Balance     End of Session PT - End of Session Equipment Utilized During Treatment: Gait belt Activity Tolerance: Patient tolerated treatment well Patient left: in bed;with call bell/phone within reach;with family/visitor present Nurse Communication: Other (comment) (called to change linen)   GP     [facility] 11/15/2012, 5:33 PM

## 2012-11-15 NOTE — Progress Notes (Signed)
Utilization review completed.  P.J. Presli Fanguy,RN,BSN Case Manager [phone]  

## 2012-11-15 NOTE — Progress Notes (Signed)
Subjective: 2 Days Post-Op Procedure(s) (LRB): MICRO LUMBAR DECOMPRESSION L4 - L5 AND L2 - L3 2 LEVELS (N/Copeland) Patient reports pain as 3 on 0-10 scale. No major problems today.Needs More PT. Has been up in chair.   Objective: Vital signs in last 24 hours: Temp:  [98.4 F (36.9 C)-100.1 F (37.8 C)] 98.8 F (37.1 C) (04/26 1001) Pulse Rate:  [65-85] 75 (04/26 1001) Resp:  [14-16] 16 (04/26 1001) BP: (92-105)/(53-67) 92/53 mmHg (04/26 1001) SpO2:  [92 %-97 %] 93 % (04/26 1001)  Intake/Output from previous day: 04/25 0701 - 04/26 0700 In: 926.7 [P.O.:360; I.V.:566.7] Out: 1500 [Urine:1500] Intake/Output this shift:     Recent Labs  11/14/12 0500  HGB 12.2    Recent Labs  11/14/12 0500  WBC 8.9  RBC 3.84*  HCT 36.5  PLT 148*    Recent Labs  11/14/12 0500  NA 136  K 3.8  CL 101  CO2 30  BUN 6  CREATININE 0.67  GLUCOSE 101*  CALCIUM 8.6   No results found for this basename: LABPT, INR,  in the last 72 hours  Neurologically intact  Assessment/Plan: 2 Days Post-Op Procedure(s) (LRB): MICRO LUMBAR DECOMPRESSION L4 - L5 AND L2 - L3 2 LEVELS (N/Copeland) Up with therapy  [name] 11/15/2012, 10:04 AM

## 2012-11-16 NOTE — Progress Notes (Addendum)
Occupational Therapy Treatment Patient Details Name: [name] MRN: [id number] DOB: [birth year] Today's Date: 11/16/2012 Time: 4098-1191 OT Time Calculation (min): 28 min  OT Assessment / Plan / Recommendation Comments on Treatment Session  Pt moving better today.  Pt demonstrates improved safety awareness although she still needs min verbal cues for back precautions.     Follow Up Recommendations  No OT follow up;Supervision - Intermittent    Barriers to Discharge       Equipment Recommendations  3 in 1 bedside comode    Recommendations for Other Services    Frequency Min 2X/week   Plan Discharge plan remains appropriate    Precautions / Restrictions Precautions Precautions: Back Precaution Booklet Issued: Yes (comment) Precaution Comments: Pt able to state 2/3 precautions.  Requires min verbal cues to reinforce during activities Restrictions Weight Bearing Restrictions: No   Pertinent Vitals/Pain     ADL  Lower Body Bathing: Min guard Where Assessed - Lower Body Bathing: Supported sit to stand Lower Body Dressing: Min guard Where Assessed - Lower Body Dressing: Supported sit to stand Toilet Transfer: Hydrographic surveyor Method: Sit to Barista: Comfort height toilet;Grab bars Toileting - Architect and Hygiene: Min guard Where Assessed - Engineer, mining and Hygiene: Standing Transfers/Ambulation Related to ADLs: min guard assist ADL Comments:  Pt less impulsive today.  Requires min verbal cues for back precautions with ADLs.  Pt. able to cross ankles over knees safely.  Pt. able to peform peri hygiene seated on comfort height commode, unable to do so on 3-in-1 due to inability to adequately spread legs for access.  Pt is dependent on use of grab bar for sit to stand.  Discussed options with pt. - having pt install grab bar before tomorrow (next to toilet); or using 3-in-1 commode with use of toileting aid.   discussed options for toileting aids.  She will discuss it with husband tonight. (Pt instructed in use and acquisition of reacher)    OT Diagnosis:    OT Problem List:   OT Treatment Interventions:     OT Goals Acute Rehab OT Goals Time For Goal Achievement: 11/21/12 ADL Goals Pt Will Perform Grooming: with supervision;Standing at sink Pt Will Perform Lower Body Bathing: with supervision;Sit to stand from chair;Sit to stand from bed ADL Goal: Lower Body Bathing - Progress: Progressing toward goals Pt Will Perform Lower Body Dressing: with supervision;Sit to stand from chair;Sit to stand from bed ADL Goal: Lower Body Dressing - Progress: Progressing toward goals Pt Will Transfer to Toilet: with supervision ADL Goal: Toilet Transfer - Progress: Progressing toward goals Pt Will Perform Toileting - Clothing Manipulation: with supervision;Standing ADL Goal: Toileting - Clothing Manipulation - Progress: Progressing toward goals Pt Will Perform Toileting - Hygiene: with supervision;Sit to stand from 3-in-1/toilet;with adaptive equipment ADL Goal: Toileting - Hygiene - Progress: Progressing toward goals Additional ADL Goal #1: Pt will be independent with back precautions during all activity ADL Goal: Additional Goal #1 - Progress: Progressing toward goals  Visit Information  Last OT Received On: 11/16/12 Assistance Needed: +1    Subjective Data      Prior Functioning       Cognition  Cognition Arousal/Alertness: Awake/alert Behavior During Therapy: WFL for tasks assessed/performed Overall Cognitive Status: Within Functional Limits for tasks assessed    Mobility  Bed Mobility Bed Mobility: Rolling Left;Left Sidelying to Sit;Sitting - Scoot to Edge of Bed Rolling Left: 5: Supervision;With rail Left Sidelying to Sit: 4: Min  assist;With rails;HOB flat Sitting - Scoot to Edge of Bed: 5: Supervision Details for Bed Mobility Assistance: Pt heavily dependent on rails.  Pt. requires min  verbal cues for precautions.  required min A for sidelying to sit due to increased pain Transfers Transfers: Sit to Stand;Stand to Sit Sit to Stand: 4: Min guard;With upper extremity assist;From bed;From toilet Stand to Sit: 4: Min guard;With upper extremity assist;To chair/3-in-1;To toilet Details for Transfer Assistance: vc's for hand placement    Exercises      Balance     End of Session OT - End of Session Activity Tolerance: Patient tolerated treatment well Patient left: in chair;with call bell/phone within reach  GO     Nayshawn Mesta M 11/16/2012, 10:08 AM

## 2012-11-16 NOTE — Progress Notes (Signed)
   Subjective: 3 Days Post-Op Procedure(s) (LRB): MICRO LUMBAR DECOMPRESSION L4 - L5 AND L2 - L3 2 LEVELS (N/A)  Pt c/o mild to moderate soreness in the back today Pain is worse with movement and getting out of bed but getting better daily  Patient reports pain as mild.  Objective:   VITALS:   Filed Vitals:   11/16/12 0507  BP: 138/67  Pulse: 74  Temp: 98.3 F (36.8 C)  Resp: 16    Lumbar incision healing well nv intact distally Dressing per nursing  LABS  Recent Labs  11/14/12 0500  HGB 12.2  HCT 36.5  WBC 8.9  PLT 148*     Recent Labs  11/14/12 0500  NA 136  K 3.8  BUN 6  CREATININE 0.67  GLUCOSE 101*     Assessment/Plan: 3 Days Post-Op Procedure(s) (LRB): MICRO LUMBAR DECOMPRESSION L4 - L5 AND L2 - L3 2 LEVELS (N/A)   Plan for d/c home tomorrow Pt in agreement Continue with therapy  Pain control as needed   Alphonsa Overall, MPAS, PA-C  11/16/2012, 8:21 AM

## 2012-11-16 NOTE — Progress Notes (Signed)
Physical Therapy Treatment Patient Details Name: [name] MRN: [id number] DOB: Sep 05, 1945 Today's Date: 11/16/2012 Time: 4098-1191 PT Time Calculation (min): 15 min  PT Assessment / Plan / Recommendation Comments on Treatment Session  Progressing slowly. Most difficulty is with bed mobility. Positioned pt in sidelying with pillow between knees. Plan is for d/c home tomorrow. Recommend HHPT.     Follow Up Recommendations  Home health PT     Does the patient have the potential to tolerate intense rehabilitation     Barriers to Discharge        Equipment Recommendations  None recommended by PT    Recommendations for Other Services OT consult  Frequency Min 6X/week   Plan Discharge plan remains appropriate    Precautions / Restrictions Precautions Precautions: Back Precaution Booklet Issued: Yes (comment) Precaution Comments: Pt able to state 2/3 precautions.  Requires min verbal cues to reinforce during activities Restrictions Weight Bearing Restrictions: No   Pertinent Vitals/Pain 7/10 back with activity; pt reports 0/10 pain at rest    Mobility  Bed Mobility Bed Mobility: Rolling Left Rolling Left: 4: Min guard Left Sidelying to Sit: 4: Min assist;HOB flat;With rails Sit to Sidelying Left: 4: Min assist;HOB flat;With rail Details for Bed Mobility Assistance: Assist for trunk to upright and bil LEs onto bed. VCs safety, technique, hand placement Transfers Transfers: Sit to Stand;Stand to Sit Sit to Stand: 4: Min guard;From bed;From toilet Stand to Sit: 4: Min guard;To toilet;To bed Details for Transfer Assistance: vc's for hand placement and mod vc's for back precautions Ambulation/Gait Ambulation/Gait Assistance: 4: Min guard Ambulation Distance (Feet): 110 Feet Assistive device: Rolling walker Ambulation/Gait Assistance Details: Pt weaving with RW intermittently. Stop and start gait pattern at times Gait Pattern: Step-to pattern;Decreased stride length     Exercises     PT Diagnosis:    PT Problem List:   PT Treatment Interventions:     PT Goals Acute Rehab PT Goals Pt will Roll Supine to Left Side: with modified independence PT Goal: Rolling Supine to Left Side - Progress: Progressing toward goal Pt will go Supine/Side to Sit: with modified independence PT Goal: Supine/Side to Sit - Progress: Progressing toward goal Pt will go Sit to Stand: with modified independence PT Goal: Sit to Stand - Progress: Progressing toward goal Pt will Ambulate: 51 - 150 feet;with supervision;with rolling walker PT Goal: Ambulate - Progress: Progressing toward goal  Visit Information  Last PT Received On: 11/16/12 Assistance Needed: +1    Subjective Data  Subjective: It hurts like the dickens when I move Patient Stated Goal: home. regain independence. less pain.   Cognition  Cognition Arousal/Alertness: Awake/alert Behavior During Therapy: WFL for tasks assessed/performed Overall Cognitive Status: Within Functional Limits for tasks assessed    Balance     End of Session PT - End of Session Activity Tolerance: Patient tolerated treatment well Patient left: in bed;with call bell/phone within reach;with family/visitor present   GP     Rebeca Alert, MPT Pager: [phone]

## 2012-11-16 NOTE — Progress Notes (Signed)
Occupational Therapy   11/16/12 1300  OT Visit Information  Last OT Received On 11/16/12  Assistance Needed +1  OT Time Calculation  OT Start Time 1024  OT Stop Time 1033  OT Time Calculation (min) 9 min  Precautions  Precautions Back  Precaution Booklet Issued Yes (comment)  Precaution Comments Pt able to state 2/3 precautions.  Requires min verbal cues to reinforce during activities  Restrictions  Weight Bearing Restrictions No  ADL  Transfers/Ambulation Related to ADLs min guard assist  ADL Comments Pt. waved therapist down to assist her back to bed.  Pt. required mod verbal cues for back precautions when moving from recliner to bed  Cognition  Arousal/Alertness Awake/alert  Behavior During Therapy [facility] for tasks assessed/performed  Overall Cognitive Status Within Functional Limits for tasks assessed  Bed Mobility  Bed Mobility Sit to Sidelying Left  Sit to Sidelying Left 5: Supervision;With rail;HOB flat  Details for Bed Mobility Assistance heavy use of rails  Transfers  Transfers Sit to Stand;Stand to Sit  Sit to Stand 4: Min guard;With upper extremity assist;From chair/3-in-1  Stand to Sit 4: Min guard;With upper extremity assist;To bed  Details for Transfer Assistance vc's for hand placement and mod vc's for back precautions  OT - End of Session  Activity Tolerance Patient tolerated treatment well  Patient left in bed;with call bell/phone within reach  OT Assessment/Plan  OT Plan Discharge plan remains appropriate  OT Frequency Min 2X/week  Follow Up Recommendations No OT follow up;Supervision - Intermittent  OT Equipment 3 in 1 bedside comode  Acute Rehab OT Goals  OT Goal Formulation With patient  Time For Goal Achievement 11/21/12  Potential to Achieve Goals Good  ADL Goals  Pt Will Perform Grooming with supervision;Standing at sink  Pt Will Perform Lower Body Bathing with supervision;Sit to stand from chair;Sit to stand from bed  Pt Will Perform Lower Body  Dressing with supervision;Sit to stand from chair;Sit to stand from bed  Pt Will Transfer to Toilet with supervision  Pt Will Perform Toileting - Clothing Manipulation with supervision;Standing  Pt Will Perform Toileting - Hygiene with supervision;Sit to stand from 3-in-1/toilet;with adaptive equipment  Additional ADL Goal #1 Pt will be independent with back precautions during all activity  ADL Goal: Additional Goal #1 - Progress Progressing toward goals  OT General Charges  $OT Visit 1 Procedure  OT Treatments  $Therapeutic Activity 8-22 mins  [employer], OTR/L [phone]

## 2012-11-17 NOTE — Progress Notes (Signed)
Occupational Therapy Treatment Patient Details Name: [name] MRN: [id number] DOB: 08/24/1945 Today's Date: 11/17/2012 Time: 4098-1191 OT Time Calculation (min): 26 min  OT Assessment / Plan / Recommendation Comments on Treatment Session Pt tolerated session well and further educated pt on back precautions as she doesnt consistenly follow. Recommend HHOT to reinforce precautions further.     Follow Up Recommendations  Home health OT;Supervision/Assistance - 24 hour    Barriers to Discharge       Equipment Recommendations  3 in 1 bedside comode    Recommendations for Other Services    Frequency Min 2X/week   Plan Discharge plan needs to be updated    Precautions / Restrictions Precautions Precautions: Back Precaution Booklet Issued: Yes (comment) Precaution Comments: reinforced back precautions during session Restrictions Weight Bearing Restrictions: No        ADL  Toilet Transfer: Performed;Min guard Toilet Transfer Method: Other (comment) (with walker into bathroom ) Toilet Transfer Equipment: Comfort height toilet;Grab bars Equipment Used: Rolling walker Transfers/Ambulation Related to ADLs: Discussed 3in1 use versus comfort height commode with installation of a grab bar for home. Pt states she would like a 3in1 to start with to be able to use bilateral armrests and then use toilet aid for hygiene and progress to comfort height commode with a grab bar being installed by husband.  ADL Comments: Reviewed back precautions during session. Pt still having difficulty adhering to all precautions consistently during session. She tried to twist to turn and throw toilet tissue into commode. She also tried to wash hands standing sideways at the sink. Had pt turn and face sink to wash hands and emphasized making sure she faces sink or other surface she is accessing. Also reminded her to not twist to throw tissue away at toilet. Discussed toilet tongs for hygiene and pt didnt feel  she needed to simulat practice further with that.      OT Diagnosis:    OT Problem List:   OT Treatment Interventions:     OT Goals    Visit Information  Last OT Received On: 11/17/12 Assistance Needed: +1    Subjective Data  Subjective: I can go into the bathroom Patient Stated Goal: wants to get back to do things independently   Prior Functioning       Cognition  Cognition Arousal/Alertness: Awake/alert Behavior During Therapy: WFL for tasks assessed/performed Overall Cognitive Status: Within Functional Limits for tasks assessed    Mobility  Bed Mobility Bed Mobility: Left Sidelying to Sit Left Sidelying to Sit: HOB flat;With rails;6: Modified independent (Device/Increase time) Transfers Transfers: Sit to Stand;Stand to Sit Sit to Stand: 4: Min guard;With upper extremity assist;From toilet;From chair/3-in-1 Stand to Sit: 4: Min guard;To toilet;To chair/3-in-1;With upper extremity assist Details for Transfer Assistance: verbal cues for hand placement and back precautions.    Exercises  General Exercises - Lower Extremity Ankle Circles/Pumps: AROM;Both;10 reps Long Arc Quad: AROM;Both;5 reps;Seated   Balance     End of Session OT - End of Session Activity Tolerance: Patient tolerated treatment well Patient left: in chair;with call bell/phone within reach  GO     Lennox Laity  [phone] 11/17/2012, 10:15 AM

## 2012-11-17 NOTE — Care Management Note (Signed)
    Page 1 of 1   11/17/2012     8:53:44 AM   CARE MANAGEMENT NOTE 11/17/2012  Patient:  [name], [name]   Account Number:  [id number]  Date Initiated:  11/14/2012  Documentation initiated by:  [facility]  Subjective/Objective Assessment:   67 year old female admitted s/p lumbar decompression.     Action/Plan:   Needs HHPT services.   Anticipated DC Date:  11/17/2012   Anticipated DC Plan:  HOME W HOME HEALTH SERVICES      DC Planning Services  CM consult      Choice offered to / List presented to:          [facility] arranged  HH-2 PT      [facility] agency  [facility]   Status of service:  Completed, signed off Medicare Important Message given?  NA - LOS <3 / Initial given by admissions (If response is "NO", the following Medicare IM given date fields will be blank) Date Medicare IM given:   Date Additional Medicare IM given:    Discharge Disposition:  HOME W HOME HEALTH SERVICES  Per UR Regulation:  Reviewed for med. necessity/level of care/duration of stay  If discussed at Long Length of Stay Meetings, dates discussed:    Comments:

## 2012-11-17 NOTE — Progress Notes (Signed)
Physical Therapy Treatment Patient Details Name: [name] MRN: [id number] DOB: 1945-09-14 Today's Date: 11/17/2012 Time: 4098-1191 PT Time Calculation (min): 18 min  PT Assessment / Plan / Recommendation Comments on Treatment Session  Good progress today, pt independent with sidelying to sit, increased gait distance to 200' with RW. OK to DC home from PT standpoint.     Follow Up Recommendations  Home health PT     Does the patient have the potential to tolerate intense rehabilitation     Barriers to Discharge        Equipment Recommendations  None recommended by PT    Recommendations for Other Services OT consult  Frequency Min 6X/week   Plan Discharge plan remains appropriate    Precautions / Restrictions Precautions Precautions: Back Precaution Booklet Issued: Yes (comment) Precaution Comments: Pt able to state 2/3 precautions.  Requires min verbal cues to reinforce during activities Restrictions Weight Bearing Restrictions: No Other Position/Activity Restrictions: pt able to recall  2 of 3 back precautions   Pertinent Vitals/Pain *pt reported "it doesn't hurt that bad" and refused pain meds**    Mobility  Bed Mobility Bed Mobility: Left Sidelying to Sit Left Sidelying to Sit: HOB flat;With rails;6: Modified independent (Device/Increase time) Transfers Transfers: Sit to Stand;Stand to Sit Sit to Stand: From bed;5: Supervision Stand to Sit: To chair/3-in-1;5: Supervision Details for Transfer Assistance: vc's for hand placement and mod vc's for back precautions Ambulation/Gait Ambulation/Gait Assistance: 6: Modified independent (Device/Increase time) Ambulation Distance (Feet): 200 Feet Assistive device: Rolling walker Gait Pattern: Step-to pattern;Decreased stride length Gait velocity: decreased General Gait Details: good posture and positioning in RW, steady with walking    Exercises General Exercises - Lower Extremity Ankle Circles/Pumps: AROM;Both;10  reps Long Arc Quad: AROM;Both;5 reps;Seated   PT Diagnosis:    PT Problem List:   PT Treatment Interventions:     PT Goals Acute Rehab PT Goals PT Goal Formulation: With patient/family Time For Goal Achievement: 11/28/12 Potential to Achieve Goals: Good Pt will Roll Supine to Left Side: with modified independence Pt will go Supine/Side to Sit: with modified independence PT Goal: Supine/Side to Sit - Progress: Met Pt will go Sit to Stand: with modified independence PT Goal: Sit to Stand - Progress: Progressing toward goal Pt will Ambulate: 51 - 150 feet;with supervision;with rolling walker PT Goal: Ambulate - Progress: Met  Visit Information  Last PT Received On: 11/17/12 Assistance Needed: +1    Subjective Data  Subjective: It feels better when I'm walking.  Patient Stated Goal: go to fle[employer] and [employer], site  Cognition Arousal/Alertness: Awake/alert Behavior During Therapy: WFL for tasks assessed/performed Overall Cognitive Status: Within Functional Limits for tasks assessed    Balance     End of Session PT - End of Session Equipment Utilized During Treatment: Gait belt Activity Tolerance: Patient tolerated treatment well Patient left: with call bell/phone within reach;in chair Nurse Communication: Mobility status   GP Functional Assessment Tool Used: clinical judgement Mobility: Walking and Moving Around Goal Status [phone]): At least 1 percent but less than 20 percent impaired, limited or restricted Mobility: Walking and Moving Around Discharge Status [phone]): At least 1 percent but less than 20 percent impaired, limited or restricted   Tamala Ser 11/17/2012, 9:43 AM [phone]

## 2012-11-17 NOTE — Progress Notes (Signed)
Subjective: 4 Days Post-Op Procedure(s) (LRB): MICRO LUMBAR DECOMPRESSION L4 - L5 AND L2 - L3 2 LEVELS (N/A) Patient reports pain as mild.  Improving incisional back pain. No leg pain. No numbness or tingling. Voiding without difficulty. No BM yet but states she is often constipated. + Flatus  Objective: Vital signs in last 24 hours: Temp:  [98.2 F (36.8 C)-98.8 F (37.1 C)] 98.6 F (37 C) (04/28 0548) Pulse Rate:  [69-80] 78 (04/28 0548) Resp:  [16-20] 16 (04/28 0548) BP: (96-111)/(54-72) 110/72 mmHg (04/28 0548) SpO2:  [93 %-97 %] 94 % (04/28 0548)  Intake/Output from previous day: 04/27 0701 - 04/28 0700 In: 603.3 [P.O.:420; I.V.:183.3] Out: 2600 [Urine:2600] Intake/Output this shift:    No results found for this basename: HGB,  in the last 72 hours No results found for this basename: WBC, RBC, HCT, PLT,  in the last 72 hours No results found for this basename: NA, K, CL, CO2, BUN, CREATININE, GLUCOSE, CALCIUM,  in the last 72 hours No results found for this basename: LABPT, INR,  in the last 72 hours  Neurologically intact ABD soft Neurovascular intact Sensation intact distally Intact pulses distally Dorsiflexion/Plantar flexion intact Incision: dressing C/D/I and no drainage No cellulitis present Compartment soft  Assessment/Plan: 4 Days Post-Op Procedure(s) (LRB): MICRO LUMBAR DECOMPRESSION L4 - L5 AND L2 - L3 2 LEVELS (N/A) Advance diet Up with therapy D/C home today  Discussed D/C instructions Will discuss with Dr. Elissa Lovett, Dayna Barker. 11/17/2012, 7:31 AM

## 2012-11-18 NOTE — Discharge Summary (Signed)
Physician Discharge Summary   Patient ID: [name] MRN: [id number] DOB/AGE: 03-13-1946 67 y.o.  Admit date: 11/13/2012 Discharge date: 11/18/2012  Primary Diagnosis:   Stenosis L2 - L3 and L4 - L5  Admission Diagnoses:  Past Medical History  Diagnosis Date  . Anxiety and depression   . Eczema   . Hypertension   . Goiter   . Dizziness     severe: MRI chronic isch. changes and mastoiditis- saw Dr.Mundy(2007)  STATES SHE STILL HAS EPISODES- ESPECIALLY IF SHE GETS UP TOO QUICKLY AFTER LYING DOWN  . S/P cardiac cath 2009    Revealing nonobstructive CAD w/ tubular, discrete 40% mid lesion in the circumflex; discrete 30% proximal lesion in the LAD; luminal irregularities, 35% mid lesion in RCA; and generic 40% mid lesion in the RCA. Medical treatment recommended.  . Hyperlipidemia   . GERD (gastroesophageal reflux disease)   . Headache   . Arthritis   . Pain     PAIN LOWER BACK AND DOWN RIGHT LEG WITH NUMBNESS, TINGLING RT LEG AND FOOT--STENOSIS  . Coronary artery disease    Discharge Diagnoses:   Principal Problem:   Spinal stenosis of lumbar region  Procedure:  Procedure(s) (LRB): MICRO LUMBAR DECOMPRESSION L4 - L5 AND L2 - L3 2 LEVELS (N/A)   Consults: None  HPI:  see H&P    Laboratory Data: Hospital Outpatient Visit on 10/30/2012  Component Date Value Range Status  . MRSA, PCR 10/30/2012 NEGATIVE  NEGATIVE Final  . Staphylococcus aureus 10/30/2012 POSITIVE* NEGATIVE Final   Comment:                                 The Xpert SA Assay (FDA                          approved for NASAL specimens                          in patients over 28 years of age),                          is one component of                          a comprehensive surveillance                          program.  Test performance has                          been validated by [employer] for patients greater                          than or equal to 1 year  old.                          It is not intended                          to diagnose infection nor  to                          guide or monitor treatment.  . Sodium 10/30/2012 140  135 - 145 mEq/L Final  . Potassium 10/30/2012 3.8  3.5 - 5.1 mEq/L Final  . Chloride 10/30/2012 104  96 - 112 mEq/L Final  . CO2 10/30/2012 26  19 - 32 mEq/L Final  . Glucose, Bld 10/30/2012 131* 70 - 99 mg/dL Final  . BUN 91/47/8295 10  6 - 23 mg/dL Final  . Creatinine, Ser 10/30/2012 0.67  0.50 - 1.10 mg/dL Final  . Calcium 62/13/0865 9.5  8.4 - 10.5 mg/dL Final  . GFR calc non Af Amer 10/30/2012 88* >90 mL/min Final  . GFR calc Af Amer 10/30/2012 >90  >90 mL/min Final   Comment:                                 The eGFR has been calculated                          using the CKD EPI equation.                          This calculation has not been                          validated in all clinical                          situations.                          eGFR's persistently                          <90 mL/min signify                          possible Chronic Kidney Disease.  . WBC 10/30/2012 6.1  4.0 - 10.5 K/uL Final  . RBC 10/30/2012 4.53  3.87 - 5.11 MIL/uL Final  . Hemoglobin 10/30/2012 14.7  12.0 - 15.0 g/dL Final  . HCT 78/46/9629 42.6  36.0 - 46.0 % Final  . MCV 10/30/2012 94.0  78.0 - 100.0 fL Final  . MCH 10/30/2012 32.5  26.0 - 34.0 pg Final  . MCHC 10/30/2012 34.5  30.0 - 36.0 g/dL Final  . RDW 52/84/1324 13.9  11.5 - 15.5 % Final  . Platelets 10/30/2012 162  150 - 400 K/uL Final   No results found for this basename: HGB,  in the last 72 hours No results found for this basename: WBC, RBC, HCT, PLT,  in the last 72 hours No results found for this basename: NA, K, CL, CO2, BUN, CREATININE, GLUCOSE, CALCIUM,  in the last 72 hours No results found for this basename: LABPT, INR,  in the last 72 hours  X-Rays:Dg Chest 2 View  10/30/2012  *RADIOLOGY REPORT*  Clinical Data: Preoperative  evaluation prior lumbar decompression.  CHEST - 2 VIEW  Comparison: Chest x-ray 11/25/2007.  Findings: Lung volumes are normal.  No consolidative airspace disease.  No pleural effusions.  No pneumothorax.  No pulmonary nodule or mass noted.  Pulmonary vasculature and the cardiomediastinal silhouette are within normal limits. Atherosclerosis in the thoracic aorta.  Surgical clips project over the right upper quadrant of the abdomen, likely from prior cholecystectomy.  IMPRESSION: 1. No radiographic evidence of acute cardiopulmonary disease. 2.  Atherosclerosis.   Original Report Authenticated By: Trudie Reed, M.D.    Dg Lumbar Spine 2-3 Views  10/30/2012  *RADIOLOGY REPORT*  Clinical Data: Preoperative evaluation prior to spinal decompression.  LUMBAR SPINE - 2-3 VIEW  Comparison: CT of the lumbar spine 07/25/2012.  Findings: No acute displaced fracture or compression type fracture of the lumbar spine.  Multilevel degenerative disc disease, most severe at L3-L4 and L5-S1.  Multilevel facet arthropathy is also noted, most severe at L5-S1.  Multiple vascular calcifications are incidentally noted. Surgical clips project over the right upper quadrant of the abdomen, likely from prior cholecystectomy.  IMPRESSION: 1.  No acute abnormality of the lumbar spine. 2.  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 3.  Atherosclerosis.   Original Report Authenticated By: Trudie Reed, M.D.    Dg Spine Portable 1 View  11/13/2012  *RADIOLOGY REPORT*  Clinical Data: Lumbar spine surgery.  PORTABLE SPINE - 1 VIEW  Comparison: 11/13/2012.  Findings: Cross-table lateral view of the lumbar spine demonstrates soft tissue retractors in place posterior to L5, with two surgical probes in place.  One of these is localized immediately posterior to the inferior endplate of L5, while the other is localized immediately posterior to the lower third of the L4 vertebral body.  IMPRESSION: 1.  Intraoperative  localization, as above.   Original Report Authenticated By: Trudie Reed, M.D.    Dg Spine Portable 1 View  11/13/2012  *RADIOLOGY REPORT*  Clinical Data: Intraoperative localization.  PORTABLE SPINE - 1 VIEW  Comparison: 11/13/2012  Findings: The surgical retractor and surgical instrument marking the L5 vertebral body level.  IMPRESSION: L5 marked intraoperatively.   Original Report Authenticated By: Rudie Meyer, M.D.    Dg Spine Portable 1 View  11/13/2012  *RADIOLOGY REPORT*  Clinical Data: Lumbar decompression  PORTABLE SPINE - 1 VIEW  Comparison: 11/13/2012.  CT myelogram 07/25/2012  Findings: Mild anterior slip L3-4 with disc degeneration and spurring at L3-4.  Surgical instrument overlying the canal the level of the L2 pedicle.  Surgical instrument lying the spinal canal level of the L3 pedicle.  IMPRESSION: Operative localization as above.   Original Report Authenticated By: Janeece Riggers, M.D.    Dg Spine Portable 1 View  11/13/2012  *RADIOLOGY REPORT*  Clinical Data: Lumbar spine surgery.  PORTABLE SPINE - 1 VIEW  Comparison: One view lumbar spine from the same day.  Findings: A surgical probe is directed at the posterior elements of L3.  IMPRESSION: Intraoperative localization of the L3 vertebral body.   Original Report Authenticated By: Marin Roberts, M.D.    Dg Spine Portable 1 View  11/13/2012  *RADIOLOGY REPORT*  Clinical Data: Lumbar spine surgery.  PORTABLE SPINE - 1 VIEW  Comparison: Two-view lumbar spine 10/30/2012.  Findings: A needle was directed at the L5-S1 lumbar disc level.  A more superior needle is directed at the posterior elements of L4. The vertebral bodies are numbered.  Atherosclerotic changes are again noted.  IMPRESSION:  1.  Intraoperative localization of L4 posterior elements and L5-S1 disc.   Original Report Authenticated By: Marin Roberts, M.D.     EKG: Orders placed in visit on 08/01/12  . EKG 12-LEAD     Hospital Course: Patient was admitted  to [facility] and taken to the OR and underwent the above state procedure without complications.  Patient tolerated the procedure well and was later transferred to the recovery room and then to the orthopaedic floor for postoperative care.  They were given PO and IV analgesics for pain control following their surgery.  They were given 24 hours of postoperative antibiotics.   PT was consulted postop to assist with mobility and transfers.  The patient was allowed to be WBAT with therapy and was taught back precautions. Discharge planning was consulted to help with postop disposition and equipment needs.  Patient had a difficult night on the evening of surgery and started to get up OOB with therapy on day one. Patient was slow to progress with PT but was ready for D/C by day 4. They were given discharge instructions and dressing directions.  They were instructed on when to follow up in the office with Dr. Shelle Iron.  Discharge Medications: Prior to Admission medications   Medication Sig Start Date End Date Taking? Authorizing Provider  ALPRAZolam (XANAX) 0.25 MG tablet Take 0.25-0.5 mg by mouth 3 (three) times daily as needed for sleep.   Yes Historical Provider, MD  amLODipine (NORVASC) 10 MG tablet Take 10 mg by mouth every morning.   Yes Historical Provider, MD  aspirin 81 MG tablet Take 81 mg by mouth daily.    Yes Historical Provider, MD  Calcium Carbonate (CALTRATE 600 PO) Take 600 mg by mouth daily.   Yes Historical Provider, MD  celecoxib (CELEBREX) 200 MG capsule Take 200 mg by mouth daily as needed for pain.    Yes Historical Provider, MD  citalopram (CELEXA) 20 MG tablet Take 20 mg by mouth every morning.   Yes Historical Provider, MD  ergocalciferol (VITAMIN D2) 50000 UNITS capsule Take 1 capsule (50,000 Units total) by mouth once a week. 08/07/12  Yes Wanda Plump, MD  ibuprofen (ADVIL,MOTRIN) 200 MG tablet Take 200 mg by mouth every 6 (six) hours as needed for pain.   Yes Historical  Provider, MD  omeprazole (PRILOSEC) 20 MG capsule take 1 capsule by mouth once daily 08/24/12  Yes Wanda Plump, MD  simvastatin (ZOCOR) 20 MG tablet Take 20 mg by mouth at bedtime.   Yes Historical Provider, MD  traMADol (ULTRAM) 50 MG tablet Take 50 mg by mouth 3 (three) times daily as needed.    Yes Historical Provider, MD  HYDROcodone-acetaminophen (NORCO) 5-325 MG per tablet Take 1-2 tablets by mouth every 4 (four) hours as needed for pain. 11/13/12   Dayna Barker. Sasuke Yaffe, PA-C  methocarbamol (ROBAXIN) 500 MG tablet Take 1 tablet (500 mg total) by mouth 4 (four) times daily. 11/13/12   Dayna Barker. Christene Lye, PA-C    Diet: low sodium heart healthy Activity:WBAT Follow-up:in 10-14 days Disposition - Home Discharged Condition: good   Discharge Orders   Future Orders Complete By Expires     Call MD / Call 911  As directed     Comments:      If you experience chest pain or shortness of breath, CALL 911 and be transported to the hospital emergency room.  If you develope a fever above 101 F, pus (white drainage) or increased drainage or redness at the wound, or calf pain, call your surgeon's office.    Constipation Prevention  As directed     Comments:      Drink plenty of fluids.  Prune juice may be helpful.  You may use a stool softener,  such as Colace (over the counter) 100 mg twice a day.  Use MiraLax (over the counter) for constipation as needed.    Diet - low sodium heart healthy  As directed     Increase activity slowly as tolerated  As directed         Medication List    TAKE these medications       ALPRAZolam 0.25 MG tablet  Commonly known as:  XANAX  Take 0.25-0.5 mg by mouth 3 (three) times daily as needed for sleep.     amLODipine 10 MG tablet  Commonly known as:  NORVASC  Take 10 mg by mouth every morning.     aspirin 81 MG tablet  Take 81 mg by mouth daily.     CALTRATE 600 PO  Take 600 mg by mouth daily.     celecoxib 200 MG capsule  Commonly known as:  CELEBREX  Take  200 mg by mouth daily as needed for pain.     citalopram 20 MG tablet  Commonly known as:  CELEXA  Take 20 mg by mouth every morning.     ergocalciferol 50000 UNITS capsule  Commonly known as:  VITAMIN D2  Take 1 capsule (50,000 Units total) by mouth once a week.     HYDROcodone-acetaminophen 5-325 MG per tablet  Commonly known as:  NORCO  Take 1-2 tablets by mouth every 4 (four) hours as needed for pain.     ibuprofen 200 MG tablet  Commonly known as:  ADVIL,MOTRIN  Take 200 mg by mouth every 6 (six) hours as needed for pain.     methocarbamol 500 MG tablet  Commonly known as:  ROBAXIN  Take 1 tablet (500 mg total) by mouth 4 (four) times daily.     omeprazole 20 MG capsule  Commonly known as:  PRILOSEC  take 1 capsule by mouth once daily     simvastatin 20 MG tablet  Commonly known as:  ZOCOR  Take 20 mg by mouth at bedtime.     traMADol 50 MG tablet  Commonly known as:  ULTRAM  Take 50 mg by mouth 3 (three) times daily as needed.           Follow-up Information   Follow up with BEANE,JEFFREY C, MD In 2 weeks.   Contact information:   [address] [place] [address] [place] [address] [phone]       Signed: Dorothy Spark. 11/18/2012, 10:09 AM

## 2012-11-21 ENCOUNTER — Emergency Department (HOSPITAL_COMMUNITY)
Admission: EM | Admit: 2012-11-21 | Discharge: 2012-11-21 | Disposition: A | Discharge status: Home / Self Care | Payer: Medicare Other | Attending: Emergency Medicine | Admitting: Emergency Medicine

## 2012-11-21 ENCOUNTER — Encounter (HOSPITAL_COMMUNITY): Payer: Self-pay | Admitting: *Deleted

## 2012-11-21 DIAGNOSIS — E785 Hyperlipidemia, unspecified: Secondary | ICD-10-CM | POA: Insufficient documentation

## 2012-11-21 DIAGNOSIS — R220 Localized swelling, mass and lump, head: Secondary | ICD-10-CM | POA: Insufficient documentation

## 2012-11-21 DIAGNOSIS — Z87891 Personal history of nicotine dependence: Secondary | ICD-10-CM | POA: Insufficient documentation

## 2012-11-21 DIAGNOSIS — I2510 Atherosclerotic heart disease of native coronary artery without angina pectoris: Secondary | ICD-10-CM | POA: Insufficient documentation

## 2012-11-21 DIAGNOSIS — F341 Dysthymic disorder: Secondary | ICD-10-CM | POA: Insufficient documentation

## 2012-11-21 DIAGNOSIS — Z862 Personal history of diseases of the blood and blood-forming organs and certain disorders involving the immune mechanism: Secondary | ICD-10-CM | POA: Insufficient documentation

## 2012-11-21 DIAGNOSIS — R221 Localized swelling, mass and lump, neck: Secondary | ICD-10-CM | POA: Insufficient documentation

## 2012-11-21 DIAGNOSIS — Z79899 Other long term (current) drug therapy: Secondary | ICD-10-CM | POA: Insufficient documentation

## 2012-11-21 DIAGNOSIS — I10 Essential (primary) hypertension: Secondary | ICD-10-CM | POA: Insufficient documentation

## 2012-11-21 DIAGNOSIS — Z7982 Long term (current) use of aspirin: Secondary | ICD-10-CM | POA: Insufficient documentation

## 2012-11-21 DIAGNOSIS — R21 Rash and other nonspecific skin eruption: Secondary | ICD-10-CM

## 2012-11-21 DIAGNOSIS — Z8639 Personal history of other endocrine, nutritional and metabolic disease: Secondary | ICD-10-CM | POA: Insufficient documentation

## 2012-11-21 DIAGNOSIS — M129 Arthropathy, unspecified: Secondary | ICD-10-CM | POA: Insufficient documentation

## 2012-11-21 DIAGNOSIS — K219 Gastro-esophageal reflux disease without esophagitis: Secondary | ICD-10-CM | POA: Insufficient documentation

## 2012-11-21 DIAGNOSIS — Z872 Personal history of diseases of the skin and subcutaneous tissue: Secondary | ICD-10-CM | POA: Insufficient documentation

## 2012-11-21 DIAGNOSIS — T783XXA Angioneurotic edema, initial encounter: Secondary | ICD-10-CM | POA: Insufficient documentation

## 2012-11-21 DIAGNOSIS — M7989 Other specified soft tissue disorders: Secondary | ICD-10-CM | POA: Insufficient documentation

## 2012-11-21 LAB — CBC WITH DIFFERENTIAL/PLATELET
Basophils Relative: 0 % (ref 0–1)
Eosinophils Absolute: 0.1 10*3/uL (ref 0.0–0.7)
Hemoglobin: 13.1 g/dL (ref 12.0–15.0)
MCH: 31.6 pg (ref 26.0–34.0)
MCHC: 33.9 g/dL (ref 30.0–36.0)
Monocytes Relative: 7 % (ref 3–12)
Neutrophils Relative %: 58 % (ref 43–77)
RDW: 14.1 % (ref 11.5–15.5)

## 2012-11-21 LAB — BASIC METABOLIC PANEL
BUN: 11 mg/dL (ref 6–23)
Calcium: 9.4 mg/dL (ref 8.4–10.5)
Creatinine, Ser: 0.63 mg/dL (ref 0.50–1.10)
GFR calc non Af Amer: >90 mL/min (ref >90)
Glucose, Bld: 131 mg/dL — ABNORMAL HIGH (ref 70–99)
Potassium: 3.2 mEq/L — ABNORMAL LOW (ref 3.5–5.1)

## 2012-11-21 MED ORDER — DIPHENHYDRAMINE HCL 25 MG PO TABS: 25.0000 mg | ORAL_TABLET | Freq: Three times a day (TID) | ORAL | Status: DC

## 2012-11-21 MED ORDER — PREDNISONE 20 MG PO TABS: 60.0000 mg | ORAL_TABLET | Freq: Every day | ORAL | Status: AC

## 2012-11-21 MED ORDER — SODIUM CHLORIDE 0.9 % IV BOLUS (SEPSIS)
1000.0000 mL | Freq: Once | INTRAVENOUS | Status: AC
  Administered 2012-11-21: 1000 mL via INTRAVENOUS

## 2012-11-21 MED ORDER — PREDNISONE 50 MG PO TABS
50.0000 mg | ORAL_TABLET | Freq: Every day | ORAL | Status: DC
  Filled 2012-11-21: qty 1

## 2012-11-21 MED ORDER — PREDNISONE 50 MG PO TABS
50.0000 mg | ORAL_TABLET | Freq: Once | ORAL | Status: AC
  Administered 2012-11-21: 50 mg via ORAL

## 2012-11-21 MED ORDER — FAMOTIDINE IN NACL 20-0.9 MG/50ML-% IV SOLN
20.0000 mg | Freq: Once | INTRAVENOUS | Status: AC
  Administered 2012-11-21: 20 mg via INTRAVENOUS
  Filled 2012-11-21: qty 50

## 2012-11-21 MED ORDER — FAMOTIDINE 20 MG PO TABS: 20.0000 mg | ORAL_TABLET | Freq: Two times a day (BID) | ORAL | Status: DC

## 2012-11-21 NOTE — ED Notes (Signed)
Pt states tonight at 1800 developed hand itching and tingling; hands continued to swell; mouth started swelling--presents with lips swollen; inside of butt cheeks has hives; itching all over; states starting to have difficulty breathing; speaking in full sentences

## 2012-11-21 NOTE — ED Provider Notes (Signed)
History     CSN: [id number]  Arrival date & time 11/21/12  0000   First MD Initiated Contact with Patient 11/21/12 0007      Chief Complaint  Patient presents with  . Allergic Reaction    (Consider location/radiation/quality/duration/timing/severity/associated sxs/prior treatment) HPI  Patient presents with swelling of her face, pruritic rash. Symptoms began with bilateral hand swelling, approximately 5 hours prior to arrival. She subsequently developed urticaria-like rash in her buttocks, bilateral arms. Soon thereafter she developed swelling of her lips. There is no sense of throat fullness, nor closure. There is no chest pain.  There is mild chest tightness. No fever, no chills, no vomiting, no lightheadedness, no near-syncope. No clear precipitant. New medication. Since onset the patient has taken Benadryl, approximately 25 mg. No clear exacerbating or alleviating factors.  The  Past Medical History  Diagnosis Date  . Anxiety and depression   . Eczema   . Hypertension   . Goiter   . Dizziness     severe: MRI chronic isch. changes and mastoiditis- saw Dr.Mundy(2007)  STATES SHE STILL HAS EPISODES- ESPECIALLY IF SHE GETS UP TOO QUICKLY AFTER LYING DOWN  . S/P cardiac cath 2009    Revealing nonobstructive CAD w/ tubular, discrete 40% mid lesion in the circumflex; discrete 30% proximal lesion in the LAD; luminal irregularities, 35% mid lesion in RCA; and generic 40% mid lesion in the RCA. Medical treatment recommended.  . Hyperlipidemia   . GERD (gastroesophageal reflux disease)   . Headache   . Arthritis   . Pain     PAIN LOWER BACK AND DOWN RIGHT LEG WITH NUMBNESS, TINGLING RT LEG AND FOOT--STENOSIS  . Coronary artery disease     Past Surgical History  Procedure Laterality Date  . Cholecystectomy    . Abdominal hysterectomy      oophorectomy L only  . Shoulder surgery  2006    left   . Hand surgery  1990s    R hand x 2, L hand x 1  . Bunionectomy  1990s   LEFT  . Lumbar laminectomy/decompression microdiscectomy N/A 11/13/2012    Procedure: MICRO LUMBAR DECOMPRESSION L4 - L5 AND L2 - L3 2 LEVELS;  Surgeon: Javier Docker, MD;  Location: [facility];  Service: Orthopedics;  Laterality: N/A;    Family History  Problem Relation Age of Onset  . Colon cancer Neg Hx   . Breast cancer      2 cousins  . Throat cancer      cousin  . Heart attack      several family memers  . Lung cancer Mother     cousin    History  Substance Use Topics  . Smoking status: Former Games developer  . Smokeless tobacco: Never Used     Comment: quit aprox 2000 after 30 years, 1 to 1.5 ppd  . Alcohol Use: No    OB History   Grav Para Term Preterm Abortions TAB SAB Ect Mult Living                  Review of Systems  All other systems reviewed and are negative.    Allergies  Sertraline hcl and Codeine  Home Medications   Current Outpatient Rx  Name  Route  Sig  Dispense  Refill  . ALPRAZolam (XANAX) 0.25 MG tablet   Oral   Take 0.25-0.5 mg by mouth 3 (three) times daily as needed for sleep.         Marland Kitchen amLODipine (  NORVASC) 10 MG tablet   Oral   Take 10 mg by mouth every morning.         Marland Kitchen aspirin 81 MG tablet   Oral   Take 81 mg by mouth daily.          . Calcium Carbonate (CALTRATE 600 PO)   Oral   Take 600 mg by mouth daily.         . celecoxib (CELEBREX) 200 MG capsule   Oral   Take 200 mg by mouth daily as needed for pain.          . citalopram (CELEXA) 20 MG tablet   Oral   Take 20 mg by mouth every morning.         . ergocalciferol (VITAMIN D2) 50000 UNITS capsule   Oral   Take 1 capsule (50,000 Units total) by mouth once a week.   12 capsule   0   . HYDROcodone-acetaminophen (NORCO) 5-325 MG per tablet   Oral   Take 1-2 tablets by mouth every 4 (four) hours as needed for pain.   40 tablet   0   . ibuprofen (ADVIL,MOTRIN) 200 MG tablet   Oral   Take 200 mg by mouth every 6 (six) hours as needed for pain.         .  methocarbamol (ROBAXIN) 500 MG tablet   Oral   Take 1 tablet (500 mg total) by mouth 4 (four) times daily.   40 tablet   0   . omeprazole (PRILOSEC) 20 MG capsule      take 1 capsule by mouth once daily   90 capsule   3   . simvastatin (ZOCOR) 20 MG tablet   Oral   Take 20 mg by mouth at bedtime.         . traMADol (ULTRAM) 50 MG tablet   Oral   Take 50 mg by mouth 3 (three) times daily as needed.            BP 113/79  Pulse 107  Temp(Src) 98.5 F (36.9 C)  Resp 20  SpO2 97%  Physical Exam  Nursing note and vitals reviewed. Constitutional: She is oriented to person, place, and time. She appears well-developed and well-nourished. No distress.  HENT:  Head: Normocephalic.  Mouth/Throat: Uvula is midline, oropharynx is clear and moist and mucous membranes are normal.    Eyes: Conjunctivae and EOM are normal.  Neck: Normal range of motion. No muscular tenderness present. No rigidity. No edema present. No mass present.  Cardiovascular: Normal rate and regular rhythm.   Pulmonary/Chest: Effort normal and breath sounds normal. No stridor. No respiratory distress.  Abdominal: She exhibits no distension.  Musculoskeletal: She exhibits no edema.  Neurological: She is alert and oriented to person, place, and time. She displays no atrophy and no tremor. No cranial nerve deficit or sensory deficit. She exhibits normal muscle tone. She displays no seizure activity.  Skin: Skin is warm and dry.  There are multiple areas of urticarial rash.  There is mild surrounding erythema, but no indurated skin about these areas,  Psychiatric: She has a normal mood and affect.    ED Course  Procedures (including critical care time)  Labs Reviewed  BASIC METABOLIC PANEL  CBC WITH DIFFERENTIAL   No results found.   No diagnosis found.  Pulse ox 99% room air normal.  1:18 AM Patient in no distress. Initial labs unremarkable  2:24 AM Rash improved measurably, as has the  lip  edema.  No new complaints.  MDM  This patient presents with a likely allergic reaction.  [name] airway is intact, she is awake, alert, appropriately interactive.  Her symptoms improved substantially while in the emergency department.  Postulating that her amlodipine is contributory, she was advised to stop this medication, and follow up with her primary care physician tomorrow morning to ensure appropriate resolution of her symptoms.        Gerhard Munch, MD 11/21/12 Emeline Darling

## 2012-11-21 NOTE — ED Notes (Signed)
Pt's facial edema and rash on her arm has begun to subside.  Arm redness has decreased.  Pt airway intact.

## 2012-11-24 ENCOUNTER — Telehealth: Payer: Self-pay | Admitting: Internal Medicine

## 2012-11-24 NOTE — Telephone Encounter (Signed)
Pt states that she had been seen in the ER one week to the day of surgery. States she had swelling, hives, itching. Pt states that she had been on Norvasc for several years so the ER physicians do not think that this is the cause. Pt is allergic to codeine so PA thought that she had an allergic reaction to the pain meds has a new Rx for Tramadol. ER told her to get the ok from PCP to begin Norvasc again.

## 2012-11-24 NOTE — Telephone Encounter (Signed)
Discussed with pt

## 2012-11-24 NOTE — Telephone Encounter (Signed)
Addendum, pt was rec to f/u here for a check up, please arrange a check up for this week

## 2012-11-24 NOTE — Telephone Encounter (Signed)
Patient Information:  Caller Name: Dema  Phone: [phone]  Patient: [name], [name]  Gender: Female  DOB: September 20, 1945  Age: 67 Years  PCP: Willow Ora  Office Follow Up:  Does the office need to follow up with this patient?: Yes  Instructions For The Office: OFFICE PLEASE FOLLOW UP WITH PT.  SHE WANTS TO KNOW IF IT IS SAFE FOR HER TO TAKE HER NORVASC.   Symptoms  Reason For Call & Symptoms: pt reports she has surgery on 11/13/12.  Pt states she was seen in the ER for allergic reaction to medicaiton (oxycodone or the high blood pressure medication).  Pt was told to follow up with MD regarding if she should take the Norvac.  Pt has not taken Norvasc since 11/20/12  Reviewed Health History In EMR: No  Reviewed Medications In EMR: No  Reviewed Allergies In EMR: No  Reviewed Surgeries / Procedures: No  Date of Onset of Symptoms: Unknown  Treatments Tried: Prednisone, Benadryl  Treatments Tried Worked: No  Guideline(s) Used:  No Protocol Available - Sick Adult  Disposition Per Guideline:   Discuss with PCP and Callback by Nurse Today  Reason For Disposition Reached:   Nursing judgment  Advice Given:  N/A  Patient Will Follow Care Advice:  YES

## 2012-11-24 NOTE — Telephone Encounter (Signed)
Agree that is unlikely she is allergic to amlodipine, ok to restart amlodipine, if allergies resurface, stop amlodipine and call or go to the ER if allergy severe

## 2012-11-26 NOTE — Telephone Encounter (Signed)
Pt scheduled for this Friday, 11/28/12.

## 2012-11-27 ENCOUNTER — Encounter: Payer: Self-pay | Admitting: Lab

## 2012-11-28 ENCOUNTER — Ambulatory Visit (INDEPENDENT_AMBULATORY_CARE_PROVIDER_SITE_OTHER): Payer: Medicare Other | Admitting: Internal Medicine

## 2012-11-28 VITALS — BP 112/74 | HR 84 | Temp 98.2°F | Wt 176.0 lb

## 2012-11-28 DIAGNOSIS — T7840XD Allergy, unspecified, subsequent encounter: Secondary | ICD-10-CM

## 2012-11-28 DIAGNOSIS — T7840XA Allergy, unspecified, initial encounter: Secondary | ICD-10-CM | POA: Insufficient documentation

## 2012-11-28 DIAGNOSIS — Z5189 Encounter for other specified aftercare: Secondary | ICD-10-CM

## 2012-11-28 DIAGNOSIS — M549 Dorsalgia, unspecified: Secondary | ICD-10-CM

## 2012-11-28 DIAGNOSIS — I10 Essential (primary) hypertension: Secondary | ICD-10-CM

## 2012-11-28 NOTE — Assessment & Plan Note (Signed)
Status post back surgery 11/13/2012, doing well.

## 2012-11-28 NOTE — Assessment & Plan Note (Signed)
Well-controlled, on amlodipine

## 2012-11-28 NOTE — Progress Notes (Signed)
  Subjective:    Patient ID: [name], female    DOB: 09/09/1945, 67 y.o.   MRN: [id number]  HPI  ER followup Patient had back surgery 11/13/2012, was discharged home with hydrocodone for pain and  Robaxin. A week after the surgery she developed generalized whelps on the skin. Went to the ER, pain medication was discontinue, was prescribed Benadryl and prednisone and she feels better.  Past Medical History  Diagnosis Date  . Anxiety and depression   . Eczema   . Hypertension   . Goiter   . Dizziness     severe: MRI chronic isch. changes and mastoiditis- saw Dr.Mundy(2007)  STATES SHE STILL HAS EPISODES- ESPECIALLY IF SHE GETS UP TOO QUICKLY AFTER LYING DOWN  . S/P cardiac cath 2009    Revealing nonobstructive CAD w/ tubular, discrete 40% mid lesion in the circumflex; discrete 30% proximal lesion in the LAD; luminal irregularities, 35% mid lesion in RCA; and generic 40% mid lesion in the RCA. Medical treatment recommended.  . Hyperlipidemia   . GERD (gastroesophageal reflux disease)   . Headache   . Arthritis   . Pain     PAIN LOWER BACK AND DOWN RIGHT LEG WITH NUMBNESS, TINGLING RT LEG AND FOOT--STENOSIS  . Coronary artery disease    Past Surgical History  Procedure Laterality Date  . Cholecystectomy    . Abdominal hysterectomy      oophorectomy L only  . Shoulder surgery  2006    left   . Hand surgery  1990s    R hand x 2, L hand x 1  . Bunionectomy  1990s    LEFT  . Lumbar laminectomy/decompression microdiscectomy N/A 11/13/2012    Procedure: MICRO LUMBAR DECOMPRESSION L4 - L5 AND L2 - L3 2 LEVELS;  Surgeon: Javier Docker, MD;  Location: [facility];  Service: Orthopedics;  Laterality: N/A;       Review of Systems At the time of the allergic reaction she did have some lip swelling, no actual shortness or breath, Mucosal membranes of the mouth were not involved. Currently doing much better, pain well-controlled with Ultram, see below.     Objective:   Physical  Exam  General -- alert, well-developed,No apparent distress  Lungs -- normal respiratory effort, no intercostal retractions, no accessory muscle use, and normal breath sounds.   Heart-- normal rate, regular rhythm, no murmur, and no gallop.   Skin-- No lesions in the uncovered portions of the skin Extremities-- no pretibial edema bilaterally  Neurologic-- alert & oriented X3 and strength normal in all extremities. Psych-- Cognition and judgment appear intact. Alert and cooperative with normal attention span and concentration.  not anxious appearing and not depressed appearing.       Assessment & Plan:

## 2012-11-28 NOTE — Assessment & Plan Note (Signed)
Status post allergic reaction likely to hydrocodone. She was also on Ultram but she has been taking that for years. She Is on Robaxin which she has taken for long time without problems. Patient is aware is allergic to hydrocodone, the medication was added to her allergies list.

## 2012-11-28 NOTE — Patient Instructions (Addendum)
Next visit in 3 months for a physical Motrin -celebrex:  Always take it with food. Watch for stomach side effects (gastritis): nausea, stomach pain, change in the color of stools.

## 2012-11-30 ENCOUNTER — Encounter: Payer: Self-pay | Admitting: Internal Medicine

## 2012-12-16 ENCOUNTER — Other Ambulatory Visit: Payer: Self-pay | Admitting: Internal Medicine

## 2012-12-16 NOTE — Telephone Encounter (Signed)
Refill done.  

## 2013-03-04 ENCOUNTER — Ambulatory Visit: Payer: Medicare Other | Admitting: Internal Medicine

## 2013-03-09 ENCOUNTER — Ambulatory Visit (INDEPENDENT_AMBULATORY_CARE_PROVIDER_SITE_OTHER): Payer: Medicare Other | Admitting: Internal Medicine

## 2013-03-09 ENCOUNTER — Encounter: Payer: Self-pay | Admitting: Internal Medicine

## 2013-03-09 VITALS — BP 130/80 | HR 93 | Temp 98.0°F | Wt 173.0 lb

## 2013-03-09 DIAGNOSIS — I10 Essential (primary) hypertension: Secondary | ICD-10-CM

## 2013-03-09 DIAGNOSIS — E785 Hyperlipidemia, unspecified: Secondary | ICD-10-CM

## 2013-03-09 DIAGNOSIS — R7309 Other abnormal glucose: Secondary | ICD-10-CM

## 2013-03-09 DIAGNOSIS — M549 Dorsalgia, unspecified: Secondary | ICD-10-CM

## 2013-03-09 DIAGNOSIS — R739 Hyperglycemia, unspecified: Secondary | ICD-10-CM

## 2013-03-09 DIAGNOSIS — F341 Dysthymic disorder: Secondary | ICD-10-CM

## 2013-03-09 NOTE — Patient Instructions (Addendum)
Take calcium and vitamin D every day as recommended Take alprazolam as needed for panic attacks, if they get  worse more intense or frequent please let me know --- Get your blood work before you leave  Next visit in  5 months for a physical exam Please make an appointment before you leave the office today (or call few weeks in advance)

## 2013-03-09 NOTE — Assessment & Plan Note (Signed)
Recheck her A1c 

## 2013-03-09 NOTE — Assessment & Plan Note (Signed)
Good medication compliance, no change

## 2013-03-09 NOTE — Progress Notes (Signed)
  Subjective:    Patient ID: [name], female    DOB: 1945-08-07, 67 y.o.   MRN: [id number]  HPI Routine office visit Back pain, currently doing well, very seldom use ultram,Celebrex or Motrin. Simvastatin, good medication compliance, no apparent side effects. Hypertension, good medication compliance, all recent BPs available to review In the computer are within normal.   Past Medical History  Diagnosis Date  . Anxiety and depression   . Eczema   . Hypertension   . Goiter   . Dizziness     severe: MRI chronic isch. changes and mastoiditis- saw Dr.Mundy(2007)  STATES SHE STILL HAS EPISODES- ESPECIALLY IF SHE GETS UP TOO QUICKLY AFTER LYING DOWN  . S/P cardiac cath 2009    Revealing nonobstructive CAD w/ tubular, discrete 40% mid lesion in the circumflex; discrete 30% proximal lesion in the LAD; luminal irregularities, 35% mid lesion in RCA; and generic 40% mid lesion in the RCA. Medical treatment recommended.  . Hyperlipidemia   . GERD (gastroesophageal reflux disease)   . Headache(784.0)   . Arthritis   . Pain     PAIN LOWER BACK AND DOWN RIGHT LEG WITH NUMBNESS, TINGLING RT LEG AND FOOT--STENOSIS  . Coronary artery disease    Past Surgical History  Procedure Laterality Date  . Cholecystectomy    . Abdominal hysterectomy      oophorectomy L only  . Shoulder surgery  2006    left   . Hand surgery  1990s    R hand x 2, L hand x 1  . Bunionectomy  1990s    LEFT  . Lumbar laminectomy/decompression microdiscectomy N/A 11/13/2012    Procedure: MICRO LUMBAR DECOMPRESSION L4 - L5 AND L2 - L3 2 LEVELS;  Surgeon: Javier Docker, MD;  Location: [facility];  Service: Orthopedics;  Laterality: N/A;   Review of Systems On further questioning, she reports that anxiety is in general well controlled but she still has episodes when she gets  nervous, shaky, dizzy.. Symptoms and not associated with chest pain, syncope or palpitation. This is going on for at least 2 years.       Objective:   Physical Exam General -- alert, well-developed, NAD.  Lungs -- normal respiratory effort, no intercostal retractions, no accessory muscle use, and normal breath sounds.  Heart-- normal rate, regular rhythm, no murmur.   Extremities-- no pretibial edema bilaterally  Neurologic-- alert & oriented X3. Speech, gait normal.  Psych-- Cognition and judgment appear intact. Alert and cooperative with normal attention span and concentration. not anxious appearing and not depressed appearing.     Assessment & Plan:

## 2013-03-09 NOTE — Assessment & Plan Note (Addendum)
Occasional panic attack as described above, recommend to have Xanax as needed. Had a (-)  stress test 07-2012 Last TSH 2012 was normal. We'll recheck a TSH

## 2013-03-09 NOTE — Assessment & Plan Note (Signed)
Well-controlled, no change, last potassium low, we'll recheck a BMP

## 2013-03-09 NOTE — Assessment & Plan Note (Addendum)
Using,ultram, Celebrex and Motrin as needed only Needs UDS

## 2013-03-10 ENCOUNTER — Encounter: Payer: Self-pay | Admitting: Internal Medicine

## 2013-03-10 LAB — BASIC METABOLIC PANEL
BUN: 9 mg/dL (ref 6–23)
Calcium: 9.5 mg/dL (ref 8.4–10.5)
GFR: 76.66 mL/min (ref >60.00)
Glucose, Bld: 115 mg/dL — ABNORMAL HIGH (ref 70–99)
Sodium: 137 mEq/L (ref 135–145)

## 2013-03-11 ENCOUNTER — Encounter: Payer: Self-pay | Admitting: *Deleted

## 2013-04-08 ENCOUNTER — Encounter: Payer: Self-pay | Admitting: Internal Medicine

## 2013-06-15 ENCOUNTER — Other Ambulatory Visit: Payer: Self-pay | Admitting: Internal Medicine

## 2013-06-15 NOTE — Telephone Encounter (Signed)
rx refilled per protocol. DJR  

## 2013-08-14 ENCOUNTER — Other Ambulatory Visit: Payer: Self-pay | Admitting: Internal Medicine

## 2013-08-17 ENCOUNTER — Other Ambulatory Visit: Payer: Self-pay | Admitting: Internal Medicine

## 2013-08-17 NOTE — Telephone Encounter (Signed)
Amlodipine refilled per protocol. JG//CMA 

## 2013-08-23 LAB — HM MAMMOGRAPHY: HM Mammogram: NORMAL

## 2013-09-29 ENCOUNTER — Encounter: Payer: Self-pay | Admitting: Internal Medicine

## 2013-09-29 ENCOUNTER — Ambulatory Visit (INDEPENDENT_AMBULATORY_CARE_PROVIDER_SITE_OTHER): Payer: Medicare Other | Admitting: Internal Medicine

## 2013-09-29 VITALS — BP 132/63 | HR 84 | Temp 97.9°F | Wt 164.0 lb

## 2013-09-29 DIAGNOSIS — L989 Disorder of the skin and subcutaneous tissue, unspecified: Secondary | ICD-10-CM

## 2013-09-29 NOTE — Patient Instructions (Addendum)
Please schedule a physical at your convenience  

## 2013-09-29 NOTE — Progress Notes (Signed)
Subjective:    Patient ID: [name], female    DOB: [birth year], 68 y.o.   MRN: [id number]  DOS:  09/29/2013 Type of  visit: Acute visit   On and off swelling at the left middle finger, initially she saw some clear discharge, for the last 4 weeks area has been growing steadily.  ROS No purulent discharge, no fever or chills  Past Medical History  Diagnosis Date  . Anxiety and depression   . Eczema   . Hypertension   . Goiter   . Dizziness     severe: MRI chronic isch. changes and mastoiditis- saw Dr.Mundy(2007)  STATES SHE STILL HAS EPISODES- ESPECIALLY IF SHE GETS UP TOO QUICKLY AFTER LYING DOWN  . S/P cardiac cath 2009    Revealing nonobstructive CAD w/ tubular, discrete 40% mid lesion in the circumflex; discrete 30% proximal lesion in the LAD; luminal irregularities, 35% mid lesion in RCA; and generic 40% mid lesion in the RCA. Medical treatment recommended.  . Hyperlipidemia   . GERD (gastroesophageal reflux disease)   . Headache(784.0)   . Arthritis   . Pain     PAIN LOWER BACK AND DOWN RIGHT LEG WITH NUMBNESS, TINGLING RT LEG AND FOOT--STENOSIS  . Coronary artery disease     Past Surgical History  Procedure Laterality Date  . Cholecystectomy    . Abdominal hysterectomy      oophorectomy L only  . Shoulder surgery  2006    left   . Hand surgery  1990s    R hand x 2, L hand x 1  . Bunionectomy  1990s    LEFT  . Lumbar laminectomy/decompression microdiscectomy N/A 11/13/2012    Procedure: MICRO LUMBAR DECOMPRESSION L4 - L5 AND L2 - L3 2 LEVELS;  Surgeon: Johnn Hai, MD;  Location: [facility];  Service: Orthopedics;  Laterality: N/A;    History   Social History  . Marital Status: Married    Spouse Name: N/A    Number of Children: 4  . Years of Education: N/A   Occupational History  . retired     Social History Main Topics  . Smoking status: Former Research scientist (life sciences)  . Smokeless tobacco: Never Used     Comment: quit aprox 2000 after 30 years, 1 to 1.5 ppd    . Alcohol Use: No  . Drug Use: No  . Sexual Activity: Not on file   Other Topics Concern  . Not on file   Social History Narrative   Lives w/ husband         Medication List       This list is accurate as of: 09/29/13  7:38 PM.  Always use your most recent med list.               ALPRAZolam 0.25 MG tablet  Commonly known as:  XANAX  Take 0.25-0.5 mg by mouth at bedtime as needed for sleep.     amLODipine 10 MG tablet  Commonly known as:  NORVASC  take 1 tablet by mouth once daily     aspirin 81 MG tablet  Take 81 mg by mouth every morning.     celecoxib 200 MG capsule  Commonly known as:  CELEBREX  Take 200 mg by mouth daily as needed for pain.     citalopram 20 MG tablet  Commonly known as:  CELEXA  take 1 tablet by mouth once daily     ergocalciferol 50000 UNITS capsule  Commonly known as:  VITAMIN D2  Take 50,000 Units by mouth once a week. On Wednesdays     famotidine 20 MG tablet  Commonly known as:  PEPCID  Take 1 tablet (20 mg total) by mouth 2 (two) times daily.     methocarbamol 500 MG tablet  Commonly known as:  ROBAXIN  Take 1 tablet (500 mg total) by mouth 4 (four) times daily.     NON FORMULARY  Calcium 1 gram, vit D 1000 u a day     omeprazole 20 MG capsule  Commonly known as:  PRILOSEC  take 1 capsule by mouth once daily     simvastatin 20 MG tablet  Commonly known as:  ZOCOR  Take 1 tablet (20 mg total) by mouth at bedtime. DUE . Please schedule physical exam - [phone].     traMADol 50 MG tablet  Commonly known as:  ULTRAM  Take 50 mg by mouth 3 (three) times daily as needed for pain.           Objective:   Physical Exam  Musculoskeletal:       Arms:  BP 132/63  Pulse 84  Temp(Src) 97.9 F (36.6 C)  Wt 164 lb (74.39 kg)  SpO2 93%  General -- alert, well-developed, NAD.     Neurologic--  alert & oriented X3. Speech normal, gait normal, strength normal in all extremities.  Psych-- Cognition and judgment appear  intact. Cooperative with normal attention span and concentration. No anxious or depressed appearing.       Assessment & Plan:    Skin lesion, finger Hard cyst in the finger, refer to [place], she has been seen there before.  Also, noted that she takes both Celebrex and Motrin as needed for pain, recommend to take Tylenol instead of Motrin.  Due for a physical, encouraged to schedule  a visit at her earliest convenience

## 2013-10-19 ENCOUNTER — Other Ambulatory Visit: Payer: Self-pay | Admitting: Internal Medicine

## 2013-11-25 ENCOUNTER — Telehealth: Payer: Self-pay | Admitting: *Deleted

## 2013-11-25 ENCOUNTER — Telehealth: Payer: Self-pay | Admitting: Internal Medicine

## 2013-11-25 NOTE — Telephone Encounter (Signed)
Xanax 0.25mg   Last OV- 09/29/13  Last refilled- 09/29/12 #60 / 1 rf  UDS- 02/27/13 LOW risk

## 2013-11-26 MED ORDER — ALPRAZOLAM 0.25 MG PO TABS: 0.2500 mg | ORAL_TABLET | Freq: Every evening | ORAL | Status: DC | PRN

## 2013-11-26 NOTE — Telephone Encounter (Signed)
Rx for Xanax faxed to [facility] on [address]. Patient made aware.

## 2013-11-26 NOTE — Telephone Encounter (Signed)
done

## 2013-12-02 ENCOUNTER — Telehealth: Payer: Self-pay

## 2013-12-02 NOTE — Telephone Encounter (Signed)
Medication and allergies:  Reviewed and updated  90 day supply/mail order: na Local pharmacy: [facility]   Immunizations due:  UTD  A/P:   No changes to FH, PSH or Personal Hx Flu vaccine--2014 Tdap--2012 PNA--2010 Shingles--2013 MMG--02/2013--neg Bone Density-- CCS--2011--Dr Stark--normal -- next due 2016  To Discuss with Provider: Not at this time

## 2013-12-03 ENCOUNTER — Ambulatory Visit (INDEPENDENT_AMBULATORY_CARE_PROVIDER_SITE_OTHER): Payer: Medicare Other | Admitting: Internal Medicine

## 2013-12-03 ENCOUNTER — Encounter: Payer: Self-pay | Admitting: Internal Medicine

## 2013-12-03 VITALS — BP 113/64 | HR 84 | Temp 98.6°F | Ht 61.0 in | Wt 164.0 lb

## 2013-12-03 DIAGNOSIS — M549 Dorsalgia, unspecified: Secondary | ICD-10-CM

## 2013-12-03 DIAGNOSIS — E042 Nontoxic multinodular goiter: Secondary | ICD-10-CM

## 2013-12-03 DIAGNOSIS — E559 Vitamin D deficiency, unspecified: Secondary | ICD-10-CM

## 2013-12-03 DIAGNOSIS — Z23 Encounter for immunization: Secondary | ICD-10-CM

## 2013-12-03 DIAGNOSIS — Z0000 Encounter for general adult medical examination without abnormal findings: Secondary | ICD-10-CM

## 2013-12-03 DIAGNOSIS — F341 Dysthymic disorder: Secondary | ICD-10-CM

## 2013-12-03 DIAGNOSIS — I10 Essential (primary) hypertension: Secondary | ICD-10-CM

## 2013-12-03 DIAGNOSIS — E785 Hyperlipidemia, unspecified: Secondary | ICD-10-CM

## 2013-12-03 LAB — CBC WITH DIFFERENTIAL/PLATELET
BASOS ABS: 0 10*3/uL (ref 0.0–0.1)
Basophils Relative: 0.3 % (ref 0.0–3.0)
EOS PCT: 1.2 % (ref 0.0–5.0)
Eosinophils Absolute: 0.1 10*3/uL (ref 0.0–0.7)
HCT: 43.9 % (ref 36.0–46.0)
Hemoglobin: 14.5 g/dL (ref 12.0–15.0)
LYMPHS ABS: 2.6 10*3/uL (ref 0.7–4.0)
LYMPHS PCT: 36.7 % (ref 12.0–46.0)
MCHC: 33.1 g/dL (ref 30.0–36.0)
MCV: 94.4 fl (ref 78.0–100.0)
MONOS PCT: 7 % (ref 3.0–12.0)
Monocytes Absolute: 0.5 10*3/uL (ref 0.1–1.0)
NEUTROS PCT: 54.8 % (ref 43.0–77.0)
Neutro Abs: 3.9 10*3/uL (ref 1.4–7.7)
PLATELETS: 211 10*3/uL (ref 150.0–400.0)
RBC: 4.65 Mil/uL (ref 3.87–5.11)
RDW: 13.9 % (ref 11.5–15.5)
WBC: 7.1 10*3/uL (ref 4.0–10.5)

## 2013-12-03 LAB — COMPREHENSIVE METABOLIC PANEL
ALBUMIN: 4.2 g/dL (ref 3.5–5.2)
ALT: 12 U/L (ref 0–35)
AST: 20 U/L (ref 0–37)
Alkaline Phosphatase: 78 U/L (ref 39–117)
BILIRUBIN TOTAL: 0.5 mg/dL (ref 0.2–1.2)
BUN: 14 mg/dL (ref 6–23)
CALCIUM: 9.5 mg/dL (ref 8.4–10.5)
CHLORIDE: 106 meq/L (ref 96–112)
CO2: 29 meq/L (ref 19–32)
Creatinine, Ser: 0.8 mg/dL (ref 0.4–1.2)
GFR: 79.99 mL/min (ref >60.00)
GLUCOSE: 98 mg/dL (ref 70–99)
Potassium: 4 mEq/L (ref 3.5–5.1)
SODIUM: 140 meq/L (ref 135–145)
TOTAL PROTEIN: 7.2 g/dL (ref 6.0–8.3)

## 2013-12-03 LAB — TSH: TSH: 1.54 u[IU]/mL (ref 0.35–4.50)

## 2013-12-03 LAB — LIPID PANEL
CHOLESTEROL: 171 mg/dL (ref 0–200)
HDL: 51.1 mg/dL (ref >39.00)
LDL Cholesterol: 95 mg/dL (ref 0–99)
TRIGLYCERIDES: 123 mg/dL (ref 0.0–149.0)
Total CHOL/HDL Ratio: 3
VLDL: 24.6 mg/dL (ref 0.0–40.0)

## 2013-12-03 NOTE — Assessment & Plan Note (Signed)
Labs

## 2013-12-03 NOTE — Assessment & Plan Note (Addendum)
Td 2002 and 2012  pneumonia shot 2010 prevnar today Shingles shot 2013  Sees gyn, had abnormal PAP 2014, has a f/u w/ them 8-15  MMG  02-2013  Neg, per gyn  Last colonoscopy  07-2009, next 2016 DEXA 07-2009 neg  History of vitamin D deficiency, on qd supplements labs Unable to exercise much, due to back pain. Diet discussed

## 2013-12-03 NOTE — Progress Notes (Signed)
Subjective:    Patient ID: [name], female    DOB: February 16, 1946, 69 y.o.   MRN: [id number]  DOS:  12/03/2013 Type of  visit: Medicare physical 1. Risk factors based on Past M, S, F history: reviewed  2. Physical Activities: more active than last year, home chores , occ walk  3. Depression/mood: well controlled w/ meds  4. Hearing: no problems noted or reported  5. ADL's: Totally independent , still drives  6. Fall Risk: no recent falls, see instructions  7. home Safety: does feelsafe at home  8. Height, weight, &visual acuity: see VS, vision corrected w/ glasses, has cataracts , sees eye MD regularly  9. Counseling: provided  10. Labs ordered based on risk factors: if needed  11. Referral Coordination: if needed  12. Care Plan, see assessment and plan  13. Cognitive Assessment: cognition and motor skills wnl   In addition, today we discussed the following: HTN, on amlodipine, reports normal ambulatory BPs Hyperlipidemia, good compliance with medications. Back pain, DJD. On Celebrex, Tylenol as needed, symptoms relatively well controlled Insomnia, on Xanax, that works well for her. History of goiter, patient somehow concerned about it. Likes to check labs   ROS No  CP, SOB No palpitations, ankle edema B at night only Denies  nausea, vomiting diarrhea  Denies  blood in the stools No GERD  Sx on meds (-) cough, sputum production (-) wheezing, chest congestion No dysuria, gross hematuria, difficulty urinating         Past Medical History  Diagnosis Date  . Anxiety and depression   . Eczema   . Hypertension   . Goiter   . Dizziness     severe: MRI chronic isch. changes and mastoiditis- saw Dr.Mundy(2007)  STATES SHE STILL HAS EPISODES- ESPECIALLY IF SHE GETS UP TOO QUICKLY AFTER LYING DOWN  . S/P cardiac cath 2009    Revealing nonobstructive CAD w/ tubular, discrete 40% mid lesion in the circumflex; discrete 30% proximal lesion in the LAD; luminal irregularities,  35% mid lesion in RCA; and generic 40% mid lesion in the RCA. Medical treatment recommended.  . Hyperlipidemia   . GERD (gastroesophageal reflux disease)   . Headache(784.0)   . Arthritis   . Pain     PAIN LOWER BACK AND DOWN RIGHT LEG WITH NUMBNESS, TINGLING RT LEG AND FOOT--STENOSIS  . Coronary artery disease     Past Surgical History  Procedure Laterality Date  . Cholecystectomy    . Abdominal hysterectomy      oophorectomy L only  . Shoulder surgery  2006    left   . Hand surgery  1990s    R hand x 2, L hand x 1  . Bunionectomy  1990s    LEFT  . Lumbar laminectomy/decompression microdiscectomy N/A 11/13/2012    Procedure: MICRO LUMBAR DECOMPRESSION L4 - L5 AND L2 - L3 2 LEVELS;  Surgeon: Johnn Hai, MD;  Location: [facility];  Service: Orthopedics;  Laterality: N/A;    History   Social History  . Marital Status: Married    Spouse Name: N/A    Number of Children: 4  . Years of Education: N/A   Occupational History  . retired     Social History Main Topics  . Smoking status: Former Research scientist (life sciences)  . Smokeless tobacco: Never Used     Comment: quit aprox 2000 after 30 years, 1 to 1.5 ppd  . Alcohol Use: No  . Drug Use: No  . Sexual  Activity: Not on file   Other Topics Concern  . Not on file   Social History Narrative   Lives w/ husband , 7 Gk, 29 GGk     Family History  Problem Relation Age of Onset  . Colon cancer Neg Hx   . Breast cancer Other     2 cousins  . Throat cancer Other     cousin  . Heart attack Brother     3 brother   . Lung cancer Mother     cousin  . Lung cancer Brother   . Diabetes Neg Hx        Medication List       This list is accurate as of: 12/03/13  3:45 PM.  Always use your most recent med list.               ALPRAZolam 0.25 MG tablet  Commonly known as:  XANAX  Take 1-2 tablets (0.25-0.5 mg total) by mouth at bedtime as needed for sleep.     amLODipine 10 MG tablet  Commonly known as:  NORVASC  take 1 tablet by mouth  once daily     aspirin 81 MG tablet  Take 81 mg by mouth every morning.     celecoxib 200 MG capsule  Commonly known as:  CELEBREX  Take 200 mg by mouth daily as needed for pain.     citalopram 20 MG tablet  Commonly known as:  CELEXA  take 1 tablet by mouth once daily     famotidine 20 MG tablet  Commonly known as:  PEPCID  Take 1 tablet (20 mg total) by mouth 2 (two) times daily.     NON FORMULARY  Calcium 1 gram, vit D 1000 u a day     simvastatin 20 MG tablet  Commonly known as:  ZOCOR  Take 1 tablet (20 mg total) by mouth at bedtime. DUE . Please schedule physical exam - [phone].     traMADol 50 MG tablet  Commonly known as:  ULTRAM  Take 50 mg by mouth 3 (three) times daily as needed for pain.           Objective:   Physical Exam BP 113/64  Pulse 84  Temp(Src) 98.6 F (37 C) (Oral)  Ht 5\' 1"  (1.549 m)  Wt 164 lb (74.39 kg)  BMI 31.00 kg/m2  SpO2 96% General -- alert, well-developed, NAD.  Neck --barely palpable thyroid gland, not tender , not nodular  HEENT-- Not pale.  Lungs -- normal respiratory effort, no intercostal retractions, no accessory muscle use, and normal breath sounds.  Heart-- normal rate, regular rhythm, no murmur.  Abdomen-- Not distended, good bowel sounds,soft, non-tender. No bruit  Extremities-- no pretibial edema bilaterally  Neurologic--  alert & oriented X3. Speech normal, gait normal, strength normal in all extremities.  Psych-- Cognition and judgment appear intact. Cooperative with normal attention span and concentration. No anxious or depressed appearing.        Assessment & Plan:

## 2013-12-03 NOTE — Assessment & Plan Note (Addendum)
Last ultrasound 2009, previous TSHs normal. Physical exam benign. Plan: TSH per pt request, otherwise observation

## 2013-12-03 NOTE — Patient Instructions (Signed)
Get your blood work before you leave    Next visit is for routine check up in 6 months    Fall Prevention and [place] cause injuries and can affect all age groups. It is possible to use preventive measures to significantly decrease the likelihood of falls. There are many simple measures which can make your home safer and prevent falls. OUTDOORS  Repair cracks and edges of walkways and driveways.  Remove high doorway thresholds.  Trim shrubbery on the main path into your home.  Have good outside lighting.  Clear walkways of tools, rocks, debris, and clutter.  Check that handrails are not broken and are securely fastened. Both sides of steps should have handrails.  Have leaves, snow, and ice cleared regularly.  Use sand or salt on walkways during winter months.  In the garage, clean up grease or oil spills. BATHROOM  Install night lights.  Install grab bars by the toilet and in the tub and shower.  Use non-skid mats or decals in the tub or shower.  Place a plastic non-slip stool in the shower to sit on, if needed.  Keep floors dry and clean up all water on the floor immediately.  Remove soap buildup in the tub or shower on a regular basis.  Secure bath mats with non-slip, double-sided rug tape.  Remove throw rugs and tripping hazards from the floors. BEDROOMS  Install night lights.  Make sure a bedside light is easy to reach.  Do not use oversized bedding.  Keep a telephone by your bedside.  Have a firm chair with side arms to use for getting dressed.  Remove throw rugs and tripping hazards from the floor. KITCHEN  Keep handles on pots and pans turned toward the center of the stove. Use back burners when possible.  Clean up spills quickly and allow time for drying.  Avoid walking on wet floors.  Avoid hot utensils and knives.  Position shelves so they are not too high or low.  Place commonly used objects within easy reach.  If necessary,  use a sturdy step stool with a grab bar when reaching.  Keep electrical cables out of the way.  Do not use floor polish or wax that makes floors slippery. If you must use wax, use non-skid floor wax.  Remove throw rugs and tripping hazards from the floor. STAIRWAYS  Never leave objects on stairs.  Place handrails on both sides of stairways and use them. Fix any loose handrails. Make sure handrails on both sides of the stairways are as long as the stairs.  Check carpeting to make sure it is firmly attached along stairs. Make repairs to worn or loose carpet promptly.  Avoid placing throw rugs at the top or bottom of stairways, or properly secure the rug with carpet tape to prevent slippage. Get rid of throw rugs, if possible.  Have an electrician put in a light switch at the top and bottom of the stairs. OTHER FALL PREVENTION TIPS  Wear low-heel or rubber-soled shoes that are supportive and fit well. Wear closed toe shoes.  When using a stepladder, make sure it is fully opened and both spreaders are firmly locked. Do not climb a closed stepladder.  Add color or contrast paint or tape to grab bars and handrails in your home. Place contrasting color strips on first and last steps.  Learn and use mobility aids as needed. Install an electrical emergency response system.  Turn on lights to avoid dark areas. Replace light  bulbs that burn out immediately. Get light switches that glow.  Arrange furniture to create clear pathways. Keep furniture in the same place.  Firmly attach carpet with non-skid or double-sided tape.  Eliminate uneven floor surfaces.  Select a carpet pattern that does not visually hide the edge of steps.  Be aware of all pets. OTHER HOME SAFETY TIPS  Set the water temperature for 120 F (48.8 C).  Keep emergency numbers on or near the telephone.  Keep smoke detectors on every level of the home and near sleeping areas. Document Released: 06/29/2002 Document  Revised: 01/08/2012 Document Reviewed: 09/28/2011 [facility] Patient Information 2014 [place].

## 2013-12-03 NOTE — Assessment & Plan Note (Signed)
Well-controlled on amlodipine, labs. No edema noted

## 2013-12-03 NOTE — Assessment & Plan Note (Addendum)
Well-controlled with citalopram and Xanax as needed. UDS  low risk 02-2013 Plan:   UDS today

## 2013-12-03 NOTE — Progress Notes (Signed)
Pre visit review using our clinic review tool, if applicable. No additional management support is needed unless otherwise documented below in the visit note. 

## 2013-12-03 NOTE — Assessment & Plan Note (Addendum)
Symptoms well-controlled with Celebrex and tylenol. Check a BMP due to chronic use of Celebrex. UDS 8-14 low, UDS today

## 2013-12-04 ENCOUNTER — Telehealth: Payer: Self-pay | Admitting: Internal Medicine

## 2013-12-04 LAB — VITAMIN D 25 HYDROXY (VIT D DEFICIENCY, FRACTURES): VIT D 25 HYDROXY: 17 ng/mL — AB (ref 30–89)

## 2013-12-04 NOTE — Telephone Encounter (Signed)
Relevant patient education mailed to patient.  

## 2013-12-08 ENCOUNTER — Encounter: Payer: Self-pay | Admitting: *Deleted

## 2013-12-08 ENCOUNTER — Telehealth: Payer: Self-pay | Admitting: *Deleted

## 2013-12-08 DIAGNOSIS — E559 Vitamin D deficiency, unspecified: Secondary | ICD-10-CM

## 2013-12-08 MED ORDER — VITAMIN D (ERGOCALCIFEROL) 1.25 MG (50000 UNIT) PO CAPS: 50000.0000 [IU] | ORAL_CAPSULE | ORAL | Status: DC

## 2013-12-08 NOTE — Telephone Encounter (Signed)
Message copied by Chilton Greathouse on Tue Dec 08, 2013 10:22 AM ------      Message from: Kathlene November E      Created: Tue Dec 08, 2013  9:13 AM       Advise patient,       her cholesterol is under excellent control, the vitamin D is low again, other labs wnl.      Continue taking the same medications, in addition take :      -ergocalciferol 50,000 units one tablet weekly call #12 no refills      --also OTC vitamin D 1000 units every day ------

## 2013-12-08 NOTE — Telephone Encounter (Signed)
Left message with pt's spouse to return my call. 

## 2013-12-17 ENCOUNTER — Telehealth: Payer: Self-pay | Admitting: *Deleted

## 2013-12-17 NOTE — Telephone Encounter (Signed)
UDS LOW 12/04/13 

## 2013-12-18 NOTE — Telephone Encounter (Signed)
Caller name: Kniyah  Call back number:[phone] Pharmacy: [facility]  Reason for call:  Pt is needing refills on RX simvastatin (ZOCOR) 20 MG tablet and ALPRAZolam (XANAX) 0.25 MG

## 2013-12-23 MED ORDER — ALPRAZOLAM 0.25 MG PO TABS: 0.2500 mg | ORAL_TABLET | Freq: Every evening | ORAL | Status: DC | PRN

## 2013-12-23 MED ORDER — SIMVASTATIN 20 MG PO TABS: ORAL_TABLET | ORAL | Status: DC

## 2013-12-23 NOTE — Telephone Encounter (Signed)
Done Also , advise pt: due for a CPX, please arrange

## 2013-12-23 NOTE — Telephone Encounter (Signed)
rx faxed [facility] [address]

## 2013-12-23 NOTE — Telephone Encounter (Signed)
Xanax .25mg   Last OV-  12/03/13  Last refilled- 11/26/13 #60 / 0 rf  UDS- low risk.

## 2013-12-28 ENCOUNTER — Encounter: Payer: Self-pay | Admitting: Internal Medicine

## 2013-12-30 ENCOUNTER — Other Ambulatory Visit: Payer: Self-pay | Admitting: Internal Medicine

## 2014-01-18 ENCOUNTER — Other Ambulatory Visit: Payer: Self-pay | Admitting: Internal Medicine

## 2014-02-17 ENCOUNTER — Other Ambulatory Visit: Payer: Self-pay | Admitting: Internal Medicine

## 2014-03-01 ENCOUNTER — Other Ambulatory Visit: Payer: Self-pay | Admitting: Gynecology

## 2014-03-03 LAB — CYTOLOGY - PAP

## 2014-03-31 ENCOUNTER — Telehealth: Payer: Self-pay | Admitting: Internal Medicine

## 2014-03-31 MED ORDER — OMEPRAZOLE 20 MG PO CPDR: DELAYED_RELEASE_CAPSULE | ORAL | Status: DC

## 2014-03-31 NOTE — Telephone Encounter (Signed)
Pt is needing new rx for omeprazole (PRILOSEC) 20 MG capsule, send to [facility] on [address].

## 2014-03-31 NOTE — Telephone Encounter (Signed)
Medication sent to Pharmacy.   Pt will need to schedule appt for any further refills.

## 2014-03-31 NOTE — Telephone Encounter (Signed)
Appointment scheduled for pt. °

## 2014-04-07 ENCOUNTER — Ambulatory Visit (INDEPENDENT_AMBULATORY_CARE_PROVIDER_SITE_OTHER): Payer: Medicare Other | Admitting: Internal Medicine

## 2014-04-07 ENCOUNTER — Encounter: Payer: Self-pay | Admitting: Internal Medicine

## 2014-04-07 VITALS — BP 130/74 | HR 82 | Temp 97.8°F | Wt 166.5 lb

## 2014-04-07 DIAGNOSIS — Z23 Encounter for immunization: Secondary | ICD-10-CM

## 2014-04-07 DIAGNOSIS — F341 Dysthymic disorder: Secondary | ICD-10-CM

## 2014-04-07 DIAGNOSIS — R7309 Other abnormal glucose: Secondary | ICD-10-CM

## 2014-04-07 DIAGNOSIS — I10 Essential (primary) hypertension: Secondary | ICD-10-CM

## 2014-04-07 DIAGNOSIS — Z0000 Encounter for general adult medical examination without abnormal findings: Secondary | ICD-10-CM

## 2014-04-07 DIAGNOSIS — R739 Hyperglycemia, unspecified: Secondary | ICD-10-CM

## 2014-04-07 DIAGNOSIS — M1990 Unspecified osteoarthritis, unspecified site: Secondary | ICD-10-CM

## 2014-04-07 LAB — HEMOGLOBIN A1C: HEMOGLOBIN A1C: 5.8 % (ref 4.6–6.5)

## 2014-04-07 MED ORDER — OMEPRAZOLE 20 MG PO CPDR: DELAYED_RELEASE_CAPSULE | ORAL | Status: DC

## 2014-04-07 MED ORDER — CITALOPRAM HYDROBROMIDE 20 MG PO TABS: ORAL_TABLET | ORAL | Status: DC

## 2014-04-07 MED ORDER — SIMVASTATIN 20 MG PO TABS: ORAL_TABLET | ORAL | Status: DC

## 2014-04-07 MED ORDER — FAMOTIDINE 20 MG PO TABS: 20.0000 mg | ORAL_TABLET | Freq: Two times a day (BID) | ORAL | Status: DC

## 2014-04-07 MED ORDER — AMLODIPINE BESYLATE 10 MG PO TABS
ORAL_TABLET | ORAL | Status: DC
Start: 2014-04-07 — End: 2014-11-24

## 2014-04-07 MED ORDER — ALPRAZOLAM 0.25 MG PO TABS: 0.2500 mg | ORAL_TABLET | Freq: Every evening | ORAL | Status: DC | PRN

## 2014-04-07 MED ORDER — CELECOXIB 200 MG PO CAPS: 200.0000 mg | ORAL_CAPSULE | Freq: Every day | ORAL | Status: DC | PRN

## 2014-04-07 NOTE — Assessment & Plan Note (Signed)
UDS 11/2013 was low risk. Refill Xanax and SSRIs

## 2014-04-07 NOTE — Assessment & Plan Note (Signed)
Since the last time she was here, saw gynecology, had a mammogram, Pap and a  bone density test. Flu shot today.

## 2014-04-07 NOTE — Progress Notes (Signed)
Subjective:    Patient ID: [name], female    DOB: 1945-09-09, 68 y.o.   MRN: [id number]  DOS:  04/07/2014 Type of visit - description : routine Interval history: In general doing well, needs refill on all her medications for  anxiety, high blood pressure and GERD. Labs reviewed, due for A1c. Request a flu shot.    ROS Denies chest pain or difficulty breathing No  nausea, vomiting, diarrhea  Past Medical History  Diagnosis Date  . Anxiety and depression   . Eczema   . Hypertension   . Goiter   . Dizziness     severe: MRI chronic isch. changes and mastoiditis- saw Dr.Mundy(2007)  STATES SHE STILL HAS EPISODES- ESPECIALLY IF SHE GETS UP TOO QUICKLY AFTER LYING DOWN  . S/P cardiac cath 2009    Revealing nonobstructive CAD w/ tubular, discrete 40% mid lesion in the circumflex; discrete 30% proximal lesion in the LAD; luminal irregularities, 35% mid lesion in RCA; and generic 40% mid lesion in the RCA. Medical treatment recommended.  . Hyperlipidemia   . GERD (gastroesophageal reflux disease)   . Headache(784.0)   . Arthritis   . Pain     PAIN LOWER BACK AND DOWN RIGHT LEG WITH NUMBNESS, TINGLING RT LEG AND FOOT--STENOSIS  . Coronary artery disease     Past Surgical History  Procedure Laterality Date  . Cholecystectomy    . Abdominal hysterectomy      oophorectomy L only  . Shoulder surgery  2006    left   . Hand surgery  1990s    R hand x 2, L hand x 1  . Bunionectomy  1990s    LEFT  . Lumbar laminectomy/decompression microdiscectomy N/A 11/13/2012    Procedure: MICRO LUMBAR DECOMPRESSION L4 - L5 AND L2 - L3 2 LEVELS;  Surgeon: Johnn Hai, MD;  Location: [facility];  Service: Orthopedics;  Laterality: N/A;    History   Social History  . Marital Status: Married    Spouse Name: N/A    Number of Children: 4  . Years of Education: N/A   Occupational History  . retired     Social History Main Topics  . Smoking status: Former Research scientist (life sciences)  . Smokeless  tobacco: Never Used     Comment: quit aprox 2000 after 30 years, 1 to 1.5 ppd  . Alcohol Use: No  . Drug Use: No  . Sexual Activity: Not on file   Other Topics Concern  . Not on file   Social History Narrative   Lives w/ husband , 7 Gk, 95 GGk        Medication List       This list is accurate as of: 04/07/14  6:19 PM.  Always use your most recent med list.               ALPRAZolam 0.25 MG tablet  Commonly known as:  XANAX  Take 1-2 tablets (0.25-0.5 mg total) by mouth at bedtime as needed for sleep.     amLODipine 10 MG tablet  Commonly known as:  NORVASC  take 1 tablet by mouth once daily     aspirin 81 MG tablet  Take 81 mg by mouth every morning.     celecoxib 200 MG capsule  Commonly known as:  CELEBREX  Take 1 capsule (200 mg total) by mouth daily as needed.     citalopram 20 MG tablet  Commonly known as:  CELEXA  take 1 tablet  by mouth once daily     famotidine 20 MG tablet  Commonly known as:  PEPCID  Take 1 tablet (20 mg total) by mouth 2 (two) times daily.     NON FORMULARY  Calcium 1 gram, vit D 1000 u a day     omeprazole 20 MG capsule  Commonly known as:  PRILOSEC  take 1 capsule by mouth once daily     simvastatin 20 MG tablet  Commonly known as:  ZOCOR  Take one tablet by mouth at bedtime.     traMADol 50 MG tablet  Commonly known as:  ULTRAM  Take 50 mg by mouth 3 (three) times daily as needed for pain.           Objective:   Physical Exam BP 130/74  Pulse 82  Temp(Src) 97.8 F (36.6 C) (Oral)  Wt 166 lb 8 oz (75.524 kg)  SpO2 98% General -- alert, well-developed, NAD.   Lungs -- normal respiratory effort, no intercostal retractions, no accessory muscle use, and normal breath sounds.  Heart-- normal rate, regular rhythm, no murmur.  Extremities-- no pretibial edema bilaterally  Neurologic--  alert & oriented X3. Speech normal, gait appropriate for age, strength symmetric and appropriate for age.  Psych-- Cognition and  judgment appear intact. Cooperative with normal attention span and concentration. No anxious or depressed appearing.     Assessment & Plan:   Vitamin D deficiency, status post ergocalciferol, and now on OTCs

## 2014-04-07 NOTE — Assessment & Plan Note (Signed)
Celebrex RF

## 2014-04-07 NOTE — Progress Notes (Signed)
Pre visit review using our clinic review tool, if applicable. No additional management support is needed unless otherwise documented below in the visit note. 

## 2014-04-07 NOTE — Assessment & Plan Note (Signed)
BP well-controlled, refill medications

## 2014-04-07 NOTE — Patient Instructions (Signed)
Get your blood work before you leave     Please come back to the office by 09-2014 for a physical exam. Come back fasting    Stop by the front desk and schedule the visit

## 2014-04-07 NOTE — Assessment & Plan Note (Signed)
Check A1c. 

## 2014-08-02 ENCOUNTER — Encounter: Payer: Self-pay | Admitting: Gastroenterology

## 2014-11-23 ENCOUNTER — Telehealth: Payer: Self-pay | Admitting: *Deleted

## 2014-11-23 ENCOUNTER — Encounter: Payer: Self-pay | Admitting: *Deleted

## 2014-11-23 NOTE — Telephone Encounter (Signed)
Pre-Visit Call completed with patient and chart updated.   Pre-Visit Info documented in Specialty Comments under [employer].    

## 2014-11-24 ENCOUNTER — Encounter: Payer: Self-pay | Admitting: Internal Medicine

## 2014-11-24 ENCOUNTER — Ambulatory Visit (INDEPENDENT_AMBULATORY_CARE_PROVIDER_SITE_OTHER): Payer: Medicare Other | Admitting: Internal Medicine

## 2014-11-24 VITALS — BP 122/62 | HR 81 | Temp 97.7°F | Ht 61.0 in | Wt 165.5 lb

## 2014-11-24 DIAGNOSIS — I10 Essential (primary) hypertension: Secondary | ICD-10-CM | POA: Diagnosis not present

## 2014-11-24 DIAGNOSIS — N951 Menopausal and female climacteric states: Secondary | ICD-10-CM

## 2014-11-24 DIAGNOSIS — M150 Primary generalized (osteo)arthritis: Secondary | ICD-10-CM

## 2014-11-24 DIAGNOSIS — E785 Hyperlipidemia, unspecified: Secondary | ICD-10-CM

## 2014-11-24 DIAGNOSIS — F341 Dysthymic disorder: Secondary | ICD-10-CM | POA: Diagnosis not present

## 2014-11-24 DIAGNOSIS — L0591 Pilonidal cyst without abscess: Secondary | ICD-10-CM

## 2014-11-24 DIAGNOSIS — Z0000 Encounter for general adult medical examination without abnormal findings: Secondary | ICD-10-CM | POA: Diagnosis not present

## 2014-11-24 DIAGNOSIS — E042 Nontoxic multinodular goiter: Secondary | ICD-10-CM

## 2014-11-24 DIAGNOSIS — Z9889 Other specified postprocedural states: Secondary | ICD-10-CM

## 2014-11-24 DIAGNOSIS — M159 Polyosteoarthritis, unspecified: Secondary | ICD-10-CM

## 2014-11-24 DIAGNOSIS — K219 Gastro-esophageal reflux disease without esophagitis: Secondary | ICD-10-CM

## 2014-11-24 MED ORDER — CELECOXIB 200 MG PO CAPS: 200.0000 mg | ORAL_CAPSULE | Freq: Every day | ORAL | Status: DC | PRN

## 2014-11-24 MED ORDER — CITALOPRAM HYDROBROMIDE 20 MG PO TABS: 20.0000 mg | ORAL_TABLET | Freq: Every day | ORAL | Status: DC

## 2014-11-24 MED ORDER — SIMVASTATIN 20 MG PO TABS: 20.0000 mg | ORAL_TABLET | Freq: Every day | ORAL | Status: DC

## 2014-11-24 MED ORDER — OMEPRAZOLE 20 MG PO CPDR: 20.0000 mg | DELAYED_RELEASE_CAPSULE | Freq: Every day | ORAL | Status: DC

## 2014-11-24 MED ORDER — AMLODIPINE BESYLATE 10 MG PO TABS: 10.0000 mg | ORAL_TABLET | Freq: Every day | ORAL | Status: DC

## 2014-11-24 MED ORDER — ALPRAZOLAM 0.25 MG PO TABS: 0.2500 mg | ORAL_TABLET | Freq: Every evening | ORAL | Status: DC | PRN

## 2014-11-24 NOTE — Patient Instructions (Signed)
Stop by the lab today and provide a urine sample for a UDS  Please schedule labs to be done within few days (fasting)  Come back to the office in 6 months  for a routine check up      Fall Prevention and [place] cause injuries and can affect all age groups. It is possible to use preventive measures to significantly decrease the likelihood of falls. There are many simple measures which can make your home safer and prevent falls. OUTDOORS  Repair cracks and edges of walkways and driveways.  Remove high doorway thresholds.  Trim shrubbery on the main path into your home.  Have good outside lighting.  Clear walkways of tools, rocks, debris, and clutter.  Check that handrails are not broken and are securely fastened. Both sides of steps should have handrails.  Have leaves, snow, and ice cleared regularly.  Use sand or salt on walkways during winter months.  In the garage, clean up grease or oil spills. BATHROOM  Install night lights.  Install grab bars by the toilet and in the tub and shower.  Use non-skid mats or decals in the tub or shower.  Place a plastic non-slip stool in the shower to sit on, if needed.  Keep floors dry and clean up all water on the floor immediately.  Remove soap buildup in the tub or shower on a regular basis.  Secure bath mats with non-slip, double-sided rug tape.  Remove throw rugs and tripping hazards from the floors. BEDROOMS  Install night lights.  Make sure a bedside light is easy to reach.  Do not use oversized bedding.  Keep a telephone by your bedside.  Have a firm chair with side arms to use for getting dressed.  Remove throw rugs and tripping hazards from the floor. KITCHEN  Keep handles on pots and pans turned toward the center of the stove. Use back burners when possible.  Clean up spills quickly and allow time for drying.  Avoid walking on wet floors.  Avoid hot utensils and knives.  Position shelves so  they are not too high or low.  Place commonly used objects within easy reach.  If necessary, use a sturdy step stool with a grab bar when reaching.  Keep electrical cables out of the way.  Do not use floor polish or wax that makes floors slippery. If you must use wax, use non-skid floor wax.  Remove throw rugs and tripping hazards from the floor. STAIRWAYS  Never leave objects on stairs.  Place handrails on both sides of stairways and use them. Fix any loose handrails. Make sure handrails on both sides of the stairways are as long as the stairs.  Check carpeting to make sure it is firmly attached along stairs. Make repairs to worn or loose carpet promptly.  Avoid placing throw rugs at the top or bottom of stairways, or properly secure the rug with carpet tape to prevent slippage. Get rid of throw rugs, if possible.  Have an electrician put in a light switch at the top and bottom of the stairs. OTHER FALL PREVENTION TIPS  Wear low-heel or rubber-soled shoes that are supportive and fit well. Wear closed toe shoes.  When using a stepladder, make sure it is fully opened and both spreaders are firmly locked. Do not climb a closed stepladder.  Add color or contrast paint or tape to grab bars and handrails in your home. Place contrasting color strips on first and last steps.  Learn and  use mobility aids as needed. Install an electrical emergency response system.  Turn on lights to avoid dark areas. Replace light bulbs that burn out immediately. Get light switches that glow.  Arrange furniture to create clear pathways. Keep furniture in the same place.  Firmly attach carpet with non-skid or double-sided tape.  Eliminate uneven floor surfaces.  Select a carpet pattern that does not visually hide the edge of steps.  Be aware of all pets. OTHER HOME SAFETY TIPS  Set the water temperature for 120 F (48.8 C).  Keep emergency numbers on or near the telephone.  Keep smoke  detectors on every level of the home and near sleeping areas. Document Released: 06/29/2002 Document Revised: 01/08/2012 Document Reviewed: 09/28/2011 [facility] Patient Information 2015 [place], [place]. This information is not intended to replace advice given to you by your health care provider. Make sure you discuss any questions you have with your health care provider.   Preventive Care for Adults Ages 63 and over  Blood pressure check.** / Every 1 to 2 years.  Lipid and cholesterol check.**/ Every 5 years beginning at age 55.  Lung cancer screening. / Every year if you are aged 61-80 years and have a 30-pack-year history of smoking and currently smoke or have quit within the past 15 years. Yearly screening is stopped once you have quit smoking for at least 15 years or develop a health problem that would prevent you from having lung cancer treatment.  Fecal occult blood test (FOBT) of stool. / Every year beginning at age 63 and continuing until age 78. You may not have to do this test if you get a colonoscopy every 10 years.  Flexible sigmoidoscopy** or colonoscopy.** / Every 5 years for a flexible sigmoidoscopy or every 10 years for a colonoscopy beginning at age 56 and continuing until age 63.  Hepatitis C blood test.** / For all people born from [birth year] through 1965 and any individual with known risks for hepatitis C.  Abdominal aortic aneurysm (AAA) screening.** / A one-time screening for ages 15 to 77 years who are current or former smokers.  Skin self-exam. / Monthly.  Influenza vaccine. / Every year.  Tetanus, diphtheria, and acellular pertussis (Tdap/Td) vaccine.** / 1 dose of Td every 10 years.  Varicella vaccine.** / Consult your health care provider.  Zoster vaccine.** / 1 dose for adults aged 61 years or older.  Pneumococcal 13-valent conjugate (PCV13) vaccine.** / Consult your health care provider.  Pneumococcal polysaccharide (PPSV23) vaccine.** / 1 dose for all adults  aged 42 years and older.  Meningococcal vaccine.** / Consult your health care provider.  Hepatitis A vaccine.** / Consult your health care provider.  Hepatitis B vaccine.** / Consult your health care provider.  Haemophilus influenzae type b (Hib) vaccine.** / Consult your health care provider. **Family history and personal history of risk and conditions may change your health care provider's recommendations. Document Released: 09/04/2001 Document Revised: 07/14/2013 Document Reviewed: 12/04/2010 [facility] Patient Information 2015 [place], [place]. This information is not intended to replace advice given to you by your health care provider. Make sure you discuss any questions you have with your health care provider.

## 2014-11-24 NOTE — Assessment & Plan Note (Signed)
Refill medications, BP well-controlled

## 2014-11-24 NOTE — Assessment & Plan Note (Signed)
Symptoms well-controlled, refill medications, check a UDS

## 2014-11-24 NOTE — Assessment & Plan Note (Signed)
Refill simvastatin, labs

## 2014-11-24 NOTE — Assessment & Plan Note (Addendum)
Td   2012  pneumonia shot 2010 prevnar--2015  Shingles shot 2013  Sees gyn, last visit 8-15, had a PAP    MMG  2015  Neg, per gyn  Last colonoscopy  07-2009, due for a colonoscopy, GI letter reprinted, encouraged to call.  DEXA 07-2009 neg , not sure if she had one 2015 @ gyn--- order a DEXA  Other issues Eczema, skin of the right ear, on OTC steroids as needed Refer to surgery per patient request, has a cyst at the [place]. History of vitamin D deficiency, on qd supplements---->  labs

## 2014-11-24 NOTE — Progress Notes (Signed)
Pre visit review using our clinic review tool, if applicable. No additional management support is needed unless otherwise documented below in the visit note. 

## 2014-11-24 NOTE — Progress Notes (Signed)
Subjective:    Patient ID: [name], female    DOB: 09/11/1945, 69 y.o.   MRN: [id number]  DOS:  11/24/2014 Type of visit - description :  Interval history:  Medicare physical 1. Risk factors based on Past M, S, F history: reviewed  2. Physical Activities:  Active w/  home chores , occ walk  3. Depression/mood: well controlled w/ meds  4. Hearing: no problems noted or reported  5. ADL's: Totally independent , still drives  6. Fall Risk: no recent falls, see instructions @ AVS 7. home Safety: does feelsafe at home  8. Height, weight, &visual acuity: see VS, vision corrected w/ glasses, has cataracts , sees eye MD regularly  9. Counseling: provided  10. Labs ordered based on risk factors: if needed  11. Referral Coordination: if needed  12. Care Plan, see assessment and plan , written plan provided, see AVS 13. Cognitive Assessment: cognition and motor skills wnl  14. Care team updated 15. End of life care discussed, she already talk about that w/ her family  In addition, today we discussed the following: Hypertension, good compliance of medication, needs a refill. No ambulatory BPs  High cholesterol, good compliance of medication, needs a refill of simvastatin Has a "cyst" at the tailbone, it gets irritated from time to time of her when she sits down for too long, request a referral. GERD symptoms, return to taking only omeprazole, symptoms well-controlled. Pain control: Celebrex and Ultram as needed.  Review of Systems Constitutional: No fever, chills. No unexplained wt changes. No unusual sweats HEENT: No dental problems, ear discharge, facial swelling, voice changes. No eye discharge, redness or intolerance to light Respiratory: No wheezing or difficulty breathing. No cough , mucus production Cardiovascular: No CP, leg swelling or palpitations GI: no nausea, vomiting, diarrhea or abdominal pain.  No blood in the stools. No dysphagia   Endocrine: No  polyphagia, polyuria or polydipsia GU: No dysuria, gross hematuria, difficulty urinating. No urinary urgency or frequency. Musculoskeletal: No joint swellings or unusual aches or pains Skin: No change in the color of the skin, palor or rash Allergic, immunologic: No environmental allergies or food allergies Neurological: No dizziness or syncope. No headaches. No diplopia, slurred speech, motor deficits, facial numbness Hematological: No enlarged lymph nodes, easy bruising or bleeding Psychiatry: No suicidal ideas, hallucinations, behavior problems or confusion. No unusual/severe anxiety or depression.     Past Medical History  Diagnosis Date  . Anxiety and depression   . Eczema   . Hypertension   . Goiter   . Dizziness     severe: MRI chronic isch. changes and mastoiditis- saw Dr.Mundy(2007)  STATES SHE STILL HAS EPISODES- ESPECIALLY IF SHE GETS UP TOO QUICKLY AFTER LYING DOWN  . S/P cardiac cath 2009    Revealing nonobstructive CAD w/ tubular, discrete 40% mid lesion in the circumflex; discrete 30% proximal lesion in the LAD; luminal irregularities, 35% mid lesion in RCA; and generic 40% mid lesion in the RCA. Medical treatment recommended.  . Hyperlipidemia   . GERD (gastroesophageal reflux disease)   . Headache(784.0)   . Arthritis   . Pain     PAIN LOWER BACK AND DOWN RIGHT LEG WITH NUMBNESS, TINGLING RT LEG AND FOOT--STENOSIS  . Coronary artery disease   . S/P cardiac cath 11/24/2014    Revealing nonobstructive CAD w/ tubular, discrete 40% mid lesion in the circumflex; discrete 30% proximal lesion in the LAD; luminal irregularities, 35% mid lesion in RCA; and generic 40%  mid lesion in the RCA. Medical treatment recommended.    Past Surgical History  Procedure Laterality Date  . Cholecystectomy    . Abdominal hysterectomy      oophorectomy L only  . Shoulder surgery  2006    left   . Hand surgery  1990s    R hand x 2, L hand x 1  . Bunionectomy  1990s    LEFT  . Lumbar  laminectomy/decompression microdiscectomy N/A 11/13/2012    Procedure: MICRO LUMBAR DECOMPRESSION L4 - L5 AND L2 - L3 2 LEVELS;  Surgeon: Johnn Hai, MD;  Location: [facility];  Service: Orthopedics;  Laterality: N/A;    History   Social History  . Marital Status: Married    Spouse Name: N/A  . Number of Children: 4  . Years of Education: N/A   Occupational History  . retired     Social History Main Topics  . Smoking status: Former Research scientist (life sciences)  . Smokeless tobacco: Never Used     Comment: quit aprox 2000 after 30 years, 1 to 1.5 ppd  . Alcohol Use: No  . Drug Use: No  . Sexual Activity: Not on file   Other Topics Concern  . Not on file   Social History Narrative   Lives w/ husband , 7 Gk, 7 GGk     Family History  Problem Relation Age of Onset  . Colon cancer Neg Hx   . Breast cancer Other     2 cousins  . Throat cancer Other     cousin  . Heart attack Brother     3 brother   . Lung cancer Mother     cousin  . Lung cancer Brother   . Diabetes Neg Hx        Medication List       This list is accurate as of: 11/24/14  6:42 PM.  Always use your most recent med list.               ALPRAZolam 0.25 MG tablet  Commonly known as:  XANAX  Take 1-2 tablets (0.25-0.5 mg total) by mouth at bedtime as needed for sleep.     amLODipine 10 MG tablet  Commonly known as:  NORVASC  Take 1 tablet (10 mg total) by mouth daily.     aspirin 81 MG tablet  Take 81 mg by mouth every morning.     celecoxib 200 MG capsule  Commonly known as:  CELEBREX  Take 1 capsule (200 mg total) by mouth daily as needed.     citalopram 20 MG tablet  Commonly known as:  CELEXA  Take 1 tablet (20 mg total) by mouth daily.     NON FORMULARY  Calcium 1 gram, vit D 1000 u a day     omeprazole 20 MG capsule  Commonly known as:  PRILOSEC  Take 1 capsule (20 mg total) by mouth daily.     simvastatin 20 MG tablet  Commonly known as:  ZOCOR  Take 1 tablet (20 mg total) by mouth at bedtime.       traMADol 50 MG tablet  Commonly known as:  ULTRAM  Take 50 mg by mouth 3 (three) times daily as needed for pain.           Objective:   Physical Exam BP 122/62 mmHg  Pulse 81  Temp(Src) 97.7 F (36.5 C) (Oral)  Ht 5\' 1"  (1.549 m)  Wt 165 lb 8 oz (75.07 kg)  BMI 31.29 kg/m2  SpO2 94% General:   Well developed, well nourished . NAD.  Neck:  Full range of motion. Supple. No  thyromegaly , normal carotid pulse HEENT:  Normocephalic . Face symmetric, atraumatic Lungs:  CTA B Normal respiratory effort, no intercostal retractions, no accessory muscle use. Heart: RRR,  no murmur.  No pretibial edema bilaterally  Abdomen:  Not distended, soft, non-tender. No rebound or rigidity. No mass,organomegaly Skin: Exposed areas without rash. Not pale. Not jaundice Area between the buttocks examining, she has minute cyst, no TTP, no discharge. Neurologic:  alert & oriented X3.  Speech normal, gait appropriate for age and unassisted Strength symmetric and appropriate for age.  Psych: Cognition and judgment appear intact.  Cooperative with normal attention span and concentration.  Behavior appropriate. No anxious or depressed appearing.        Assessment & Plan:

## 2014-11-24 NOTE — Assessment & Plan Note (Signed)
Symptoms currently well controlled on PPIs only, no change

## 2014-11-24 NOTE — Assessment & Plan Note (Addendum)
Exam negative but the last ultrasound was few  years ago, recheck ultrasound

## 2014-11-24 NOTE — Assessment & Plan Note (Signed)
Well-controlled with as needed use of Celebrex and Ultram, check a UDS

## 2014-12-01 ENCOUNTER — Other Ambulatory Visit: Payer: Medicare Other

## 2014-12-01 DIAGNOSIS — M150 Primary generalized (osteo)arthritis: Secondary | ICD-10-CM | POA: Diagnosis not present

## 2014-12-01 DIAGNOSIS — Z0000 Encounter for general adult medical examination without abnormal findings: Secondary | ICD-10-CM | POA: Diagnosis not present

## 2014-12-01 DIAGNOSIS — K219 Gastro-esophageal reflux disease without esophagitis: Secondary | ICD-10-CM | POA: Diagnosis not present

## 2014-12-01 DIAGNOSIS — Z9889 Other specified postprocedural states: Secondary | ICD-10-CM | POA: Diagnosis not present

## 2014-12-01 DIAGNOSIS — F341 Dysthymic disorder: Secondary | ICD-10-CM | POA: Diagnosis not present

## 2014-12-01 LAB — CBC WITH DIFFERENTIAL/PLATELET
BASOS ABS: 0 10*3/uL (ref 0.0–0.1)
Basophils Relative: 0.3 % (ref 0.0–3.0)
Eosinophils Absolute: 0.1 10*3/uL (ref 0.0–0.7)
Eosinophils Relative: 1.1 % (ref 0.0–5.0)
HEMATOCRIT: 41.3 % (ref 36.0–46.0)
Hemoglobin: 13.7 g/dL (ref 12.0–15.0)
Lymphocytes Relative: 34.2 % (ref 12.0–46.0)
Lymphs Abs: 1.8 10*3/uL (ref 0.7–4.0)
MCHC: 33.1 g/dL (ref 30.0–36.0)
MCV: 92.9 fl (ref 78.0–100.0)
MONO ABS: 0.4 10*3/uL (ref 0.1–1.0)
MONOS PCT: 6.7 % (ref 3.0–12.0)
Neutro Abs: 3 10*3/uL (ref 1.4–7.7)
Neutrophils Relative %: 57.7 % (ref 43.0–77.0)
Platelets: 186 10*3/uL (ref 150.0–400.0)
RBC: 4.44 Mil/uL (ref 3.87–5.11)
RDW: 13.6 % (ref 11.5–15.5)
WBC: 5.2 10*3/uL (ref 4.0–10.5)

## 2014-12-01 LAB — BASIC METABOLIC PANEL
BUN: 11 mg/dL (ref 6–23)
CHLORIDE: 106 meq/L (ref 96–112)
CO2: 28 meq/L (ref 19–32)
CREATININE: 0.71 mg/dL (ref 0.40–1.20)
Calcium: 9.3 mg/dL (ref 8.4–10.5)
GFR: 86.28 mL/min (ref >60.00)
Glucose, Bld: 113 mg/dL — ABNORMAL HIGH (ref 70–99)
Potassium: 3.9 mEq/L (ref 3.5–5.1)
Sodium: 139 mEq/L (ref 135–145)

## 2014-12-01 LAB — LIPID PANEL
Cholesterol: 145 mg/dL (ref 0–200)
HDL: 48.4 mg/dL (ref >39.00)
LDL CALC: 73 mg/dL (ref 0–99)
NonHDL: 96.6
Total CHOL/HDL Ratio: 3
Triglycerides: 120 mg/dL (ref 0.0–149.0)
VLDL: 24 mg/dL (ref 0.0–40.0)

## 2014-12-01 LAB — ALT: ALT: 10 U/L (ref 0–35)

## 2014-12-01 LAB — AST: AST: 16 U/L (ref 0–37)

## 2014-12-04 LAB — VITAMIN D 1,25 DIHYDROXY
VITAMIN D 1, 25 (OH) TOTAL: 90 pg/mL — AB (ref 18–72)
VITAMIN D2 1, 25 (OH): 50 pg/mL
Vitamin D3 1, 25 (OH)2: 40 pg/mL

## 2014-12-06 ENCOUNTER — Encounter (HOSPITAL_COMMUNITY): Payer: Self-pay | Admitting: Emergency Medicine

## 2014-12-06 ENCOUNTER — Emergency Department (HOSPITAL_COMMUNITY): Payer: No Typology Code available for payment source

## 2014-12-06 ENCOUNTER — Ambulatory Visit (INDEPENDENT_AMBULATORY_CARE_PROVIDER_SITE_OTHER)
Admission: RE | Admit: 2014-12-06 | Discharge: 2014-12-06 | Disposition: A | Discharge status: Home / Self Care | Payer: Medicare Other | Source: Ambulatory Visit | Attending: Internal Medicine | Admitting: Internal Medicine

## 2014-12-06 ENCOUNTER — Encounter: Payer: Self-pay | Admitting: Gastroenterology

## 2014-12-06 ENCOUNTER — Emergency Department (HOSPITAL_COMMUNITY)
Admission: EM | Admit: 2014-12-06 | Discharge: 2014-12-06 | Disposition: A | Discharge status: Home / Self Care | Payer: No Typology Code available for payment source | Attending: Emergency Medicine | Admitting: Emergency Medicine

## 2014-12-06 DIAGNOSIS — F329 Major depressive disorder, single episode, unspecified: Secondary | ICD-10-CM | POA: Diagnosis not present

## 2014-12-06 DIAGNOSIS — S60221A Contusion of right hand, initial encounter: Secondary | ICD-10-CM | POA: Diagnosis not present

## 2014-12-06 DIAGNOSIS — S0990XA Unspecified injury of head, initial encounter: Secondary | ICD-10-CM | POA: Diagnosis not present

## 2014-12-06 DIAGNOSIS — Z9889 Other specified postprocedural states: Secondary | ICD-10-CM | POA: Insufficient documentation

## 2014-12-06 DIAGNOSIS — M533 Sacrococcygeal disorders, not elsewhere classified: Secondary | ICD-10-CM | POA: Diagnosis not present

## 2014-12-06 DIAGNOSIS — N951 Menopausal and female climacteric states: Secondary | ICD-10-CM

## 2014-12-06 DIAGNOSIS — I2510 Atherosclerotic heart disease of native coronary artery without angina pectoris: Secondary | ICD-10-CM | POA: Insufficient documentation

## 2014-12-06 DIAGNOSIS — Z79899 Other long term (current) drug therapy: Secondary | ICD-10-CM | POA: Diagnosis not present

## 2014-12-06 DIAGNOSIS — Z780 Asymptomatic menopausal state: Secondary | ICD-10-CM | POA: Diagnosis not present

## 2014-12-06 DIAGNOSIS — S0031XA Abrasion of nose, initial encounter: Secondary | ICD-10-CM | POA: Insufficient documentation

## 2014-12-06 DIAGNOSIS — Y998 Other external cause status: Secondary | ICD-10-CM | POA: Insufficient documentation

## 2014-12-06 DIAGNOSIS — Z872 Personal history of diseases of the skin and subcutaneous tissue: Secondary | ICD-10-CM | POA: Diagnosis not present

## 2014-12-06 DIAGNOSIS — M25561 Pain in right knee: Secondary | ICD-10-CM | POA: Diagnosis not present

## 2014-12-06 DIAGNOSIS — E785 Hyperlipidemia, unspecified: Secondary | ICD-10-CM | POA: Insufficient documentation

## 2014-12-06 DIAGNOSIS — S6991XA Unspecified injury of right wrist, hand and finger(s), initial encounter: Secondary | ICD-10-CM | POA: Diagnosis not present

## 2014-12-06 DIAGNOSIS — R6889 Other general symptoms and signs: Secondary | ICD-10-CM | POA: Diagnosis not present

## 2014-12-06 DIAGNOSIS — Z7982 Long term (current) use of aspirin: Secondary | ICD-10-CM | POA: Insufficient documentation

## 2014-12-06 DIAGNOSIS — R51 Headache: Secondary | ICD-10-CM | POA: Diagnosis not present

## 2014-12-06 DIAGNOSIS — S8991XA Unspecified injury of right lower leg, initial encounter: Secondary | ICD-10-CM | POA: Diagnosis not present

## 2014-12-06 DIAGNOSIS — M79641 Pain in right hand: Secondary | ICD-10-CM | POA: Diagnosis not present

## 2014-12-06 DIAGNOSIS — F419 Anxiety disorder, unspecified: Secondary | ICD-10-CM | POA: Insufficient documentation

## 2014-12-06 DIAGNOSIS — Y9389 Activity, other specified: Secondary | ICD-10-CM | POA: Diagnosis not present

## 2014-12-06 DIAGNOSIS — M1990 Unspecified osteoarthritis, unspecified site: Secondary | ICD-10-CM | POA: Diagnosis not present

## 2014-12-06 DIAGNOSIS — K219 Gastro-esophageal reflux disease without esophagitis: Secondary | ICD-10-CM | POA: Diagnosis not present

## 2014-12-06 DIAGNOSIS — Z87891 Personal history of nicotine dependence: Secondary | ICD-10-CM | POA: Insufficient documentation

## 2014-12-06 DIAGNOSIS — Y92410 Unspecified street and highway as the place of occurrence of the external cause: Secondary | ICD-10-CM | POA: Insufficient documentation

## 2014-12-06 DIAGNOSIS — J3489 Other specified disorders of nose and nasal sinuses: Secondary | ICD-10-CM | POA: Diagnosis not present

## 2014-12-06 DIAGNOSIS — I10 Essential (primary) hypertension: Secondary | ICD-10-CM | POA: Insufficient documentation

## 2014-12-06 MED ORDER — ALPRAZOLAM 0.25 MG PO TABS
0.2500 mg | ORAL_TABLET | Freq: Once | ORAL | Status: AC
  Administered 2014-12-06: 0.25 mg via ORAL
  Filled 2014-12-06: qty 1

## 2014-12-06 NOTE — Discharge Instructions (Signed)
Take tramadol as needed for pain.  Please follow with your primary care doctor in the next 2 days for a check-up. They must obtain records for further management.   Do not hesitate to return to the Emergency Department for any new, worsening or concerning symptoms.

## 2014-12-06 NOTE — ED Notes (Signed)
Pt ambulatory with steady gait to CT.
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
## 2014-12-10 ENCOUNTER — Ambulatory Visit (INDEPENDENT_AMBULATORY_CARE_PROVIDER_SITE_OTHER): Payer: Medicare Other | Admitting: Internal Medicine

## 2014-12-10 ENCOUNTER — Encounter: Payer: Self-pay | Admitting: Internal Medicine

## 2014-12-10 VITALS — BP 128/78 | HR 79 | Temp 97.5°F | Ht 61.0 in | Wt 170.2 lb

## 2014-12-10 DIAGNOSIS — T07XXXA Unspecified multiple injuries, initial encounter: Secondary | ICD-10-CM

## 2014-12-10 DIAGNOSIS — T148 Other injury of unspecified body region: Secondary | ICD-10-CM
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
## 2014-12-10 NOTE — Progress Notes (Signed)
Pre visit review using our clinic review tool, if applicable. No additional management support is needed unless otherwise documented below in the visit note. 

## 2014-12-10 NOTE — Patient Instructions (Signed)
Call if not gradually improving

## 2014-12-23 ENCOUNTER — Ambulatory Visit (HOSPITAL_BASED_OUTPATIENT_CLINIC_OR_DEPARTMENT_OTHER)
Admission: RE | Admit: 2014-12-23 | Discharge: 2014-12-23 | Disposition: A | Discharge status: Home / Self Care | Payer: Medicare Other | Source: Ambulatory Visit | Attending: Internal Medicine | Admitting: Internal Medicine

## 2014-12-23 DIAGNOSIS — E041 Nontoxic single thyroid nodule: Secondary | ICD-10-CM | POA: Diagnosis present

## 2014-12-23 DIAGNOSIS — E042 Nontoxic multinodular goiter: Secondary | ICD-10-CM | POA: Insufficient documentation

## 2014-12-23 DIAGNOSIS — E039 Hypothyroidism, unspecified: Secondary | ICD-10-CM | POA: Diagnosis not present

## 2015-03-10 ENCOUNTER — Encounter: Payer: Self-pay | Admitting: Gastroenterology

## 2015-03-10 DIAGNOSIS — Z1231 Encounter for screening mammogram for malignant neoplasm of breast: Secondary | ICD-10-CM | POA: Diagnosis not present

## 2015-03-10 DIAGNOSIS — Z6833 Body mass index (BMI) 33.0-33.9, adult: Secondary | ICD-10-CM | POA: Diagnosis not present

## 2015-03-10 DIAGNOSIS — Z01419 Encounter for gynecological examination (general) (routine) without abnormal findings: Secondary | ICD-10-CM | POA: Diagnosis not present

## 2015-06-01 ENCOUNTER — Encounter: Payer: Self-pay | Admitting: Internal Medicine

## 2015-06-01 ENCOUNTER — Ambulatory Visit (INDEPENDENT_AMBULATORY_CARE_PROVIDER_SITE_OTHER): Payer: Medicare Other | Admitting: Internal Medicine

## 2015-06-01 VITALS — BP 126/78 | HR 88 | Temp 97.8°F | Ht 61.0 in | Wt 171.0 lb

## 2015-06-01 DIAGNOSIS — I10 Essential (primary) hypertension: Secondary | ICD-10-CM

## 2015-06-01 DIAGNOSIS — Z23 Encounter for immunization: Secondary | ICD-10-CM

## 2015-06-01 DIAGNOSIS — Z09 Encounter for follow-up examination after completed treatment for conditions other than malignant neoplasm: Secondary | ICD-10-CM

## 2015-06-01 DIAGNOSIS — E785 Hyperlipidemia, unspecified: Secondary | ICD-10-CM

## 2015-06-01 DIAGNOSIS — F341 Dysthymic disorder: Secondary | ICD-10-CM

## 2015-06-01 MED ORDER — CELECOXIB 200 MG PO CAPS: 200.0000 mg | ORAL_CAPSULE | Freq: Every day | ORAL | Status: DC | PRN

## 2015-06-01 MED ORDER — CITALOPRAM HYDROBROMIDE 20 MG PO TABS: 20.0000 mg | ORAL_TABLET | Freq: Every day | ORAL | Status: DC

## 2015-06-01 MED ORDER — OMEPRAZOLE 20 MG PO CPDR: 20.0000 mg | DELAYED_RELEASE_CAPSULE | Freq: Every day | ORAL | Status: DC

## 2015-06-01 MED ORDER — SIMVASTATIN 20 MG PO TABS: 20.0000 mg | ORAL_TABLET | Freq: Every day | ORAL | Status: DC

## 2015-06-01 MED ORDER — AMLODIPINE BESYLATE 10 MG PO TABS: 10.0000 mg | ORAL_TABLET | Freq: Every day | ORAL | Status: DC
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
## 2015-06-01 NOTE — Progress Notes (Signed)
Pre visit review using our clinic review tool, if applicable. No additional management support is needed unless otherwise documented below in the visit note. 

## 2015-06-01 NOTE — Patient Instructions (Signed)
Go to the lab for a urine sample (UDS)   Next visit  for a  physical exam in 6 months     Please schedule an appointment at the front desk Please come back fasting

## 2015-06-02 DIAGNOSIS — Z09 Encounter for follow-up examination after completed treatment for conditions other than malignant neoplasm: Secondary | ICD-10-CM | POA: Insufficient documentation

## 2015-06-02 NOTE — Assessment & Plan Note (Signed)
HTN: Well-controlled, RF amlodipine Hyperlipidemia: Last FLP satisfactory, refill simvastatin  Insomnia: on xanax, contract signed today, check a UDS, RF prn Depression anxiety: Refill Celexa MSK: Refill Celebrex, Ultram prescribed by a different provider. Primary care: Flu shot

## 2015-07-01 ENCOUNTER — Other Ambulatory Visit: Payer: Self-pay | Admitting: Internal Medicine

## 2015-07-01 NOTE — Telephone Encounter (Signed)
Okay #60 and 5 refills 

## 2015-07-01 NOTE — Telephone Encounter (Signed)
Rx faxed to [facility] pharmacy.  

## 2015-07-01 NOTE — Telephone Encounter (Signed)
Pt is requesting refill on Alprazolam.  Last OV: 06/01/2015 Last Fill: 11/24/2014 #60 and 5RF UDS: Pending  Please advise.

## 2015-07-01 NOTE — Telephone Encounter (Signed)
Rx printed, awaiting MD signature.  

## 2016-01-09 ENCOUNTER — Other Ambulatory Visit: Payer: Self-pay

## 2016-01-09 MED ORDER — CITALOPRAM HYDROBROMIDE 20 MG PO TABS: 20.0000 mg | ORAL_TABLET | Freq: Every day | ORAL | Status: DC

## 2016-01-09 MED ORDER — OMEPRAZOLE 20 MG PO CPDR: 20.0000 mg | DELAYED_RELEASE_CAPSULE | Freq: Every day | ORAL | Status: DC

## 2016-01-09 MED ORDER — SIMVASTATIN 20 MG PO TABS: 20.0000 mg | ORAL_TABLET | Freq: Every day | ORAL | Status: DC

## 2016-01-09 MED ORDER — AMLODIPINE BESYLATE 10 MG PO TABS: 10.0000 mg | ORAL_TABLET | Freq: Every day | ORAL | Status: DC

## 2016-02-06 ENCOUNTER — Other Ambulatory Visit: Payer: Self-pay

## 2016-02-06 MED ORDER — CITALOPRAM HYDROBROMIDE 20 MG PO TABS: 20.0000 mg | ORAL_TABLET | Freq: Every day | ORAL | Status: DC

## 2016-02-06 MED ORDER — SIMVASTATIN 20 MG PO TABS: 20.0000 mg | ORAL_TABLET | Freq: Every day | ORAL | Status: DC

## 2016-02-06 MED ORDER — AMLODIPINE BESYLATE 10 MG PO TABS: 10.0000 mg | ORAL_TABLET | Freq: Every day | ORAL | Status: DC

## 2016-02-06 MED ORDER — OMEPRAZOLE 20 MG PO CPDR: 20.0000 mg | DELAYED_RELEASE_CAPSULE | Freq: Every day | ORAL | Status: DC

## 2016-03-07 ENCOUNTER — Ambulatory Visit (INDEPENDENT_AMBULATORY_CARE_PROVIDER_SITE_OTHER): Payer: Medicare Other | Admitting: Internal Medicine

## 2016-03-07 ENCOUNTER — Other Ambulatory Visit: Payer: Self-pay | Admitting: Internal Medicine

## 2016-03-07 ENCOUNTER — Encounter: Payer: Self-pay | Admitting: Internal Medicine

## 2016-03-07 VITALS — BP 126/76 | HR 76 | Temp 98.1°F | Resp 14 | Ht 61.0 in | Wt 173.4 lb

## 2016-03-07 DIAGNOSIS — I10 Essential (primary) hypertension: Secondary | ICD-10-CM

## 2016-03-07 DIAGNOSIS — E119 Type 2 diabetes mellitus without complications: Secondary | ICD-10-CM | POA: Diagnosis not present

## 2016-03-07 DIAGNOSIS — K219 Gastro-esophageal reflux disease without esophagitis: Secondary | ICD-10-CM | POA: Diagnosis not present

## 2016-03-07 DIAGNOSIS — F341 Dysthymic disorder: Secondary | ICD-10-CM

## 2016-03-07 DIAGNOSIS — E785 Hyperlipidemia, unspecified: Secondary | ICD-10-CM

## 2016-03-07 LAB — MICROALBUMIN / CREATININE URINE RATIO
CREATININE, U: 139.6 mg/dL
Microalb Creat Ratio: 0.5 mg/g (ref 0.0–30.0)

## 2016-03-07 LAB — AST: AST: 21 U/L (ref 0–37)

## 2016-03-07 LAB — BASIC METABOLIC PANEL
BUN: 13 mg/dL (ref 6–23)
CALCIUM: 9.6 mg/dL (ref 8.4–10.5)
CHLORIDE: 104 meq/L (ref 96–112)
CO2: 28 mEq/L (ref 19–32)
CREATININE: 0.75 mg/dL (ref 0.40–1.20)
GFR: 80.7 mL/min (ref >60.00)
Glucose, Bld: 108 mg/dL — ABNORMAL HIGH (ref 70–99)
Potassium: 4.2 mEq/L (ref 3.5–5.1)
Sodium: 138 mEq/L (ref 135–145)

## 2016-03-07 LAB — LIPID PANEL
CHOL/HDL RATIO: 3
CHOLESTEROL: 162 mg/dL (ref 0–200)
HDL: 54 mg/dL (ref >39.00)
LDL Cholesterol: 78 mg/dL (ref 0–99)
NONHDL: 108.44
TRIGLYCERIDES: 152 mg/dL — AB (ref 0.0–149.0)
VLDL: 30.4 mg/dL (ref 0.0–40.0)

## 2016-03-07 LAB — ALT: ALT: 14 U/L (ref 0–35)

## 2016-03-07 LAB — HEMOGLOBIN A1C: Hgb A1c MFr Bld: 5.7 % (ref 4.6–6.5)

## 2016-03-07 MED ORDER — CITALOPRAM HYDROBROMIDE 20 MG PO TABS: 20.0000 mg | ORAL_TABLET | Freq: Every day | ORAL | 5 refills | Status: DC

## 2016-03-07 MED ORDER — AMLODIPINE BESYLATE 10 MG PO TABS: 10.0000 mg | ORAL_TABLET | Freq: Every day | ORAL | 5 refills | Status: DC

## 2016-03-07 MED ORDER — ALPRAZOLAM 0.25 MG PO TABS: 0.2500 mg | ORAL_TABLET | Freq: Every evening | ORAL | 5 refills | Status: DC | PRN

## 2016-03-07 MED ORDER — SIMVASTATIN 20 MG PO TABS: 20.0000 mg | ORAL_TABLET | Freq: Every day | ORAL | 5 refills | Status: DC

## 2016-03-07 MED ORDER — CELECOXIB 200 MG PO CAPS: 200.0000 mg | ORAL_CAPSULE | Freq: Every day | ORAL | 5 refills | Status: DC | PRN

## 2016-03-07 MED ORDER — OMEPRAZOLE 20 MG PO CPDR: 20.0000 mg | DELAYED_RELEASE_CAPSULE | Freq: Every day | ORAL | 5 refills | Status: DC

## 2016-03-07 NOTE — Patient Instructions (Signed)
GO TO THE LAB : Get the blood work     GO TO THE FRONT DESK Schedule your next appointment for a  complete physical exam in 4 months. Not fasting.   Don't forget to get your flu shot this fall

## 2016-03-07 NOTE — Progress Notes (Signed)
Subjective:    Patient ID: [name], female    DOB: May 11, 1946, 70 y.o.   MRN: [id number]  DOS:  03/07/2016 Type of visit - description : Routine checkup Interval history:  Anxiety: Well-controlled with citalopram and Xanax, ran out of citalopram 5 days ago, needs a refill HTN: Good med compliance, due for labs High cholesterol: On simvastatin, good compliance without apparent side effects. Due for labs GERD: on long-term Prilosec, symptoms controlled as long as she takes her medication.  Review of Systems Denies chest pain or difficulty breathing No nausea, vomiting, diarrhea. Diet is healthy most of the time, she remains active.  Past Medical History:  Diagnosis Date  . Anxiety and depression   . Arthritis   . Coccyx pain    Secondary to scar tissue from old pressure ulcer after back surgery, Dr. Brantley Stage  . Coronary artery disease   . Dizziness    severe: MRI chronic isch. changes and mastoiditis- saw Dr.Mundy(2007)  STATES SHE STILL HAS EPISODES- ESPECIALLY IF SHE GETS UP TOO QUICKLY AFTER LYING DOWN  . Eczema   . GERD (gastroesophageal reflux disease)   . Goiter   . Headache(784.0)   . Hyperlipidemia   . Hypertension   . Pain    PAIN LOWER BACK AND DOWN RIGHT LEG WITH NUMBNESS, TINGLING RT LEG AND FOOT--STENOSIS  . S/P cardiac cath 2009   Revealing nonobstructive CAD w/ tubular, discrete 40% mid lesion in the circumflex; discrete 30% proximal lesion in the LAD; luminal irregularities, 35% mid lesion in RCA; and generic 40% mid lesion in the RCA. Medical treatment recommended.  . S/P cardiac cath 11/24/2014   Revealing nonobstructive CAD w/ tubular, discrete 40% mid lesion in the circumflex; discrete 30% proximal lesion in the LAD; luminal irregularities, 35% mid lesion in RCA; and generic 40% mid lesion in the RCA. Medical treatment recommended.    Past Surgical History:  Procedure Laterality Date  . ABDOMINAL HYSTERECTOMY     oophorectomy L only  .  BUNIONECTOMY  1990s   LEFT  . CHOLECYSTECTOMY    . HAND SURGERY  1990s   R hand x 2, L hand x 1  . LUMBAR LAMINECTOMY/DECOMPRESSION MICRODISCECTOMY N/A 11/13/2012   Procedure: MICRO LUMBAR DECOMPRESSION L4 - L5 AND L2 - L3 2 LEVELS;  Surgeon: Johnn Hai, MD;  Location: [facility];  Service: Orthopedics;  Laterality: N/A;  . SHOULDER SURGERY  2006   left     Social History   Social History  . Marital status: Married    Spouse name: N/A  . Number of children: 4  . Years of education: N/A   Occupational History  . retired  Retired   Social History Main Topics  . Smoking status: Former Research scientist (life sciences)  . Smokeless tobacco: Never Used     Comment: quit aprox 2000 after 30 years, 1 to 1.5 ppd  . Alcohol use No  . Drug use: No  . Sexual activity: Not on file   Other Topics Concern  . Not on file   Social History Narrative   Lives w/ husband , 7 Gk, 40 GGk        Medication List       Accurate as of 03/07/16 11:59 PM. Always use your most recent med list.          ALPRAZolam 0.25 MG tablet Commonly known as:  XANAX Take 1-2 tablets (0.25-0.5 mg total) by mouth at bedtime as needed for sleep.   amLODipine 10  MG tablet Commonly known as:  NORVASC Take 1 tablet (10 mg total) by mouth daily.   aspirin 81 MG tablet Take 81 mg by mouth every morning.   CALCIUM-VITAMIN D PO Take by mouth. Calcium 1 gram, Vitamin D 1000 units   celecoxib 200 MG capsule Commonly known as:  CELEBREX Take 1 capsule (200 mg total) by mouth daily as needed.   citalopram 20 MG tablet Commonly known as:  CELEXA Take 1 tablet (20 mg total) by mouth daily.   omeprazole 20 MG capsule Commonly known as:  PRILOSEC Take 1 capsule (20 mg total) by mouth daily.   simvastatin 20 MG tablet Commonly known as:  ZOCOR Take 1 tablet (20 mg total) by mouth at bedtime.   traMADol 50 MG tablet Commonly known as:  ULTRAM Take 50 mg by mouth 3 (three) times daily as needed for pain.            Objective:   Physical Exam BP 126/76 (BP Location: Left Arm, Patient Position: Sitting, Cuff Size: Normal)   Pulse 76   Temp 98.1 F (36.7 C) (Oral)   Resp 14   Ht 5\' 1"  (1.549 m)   Wt 173 lb 6 oz (78.6 kg)   SpO2 96%   BMI 32.76 kg/m  General:   Well developed, well nourished . NAD.  HEENT:  Normocephalic . Face symmetric, atraumatic Lungs:  CTA B Normal respiratory effort, no intercostal retractions, no accessory muscle use. Heart: RRR,  no murmur.  No pretibial edema bilaterally  Skin: Not pale. Not jaundice Neurologic:  alert & oriented X3.  Speech normal, gait appropriate for age and unassisted Psych--  Cognition and judgment appear intact.  Cooperative with normal attention span and concentration.  Behavior appropriate. No anxious or depressed appearing.      Assessment & Plan:    Assessment> Prediabetes HTN Hyperlipidemia Anxiety depression, insomnia- xanax rx by pcp GERD Goiter MSK:  --Back - coccyxt pain, lumbar surgery 2014 --On prn celebrex --on Tramadol (rx by back surgeon)   CAD: catheterization 2009 and 2016: Non-obstructive CAD, 40% blockages, see report. rx medical treatment Chronic Dizziness Eczema/psoriasis , sees derm  PLAN: Prediabetes: On diet control, check A1c. HTN: Continue amlodipine, check a BMP Anxiety depression insomnia: Refill citalopram, on Xanax. Sx controlled Hyperlipidemia: On simvastatin, check FLP, AST, ALT GERD: Symptoms controlled as long as she takes PPIs daily RTC 4 months, CPX

## 2016-03-07 NOTE — Progress Notes (Signed)
Pre visit review using our clinic review tool, if applicable. No additional management support is needed unless otherwise documented below in the visit note. 

## 2016-03-08 NOTE — Assessment & Plan Note (Signed)
Prediabetes: On diet control, check A1c. HTN: Continue amlodipine, check a BMP Anxiety depression insomnia: Refill citalopram, on Xanax. Sx controlled Hyperlipidemia: On simvastatin, check FLP, AST, ALT GERD: Symptoms controlled as long as she takes PPIs daily RTC 4 months, CPX

## 2016-03-19 DIAGNOSIS — Z01419 Encounter for gynecological examination (general) (routine) without abnormal findings: Secondary | ICD-10-CM | POA: Diagnosis not present

## 2016-03-19 DIAGNOSIS — Z1231 Encounter for screening mammogram for malignant neoplasm of breast: Secondary | ICD-10-CM | POA: Diagnosis not present

## 2016-03-19 DIAGNOSIS — Z6833 Body mass index (BMI) 33.0-33.9, adult: Secondary | ICD-10-CM | POA: Diagnosis not present

## 2016-03-19 DIAGNOSIS — Z124 Encounter for screening for malignant neoplasm of cervix: Secondary | ICD-10-CM | POA: Diagnosis not present

## 2016-03-19 LAB — HM DEXA SCAN

## 2016-08-11 ENCOUNTER — Other Ambulatory Visit: Payer: Self-pay | Admitting: Internal Medicine

## 2016-09-10 ENCOUNTER — Ambulatory Visit (INDEPENDENT_AMBULATORY_CARE_PROVIDER_SITE_OTHER): Payer: Medicare Other | Admitting: Internal Medicine

## 2016-09-10 ENCOUNTER — Encounter: Payer: Self-pay | Admitting: Internal Medicine

## 2016-09-10 VITALS — BP 124/74 | HR 82 | Temp 98.2°F | Resp 14 | Ht 61.0 in | Wt 173.4 lb

## 2016-09-10 DIAGNOSIS — Z0000 Encounter for general adult medical examination without abnormal findings: Secondary | ICD-10-CM | POA: Diagnosis not present

## 2016-09-10 DIAGNOSIS — M159 Polyosteoarthritis, unspecified: Secondary | ICD-10-CM | POA: Diagnosis not present

## 2016-09-10 DIAGNOSIS — E559 Vitamin D deficiency, unspecified: Secondary | ICD-10-CM | POA: Diagnosis not present

## 2016-09-10 DIAGNOSIS — Z1159 Encounter for screening for other viral diseases: Secondary | ICD-10-CM

## 2016-09-10 LAB — CBC WITH DIFFERENTIAL/PLATELET
BASOS PCT: 0.5 % (ref 0.0–3.0)
Basophils Absolute: 0 10*3/uL (ref 0.0–0.1)
EOS PCT: 1.6 % (ref 0.0–5.0)
Eosinophils Absolute: 0.1 10*3/uL (ref 0.0–0.7)
HEMATOCRIT: 42.8 % (ref 36.0–46.0)
HEMOGLOBIN: 14.5 g/dL (ref 12.0–15.0)
LYMPHS PCT: 35.3 % (ref 12.0–46.0)
Lymphs Abs: 2.7 10*3/uL (ref 0.7–4.0)
MCHC: 33.8 g/dL (ref 30.0–36.0)
MCV: 91.7 fl (ref 78.0–100.0)
Monocytes Absolute: 0.6 10*3/uL (ref 0.1–1.0)
Monocytes Relative: 8.3 % (ref 3.0–12.0)
NEUTROS ABS: 4.1 10*3/uL (ref 1.4–7.7)
Neutrophils Relative %: 54.3 % (ref 43.0–77.0)
Platelets: 219 10*3/uL (ref 150.0–400.0)
RBC: 4.67 Mil/uL (ref 3.87–5.11)
RDW: 13.5 % (ref 11.5–15.5)
WBC: 7.5 10*3/uL (ref 4.0–10.5)

## 2016-09-10 LAB — BASIC METABOLIC PANEL
BUN: 12 mg/dL (ref 6–23)
CHLORIDE: 105 meq/L (ref 96–112)
CO2: 26 meq/L (ref 19–32)
Calcium: 9.5 mg/dL (ref 8.4–10.5)
Creatinine, Ser: 0.76 mg/dL (ref 0.40–1.20)
GFR: 79.36 mL/min (ref >60.00)
Glucose, Bld: 112 mg/dL — ABNORMAL HIGH (ref 70–99)
Potassium: 3.6 mEq/L (ref 3.5–5.1)
Sodium: 142 mEq/L (ref 135–145)

## 2016-09-10 LAB — HEPATITIS C ANTIBODY: HCV Ab: NEGATIVE

## 2016-09-10 LAB — TSH: TSH: 2.84 u[IU]/mL (ref 0.35–4.50)

## 2016-09-10 MED ORDER — ALPRAZOLAM 0.25 MG PO TABS: 0.2500 mg | ORAL_TABLET | Freq: Every evening | ORAL | 5 refills | Status: DC | PRN

## 2016-09-10 MED ORDER — CITALOPRAM HYDROBROMIDE 20 MG PO TABS: 20.0000 mg | ORAL_TABLET | Freq: Every day | ORAL | 6 refills | Status: DC

## 2016-09-10 MED ORDER — CELECOXIB 200 MG PO CAPS: 200.0000 mg | ORAL_CAPSULE | Freq: Every day | ORAL | 0 refills | Status: DC | PRN

## 2016-09-10 MED ORDER — AMLODIPINE BESYLATE 10 MG PO TABS: 10.0000 mg | ORAL_TABLET | Freq: Every day | ORAL | 6 refills | Status: DC

## 2016-09-10 MED ORDER — SIMVASTATIN 20 MG PO TABS: 20.0000 mg | ORAL_TABLET | Freq: Every day | ORAL | 6 refills | Status: DC

## 2016-09-10 MED ORDER — OMEPRAZOLE 20 MG PO CPDR: 20.0000 mg | DELAYED_RELEASE_CAPSULE | Freq: Every day | ORAL | 6 refills | Status: DC

## 2016-09-10 NOTE — Progress Notes (Signed)
Subjective:    Patient ID: [name], female    DOB: [birth year], 71 y.o.   MRN: [id number]  DOS:  09/10/2016 Type of visit - description : cpx Interval history: No major concerns   Review of Systems   Had the flu last week, did not take Tamiflu but feels better, no further fever chills. No nausea or vomiting. Still has some cough.  Constitutional: No fever. No chills. No unexplained wt changes. No unusual sweats  HEENT: No dental problems, no ear discharge, no facial swelling, no voice changes. No eye discharge, no eye  redness , no  intolerance to light   Respiratory: No wheezing , no  difficulty breathing.    Cardiovascular: No CP, no leg swelling , no  Palpitations  GI: no nausea, no vomiting, no diarrhea , no  abdominal pain.  No blood in the stools. No dysphagia, no odynophagia    Endocrine: No polyphagia, no polyuria , no polydipsia  GU: No dysuria, gross hematuria, difficulty urinating. No urinary urgency, no frequency.  Musculoskeletal: No joint swellings or unusual aches or pains  Skin: No change in the color of the skin, palor , no  Rash  Allergic, immunologic: No environmental allergies , no  food allergies  Neurological: No dizziness no  syncope. No headaches. No diplopia, no slurred, no slurred speech, no motor deficits, no facial  Numbness  Hematological: No enlarged lymph nodes, no easy bruising , no unusual bleedings  Psychiatry: No suicidal ideas, no hallucinations, no beavior problems, no confusion.  No unusual/severe anxiety, no depression   Past Medical History:  Diagnosis Date  . Anxiety and depression   . Arthritis   . Coccyx pain    Secondary to scar tissue from old pressure ulcer after back surgery, Dr. Brantley Stage  . Coronary artery disease   . Dizziness    severe: MRI chronic isch. changes and mastoiditis- saw Dr.Mundy(2007)  STATES SHE STILL HAS EPISODES- ESPECIALLY IF SHE GETS UP TOO QUICKLY AFTER LYING DOWN  . Eczema   . GERD  (gastroesophageal reflux disease)   . Goiter   . Headache(784.0)   . Hyperlipidemia   . Hypertension   . Pain    PAIN LOWER BACK AND DOWN RIGHT LEG WITH NUMBNESS, TINGLING RT LEG AND FOOT--STENOSIS  . S/P cardiac cath 2009   Revealing nonobstructive CAD w/ tubular, discrete 40% mid lesion in the circumflex; discrete 30% proximal lesion in the LAD; luminal irregularities, 35% mid lesion in RCA; and generic 40% mid lesion in the RCA. Medical treatment recommended.  . S/P cardiac cath 11/24/2014   Revealing nonobstructive CAD w/ tubular, discrete 40% mid lesion in the circumflex; discrete 30% proximal lesion in the LAD; luminal irregularities, 35% mid lesion in RCA; and generic 40% mid lesion in the RCA. Medical treatment recommended.    Past Surgical History:  Procedure Laterality Date  . ABDOMINAL HYSTERECTOMY     oophorectomy L only  . BUNIONECTOMY  1990s   LEFT  . CHOLECYSTECTOMY    . HAND SURGERY  1990s   R hand x 2, L hand x 1  . LUMBAR LAMINECTOMY/DECOMPRESSION MICRODISCECTOMY N/A 11/13/2012   Procedure: MICRO LUMBAR DECOMPRESSION L4 - L5 AND L2 - L3 2 LEVELS;  Surgeon: Johnn Hai, MD;  Location: [facility];  Service: Orthopedics;  Laterality: N/A;  . SHOULDER SURGERY  2006   left    Family History  Problem Relation Age of Onset  . Breast cancer Other     2  cousins  . Throat cancer Other     cousin  . Heart attack Brother     3 brother   . Lung cancer Mother     cousin  . Lung cancer Brother   . Colon cancer Neg Hx   . Diabetes Neg Hx      Social History   Social History  . Marital status: Married    Spouse name: N/A  . Number of children: 4  . Years of education: N/A   Occupational History  . retired  Retired   Social History Main Topics  . Smoking status: Former Research scientist (life sciences)  . Smokeless tobacco: Never Used     Comment: quit aprox 2000 after 30 years, 1 to 1.5 ppd  . Alcohol use No  . Drug use: No  . Sexual activity: Not on file   Other Topics Concern  .  Not on file   Social History Narrative   Lives w/ husband and her son   2 Gk, 64 GGk     Family History  Problem Relation Age of Onset  . Breast cancer Other     2 cousins  . Throat cancer Other     cousin  . Heart attack Brother     3 brother   . Lung cancer Mother     cousin  . Lung cancer Brother   . Colon cancer Neg Hx   . Diabetes Neg Hx      Allergies as of 09/10/2016      Reactions   Hydrocodone    Whelps all over   Sertraline Hcl Other (See Comments)    muscle aches, diarrhea, nausea   Codeine Other (See Comments)     chest and stomach pain, severe abd cramping      Medication List       Accurate as of 09/10/16 11:59 PM. Always use your most recent med list.          ALPRAZolam 0.25 MG tablet Commonly known as:  XANAX Take 1-2 tablets (0.25-0.5 mg total) by mouth at bedtime as needed for sleep.   amLODipine 10 MG tablet Commonly known as:  NORVASC Take 1 tablet (10 mg total) by mouth daily.   aspirin 81 MG tablet Take 81 mg by mouth every morning.   CALCIUM-VITAMIN D PO Take by mouth. Calcium 1 gram, Vitamin D 1000 units   celecoxib 200 MG capsule Commonly known as:  CELEBREX Take 1 capsule (200 mg total) by mouth daily as needed.   citalopram 20 MG tablet Commonly known as:  CELEXA Take 1 tablet (20 mg total) by mouth daily.   omeprazole 20 MG capsule Commonly known as:  PRILOSEC Take 1 capsule (20 mg total) by mouth daily.   simvastatin 20 MG tablet Commonly known as:  ZOCOR Take 1 tablet (20 mg total) by mouth at bedtime.   traMADol 50 MG tablet Commonly known as:  ULTRAM Take 50 mg by mouth 3 (three) times daily as needed for pain.          Objective:   Physical Exam BP 124/74 (BP Location: Left Arm, Patient Position: Sitting, Cuff Size: Normal)   Pulse 82   Temp 98.2 F (36.8 C) (Oral)   Resp 14   Ht 5\' 1"  (1.549 m)   Wt 173 lb 6 oz (78.6 kg)   SpO2 98%   BMI 32.76 kg/m   General:   Well developed, well nourished .  NAD.  Neck: No  thyromegaly  HEENT:  Normocephalic . Face symmetric, atraumatic Lungs:  CTA B Normal respiratory effort, no intercostal retractions, no accessory muscle use. Heart: RRR,  no murmur.  No pretibial edema bilaterally  Abdomen:  Not distended, soft, non-tender. No rebound or rigidity.   Skin: Exposed areas without rash. Not pale. Not jaundice Neurologic:  alert & oriented X3.  Speech normal, gait appropriate for age and unassisted Strength symmetric and appropriate for age.  Psych: Cognition and judgment appear intact.  Cooperative with normal attention span and concentration.  Behavior appropriate. No anxious or depressed appearing.    Assessment & Plan:   Assessment> Prediabetes HTN Hyperlipidemia Anxiety depression, insomnia- xanax rx by pcp GERD CAD: catheterization 2009 and 2016: Non-obstructive CAD, 40% blockages, see report. rx medical treatment Goiter: Last ultrasound 12-2014, nodules increase in size MSK:  --Back - coccyxt pain, lumbar surgery 2014, chronic residual pain --On prn celebrex --on Tramadol (rx by back surgeon)   Vit d def Chronic Dizziness Eczema/psoriasis , sees derm  PLAN: Prediabetes: A1c is stable for years. Encourage good diet and exercise HTN: Seems well-controlled with amlodipine. Check a BMP Hyperlipidemia: Well-controlled on simvastatin Anxiety depression and insomnia: On Xanax prn, contract and UDS today. Chronic back pain: On Ultram as needed, take it rarely, ~ once a month . Take Celebrex 2 or 3 times a month. Goiter: Last ultrasound satisfactory, exam is negative, reassess the need for ultrasound next year Vitamin D deficiency: Last level was a slightly high, recheck RTC 6 months , rec  a Medicare wellness

## 2016-09-10 NOTE — Progress Notes (Signed)
Pre visit review using our clinic review tool, if applicable. No additional management support is needed unless otherwise documented below in the visit note. 

## 2016-09-10 NOTE — Assessment & Plan Note (Addendum)
Prediabetes: A1c is stable for years. Encourage good diet and exercise HTN: Seems well-controlled with amlodipine. Check a BMP Hyperlipidemia: Well-controlled on simvastatin Anxiety depression and insomnia: On Xanax prn, contract and UDS today. Chronic back pain: On Ultram as needed, take it rarely, ~ once a month . Take Celebrex 2 or 3 times a month. Goiter: Last ultrasound satisfactory, exam is negative, reassess the need for ultrasound next year Vitamin D deficiency: Last level was a slightly high, recheck   RTC 6 months , rec  a Medicare wellness

## 2016-09-10 NOTE — Patient Instructions (Signed)
GO TO THE LAB : Get the blood work  and provide a sample of urine for a UDS   GO TO THE FRONT DESK Schedule your next appointment for a  checkup in 6 months     Schedule Medicare wellness with one of our nurses at your convenience

## 2016-09-10 NOTE — Assessment & Plan Note (Addendum)
Td   2012 ; pneumonia shot 2010; prevnar--2015 ; shingles shot 2013 No flu shot this season , just recovering from the flu  Sees   had a PAP 02-2014, saw gyn ~ 12-2015, no PAP DEXA- MMG ~ 12-2015 @ gyn. All ok  Cscope 2004, + polyps adenomatous colonoscopy  07-2009, no polyps Over due for a colonoscopy, declined to proceed ("husband had to have 2 surgeries after a colonoscopy"), benefits discussed, still declined   Labs: BMP, CBC, TSH, vitamin D, hep C Diet and exercise discussed

## 2016-09-15 LAB — VITAMIN D 1,25 DIHYDROXY
VITAMIN D2 1, 25 (OH): 55 pg/mL
Vitamin D 1, 25 (OH)2 Total: 113 pg/mL — ABNORMAL HIGH (ref 18–72)
Vitamin D3 1, 25 (OH)2: 58 pg/mL

## 2017-03-13 ENCOUNTER — Encounter: Payer: Self-pay | Admitting: Internal Medicine

## 2017-03-13 ENCOUNTER — Ambulatory Visit (INDEPENDENT_AMBULATORY_CARE_PROVIDER_SITE_OTHER): Payer: Medicare Other | Admitting: Internal Medicine

## 2017-03-13 VITALS — BP 124/68 | HR 84 | Temp 97.9°F | Resp 14 | Ht 61.0 in | Wt 176.0 lb

## 2017-03-13 DIAGNOSIS — K219 Gastro-esophageal reflux disease without esophagitis: Secondary | ICD-10-CM

## 2017-03-13 DIAGNOSIS — T452X1D Poisoning by vitamins, accidental (unintentional), subsequent encounter: Secondary | ICD-10-CM | POA: Diagnosis not present

## 2017-03-13 DIAGNOSIS — R739 Hyperglycemia, unspecified: Secondary | ICD-10-CM | POA: Diagnosis not present

## 2017-03-13 DIAGNOSIS — F341 Dysthymic disorder: Secondary | ICD-10-CM | POA: Diagnosis not present

## 2017-03-13 DIAGNOSIS — E785 Hyperlipidemia, unspecified: Secondary | ICD-10-CM

## 2017-03-13 LAB — LIPID PANEL
CHOL/HDL RATIO: 3
Cholesterol: 156 mg/dL (ref 0–200)
HDL: 46.4 mg/dL (ref >39.00)
LDL CALC: 77 mg/dL (ref 0–99)
NONHDL: 109.47
TRIGLYCERIDES: 164 mg/dL — AB (ref 0.0–149.0)
VLDL: 32.8 mg/dL (ref 0.0–40.0)

## 2017-03-13 LAB — HEMOGLOBIN A1C: Hgb A1c MFr Bld: 5.7 % (ref 4.6–6.5)

## 2017-03-13 MED ORDER — RANITIDINE HCL 300 MG PO TABS: 300.0000 mg | ORAL_TABLET | Freq: Every day | ORAL | 3 refills | Status: DC

## 2017-03-13 MED ORDER — CITALOPRAM HYDROBROMIDE 20 MG PO TABS: 30.0000 mg | ORAL_TABLET | Freq: Every day | ORAL | 5 refills | Status: DC

## 2017-03-13 NOTE — Assessment & Plan Note (Signed)
Prediabetes: Diet control, check A1c Hyperlipidemia: Diet control, check a FLP Elevated vitamin D: Patient stopped supplements, recheck today. Anxiety depression insomnia: Insomnia chronic but worse for the last year. PHQ 9: scored 10, mild-to-moderate depression. We agreed to increase citalopram to 30 mg daily to improve depression and anxiety control. Encouraged her to take Xanax more frequent, daily if needed. Sleep tips provided. GERD: On chronic omeprazole, it may interact with high doses of citalopram, will switch to ranitidine. RTC 2-3 months

## 2017-03-13 NOTE — Progress Notes (Signed)
Subjective:    Patient ID: [name], female    DOB: July 09, 1946, 71 y.o.   MRN: [id number]  DOS:  03/13/2017 Type of visit - description : f/u Interval history: Chronic insomnia, worse for the last year. Typically she goes to bed and "her mind cannot stop thinking". She finally falls asleep somewhere between 2:30 to 4 AM. Sometimes takes Xanax after few hours of not able to sleep. Does not take xanax every day. Anxiety depression: On citalopram, still has some anxiety and depressive feelings. HTN: Good compliance of medication, ambulatory BPs normal when checked.  Review of Systems Denies suicidal ideas. Most of her worries are related to her husband health, he has PTSD and sometimes is difficult to live with. Also, he was diagnosed with liver cancer 4 weeks ago.   Past Medical History:  Diagnosis Date  . Anxiety and depression   . Arthritis   . Coccyx pain    Secondary to scar tissue from old pressure ulcer after back surgery, Dr. Brantley Stage  . Coronary artery disease   . Dizziness    severe: MRI chronic isch. changes and mastoiditis- saw Dr.Mundy(2007)  STATES SHE STILL HAS EPISODES- ESPECIALLY IF SHE GETS UP TOO QUICKLY AFTER LYING DOWN  . Eczema   . GERD (gastroesophageal reflux disease)   . Goiter   . Headache(784.0)   . Hyperlipidemia   . Hypertension   . Pain    PAIN LOWER BACK AND DOWN RIGHT LEG WITH NUMBNESS, TINGLING RT LEG AND FOOT--STENOSIS  . S/P cardiac cath 2009   Revealing nonobstructive CAD w/ tubular, discrete 40% mid lesion in the circumflex; discrete 30% proximal lesion in the LAD; luminal irregularities, 35% mid lesion in RCA; and generic 40% mid lesion in the RCA. Medical treatment recommended.  . S/P cardiac cath 11/24/2014   Revealing nonobstructive CAD w/ tubular, discrete 40% mid lesion in the circumflex; discrete 30% proximal lesion in the LAD; luminal irregularities, 35% mid lesion in RCA; and generic 40% mid lesion in the RCA. Medical treatment  recommended.    Past Surgical History:  Procedure Laterality Date  . ABDOMINAL HYSTERECTOMY     oophorectomy L only  . BUNIONECTOMY  1990s   LEFT  . CHOLECYSTECTOMY    . HAND SURGERY  1990s   R hand x 2, L hand x 1  . LUMBAR LAMINECTOMY/DECOMPRESSION MICRODISCECTOMY N/A 11/13/2012   Procedure: MICRO LUMBAR DECOMPRESSION L4 - L5 AND L2 - L3 2 LEVELS;  Surgeon: Johnn Hai, MD;  Location: [facility];  Service: Orthopedics;  Laterality: N/A;  . SHOULDER SURGERY  2006   left     Social History   Social History  . Marital status: Married    Spouse name: N/A  . Number of children: 4  . Years of education: N/A   Occupational History  . retired  Retired   Social History Main Topics  . Smoking status: Former Research scientist (life sciences)  . Smokeless tobacco: Never Used     Comment: quit aprox 2000 after 30 years, 1 to 1.5 ppd  . Alcohol use No  . Drug use: No  . Sexual activity: Not on file   Other Topics Concern  . Not on file   Social History Narrative   Lives w/ husband and her son   53 Gk, 4 GGk      Allergies as of 03/13/2017      Reactions   Hydrocodone    Whelps all over   Sertraline Hcl Other (See Comments)  muscle aches, diarrhea, nausea   Codeine Other (See Comments)     chest and stomach pain, severe abd cramping      Medication List       Accurate as of 03/13/17  4:02 PM. Always use your most recent med list.          ALPRAZolam 0.25 MG tablet Commonly known as:  XANAX Take 1-2 tablets (0.25-0.5 mg total) by mouth at bedtime as needed for sleep.   amLODipine 10 MG tablet Commonly known as:  NORVASC Take 1 tablet (10 mg total) by mouth daily.   aspirin 81 MG tablet Take 81 mg by mouth every morning.   celecoxib 200 MG capsule Commonly known as:  CELEBREX Take 1 capsule (200 mg total) by mouth daily as needed.   citalopram 20 MG tablet Commonly known as:  CELEXA Take 1.5 tablets (30 mg total) by mouth daily.   ranitidine 300 MG tablet Commonly known as:   ZANTAC Take 1 tablet (300 mg total) by mouth at bedtime.   simvastatin 20 MG tablet Commonly known as:  ZOCOR Take 1 tablet (20 mg total) by mouth at bedtime.   traMADol 50 MG tablet Commonly known as:  ULTRAM Take 50 mg by mouth 3 (three) times daily as needed for pain.            Discharge Care Instructions        Start     Ordered   03/13/17 0000  Vitamin D 1,25 dihydroxy     03/13/17 1049   03/13/17 0000  Lipid panel     03/13/17 1049   03/13/17 0000  Hemoglobin A1c     03/13/17 1049   03/13/17 0000  citalopram (CELEXA) 20 MG tablet  Daily     03/13/17 1051   03/13/17 0000  ranitidine (ZANTAC) 300 MG tablet  Daily at bedtime     03/13/17 1051         Objective:   Physical Exam BP 124/68 (BP Location: Left Arm, Patient Position: Sitting, Cuff Size: Small)   Pulse 84   Temp 97.9 F (36.6 C) (Oral)   Resp 14   Ht 5\' 1"  (1.549 m)   Wt 176 lb (79.8 kg)   SpO2 94%   BMI 33.25 kg/m  General:   Well developed, well nourished . NAD.  HEENT:  Normocephalic . Face symmetric, atraumatic Lungs:  CTA B Normal respiratory effort, no intercostal retractions, no accessory muscle use. Heart: RRR,  no murmur.  No pretibial edema bilaterally  Skin: Not pale. Not jaundice Neurologic:  alert & oriented X3.  Speech normal, gait appropriate for age and unassisted Psych--  Cognition and judgment appear intact.  Cooperative with normal attention span and concentration.  Behavior appropriate. No anxious or depressed appearing. Seems in good spirits.     Assessment & Plan:  Assessment> Prediabetes HTN Hyperlipidemia Anxiety depression, insomnia- xanax rx by pcp GERD CAD: catheterization 2009 and 2016: Non-obstructive CAD, 40% blockages, see report. rx medical treatment Goiter: Last ultrasound 12-2014, nodules increase in size MSK:  --Back - coccyxt pain, lumbar surgery 2014, chronic residual pain --On prn celebrex --on Tramadol (rx by back surgeon)   Vit d  def Chronic Dizziness Eczema/psoriasis , sees derm  PLAN: Prediabetes: Diet control, check A1c Hyperlipidemia: Diet control, check a FLP Elevated vitamin D: Patient stopped supplements, recheck today. Anxiety depression insomnia: Insomnia chronic but worse for the last year. PHQ 9: scored 10, mild-to-moderate depression. We agreed to increase citalopram  to 30 mg daily to improve depression and anxiety control. Encouraged her to take Xanax more frequent, daily if needed. Sleep tips provided. GERD: On chronic omeprazole, it may interact with high doses of citalopram, will switch to ranitidine. RTC 2-3 months

## 2017-03-13 NOTE — Patient Instructions (Addendum)
GO TO THE LAB : Get the blood work  . We will also need a urine sample   GO TO THE FRONT DESK Schedule your next appointment for a  checkup in 2 or 3 months  Increase citalopram to 30 mg every day  Take Xanax as needed   Please stop omeprazole, it may interact with citalopram.  For acid reflux, start ranitidine at bedtime.    HEALTHY SLEEP Sleep hygiene: Basic rules for a good night's sleep  Sleep only as much as you need to feel rested and then get out of bed  Keep a regular sleep schedule  Avoid forcing sleep  Exercise regularly for at least 20 minutes, preferably 4 to 5 hours before bedtime  Avoid caffeinated beverages after lunch  Avoid alcohol near bedtime: no "night cap"  Avoid smoking, especially in the evening  Do not go to bed hungry  Adjust bedroom environment  Avoid prolonged use of light-emitting screens before bedtime   Deal with your worries before bedtime

## 2017-03-13 NOTE — Progress Notes (Signed)
Pre visit review using our clinic review tool, if applicable. No additional management support is needed unless otherwise documented below in the visit note. 

## 2017-03-15 ENCOUNTER — Other Ambulatory Visit: Payer: Self-pay | Admitting: Internal Medicine

## 2017-03-15 NOTE — Telephone Encounter (Signed)
Rx faxed to [facility] pharmacy.  

## 2017-03-15 NOTE — Telephone Encounter (Signed)
OK 

## 2017-03-15 NOTE — Telephone Encounter (Signed)
Rx printed, awaiting DO signature.  

## 2017-03-15 NOTE — Telephone Encounter (Signed)
Pt is requesting refill on alprazolam 0.25mg . Paz Pt.  Last OV: 03/13/2017 Last Fill: 09/10/2016 #60 and 5RF UDS: 05/31/2015 Low risk   Please advise in PCP absence.

## 2017-03-16 LAB — VITAMIN D 1,25 DIHYDROXY
VITAMIN D2 1, 25 (OH): 28 pg/mL
Vitamin D 1, 25 (OH)2 Total: 91 pg/mL — ABNORMAL HIGH (ref 18–72)
Vitamin D3 1, 25 (OH)2: 63 pg/mL

## 2017-03-21 DIAGNOSIS — H524 Presbyopia: Secondary | ICD-10-CM | POA: Diagnosis not present

## 2017-03-21 DIAGNOSIS — H5203 Hypermetropia, bilateral: Secondary | ICD-10-CM | POA: Diagnosis not present

## 2017-03-21 DIAGNOSIS — H2513 Age-related nuclear cataract, bilateral: Secondary | ICD-10-CM | POA: Diagnosis not present

## 2017-03-21 DIAGNOSIS — H52223 Regular astigmatism, bilateral: Secondary | ICD-10-CM | POA: Diagnosis not present

## 2017-04-24 ENCOUNTER — Telehealth: Payer: Self-pay | Admitting: *Deleted

## 2017-04-24 NOTE — Telephone Encounter (Signed)
Received Provider Query from [facility] for Medical Record Clarification on Depression for Coding purposes, OV note attached; forwarded to provider/SLS  10/03

## 2017-04-25 NOTE — Telephone Encounter (Signed)
[facility] medical record clarification request signed and faxed to [phone].

## 2017-05-14 ENCOUNTER — Telehealth: Payer: Self-pay | Admitting: *Deleted

## 2017-05-14 NOTE — Telephone Encounter (Signed)
Received Provider Query from [employer] for Medical Record Clarification on Depression for Coding purposes, OV note attached; forwarded to provider/SLS 10/23   

## 2017-05-15 ENCOUNTER — Other Ambulatory Visit: Payer: Self-pay | Admitting: Internal Medicine

## 2017-05-20 NOTE — Telephone Encounter (Signed)
Received request again, form signed for 2nd time and faxed to [facility] at [phone].

## 2017-05-23 ENCOUNTER — Encounter: Payer: Self-pay | Admitting: Internal Medicine

## 2017-05-23 ENCOUNTER — Ambulatory Visit (INDEPENDENT_AMBULATORY_CARE_PROVIDER_SITE_OTHER): Payer: Medicare Other | Admitting: Internal Medicine

## 2017-05-23 VITALS — BP 118/68 | HR 75 | Temp 97.4°F | Resp 14 | Ht 61.0 in | Wt 177.5 lb

## 2017-05-23 DIAGNOSIS — Z23 Encounter for immunization: Secondary | ICD-10-CM | POA: Diagnosis not present

## 2017-05-23 DIAGNOSIS — F5101 Primary insomnia: Secondary | ICD-10-CM | POA: Diagnosis not present

## 2017-05-23 DIAGNOSIS — F329 Major depressive disorder, single episode, unspecified: Secondary | ICD-10-CM | POA: Diagnosis not present

## 2017-05-23 DIAGNOSIS — M25511 Pain in right shoulder: Secondary | ICD-10-CM | POA: Diagnosis not present

## 2017-05-23 DIAGNOSIS — F419 Anxiety disorder, unspecified: Secondary | ICD-10-CM | POA: Diagnosis not present

## 2017-05-23 MED ORDER — TRIAZOLAM 0.25 MG PO TABS: 0.2500 mg | ORAL_TABLET | Freq: Every evening | ORAL | 0 refills | Status: DC | PRN

## 2017-05-23 MED ORDER — CITALOPRAM HYDROBROMIDE 40 MG PO TABS: 40.0000 mg | ORAL_TABLET | Freq: Every day | ORAL | 6 refills | Status: DC

## 2017-05-23 NOTE — Progress Notes (Signed)
Pre visit review using our clinic review tool, if applicable. No additional management support is needed unless otherwise documented below in the visit note. 
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
## 2017-05-23 NOTE — Patient Instructions (Signed)
   GO TO THE FRONT DESK Schedule your next appointment for a checkup in 2 months  Stop Xanax  Start HALCION: 1 tablet at bedtime to help you with a sleep.  Okay to take 2 tablets at bedtime if needed.  Increase citalopram to 40 mg a day.

## 2017-05-24 DIAGNOSIS — G4700 Insomnia, unspecified: Secondary | ICD-10-CM | POA: Insufficient documentation
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
## 2017-05-27 ENCOUNTER — Telehealth: Payer: Self-pay

## 2017-05-27 DIAGNOSIS — Z01419 Encounter for gynecological examination (general) (routine) without abnormal findings: Secondary | ICD-10-CM | POA: Diagnosis not present

## 2017-05-27 DIAGNOSIS — Z1231 Encounter for screening mammogram for malignant neoplasm of breast: Secondary | ICD-10-CM | POA: Diagnosis not present

## 2017-05-27 NOTE — Telephone Encounter (Signed)
PA initiated via Covermymeds; KEY: [id number]. Awaiting determination.

## 2017-05-28 NOTE — Telephone Encounter (Signed)
Preferred alternatives: Belsomra, Rozerem, Temazepam?

## 2017-05-29 ENCOUNTER — Telehealth: Payer: Self-pay

## 2017-05-29 MED ORDER — TEMAZEPAM 7.5 MG PO CAPS
7.5000 mg | ORAL_CAPSULE | Freq: Every evening | ORAL | 0 refills | Status: DC | PRN
Start: 2017-05-29 — End: 2017-05-29

## 2017-05-29 MED ORDER — SUVOREXANT 10 MG PO TABS: 1.0000 | ORAL_TABLET | Freq: Every evening | ORAL | 0 refills | Status: DC | PRN

## 2017-05-29 NOTE — Telephone Encounter (Signed)
Rx faxed to [facility]. 

## 2017-05-29 NOTE — Telephone Encounter (Signed)
If the patient is agreeable, okay to try temazepam 7.5 mg.  1 p.o. nightly. #30, no refills. It would be okay to take two tabs but only if needed.  Let me know.

## 2017-05-29 NOTE — Telephone Encounter (Signed)
Spoke w/ Pt, informed of insurance decision not to cover triazolam (Halcion). Informed that per PCP could try temazepam 7.5mg . Pt agreed. Rx printed, awaiting MD signature.

## 2017-05-29 NOTE — Telephone Encounter (Signed)
PA denied. Preferred alternatives:   A. Belsomra B. Rozerem C. Temazepam 15mg  and 30mg  capsules  Please advise.

## 2017-05-29 NOTE — Telephone Encounter (Signed)
PA initiated via Covermymeds; awaiting determination.

## 2017-05-29 NOTE — Telephone Encounter (Signed)
Belsomra faxed to [facility].

## 2017-05-29 NOTE — Telephone Encounter (Signed)
Okay, per insurance constraints will do Belsomra 10 mg 1 p.o. nightly as needed.  Please fax a prescription.  Epic system did not throw any warnings.

## 2017-06-04 DIAGNOSIS — M7541 Impingement syndrome of right shoulder: Secondary | ICD-10-CM | POA: Diagnosis not present

## 2017-06-06 ENCOUNTER — Other Ambulatory Visit: Payer: Self-pay

## 2017-06-06 ENCOUNTER — Telehealth: Payer: Self-pay | Admitting: Internal Medicine

## 2017-06-06 MED ORDER — RANITIDINE HCL 300 MG PO TABS: 300.0000 mg | ORAL_TABLET | Freq: Every day | ORAL | 3 refills | Status: DC

## 2017-06-06 MED ORDER — CITALOPRAM HYDROBROMIDE 40 MG PO TABS: 40.0000 mg | ORAL_TABLET | Freq: Every day | ORAL | 6 refills | Status: DC

## 2017-06-06 NOTE — Telephone Encounter (Signed)
Pt called in to request a call back. Pt says that she would like to discuss her medications.    Please advise    CB: [phone]

## 2017-06-06 NOTE — Telephone Encounter (Signed)
Spoke w/ Pt, she had questions regarding ranitidine and omeprazole, informed she was supposed to d/c omeprazole and start ranitidine. Pt requested I let [facility] know of change as they are still trying to fill omeprazole 20mg . Rx for ranitidine 300mg  sent to [facility] w/ note informing to d/c scripts on file for omeprazole 20mg . Pt verbalized understanding.

## 2017-06-06 NOTE — Progress Notes (Signed)
Spoke w/ Pt, she had questions regarding ranitidine 300mg  and omeprazole 20mg . Informed she was supposed to d/c omeprazole 20mg  and start ranitidine (Zantac) 300mg  qhs, Pt verbalized understanding. She requested I call [facility] to update meds. Rx's sent to [facility] them to d/c scripts on file for omeprazole 20mg .

## 2017-06-17 ENCOUNTER — Telehealth: Payer: Self-pay | Admitting: Internal Medicine

## 2017-06-17 NOTE — Telephone Encounter (Signed)
[facility] called and verified that prescription refills were available. Refills will be prepared for pt to pick up. Notified pt that she would be able to pick up prescriptions from [facility], which is now [facility].

## 2017-06-17 NOTE — Telephone Encounter (Signed)
Copied from [place]. Topic: Quick Communication - See Telephone Encounter >> Jun 17, 2017  9:57 AM Aurelio Brash B wrote: CRM for notification. See Telephone encounter for:  Refill Simvastatin and amlodipine 06/17/17. [facility]

## 2017-06-17 NOTE — Telephone Encounter (Signed)
Pt contacted and states she had reached out to [facility] and was told that she did not have any refills available. Original prescription show 3 refills available, will contact pharmacy to verify.

## 2017-07-05 ENCOUNTER — Other Ambulatory Visit: Payer: Self-pay | Admitting: Internal Medicine

## 2017-07-24 ENCOUNTER — Ambulatory Visit (INDEPENDENT_AMBULATORY_CARE_PROVIDER_SITE_OTHER): Payer: Medicare Other | Admitting: Internal Medicine

## 2017-07-24 ENCOUNTER — Encounter: Payer: Self-pay | Admitting: Internal Medicine

## 2017-07-24 VITALS — BP 118/78 | HR 74 | Temp 97.4°F | Resp 14 | Ht 61.0 in | Wt 176.1 lb

## 2017-07-24 DIAGNOSIS — I10 Essential (primary) hypertension: Secondary | ICD-10-CM | POA: Diagnosis not present

## 2017-07-24 DIAGNOSIS — F329 Major depressive disorder, single episode, unspecified: Secondary | ICD-10-CM | POA: Diagnosis not present

## 2017-07-24 DIAGNOSIS — F419 Anxiety disorder, unspecified: Secondary | ICD-10-CM | POA: Diagnosis not present

## 2017-07-24 DIAGNOSIS — F5101 Primary insomnia: Secondary | ICD-10-CM

## 2017-07-24 MED ORDER — CITALOPRAM HYDROBROMIDE 40 MG PO TABS: 40.0000 mg | ORAL_TABLET | Freq: Every day | ORAL | 6 refills | Status: DC

## 2017-07-24 MED ORDER — AMLODIPINE BESYLATE 10 MG PO TABS: 10.0000 mg | ORAL_TABLET | Freq: Every day | ORAL | 6 refills | Status: DC

## 2017-07-24 NOTE — Progress Notes (Signed)
Pre visit review using our clinic review tool, if applicable. No additional management support is needed unless otherwise documented below in the visit note. 

## 2017-07-24 NOTE — Assessment & Plan Note (Signed)
Anxiety depression insomnia: Since the last office visit,   citalopram was increased to 40 mg, overall feels better.  No suicidal ideas.  Will refill her medications. We were unable to get Halcion approved by her insurance,  tried Belsomra, only one time, had  s/e and decided not to take it however as long as she takes her citalopram she is sleeping better.  No change. Unable to see a Social worker . Reports has no time for counseling HTN: Needs a RF on amlodipine. R ear pain: per pt. Resolved, exam normal.  Observation RTC 3 months

## 2017-07-24 NOTE — Patient Instructions (Signed)
  GO TO THE FRONT DESK Schedule your next appointment for a  Physical exam in 3 months   

## 2017-07-24 NOTE — Progress Notes (Signed)
Subjective:    Patient ID: [name], female    DOB: 07-Feb-1946, 72 y.o.   MRN: [id number]  DOS:  07/24/2017 Type of visit - description : f/u Interval history: Since last visit she increased SSRIs, feels better Unable to get a sleep medicine but doing ok at this point Still stressed regards husband's health/behavior, thinks he has early dementia  Also had R ear pain, overall  feeling better    Review of Systems No suicidal ideas No ear d/c or bleed   Past Medical History:  Diagnosis Date  . Anxiety and depression   . Arthritis   . Coccyx pain    Secondary to scar tissue from old pressure ulcer after back surgery, Dr. Brantley Stage  . Coronary artery disease   . Dizziness    severe: MRI chronic isch. changes and mastoiditis- saw Dr.Mundy(2007)  STATES SHE STILL HAS EPISODES- ESPECIALLY IF SHE GETS UP TOO QUICKLY AFTER LYING DOWN  . Eczema   . GERD (gastroesophageal reflux disease)   . Goiter   . Headache(784.0)   . Hyperlipidemia   . Hypertension   . Pain    PAIN LOWER BACK AND DOWN RIGHT LEG WITH NUMBNESS, TINGLING RT LEG AND FOOT--STENOSIS  . S/P cardiac cath 2009   Revealing nonobstructive CAD w/ tubular, discrete 40% mid lesion in the circumflex; discrete 30% proximal lesion in the LAD; luminal irregularities, 35% mid lesion in RCA; and generic 40% mid lesion in the RCA. Medical treatment recommended.  . S/P cardiac cath 11/24/2014   Revealing nonobstructive CAD w/ tubular, discrete 40% mid lesion in the circumflex; discrete 30% proximal lesion in the LAD; luminal irregularities, 35% mid lesion in RCA; and generic 40% mid lesion in the RCA. Medical treatment recommended.    Past Surgical History:  Procedure Laterality Date  . ABDOMINAL HYSTERECTOMY     oophorectomy L only  . BUNIONECTOMY  1990s   LEFT  . CHOLECYSTECTOMY    . HAND SURGERY  1990s   R hand x 2, L hand x 1  . LUMBAR LAMINECTOMY/DECOMPRESSION MICRODISCECTOMY N/A 11/13/2012   Procedure: MICRO LUMBAR  DECOMPRESSION L4 - L5 AND L2 - L3 2 LEVELS;  Surgeon: Johnn Hai, MD;  Location: [facility];  Service: Orthopedics;  Laterality: N/A;  . SHOULDER SURGERY  2006   left     Social History   Socioeconomic History  . Marital status: Married    Spouse name: Not on file  . Number of children: 4  . Years of education: Not on file  . Highest education level: Not on file  Social Needs  . Financial resource strain: Not on file  . Food insecurity - worry: Not on file  . Food insecurity - inability: Not on file  . Transportation needs - medical: Not on file  . Transportation needs - non-medical: Not on file  Occupational History  . Occupation: retired     Fish farm manager: RETIRED  Tobacco Use  . Smoking status: Former Research scientist (life sciences)  . Smokeless tobacco: Never Used  . Tobacco comment: quit aprox 2000 after 30 years, 1 to 1.5 ppd  Substance and Sexual Activity  . Alcohol use: No  . Drug use: No  . Sexual activity: Not on file  Other Topics Concern  . Not on file  Social History Narrative   Lives w/ husband [name]) and her son   [name] has liver Ca, goes to the [place], to have XRT 07-2017   7 Gk, 8 GGk  Allergies as of 07/24/2017      Reactions   Hydrocodone    Whelps all over   Sertraline Hcl Other (See Comments)    muscle aches, diarrhea, nausea   Codeine Other (See Comments)     chest and stomach pain, severe abd cramping      Medication List        Accurate as of 07/24/17  4:58 PM. Always use your most recent med list.          amLODipine 10 MG tablet Commonly known as:  NORVASC Take 1 tablet (10 mg total) by mouth daily.   aspirin 81 MG tablet Take 81 mg by mouth every morning.   celecoxib 200 MG capsule Commonly known as:  CELEBREX Take 1 capsule (200 mg total) by mouth daily as needed.   citalopram 40 MG tablet Commonly known as:  CELEXA Take 1 tablet (40 mg total) by mouth daily.   ranitidine 300 MG tablet Commonly known as:  ZANTAC Take 1 tablet (300 mg total) at  bedtime by mouth.   simvastatin 20 MG tablet Commonly known as:  ZOCOR Take 1 tablet (20 mg total) by mouth at bedtime.   traMADol 50 MG tablet Commonly known as:  ULTRAM Take 50 mg by mouth 3 (three) times daily as needed for pain.          Objective:   Physical Exam BP 118/78 (BP Location: Left Arm, Patient Position: Sitting, Cuff Size: Small)   Pulse 74   Temp (!) 97.4 F (36.3 C) (Oral)   Resp 14   Ht 5\' 1"  (1.549 m)   Wt 176 lb 2 oz (79.9 kg)   SpO2 97%   BMI 33.28 kg/m  General:   Well developed, well nourished . NAD.  HEENT:  Normocephalic . Face symmetric, atraumatic TMs wnl    Skin: Not pale. Not jaundice Neurologic:  alert & oriented X3.  Speech normal, gait appropriate for age and unassisted Psych--  Cognition and judgment appear intact.  Cooperative with normal attention span and concentration.  Behavior appropriate. No anxious or depressed appearing. Looks better      Assessment & Plan:  Assessment  Prediabetes HTN Hyperlipidemia Anxiety depression, insomnia- xanax rx by pcp GERD CAD: catheterization 2009 and 2016: Non-obstructive CAD, 40% blockages, see report. rx medical treatment Goiter: Last ultrasound 12-2014, nodules increase in size MSK:  --Back - coccyxt pain, lumbar surgery 2014, chronic residual pain --On prn celebrex --on Tramadol (rx by back surgeon)   Vit d def Chronic Dizziness Eczema/psoriasis , sees derm  PLAN: Anxiety depression insomnia: Since the last office visit,   citalopram was increased to 40 mg, overall feels better.  No suicidal ideas.  Will refill her medications. We were unable to get Halcion approved by her insurance,  tried Belsomra, only one time, had  s/e and decided not to take it however as long as she takes her citalopram she is sleeping better.  No change. Unable to see a Social worker . Reports has no time for counseling HTN: Needs a RF on amlodipine. R ear pain: per pt. Resolved, exam normal.   Observation RTC 3 months

## 2017-07-30 ENCOUNTER — Other Ambulatory Visit: Payer: Self-pay | Admitting: Internal Medicine

## 2017-08-05 ENCOUNTER — Telehealth: Payer: Self-pay | Admitting: Internal Medicine

## 2017-08-05 MED ORDER — ALPRAZOLAM 0.25 MG PO TABS
ORAL_TABLET | ORAL | 0 refills | Status: DC
Start: 2017-08-05 — End: 2017-10-25

## 2017-08-05 NOTE — Telephone Encounter (Signed)
I have pended below- can you send to pharmacy please?

## 2017-08-05 NOTE — Telephone Encounter (Signed)
Okay alprazolam 0.25 mg 1 or 2 p.o. before a procedure for pain, no refills.  #7, no refill.  Watch for drowsiness.  Needs a driver.

## 2017-08-05 NOTE — Telephone Encounter (Signed)
Please advise- alprazolam no longer on med list- however she is requesting for MRI on Saturday?

## 2017-08-05 NOTE — Telephone Encounter (Signed)
Sent, thx.

## 2017-08-05 NOTE — Telephone Encounter (Signed)
Refill request. Pt. States she is having a MRI Saturday and needs medicine. Thanks.

## 2017-08-05 NOTE — Telephone Encounter (Signed)
Copied from [place] [id number]. Topic: General - Other >> Aug 05, 2017  1:11 PM Neva Seat wrote: Alprazolam  .25   Pt has no refills - wanting this refilled for procedure on Saturday.   Ashley

## 2017-08-10 DIAGNOSIS — M25511 Pain in right shoulder: Secondary | ICD-10-CM | POA: Diagnosis not present

## 2017-08-13 DIAGNOSIS — M25511 Pain in right shoulder: Secondary | ICD-10-CM | POA: Diagnosis not present

## 2017-08-13 DIAGNOSIS — M13819 Other specified arthritis, unspecified shoulder: Secondary | ICD-10-CM | POA: Diagnosis not present

## 2017-08-21 DIAGNOSIS — M19011 Primary osteoarthritis, right shoulder: Secondary | ICD-10-CM | POA: Diagnosis not present

## 2017-08-29 ENCOUNTER — Other Ambulatory Visit: Payer: Self-pay | Admitting: Internal Medicine

## 2017-09-03 ENCOUNTER — Other Ambulatory Visit: Payer: Self-pay | Admitting: Internal Medicine

## 2017-09-03 MED ORDER — CITALOPRAM HYDROBROMIDE 40 MG PO TABS: 40.0000 mg | ORAL_TABLET | Freq: Every day | ORAL | 6 refills | Status: DC

## 2017-09-03 MED ORDER — PANTOPRAZOLE SODIUM 20 MG PO TBEC: 20.0000 mg | DELAYED_RELEASE_TABLET | Freq: Every day | ORAL | 5 refills | Status: DC

## 2017-09-03 NOTE — Telephone Encounter (Signed)
Reaction between omeprazole 20mg  and citalopram 40mg . Spoke w/ PCP- informed to switch to pantoprazole 20mg  1 tablet daily and inform Pt. Rx sent to [facility]. Tried calling Pt- no answer- unable to leave voicemail.

## 2017-09-04 DIAGNOSIS — M75111 Incomplete rotator cuff tear or rupture of right shoulder, not specified as traumatic: Secondary | ICD-10-CM | POA: Diagnosis not present

## 2017-09-04 DIAGNOSIS — M19011 Primary osteoarthritis, right shoulder: Secondary | ICD-10-CM | POA: Diagnosis not present

## 2017-09-11 ENCOUNTER — Ambulatory Visit: Payer: Self-pay | Admitting: *Deleted

## 2017-09-11 NOTE — Telephone Encounter (Signed)
Pt states when she picked up Rx for Zantac she was also given Rx for Protonix. Noted from RX recommendation to  change PRILOSEC  as pt on Celexa. Pt states she has not taken Prilosec since last refill 03/02/17. Please clarify.  [phone]

## 2017-09-11 NOTE — Telephone Encounter (Signed)
Spoke w/ Pt- informed of interaction between omeprazole and citalopram that we had to change to pantoprazole- informed I had tried to call Pt the day we initially changed meds but was unable to leave message. Pt verbalized understanding.

## 2017-09-18 DIAGNOSIS — S43431D Superior glenoid labrum lesion of right shoulder, subsequent encounter: Secondary | ICD-10-CM | POA: Diagnosis not present

## 2017-09-18 DIAGNOSIS — M75111 Incomplete rotator cuff tear or rupture of right shoulder, not specified as traumatic: Secondary | ICD-10-CM | POA: Diagnosis not present

## 2017-09-28 ENCOUNTER — Other Ambulatory Visit: Payer: Self-pay | Admitting: Internal Medicine

## 2017-09-30 MED ORDER — CITALOPRAM HYDROBROMIDE 40 MG PO TABS: 40.0000 mg | ORAL_TABLET | Freq: Every day | ORAL | 6 refills | Status: DC

## 2017-10-11 ENCOUNTER — Other Ambulatory Visit: Payer: Self-pay | Admitting: Internal Medicine

## 2017-10-24 NOTE — Progress Notes (Addendum)
Subjective:   [name] is a 73 y.o. female who presents for Medicare Annual (Subsequent) preventive examination.  Review of Systems:  No ROS.  Medicare Wellness Visit. Additional risk factors are reflected in the social history.  Cardiac Risk Factors include: advanced age (>37men, >8 women);dyslipidemia;hypertension;sedentary lifestyle Sleep patterns:  Wakes 2x/ to urinate. Takes Melatonin for sleep. Sleeps 8 hrs and feels rested.  Home Safety/Smoke Alarms: Feels safe in home. Smoke alarms in place.  Living environment; residence and Firearm Safety: Lives with husband in 1 story home.  Female:      Mammo- pt reports last done 12/21/17. Normal. Done with [facility].      Dexa scan-01/16/15      CCS-Cologuard ordered.  Objective:     Vitals: BP 126/68 (BP Location: Left Arm, Patient Position: Sitting, Cuff Size: Normal) Comment: Vitals done by K. Canter CMA  Pulse 69   Ht 5\' 1"  (1.549 m)   Wt 178 lb (80.7 kg)   SpO2 97%   BMI 33.63 kg/m   Body mass index is 33.63 kg/m.  Advanced Directives 10/25/2017 12/06/2014 11/14/2012 10/30/2012  Does Patient Have a Medical Advance Directive? No No Patient does not have advance directive;Patient would like information Patient does not have advance directive;Patient would like information  Would patient like information on creating a medical advance directive? Yes (MAU/Ambulatory/Procedural Areas - Information given) - - Advance directive packet given    Tobacco Social History   Tobacco Use  Smoking Status Former Smoker  Smokeless Tobacco Never Used  Tobacco Comment   quit aprox 2000 after 30 years, 1 to 1.5 ppd     Counseling given: Not Answered Comment: quit aprox 2000 after 30 years, 1 to 1.5 ppd   Clinical Intake: Pain : No/denies pain   Past Medical History:  Diagnosis Date  . Anxiety and depression   . Arthritis   . Coccyx pain    Secondary to scar tissue from old pressure ulcer after back  surgery, Dr. Brantley Stage  . Coronary artery disease   . Dizziness    severe: MRI chronic isch. changes and mastoiditis- saw Dr.Mundy(2007)  STATES SHE STILL HAS EPISODES- ESPECIALLY IF SHE GETS UP TOO QUICKLY AFTER LYING DOWN  . Eczema   . GERD (gastroesophageal reflux disease)   . Goiter   . Headache(784.0)   . Hyperlipidemia   . Hypertension   . Pain    PAIN LOWER BACK AND DOWN RIGHT LEG WITH NUMBNESS, TINGLING RT LEG AND FOOT--STENOSIS  . Partial tear of right rotator cuff    & labral tear  . S/P cardiac cath 2009   Revealing nonobstructive CAD w/ tubular, discrete 40% mid lesion in the circumflex; discrete 30% proximal lesion in the LAD; luminal irregularities, 35% mid lesion in RCA; and generic 40% mid lesion in the RCA. Medical treatment recommended.  . S/P cardiac cath 11/24/2014   Revealing nonobstructive CAD w/ tubular, discrete 40% mid lesion in the circumflex; discrete 30% proximal lesion in the LAD; luminal irregularities, 35% mid lesion in RCA; and generic 40% mid lesion in the RCA. Medical treatment recommended.   Past Surgical History:  Procedure Laterality Date  . ABDOMINAL HYSTERECTOMY     oophorectomy L only  . BUNIONECTOMY  1990s   LEFT  . CHOLECYSTECTOMY    . HAND SURGERY  1990s   R hand x 2, L hand x 1  . LUMBAR LAMINECTOMY/DECOMPRESSION MICRODISCECTOMY N/A 11/13/2012   Procedure: MICRO LUMBAR DECOMPRESSION L4 -  L5 AND L2 - L3 2 LEVELS;  Surgeon: Johnn Hai, MD;  Location: [facility];  Service: Orthopedics;  Laterality: N/A;  . SHOULDER SURGERY  2006   left    Family History  Problem Relation Age of Onset  . Breast cancer Other        2 cousins  . Throat cancer Other        cousin  . Heart attack Brother        2 brother s, CABG  . Lung cancer Mother        cousin  . Lung cancer Brother   . Colon cancer Neg Hx   . Diabetes Neg Hx    Social History   Socioeconomic History  . Marital status: Married    Spouse name: Not on file  . Number of children: 4    . Years of education: Not on file  . Highest education level: Not on file  Occupational History  . Occupation: retired     Fish farm manager: RETIRED  Social Needs  . Financial resource strain: Not on file  . Food insecurity:    Worry: Not on file    Inability: Not on file  . Transportation needs:    Medical: Not on file    Non-medical: Not on file  Tobacco Use  . Smoking status: Former Research scientist (life sciences)  . Smokeless tobacco: Never Used  . Tobacco comment: quit aprox 2000 after 30 years, 1 to 1.5 ppd  Substance and Sexual Activity  . Alcohol use: No  . Drug use: No  . Sexual activity: Not Currently  Lifestyle  . Physical activity:    Days per week: Not on file    Minutes per session: Not on file  . Stress: Not on file  Relationships  . Social connections:    Talks on phone: Not on file    Gets together: Not on file    Attends religious service: Not on file    Active member of club or organization: Not on file    Attends meetings of clubs or organizations: Not on file    Relationship status: Not on file  Other Topics Concern  . Not on file  Social History Narrative   Lives w/ husband [name]); youngest son lives w/ them   [name] has liver Ca, goes to the [place], to have XRT 07-2017   7 Gk, 8 GGk    Outpatient Encounter Medications as of 10/25/2017  Medication Sig  . amLODipine (NORVASC) 10 MG tablet Take 1 tablet (10 mg total) by mouth daily.  Marland Kitchen aspirin 81 MG tablet Take 81 mg by mouth every morning.   . celecoxib (CELEBREX) 200 MG capsule Take 1 capsule (200 mg total) by mouth daily as needed. (Patient not taking: Reported on 10/25/2017)  . citalopram (CELEXA) 40 MG tablet Take 1 tablet (40 mg total) by mouth daily.  . pantoprazole (PROTONIX) 20 MG tablet Take 1 tablet (20 mg total) by mouth daily before breakfast. (Patient not taking: Reported on 10/25/2017)  . ranitidine (ZANTAC) 300 MG tablet Take 1 tablet (300 mg total) at bedtime by mouth.  . simvastatin (ZOCOR) 20 MG tablet take 1 tablet by  mouth at bedtime  . traMADol (ULTRAM) 50 MG tablet Take 50 mg by mouth 3 (three) times daily as needed for pain.   . [DISCONTINUED] ALPRAZolam (XANAX) 0.25 MG tablet Take 1-2 tablets by mouth before procedure as needed (Patient not taking: Reported on 10/25/2017)   No facility-administered encounter medications on  file as of 10/25/2017.     Activities of Daily Living In your present state of health, do you have any difficulty performing the following activities: 10/25/2017 03/13/2017  Hearing? N N  Vision? N N  Comment wears glasses for reading -  Difficulty concentrating or making decisions? N N  Walking or climbing stairs? N N  Dressing or bathing? N N  Doing errands, shopping? N N  Preparing Food and eating ? N -  Using the Toilet? N -  In the past six months, have you accidently leaked urine? N -  Do you have problems with loss of bowel control? N -  Managing your Medications? N -  Managing your Finances? N -  Housekeeping or managing your Housekeeping? N -  Some recent data might be hidden    Patient Care Team: Colon Branch, MD as PCP - General [name], Nadara Mustard, MD as Consulting Physician (Gynecology) Ladene Artist, MD as Consulting Physician (Gastroenterology) Erroll Luna, MD as Consulting Physician (General Surgery) Pedro Earls, MD as Attending Physician (Orthopedic Surgery) Netta Cedars, MD as Consulting Physician (Orthopedic Surgery)    Assessment:   This is a routine wellness examination for Chloe. Physical assessment deferred to PCP.   Exercise Activities and Dietary recommendations Current Exercise Habits: The patient does not participate in regular exercise at present, Exercise limited by: None identified Diet (meal preparation, eat out, water intake, caffeinated beverages, dairy products, fruits and vegetables): in general, an "unhealthy" diet, High carb.     Goals    . Increase physical activity       Fall Risk Fall Risk  10/25/2017 03/13/2017 03/07/2016  06/01/2015 11/24/2014  Falls in the past year? No No No No No     Depression Screen PHQ 2/9 Scores 10/25/2017 03/13/2017 03/07/2016 06/01/2015  PHQ - 2 Score 0 2 0 0  PHQ- 9 Score - 10 - -     Cognitive Function MMSE - Mini Mental State Exam 10/25/2017  Orientation to time 5  Orientation to Place 5  Registration 3  Attention/ Calculation 5  Recall 3  Language- name 2 objects 2  Language- repeat 1  Language- follow 3 step command 3  Language- read & follow direction 1  Write a sentence 1  Copy design 1  Total score 30        Immunization History  Administered Date(s) Administered  . Influenza Whole 05/09/2007, 07/18/2009, 06/12/2010  . Influenza, High Dose Seasonal PF 06/01/2015, 05/23/2017  . Influenza, Seasonal, Injecte, Preservative Fre 08/01/2012  . Influenza,inj,Quad PF,6+ Mos 04/07/2014  . Pneumococcal Conjugate-13 12/03/2013  . Pneumococcal Polysaccharide-23 07/18/2009  . Td 07/23/2000  . Tdap 12/20/2010  . Zoster 12/26/2011   Screening Tests Health Maintenance  Topic Date Due  . MAMMOGRAM  08/23/2014  . COLONOSCOPY  03/13/2018 (Originally 08/18/2014)  . INFLUENZA VACCINE  02/20/2018  . TETANUS/TDAP  12/19/2020  . DEXA SCAN  Completed  . Hepatitis C Screening  Completed  . PNA vac Low Risk Adult  Completed        Plan:   Follow up with Dr.Paz as directed  Continue to eat heart healthy diet (full of fruits, vegetables, whole grains, lean protein, water--limit salt, fat, and sugar intake) and increase physical activity as tolerated.  Continue doing brain stimulating activities (puzzles, reading, adult coloring books, staying active) to keep memory sharp.     I have personally reviewed and noted the following in the patient's chart:   . Medical and social history .  Use of alcohol, tobacco or illicit drugs  . Current medications and supplements . Functional ability and status . Nutritional status . Physical activity . Advanced directives . List of other  physicians . Hospitalizations, surgeries, and ER visits in previous 12 months . Vitals . Screenings to include cognitive, depression, and falls . Referrals and appointments  In addition, I have reviewed and discussed with patient certain preventive protocols, quality metrics, and best practice recommendations. A written personalized care plan for preventive services as well as general preventive health recommendations were provided to patient.     Naaman Plummer [place], [place]  10/25/2017  Kathlene November, MD

## 2017-10-25 ENCOUNTER — Telehealth: Payer: Self-pay

## 2017-10-25 ENCOUNTER — Encounter: Payer: Self-pay | Admitting: Internal Medicine

## 2017-10-25 ENCOUNTER — Ambulatory Visit (INDEPENDENT_AMBULATORY_CARE_PROVIDER_SITE_OTHER): Payer: Medicare Other | Admitting: *Deleted

## 2017-10-25 ENCOUNTER — Encounter: Payer: Self-pay | Admitting: *Deleted

## 2017-10-25 ENCOUNTER — Ambulatory Visit (INDEPENDENT_AMBULATORY_CARE_PROVIDER_SITE_OTHER): Payer: Medicare Other | Admitting: Internal Medicine

## 2017-10-25 VITALS — BP 126/68 | HR 69 | Temp 97.9°F | Resp 14 | Ht 61.0 in | Wt 178.1 lb

## 2017-10-25 VITALS — BP 126/68 | HR 69 | Ht 61.0 in | Wt 178.0 lb

## 2017-10-25 DIAGNOSIS — Z0000 Encounter for general adult medical examination without abnormal findings: Secondary | ICD-10-CM | POA: Diagnosis not present

## 2017-10-25 DIAGNOSIS — E785 Hyperlipidemia, unspecified: Secondary | ICD-10-CM

## 2017-10-25 LAB — COMPREHENSIVE METABOLIC PANEL
ALBUMIN: 4.2 g/dL (ref 3.5–5.2)
ALT: 11 U/L (ref 0–35)
AST: 15 U/L (ref 0–37)
Alkaline Phosphatase: 86 U/L (ref 39–117)
BUN: 17 mg/dL (ref 6–23)
CHLORIDE: 103 meq/L (ref 96–112)
CO2: 25 meq/L (ref 19–32)
Calcium: 9.4 mg/dL (ref 8.4–10.5)
Creatinine, Ser: 0.71 mg/dL (ref 0.40–1.20)
GFR: 85.58 mL/min (ref >60.00)
Glucose, Bld: 102 mg/dL — ABNORMAL HIGH (ref 70–99)
POTASSIUM: 3.7 meq/L (ref 3.5–5.1)
SODIUM: 138 meq/L (ref 135–145)
Total Bilirubin: 0.4 mg/dL (ref 0.2–1.2)
Total Protein: 7.2 g/dL (ref 6.0–8.3)

## 2017-10-25 LAB — CBC WITH DIFFERENTIAL/PLATELET
BASOS PCT: 0.4 % (ref 0.0–3.0)
Basophils Absolute: 0 10*3/uL (ref 0.0–0.1)
EOS PCT: 0.3 % (ref 0.0–5.0)
Eosinophils Absolute: 0 10*3/uL (ref 0.0–0.7)
HCT: 44.9 % (ref 36.0–46.0)
HEMOGLOBIN: 15.1 g/dL — AB (ref 12.0–15.0)
Lymphocytes Relative: 23.5 % (ref 12.0–46.0)
Lymphs Abs: 2.1 10*3/uL (ref 0.7–4.0)
MCHC: 33.7 g/dL (ref 30.0–36.0)
MCV: 92.4 fl (ref 78.0–100.0)
MONO ABS: 0.7 10*3/uL (ref 0.1–1.0)
Monocytes Relative: 7.7 % (ref 3.0–12.0)
NEUTROS ABS: 6 10*3/uL (ref 1.4–7.7)
Neutrophils Relative %: 68.1 % (ref 43.0–77.0)
Platelets: 185 10*3/uL (ref 150.0–400.0)
RBC: 4.85 Mil/uL (ref 3.87–5.11)
RDW: 14.3 % (ref 11.5–15.5)
WBC: 8.9 10*3/uL (ref 4.0–10.5)

## 2017-10-25 NOTE — Patient Instructions (Signed)
GO TO THE LAB : Get the blood work     GO TO THE FRONT DESK Schedule your next appointment for a routine checkup in 6 months, fasting   

## 2017-10-25 NOTE — Progress Notes (Signed)
Pre visit review using our clinic review tool, if applicable. No additional management support is needed unless otherwise documented below in the visit note. 

## 2017-10-25 NOTE — Progress Notes (Signed)
Subjective:    Patient ID: [name], female    DOB: 1946-01-12, 72 y.o.   MRN: [id number]  DOS:  10/25/2017 Type of visit - description : cpx Interval history: Here for CPX, we also address her chronic medical problems, they seem to be stable.   Review of Systems Continue with shoulder pain. Sleeping better  Other than above, a 14 point review of systems is negative    Past Medical History:  Diagnosis Date  . Anxiety and depression   . Arthritis   . Coccyx pain    Secondary to scar tissue from old pressure ulcer after back surgery, Dr. Brantley Stage  . Coronary artery disease   . Dizziness    severe: MRI chronic isch. changes and mastoiditis- saw Dr.Mundy(2007)  STATES SHE STILL HAS EPISODES- ESPECIALLY IF SHE GETS UP TOO QUICKLY AFTER LYING DOWN  . Eczema   . GERD (gastroesophageal reflux disease)   . Goiter   . Headache(784.0)   . Hyperlipidemia   . Hypertension   . Pain    PAIN LOWER BACK AND DOWN RIGHT LEG WITH NUMBNESS, TINGLING RT LEG AND FOOT--STENOSIS  . Partial tear of right rotator cuff    & labral tear  . S/P cardiac cath 2009   Revealing nonobstructive CAD w/ tubular, discrete 40% mid lesion in the circumflex; discrete 30% proximal lesion in the LAD; luminal irregularities, 35% mid lesion in RCA; and generic 40% mid lesion in the RCA. Medical treatment recommended.  . S/P cardiac cath 11/24/2014   Revealing nonobstructive CAD w/ tubular, discrete 40% mid lesion in the circumflex; discrete 30% proximal lesion in the LAD; luminal irregularities, 35% mid lesion in RCA; and generic 40% mid lesion in the RCA. Medical treatment recommended.    Past Surgical History:  Procedure Laterality Date  . ABDOMINAL HYSTERECTOMY     oophorectomy L only  . BUNIONECTOMY  1990s   LEFT  . CHOLECYSTECTOMY    . HAND SURGERY  1990s   R hand x 2, L hand x 1  . LUMBAR LAMINECTOMY/DECOMPRESSION MICRODISCECTOMY N/A 11/13/2012   Procedure: MICRO LUMBAR DECOMPRESSION L4 - L5 AND L2  - L3 2 LEVELS;  Surgeon: Johnn Hai, MD;  Location: [facility];  Service: Orthopedics;  Laterality: N/A;  . SHOULDER SURGERY  2006   left     Social History   Socioeconomic History  . Marital status: Married    Spouse name: Not on file  . Number of children: 4  . Years of education: Not on file  . Highest education level: Not on file  Occupational History  . Occupation: retired     Fish farm manager: RETIRED  Social Needs  . Financial resource strain: Not on file  . Food insecurity:    Worry: Not on file    Inability: Not on file  . Transportation needs:    Medical: Not on file    Non-medical: Not on file  Tobacco Use  . Smoking status: Former Research scientist (life sciences)  . Smokeless tobacco: Never Used  . Tobacco comment: quit aprox 2000 after 30 years, 1 to 1.5 ppd  Substance and Sexual Activity  . Alcohol use: No  . Drug use: No  . Sexual activity: Not on file  Lifestyle  . Physical activity:    Days per week: Not on file    Minutes per session: Not on file  . Stress: Not on file  Relationships  . Social connections:    Talks on phone: Not on file  Gets together: Not on file    Attends religious service: Not on file    Active member of club or organization: Not on file    Attends meetings of clubs or organizations: Not on file    Relationship status: Not on file  . Intimate partner violence:    Fear of current or ex partner: Not on file    Emotionally abused: Not on file    Physically abused: Not on file    Forced sexual activity: Not on file  Other Topics Concern  . Not on file  Social History Narrative   Lives w/ husband [name]) and her son   [name] has liver Ca, goes to the [place], to have XRT 07-2017   7 Gk, 8 GGk     Family History  Problem Relation Age of Onset  . Breast cancer Other        2 cousins  . Throat cancer Other        cousin  . Heart attack Brother        2 brother s, CABG  . Lung cancer Mother        cousin  . Lung cancer Brother   . Colon cancer Neg Hx   .  Diabetes Neg Hx      Allergies as of 10/25/2017      Reactions   Hydrocodone    Whelps all over   Sertraline Hcl Other (See Comments)    muscle aches, diarrhea, nausea   Codeine Other (See Comments)     chest and stomach pain, severe abd cramping      Medication List        Accurate as of 10/25/17  9:59 AM. Always use your most recent med list.          ALPRAZolam 0.25 MG tablet Commonly known as:  XANAX Take 1-2 tablets by mouth before procedure as needed   amLODipine 10 MG tablet Commonly known as:  NORVASC Take 1 tablet (10 mg total) by mouth daily.   aspirin 81 MG tablet Take 81 mg by mouth every morning.   celecoxib 200 MG capsule Commonly known as:  CELEBREX Take 1 capsule (200 mg total) by mouth daily as needed.   citalopram 40 MG tablet Commonly known as:  CELEXA Take 1 tablet (40 mg total) by mouth daily.   pantoprazole 20 MG tablet Commonly known as:  PROTONIX Take 1 tablet (20 mg total) by mouth daily before breakfast.   ranitidine 300 MG tablet Commonly known as:  ZANTAC Take 1 tablet (300 mg total) at bedtime by mouth.   simvastatin 20 MG tablet Commonly known as:  ZOCOR take 1 tablet by mouth at bedtime   traMADol 50 MG tablet Commonly known as:  ULTRAM Take 50 mg by mouth 3 (three) times daily as needed for pain.          Objective:   Physical Exam BP 126/68 (BP Location: Left Arm, Patient Position: Sitting, Cuff Size: Normal)   Pulse 69   Temp 97.9 F (36.6 C) (Oral)   Resp 14   Ht 5\' 1"  (1.549 m)   Wt 178 lb 2 oz (80.8 kg)   SpO2 97%   BMI 33.66 kg/m   General:   Well developed, well nourished . NAD.  Neck: No  thyromegaly  HEENT:  Normocephalic . Face symmetric, atraumatic Lungs:  CTA B Normal respiratory effort, no intercostal retractions, no accessory muscle use. Heart: RRR,  no murmur.  No pretibial  edema bilaterally  Abdomen:  Not distended, soft, non-tender. No rebound or rigidity.   Skin: Exposed areas without  rash. Not pale. Not jaundice Neurologic:  alert & oriented X3.  Speech normal, gait appropriate for age and unassisted. Strength symmetric and appropriate for age.  Psych: Cognition and judgment appear intact.  Cooperative with normal attention span and concentration.  Behavior appropriate. No anxious or depressed appearing.     Assessment & Plan:   Assessment  Prediabetes HTN Hyperlipidemia Anxiety depression, insomnia- xanax rx by pcp GERD CAD: catheterization 2009 and 2016: Non-obstructive CAD, 40% blockages, see report. rx medical treatment Goiter: Last ultrasound 12-2014, nodules decrease in size MSK:  --Back - coccyxt pain, lumbar surgery 2014, chronic residual pain --On prn celebrex --on Tramadol (rx by back surgeon)   Vit d def Chronic Dizziness Eczema/psoriasis , sees derm  PLAN: Prediabetes: Diet controlled, last A1c satisfactory HTN: Continue amlodipine, controlled. Hyperlipidemia: Well-controlled on simvastatin. Anxiety, depression, insomnia: Currently well controlled with citalopram, OTC melatonin every night.  Sleeping well. GERD: Currently on Zantac only, controlled, no need for PPIs at this point. History of goiter: Normal physical exam today, last [place] 2016, nodules decreased in size.  Observation MSK: Continued shoulder pain, had 3 shots, feeling slightly better.  They are contemplating possible surgery on the right shoulder. RTC 6 months

## 2017-10-25 NOTE — Assessment & Plan Note (Addendum)
Td   2012 ; pneumonia shot 2010; prevnar--2015 ; shingles shot 2013 -female care Sees   had a PAP 02-2014, saw gyn ~ 12-2015, next appointment for July 2019 per pt  DEXA 2016 T score -1.1  MMG  2016 per [facility],  at gyn per pt  --[facility]: Cscope 2004, + polyps adenomatous colonoscopy  07-2009, no polyps.  She continued to decline a colonoscopy ("husband had to have 2 surgeries after a colonoscopy"), discussed  alternative such as stool testing, if they come back positive then she will be willing to proceed w/ a scope.  Elected Cologuard.  Rec to check with her insurance before sending the sample -Labs: CMP, CBC -Diet and exercise discussed

## 2017-10-25 NOTE — Telephone Encounter (Signed)
Cologuard ordered through [employer] portal. Order number: [id number].

## 2017-10-25 NOTE — Patient Instructions (Signed)
Continue to eat heart healthy diet (full of fruits, vegetables, whole grains, lean protein, water--limit salt, fat, and sugar intake) and increase physical activity as tolerated.  Bring a copy of your living will and/or healthcare power of attorney to your next office visit.  Continue doing brain stimulating activities (puzzles, reading, adult coloring books, staying active) to keep memory sharp.    [name] , Thank you for taking time to come for your Medicare Wellness Visit. I appreciate your ongoing commitment to your health goals. Please review the following plan we discussed and let me know if I can assist you in the future.   These are the goals we discussed: Goals    . Increase physical activity       This is a list of the screening recommended for you and due dates:  Health Maintenance  Topic Date Due  . Mammogram  08/23/2014  . Colon Cancer Screening  03/13/2018*  . Flu Shot  02/20/2018  . Tetanus Vaccine  12/19/2020  . DEXA scan (bone density measurement)  Completed  .  Hepatitis C: One time screening is recommended by Center for Disease Control  (CDC) for  adults born from [birth year] through 1967.   Completed  . Pneumonia vaccines  Completed  *Topic was postponed. The date shown is not the original due date.    Health Maintenance for Postmenopausal Women Menopause is a normal process in which your reproductive ability comes to an end. This process happens gradually over a span of months to years, usually between the ages of 47 and 77. Menopause is complete when you have missed 12 consecutive menstrual periods. It is important to talk with your health care provider about some of the most common conditions that affect postmenopausal women, such as heart disease, cancer, and bone loss (osteoporosis). Adopting a healthy lifestyle and getting preventive care can help to promote your health and wellness. Those actions can also lower your chances of developing some of these common  conditions. What should I know about menopause? During menopause, you may experience a number of symptoms, such as:  Moderate-to-severe hot flashes.  Night sweats.  Decrease in sex drive.  Mood swings.  Headaches.  Tiredness.  Irritability.  Memory problems.  Insomnia.  Choosing to treat or not to treat menopausal changes is an individual decision that you make with your health care provider. What should I know about hormone replacement therapy and supplements? Hormone therapy products are effective for treating symptoms that are associated with menopause, such as hot flashes and night sweats. Hormone replacement carries certain risks, especially as you become older. If you are thinking about using estrogen or estrogen with progestin treatments, discuss the benefits and risks with your health care provider. What should I know about heart disease and stroke? Heart disease, heart attack, and stroke become more likely as you age. This may be due, in part, to the hormonal changes that your body experiences during menopause. These can affect how your body processes dietary fats, triglycerides, and cholesterol. Heart attack and stroke are both medical emergencies. There are many things that you can do to help prevent heart disease and stroke:  Have your blood pressure checked at least every 1-2 years. High blood pressure causes heart disease and increases the risk of stroke.  If you are 56-59 years old, ask your health care provider if you should take aspirin to prevent a heart attack or a stroke.  Do not use any tobacco products, including cigarettes, chewing tobacco,  or electronic cigarettes. If you need help quitting, ask your health care provider.  It is important to eat a healthy diet and maintain a healthy weight. ? Be sure to include plenty of vegetables, fruits, low-fat dairy products, and lean protein. ? Avoid eating foods that are high in solid fats, added sugars, or salt  (sodium).  Get regular exercise. This is one of the most important things that you can do for your health. ? Try to exercise for at least 150 minutes each week. The type of exercise that you do should increase your heart rate and make you sweat. This is known as moderate-intensity exercise. ? Try to do strengthening exercises at least twice each week. Do these in addition to the moderate-intensity exercise.  Know your numbers.Ask your health care provider to check your cholesterol and your blood glucose. Continue to have your blood tested as directed by your health care provider.  What should I know about cancer screening? There are several types of cancer. Take the following steps to reduce your risk and to catch any cancer development as early as possible. Breast Cancer  Practice breast self-awareness. ? This means understanding how your breasts normally appear and feel. ? It also means doing regular breast self-exams. Let your health care provider know about any changes, no matter how small.  If you are 37 or older, have a clinician do a breast exam (clinical breast exam or CBE) every year. Depending on your age, family history, and medical history, it may be recommended that you also have a yearly breast X-ray (mammogram).  If you have a family history of breast cancer, talk with your health care provider about genetic screening.  If you are at high risk for breast cancer, talk with your health care provider about having an MRI and a mammogram every year.  Breast cancer (BRCA) gene test is recommended for women who have family members with BRCA-related cancers. Results of the assessment will determine the need for genetic counseling and BRCA1 and for BRCA2 testing. BRCA-related cancers include these types: ? Breast. This occurs in males or females. ? Ovarian. ? Tubal. This may also be called fallopian tube cancer. ? Cancer of the abdominal or pelvic lining (peritoneal  cancer). ? Prostate. ? Pancreatic.  Cervical, Uterine, and Ovarian Cancer Your health care provider may recommend that you be screened regularly for cancer of the pelvic organs. These include your ovaries, uterus, and vagina. This screening involves a pelvic exam, which includes checking for microscopic changes to the surface of your cervix (Pap test).  For women ages 21-65, health care providers may recommend a pelvic exam and a Pap test every three years. For women ages 91-65, they may recommend the Pap test and pelvic exam, combined with testing for human papilloma virus (HPV), every five years. Some types of HPV increase your risk of cervical cancer. Testing for HPV may also be done on women of any age who have unclear Pap test results.  Other health care providers may not recommend any screening for nonpregnant women who are considered low risk for pelvic cancer and have no symptoms. Ask your health care provider if a screening pelvic exam is right for you.  If you have had past treatment for cervical cancer or a condition that could lead to cancer, you need Pap tests and screening for cancer for at least 20 years after your treatment. If Pap tests have been discontinued for you, your risk factors (such as having a new  sexual partner) need to be reassessed to determine if you should start having screenings again. Some women have medical problems that increase the chance of getting cervical cancer. In these cases, your health care provider may recommend that you have screening and Pap tests more often.  If you have a family history of uterine cancer or ovarian cancer, talk with your health care provider about genetic screening.  If you have vaginal bleeding after reaching menopause, tell your health care provider.  There are currently no reliable tests available to screen for ovarian cancer.  Lung Cancer Lung cancer screening is recommended for adults 86-41 years old who are at high risk for  lung cancer because of a history of smoking. A yearly low-dose CT scan of the lungs is recommended if you:  Currently smoke.  Have a history of at least 30 pack-years of smoking and you currently smoke or have quit within the past 15 years. A pack-year is smoking an average of one pack of cigarettes per day for one year.  Yearly screening should:  Continue until it has been 15 years since you quit.  Stop if you develop a health problem that would prevent you from having lung cancer treatment.  Colorectal Cancer  This type of cancer can be detected and can often be prevented.  Routine colorectal cancer screening usually begins at age 80 and continues through age 75.  If you have risk factors for colon cancer, your health care provider may recommend that you be screened at an earlier age.  If you have a family history of colorectal cancer, talk with your health care provider about genetic screening.  Your health care provider may also recommend using home test kits to check for hidden blood in your stool.  A small camera at the end of a tube can be used to examine your colon directly (sigmoidoscopy or colonoscopy). This is done to check for the earliest forms of colorectal cancer.  Direct examination of the colon should be repeated every 5-10 years until age 39. However, if early forms of precancerous polyps or small growths are found or if you have a family history or genetic risk for colorectal cancer, you may need to be screened more often.  Skin Cancer  Check your skin from head to toe regularly.  Monitor any moles. Be sure to tell your health care provider: ? About any new moles or changes in moles, especially if there is a change in a mole's shape or color. ? If you have a mole that is larger than the size of a pencil eraser.  If any of your family members has a history of skin cancer, especially at a young age, talk with your health care provider about genetic  screening.  Always use sunscreen. Apply sunscreen liberally and repeatedly throughout the day.  Whenever you are outside, protect yourself by wearing long sleeves, pants, a wide-brimmed hat, and sunglasses.  What should I know about osteoporosis? Osteoporosis is a condition in which bone destruction happens more quickly than new bone creation. After menopause, you may be at an increased risk for osteoporosis. To help prevent osteoporosis or the bone fractures that can happen because of osteoporosis, the following is recommended:  If you are 71-38 years old, get at least 1,000 mg of calcium and at least 600 mg of vitamin D per day.  If you are older than age 47 but younger than age 64, get at least 1,200 mg of calcium and at least 600  mg of vitamin D per day.  If you are older than age 93, get at least 1,200 mg of calcium and at least 800 mg of vitamin D per day.  Smoking and excessive alcohol intake increase the risk of osteoporosis. Eat foods that are rich in calcium and vitamin D, and do weight-bearing exercises several times each week as directed by your health care provider. What should I know about how menopause affects my mental health? Depression may occur at any age, but it is more common as you become older. Common symptoms of depression include:  Low or sad mood.  Changes in sleep patterns.  Changes in appetite or eating patterns.  Feeling an overall lack of motivation or enjoyment of activities that you previously enjoyed.  Frequent crying spells.  Talk with your health care provider if you think that you are experiencing depression. What should I know about immunizations? It is important that you get and maintain your immunizations. These include:  Tetanus, diphtheria, and pertussis (Tdap) booster vaccine.  Influenza every year before the flu season begins.  Pneumonia vaccine.  Shingles vaccine.  Your health care provider may also recommend other  immunizations. This information is not intended to replace advice given to you by your health care provider. Make sure you discuss any questions you have with your health care provider. Document Released: 08/31/2005 Document Revised: 01/27/2016 Document Reviewed: 04/12/2015 Elsevier Interactive Patient Education  2018 [employer].

## 2017-10-26 NOTE — Assessment & Plan Note (Signed)
Prediabetes: Diet controlled, last A1c satisfactory HTN: Continue amlodipine, controlled. Hyperlipidemia: Well-controlled on simvastatin. Anxiety, depression, insomnia: Currently well controlled with citalopram, OTC melatonin every night.  Sleeping well. GERD: Currently on Zantac only, controlled, no need for PPIs at this point. History of goiter: Normal physical exam today, last [place] 2016, nodules decreased in size.  Observation MSK: Continued shoulder pain, had 3 shots, feeling slightly better.  They are contemplating possible surgery on the right shoulder. RTC 6 months

## 2017-11-06 ENCOUNTER — Telehealth: Payer: Self-pay | Admitting: Internal Medicine

## 2017-11-06 MED ORDER — ALPRAZOLAM 0.25 MG PO TABS: 0.2500 mg | ORAL_TABLET | Freq: Every evening | ORAL | 0 refills | Status: DC | PRN

## 2017-11-06 NOTE — Telephone Encounter (Signed)
Please advies

## 2017-11-06 NOTE — Telephone Encounter (Signed)
Spoke w/ Pt- informed of risks of Xanax- and that med has been sent to [facility]. Pt verbalized understanding.

## 2017-11-06 NOTE — Telephone Encounter (Signed)
Copied from [place] [id number]. Topic: Quick Communication - See Telephone Encounter >> Nov 06, 2017 12:30 PM Hewitt Shorts wrote: CRM for notification. See Telephone encounter for: 11/06/17. Pt is needing a rx for alazapan 0.25 pt states that she has been off of it for a while but since the problems her husband is having and is in the hospital now is needing some thing for her nerves called in   Best number [phone]  [facility]

## 2017-11-06 NOTE — Telephone Encounter (Signed)
I agree, prescription sent, to take at night as needed. Please warn patient about  excessive somnolence.  Also made her aware of increased risk of falls.  Needs to be very careful.

## 2017-11-06 NOTE — Telephone Encounter (Signed)
Pt. Is requesting Alprazolam.

## 2017-12-09 ENCOUNTER — Other Ambulatory Visit: Payer: Self-pay | Admitting: Internal Medicine

## 2017-12-30 DIAGNOSIS — Z1211 Encounter for screening for malignant neoplasm of colon: Secondary | ICD-10-CM | POA: Diagnosis not present

## 2017-12-30 LAB — COLOGUARD: Cologuard: POSITIVE

## 2018-01-10 ENCOUNTER — Telehealth: Payer: Self-pay | Admitting: Internal Medicine

## 2018-01-10 DIAGNOSIS — R195 Other fecal abnormalities: Secondary | ICD-10-CM

## 2018-01-10 NOTE — Telephone Encounter (Signed)
Copied from [place] [phone]. Topic: Quick Communication - See Telephone Encounter >> Jan 10, 2018  9:38 AM Bea Graff, NT wrote: CRM for notification. See Telephone encounter for: 01/10/18. Shalon with [employer] and states the cologuard results for this pt are available online for Dr. Larose Kells to review as well as she will be faxing the results over today. She states the results are abnormal and if Dr. Larose Kells has not received the result by the end of the day to please contact them at [phone] and follow prompts to provider support.

## 2018-01-10 NOTE — Telephone Encounter (Signed)
Results received- forwarding to provider.

## 2018-01-13 ENCOUNTER — Encounter: Payer: Self-pay | Admitting: Internal Medicine

## 2018-01-13 ENCOUNTER — Encounter: Payer: Self-pay | Admitting: Gastroenterology

## 2018-01-13 NOTE — Telephone Encounter (Signed)
+   cologuard, spoke with the patient, she is aware that there is not the equivalent of a cancer diagnosis, she does need further evaluation and we agreed on a GI referral.  Please arrange

## 2018-01-13 NOTE — Telephone Encounter (Signed)
Referral placed.

## 2018-01-21 ENCOUNTER — Ambulatory Visit (AMBULATORY_SURGERY_CENTER): Payer: Self-pay

## 2018-01-21 VITALS — Ht 60.05 in | Wt 180.2 lb

## 2018-01-21 DIAGNOSIS — R195 Other fecal abnormalities: Secondary | ICD-10-CM

## 2018-01-21 MED ORDER — NA SULFATE-K SULFATE-MG SULF 17.5-3.13-1.6 GM/177ML PO SOLN: 1.0000 | Freq: Once | ORAL | 0 refills | Status: AC

## 2018-01-21 NOTE — Progress Notes (Signed)
Per pt, no allergies to soy or egg products.Pt not taking any weight loss meds or using  O2 at home.  Pt refused emmi video. 

## 2018-01-22 ENCOUNTER — Encounter: Payer: Self-pay | Admitting: Gastroenterology

## 2018-01-22 DIAGNOSIS — Z1283 Encounter for screening for malignant neoplasm of skin: Secondary | ICD-10-CM | POA: Diagnosis not present

## 2018-01-22 DIAGNOSIS — L218 Other seborrheic dermatitis: Secondary | ICD-10-CM | POA: Diagnosis not present

## 2018-01-22 DIAGNOSIS — L821 Other seborrheic keratosis: Secondary | ICD-10-CM | POA: Diagnosis not present

## 2018-02-04 ENCOUNTER — Encounter: Payer: Self-pay | Admitting: Gastroenterology

## 2018-02-04 ENCOUNTER — Ambulatory Visit (AMBULATORY_SURGERY_CENTER): Payer: Medicare Other | Admitting: Gastroenterology

## 2018-02-04 VITALS — BP 118/73 | HR 69 | Temp 95.5°F | Resp 13 | Ht 60.0 in | Wt 180.0 lb

## 2018-02-04 DIAGNOSIS — D124 Benign neoplasm of descending colon: Secondary | ICD-10-CM | POA: Diagnosis not present

## 2018-02-04 DIAGNOSIS — Z86010 Personal history of colonic polyps: Secondary | ICD-10-CM | POA: Diagnosis not present

## 2018-02-04 DIAGNOSIS — R195 Other fecal abnormalities: Secondary | ICD-10-CM | POA: Diagnosis present

## 2018-02-04 DIAGNOSIS — D122 Benign neoplasm of ascending colon: Secondary | ICD-10-CM

## 2018-02-04 MED ORDER — SODIUM CHLORIDE 0.9 % IV SOLN: 500.0000 mL | Freq: Once | INTRAVENOUS | Status: DC

## 2018-02-04 NOTE — Patient Instructions (Signed)
YOU HAD AN ENDOSCOPIC PROCEDURE TODAY AT [facility]:   Refer to the procedure report that was given to you for any specific questions about what was found during the examination.  If the procedure report does not answer your questions, please call your gastroenterologist to clarify.  If you requested that your care partner not be given the details of your procedure findings, then the procedure report has been included in a sealed envelope for you to review at your convenience later.  YOU SHOULD EXPECT: Some feelings of bloating in the abdomen. Passage of more gas than usual.  Walking can help get rid of the air that was put into your GI tract during the procedure and reduce the bloating. If you had a lower endoscopy (such as a colonoscopy or flexible sigmoidoscopy) you may notice spotting of blood in your stool or on the toilet paper. If you underwent a bowel prep for your procedure, you may not have a normal bowel movement for a few days.  Please Note:  You might notice some irritation and congestion in your nose or some drainage.  This is from the oxygen used during your procedure.  There is no need for concern and it should clear up in a day or so.  SYMPTOMS TO REPORT IMMEDIATELY:   Following lower endoscopy (colonoscopy or flexible sigmoidoscopy):  Excessive amounts of blood in the stool  Significant tenderness or worsening of abdominal pains  Swelling of the abdomen that is new, acute  Fever of 100F or higher   For urgent or emergent issues, a gastroenterologist can be reached at any hour by calling [phone].   DIET:  We do recommend a small meal at first, but then you may proceed to your regular diet.  Drink plenty of fluids but you should avoid alcoholic beverages for 24 hours.  ACTIVITY:  You should plan to take it easy for the rest of today and you should NOT DRIVE or use heavy machinery until tomorrow (because of the sedation medicines used during the test).     FOLLOW UP: Our staff will call the number listed on your records the next business day following your procedure to check on you and address any questions or concerns that you may have regarding the information given to you following your procedure. If we do not reach you, we will leave a message.  However, if you are feeling well and you are not experiencing any problems, there is no need to return our call.  We will assume that you have returned to your regular daily activities without incident.  If any biopsies were taken you will be contacted by phone or by letter within the next 1-3 weeks.  Please call us at [phone] if you have not heard about the biopsies in 3 weeks.    SIGNATURES/CONFIDENTIALITY: You and/or your care partner have signed paperwork which will be entered into your electronic medical record.  These signatures attest to the fact that that the information above on your After Visit Summary has been reviewed and is understood.  Full responsibility of the confidentiality of this discharge information lies with you and/or your care-partner.  You need another colonoscopy in 5 years per Dr. Fuller Plan.

## 2018-02-04 NOTE — Op Note (Signed)
[place] Patient Name: [name] Procedure Date: 02/04/2018 9:58 AM MRN: [id number] Endoscopist: Ladene Artist , MD Age: 72 Referring MD:  Date of Birth: [birth year] Gender: Female Account #: [id number] Procedure:                Colonoscopy Indications:              Positive Cologuard test. Personal history of                            adenomatous colon polyps. Medicines:                Monitored Anesthesia Care Procedure:                Pre-Anesthesia Assessment:                           - Prior to the procedure, a History and Physical                            was performed, and patient medications and                            allergies were reviewed. [name] tolerance of                            previous anesthesia was also reviewed. The risks                            and benefits of the procedure and the sedation                            options and risks were discussed with the patient.                            All questions were answered, and informed consent                            was obtained. Prior Anticoagulants: The patient has                            taken no previous anticoagulant or antiplatelet                            agents. ASA Grade Assessment: II - A patient with                            mild systemic disease. After reviewing the risks                            and benefits, the patient was deemed in                            satisfactory condition to undergo the procedure.  After obtaining informed consent, the colonoscope                            was passed under direct vision. Throughout the                            procedure, the patient's blood pressure, pulse, and                            oxygen saturations were monitored continuously. The                            Model PCF-[id number]) scope was introduced                            through the anus and advanced to  the the cecum,                            identified by appendiceal orifice and ileocecal                            valve. The ileocecal valve, appendiceal orifice,                            and rectum were photographed. The quality of the                            bowel preparation was good. The colonoscopy was                            performed without difficulty. The patient tolerated                            the procedure well. Scope In: 10:09:01 AM Scope Out: 10:25:08 AM Scope Withdrawal Time: 0 hours 14 minutes 15 seconds  Total Procedure Duration: 0 hours 16 minutes 7 seconds  Findings:                 The perianal and digital rectal examinations were                            normal.                           Two sessile polyps were found in the descending                            colon and ascending colon. The polyps were 6 to 8                            mm in size. These polyps were removed with a cold                            snare. Resection and retrieval were complete.  The exam was otherwise without abnormality on                            direct and retroflexion views. Complications:            No immediate complications. Estimated blood loss:                            None. Estimated Blood Loss:     Estimated blood loss: none. Impression:               - Two 6 to 8 mm polyps in the descending colon and                            in the ascending colon, removed with a cold snare.                            Resected and retrieved.                           - The examination was otherwise normal on direct                            and retroflexion views. Recommendation:           - Repeat colonoscopy in 5 years for surveillance.                           - She is not an appropriate candidate for Cologuard                            given her personal history of precancerous colon                            polyps.                            - Patient has a contact number available for                            emergencies. The signs and symptoms of potential                            delayed complications were discussed with the                            patient. Return to normal activities tomorrow.                            Written discharge instructions were provided to the                            patient.                           - Resume previous diet.                           -  Continue present medications.                           - Await pathology results. Ladene Artist, MD 02/04/2018 10:29:16 AM This report has been signed electronically.

## 2018-02-04 NOTE — Progress Notes (Signed)
Report to PACU, RN, vss, BBS= Clear.  

## 2018-02-04 NOTE — Progress Notes (Signed)
Called to room to assist during endoscopic procedure.  Patient ID and intended procedure confirmed with present staff. Received instructions for my participation in the procedure from the performing physician.  

## 2018-02-05 ENCOUNTER — Telehealth: Payer: Self-pay

## 2018-02-05 NOTE — Telephone Encounter (Signed)
Attempted to reach patient for post-procedure f/u call. No answer. Left message that we will make another attempt to reach her later today and for her to please not hesitate to call us if she has any questions/concerns regarding her care. 

## 2018-02-11 ENCOUNTER — Encounter: Payer: Self-pay | Admitting: Gastroenterology

## 2018-05-01 ENCOUNTER — Telehealth: Payer: Self-pay

## 2018-05-01 ENCOUNTER — Ambulatory Visit: Payer: Medicare Other | Admitting: Internal Medicine

## 2018-05-01 NOTE — Telephone Encounter (Signed)
Ok, no charge, reschedule at her convenience

## 2018-05-01 NOTE — Telephone Encounter (Signed)
She is scheduled 10/17.
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
## 2018-05-01 NOTE — Telephone Encounter (Signed)
FYI

## 2018-05-08 ENCOUNTER — Ambulatory Visit (INDEPENDENT_AMBULATORY_CARE_PROVIDER_SITE_OTHER): Payer: Medicare Other | Admitting: Internal Medicine

## 2018-05-08 ENCOUNTER — Encounter: Payer: Self-pay | Admitting: Internal Medicine

## 2018-05-08 VITALS — BP 132/76 | HR 73 | Temp 98.3°F | Resp 16 | Ht 60.0 in | Wt 182.2 lb

## 2018-05-08 DIAGNOSIS — R7989 Other specified abnormal findings of blood chemistry: Secondary | ICD-10-CM | POA: Diagnosis not present

## 2018-05-08 DIAGNOSIS — F329 Major depressive disorder, single episode, unspecified: Secondary | ICD-10-CM

## 2018-05-08 DIAGNOSIS — Z23 Encounter for immunization: Secondary | ICD-10-CM

## 2018-05-08 DIAGNOSIS — E785 Hyperlipidemia, unspecified: Secondary | ICD-10-CM

## 2018-05-08 DIAGNOSIS — F419 Anxiety disorder, unspecified: Secondary | ICD-10-CM

## 2018-05-08 DIAGNOSIS — E559 Vitamin D deficiency, unspecified: Secondary | ICD-10-CM | POA: Diagnosis not present

## 2018-05-08 LAB — LIPID PANEL
CHOL/HDL RATIO: 3
Cholesterol: 152 mg/dL (ref 0–200)
HDL: 53.3 mg/dL (ref >39.00)
LDL Cholesterol: 80 mg/dL (ref 0–99)
NONHDL: 98.77
Triglycerides: 95 mg/dL (ref 0.0–149.0)
VLDL: 19 mg/dL (ref 0.0–40.0)

## 2018-05-08 MED ORDER — CITALOPRAM HYDROBROMIDE 40 MG PO TABS: 40.0000 mg | ORAL_TABLET | Freq: Every day | ORAL | 6 refills | Status: DC

## 2018-05-08 MED ORDER — PANTOPRAZOLE SODIUM 20 MG PO TBEC: 20.0000 mg | DELAYED_RELEASE_TABLET | Freq: Every day | ORAL | 6 refills | Status: DC

## 2018-05-08 MED ORDER — ALPRAZOLAM 0.25 MG PO TABS: 0.2500 mg | ORAL_TABLET | Freq: Every evening | ORAL | 1 refills | Status: DC | PRN

## 2018-05-08 MED ORDER — AMLODIPINE BESYLATE 10 MG PO TABS: 10.0000 mg | ORAL_TABLET | Freq: Every day | ORAL | 6 refills | Status: DC

## 2018-05-08 NOTE — Progress Notes (Signed)
Pre visit review using our clinic review tool, if applicable. No additional management support is needed unless otherwise documented below in the visit note. 

## 2018-05-08 NOTE — Progress Notes (Signed)
Subjective:    Patient ID: [name], female    DOB: 03-04-1946, 72 y.o.   MRN: [id number]  DOS:  05/08/2018 Type of visit - description : rov Interval history: In the last few months, she has been under a lot of stress, her husband has dementia, had a fall and a intracranial bleeding.   Review of Systems Occasionally feels a slightly off balance when walking.  Denies dizziness per se or spinning. No chest pain, difficulty breathing or palpitations No slurred speech, diplopia or focal deficits.  Past Medical History:  Diagnosis Date  . Anxiety and depression   . Arthritis   . Blood transfusion without reported diagnosis 1989   GB hemorrage   . Cataract    Bil/no surgery yet.  . Coccyx pain    Secondary to scar tissue from old pressure ulcer after back surgery, Dr. Brantley Copeland  . Coronary artery disease   . Depression 2010   panic attacks and depression  . Dizziness    severe: MRI chronic isch. changes and mastoiditis- saw Dr.Mundy(2007)  STATES SHE STILL HAS EPISODES- ESPECIALLY IF SHE GETS UP TOO QUICKLY AFTER LYING DOWN  . Eczema   . GERD (gastroesophageal reflux disease)   . Goiter   . Headache(784.0)   . Hyperlipidemia   . Hypertension   . Pain    PAIN LOWER BACK AND DOWN RIGHT LEG WITH NUMBNESS, TINGLING RT LEG AND FOOT--STENOSIS  . Partial tear of right rotator cuff    & labral tear  . S/P cardiac cath 2009   Revealing nonobstructive CAD w/ tubular, discrete 40% mid lesion in the circumflex; discrete 30% proximal lesion in the LAD; luminal irregularities, 35% mid lesion in RCA; and generic 40% mid lesion in the RCA. Medical treatment recommended.  . S/P cardiac cath 11/24/2014   Revealing nonobstructive CAD w/ tubular, discrete 40% mid lesion in the circumflex; discrete 30% proximal lesion in the LAD; luminal irregularities, 35% mid lesion in RCA; and generic 40% mid lesion in the RCA. Medical treatment recommended.  . Stroke [facility]) 2004   mild TIA    Past  Surgical History:  Procedure Laterality Date  . ABDOMINAL HYSTERECTOMY     oophorectomy L only  . BUNIONECTOMY  1990s   LEFT  . CHOLECYSTECTOMY    . HAND SURGERY  1990s   R hand x 2, L hand x 1  . LUMBAR LAMINECTOMY/DECOMPRESSION MICRODISCECTOMY N/A 11/13/2012   Procedure: MICRO LUMBAR DECOMPRESSION L4 - L5 AND L2 - L3 2 LEVELS;  Surgeon: Johnn Hai, MD;  Location: [facility];  Service: Orthopedics;  Laterality: N/A;  . SHOULDER SURGERY  2006   left     Social History   Socioeconomic History  . Marital status: Married    Spouse name: Not on file  . Number of children: 4  . Years of education: Not on file  . Highest education level: Not on file  Occupational History  . Occupation: retired     Fish farm manager: RETIRED  Social Needs  . Financial resource strain: Not on file  . Food insecurity:    Worry: Not on file    Inability: Not on file  . Transportation needs:    Medical: Not on file    Non-medical: Not on file  Tobacco Use  . Smoking status: Former Smoker    Last attempt to quit: 2000    Years since quitting: 19.8  . Smokeless tobacco: Never Used  . Tobacco comment: quit aprox 2000 after  30 years, 1 to 1.5 ppd  Substance and Sexual Activity  . Alcohol use: No  . Drug use: No  . Sexual activity: Not Currently  Lifestyle  . Physical activity:    Days per week: Not on file    Minutes per session: Not on file  . Stress: Not on file  Relationships  . Social connections:    Talks on phone: Not on file    Gets together: Not on file    Attends religious service: Not on file    Active member of club or organization: Not on file    Attends meetings of clubs or organizations: Not on file    Relationship status: Not on file  . Intimate partner violence:    Fear of current or ex partner: Not on file    Emotionally abused: Not on file    Physically abused: Not on file    Forced sexual activity: Not on file  Other Topics Concern  . Not on file  Social History Narrative    Lives w/ husband [name]); youngest son lives w/ them   [name] has liver Ca, goes to the [place], to have XRT 07-2017   7 Gk, 8 GGk      Allergies as of 05/08/2018      Reactions   Codeine Other (See Comments)     chest and stomach pain, severe abd cramping   Hydrocodone    Whelts all over/swelling in lips   Sertraline Hcl Other (See Comments)    muscle aches, diarrhea, nausea      Medication List        Accurate as of 05/08/18 11:59 PM. Always use your most recent med list.          ALPRAZolam 0.25 MG tablet Commonly known as:  XANAX Take 1-2 tablets (0.25-0.5 mg total) by mouth at bedtime as needed for anxiety or sleep.   amLODipine 10 MG tablet Commonly known as:  NORVASC Take 1 tablet (10 mg total) by mouth daily.   aspirin 81 MG tablet Take 81 mg by mouth every morning.   celecoxib 200 MG capsule Commonly known as:  CELEBREX Take 1 capsule (200 mg total) by mouth daily as needed.   citalopram 40 MG tablet Commonly known as:  CELEXA Take 1 tablet (40 mg total) by mouth daily.   pantoprazole 20 MG tablet Commonly known as:  PROTONIX Take 1 tablet (20 mg total) by mouth daily before breakfast.   ranitidine 300 MG tablet Commonly known as:  ZANTAC Take 1 tablet (300 mg total) at bedtime by mouth.   simvastatin 20 MG tablet Commonly known as:  ZOCOR Take 1 tablet (20 mg total) by mouth at bedtime.   traMADol 50 MG tablet Commonly known as:  ULTRAM Take 50 mg by mouth 3 (three) times daily as needed for pain.          Objective:   Physical Exam BP 132/76 (BP Location: Left Arm, Patient Position: Sitting, Cuff Size: Small)   Pulse 73   Temp 98.3 F (36.8 C) (Oral)   Resp 16   Ht 5' (1.524 m)   Wt 182 lb 4 oz (82.7 kg)   SpO2 97%   BMI 35.59 kg/m  General:   Well developed, NAD, see BMI.  HEENT:  Normocephalic . Face symmetric, atraumatic Lungs:  CTA B Normal respiratory effort, no intercostal retractions, no accessory muscle use. Heart: RRR,  no  murmur.  No pretibial edema bilaterally  Skin: Not  pale. Not jaundice Neurologic:  alert & oriented X3.  Speech normal, gait appropriate for age and unassisted Psych--  Cognition and judgment appear intact.  Cooperative with normal attention span and concentration.  Behavior appropriate. No anxious or depressed appearing.      Assessment & Plan:   Assessment  Prediabetes HTN Hyperlipidemia Anxiety depression, insomnia- xanax rx by pcp GERD CAD: catheterization 2009 and 2016: Non-obstructive CAD, 40% blockages, see report. rx medical treatment Goiter: Last ultrasound 12-2014, nodules decrease in size MSK:  --Back - coccyxt pain, lumbar surgery 2014, chronic residual pain --On prn celebrex --on Tramadol (rx by back surgeon)   Vit d def Chronic Dizziness Eczema/psoriasis , sees derm  PLAN: Hyperlipidemia: On simvastatin, recent LFTs normal, check a FLP Anxiety, depression, insomnia: Under a lot of stress due to her husband illness, he has dementia, listening therapy provided, information about caring for the memory impaired provided.  I emphasized the need to keep some time for herself to prevent burnout  Imbalance: see HPI, consider a cane; ROS w/ no red flags High vitamin D:  stopped taking supplements since last OV, will check another level. Social: Completely independent, household: patient, her husband and her son Truman Hayward. Preventive care: Flu shot today RTC 10-2018 CPX   Today, I spent more than   25 min with the patient: >50% of the time counseling regards anxiety management in the context of taking care of her heel has been, risk of provider burnout discussed.  Pointers provided.

## 2018-05-08 NOTE — Patient Instructions (Addendum)
  GO TO THE LAB : Get the blood work     GO TO THE FRONT DESK Schedule your next appointment for a  Physical exam by [facility]   The 36-Hour Day a book by Eda Keys and Collier Salina Rabins is a detailed self-help guide for people caring for loved ones with Alzheimer's disease, dementia, and other memory impairments.  Web Resources : [employer]  http://rojas.com/  Local Resource: [facility]
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
## 2018-05-12 LAB — VITAMIN D 1,25 DIHYDROXY
Vitamin D 1, 25 (OH)2 Total: 114 pg/mL — ABNORMAL HIGH (ref 18–72)
Vitamin D2 1, 25 (OH)2: 34 pg/mL
Vitamin D3 1, 25 (OH)2: 80 pg/mL

## 2018-05-23 ENCOUNTER — Ambulatory Visit: Payer: Self-pay | Admitting: *Deleted

## 2018-05-23 DIAGNOSIS — K122 Cellulitis and abscess of mouth: Secondary | ICD-10-CM | POA: Diagnosis not present

## 2018-05-23 NOTE — Telephone Encounter (Signed)
Pt called in c/o having a pimple at the corner of the left side of her mouth.  She has been using peroxide on it but this morning she noticed it is swollen and pus is coming out of it.   It feels like there is a knot in there.   It's swollen from the corner of her mouth down to her chin just enough to notice it and it hurts.  There are no appts available at [facility] at [facility] so I have suggested she go to the urgent care near her.    She was agreeable to this plan.      Reason for Disposition . Looks like a boil or infected sore  Answer Assessment - Initial Assessment Questions 1. ONSET: "When did the swelling start?" (e.g., minutes, hours, days)     I have a pimple in  The corner of my mouth.  I got something out of it.   I was using peroxide on it.    This morning I noticed pus from it.   It's really swelling from the corner of my mouth to my chin.  It's on the left side. 2. LOCATION: "What part of the face is swollen?"     Corner of left mouth to chin. 3. SEVERITY: "How swollen is it?"     It's just enough to notice it. 4. ITCHING: "Is there any itching?" If so, ask: "How much?"   (Scale 1-10; mild, moderate or severe)     No inching. 5. PAIN: "Is the swelling painful to touch?" If so, ask: "How painful is it?"   (Scale 1-10; mild, moderate or severe)     It hurts.   It feel like there is a knot in there. 6. FEVER: "Do you have a fever?" If so, ask: "What is it, how was it measured, and when did it start?"      No 7. CAUSE: "What do you think is causing the face swelling?"     Pimple on the corner of my mouth.    8. RECURRENT SYMPTOM: "Have you had face swelling before?" If so, ask: "When was the last time?" "What happened that time?"     No 9. OTHER SYMPTOMS: "Do you have any other symptoms?" (e.g., toothache, leg swelling)     No pain inside my mouth 10. PREGNANCY: "Is there any chance you are pregnant?" "When was your last menstrual period?"       Not asked due to  age.  Protocols used: [facility]

## 2018-05-27 ENCOUNTER — Other Ambulatory Visit: Payer: Self-pay | Admitting: Internal Medicine

## 2018-06-03 DIAGNOSIS — Z1231 Encounter for screening mammogram for malignant neoplasm of breast: Secondary | ICD-10-CM | POA: Diagnosis not present

## 2018-06-03 LAB — HM MAMMOGRAPHY

## 2018-06-05 ENCOUNTER — Other Ambulatory Visit: Payer: Self-pay | Admitting: Internal Medicine

## 2018-08-22 ENCOUNTER — Encounter: Payer: Self-pay | Admitting: Family Medicine

## 2018-08-22 ENCOUNTER — Ambulatory Visit (INDEPENDENT_AMBULATORY_CARE_PROVIDER_SITE_OTHER): Payer: Medicare Other | Admitting: Family Medicine

## 2018-08-22 VITALS — BP 132/82 | HR 98 | Temp 98.2°F | Ht 61.0 in | Wt 182.5 lb

## 2018-08-22 DIAGNOSIS — J208 Acute bronchitis due to other specified organisms: Secondary | ICD-10-CM | POA: Diagnosis not present

## 2018-08-22 DIAGNOSIS — B9689 Other specified bacterial agents as the cause of diseases classified elsewhere: Secondary | ICD-10-CM

## 2018-08-22 MED ORDER — METHYLPREDNISOLONE ACETATE 80 MG/ML IJ SUSP
80.0000 mg | Freq: Once | INTRAMUSCULAR | Status: AC
  Administered 2018-08-22: 80 mg via INTRAMUSCULAR

## 2018-08-22 MED ORDER — FLUCONAZOLE 150 MG PO TABS: 150.0000 mg | ORAL_TABLET | Freq: Once | ORAL | 0 refills | Status: AC

## 2018-08-22 MED ORDER — DOXYCYCLINE HYCLATE 100 MG PO TABS: 100.0000 mg | ORAL_TABLET | Freq: Two times a day (BID) | ORAL | 0 refills | Status: DC

## 2018-08-22 MED ORDER — BENZONATATE 100 MG PO CAPS: 100.0000 mg | ORAL_CAPSULE | Freq: Three times a day (TID) | ORAL | 0 refills | Status: DC | PRN

## 2018-08-22 NOTE — Progress Notes (Signed)
Chief Complaint  Patient presents with  . Cough  . Back Pain    [name] here for URI complaints.  Duration: 6 days  Associated symptoms: rhinorrhea, itchy watery eyes, wheezing, shortness of breath, shaking, and mainly dry cough Denies: sinus congestion, sinus pain, ear pain, ear drainage, sore throat, myalgia and fevers Treatment to date: Mucinex, OTC cough syrup, cough drops Sick contacts: Yes  ROS:  Const: Denies fevers HEENT: As noted in HPI Lungs: +SOB  Past Medical History:  Diagnosis Date  . Anxiety and depression   . Arthritis   . Blood transfusion without reported diagnosis 1989   GB hemorrage   . Cataract    Bil/no surgery yet.  . Coccyx pain    Secondary to scar tissue from old pressure ulcer after back surgery, Dr. Brantley Stage  . Coronary artery disease   . Depression 2010   panic attacks and depression  . Dizziness    severe: MRI chronic isch. changes and mastoiditis- saw Dr.Mundy(2007)  STATES SHE STILL HAS EPISODES- ESPECIALLY IF SHE GETS UP TOO QUICKLY AFTER LYING DOWN  . Eczema   . GERD (gastroesophageal reflux disease)   . Goiter   . Headache(784.0)   . Hyperlipidemia   . Hypertension   . Pain    PAIN LOWER BACK AND DOWN RIGHT LEG WITH NUMBNESS, TINGLING RT LEG AND FOOT--STENOSIS  . Partial tear of right rotator cuff    & labral tear  . S/P cardiac cath 2009   Revealing nonobstructive CAD w/ tubular, discrete 40% mid lesion in the circumflex; discrete 30% proximal lesion in the LAD; luminal irregularities, 35% mid lesion in RCA; and generic 40% mid lesion in the RCA. Medical treatment recommended.  . S/P cardiac cath 11/24/2014   Revealing nonobstructive CAD w/ tubular, discrete 40% mid lesion in the circumflex; discrete 30% proximal lesion in the LAD; luminal irregularities, 35% mid lesion in RCA; and generic 40% mid lesion in the RCA. Medical treatment recommended.  . Stroke ([place]) 2004   mild TIA    BP 132/82 (BP Location: Left Arm,  Patient Position: Sitting, Cuff Size: Normal)   Pulse 98   Temp 98.2 F (36.8 C) (Oral)   Ht 5\' 1"  (1.549 m)   Wt 182 lb 8 oz (82.8 kg)   SpO2 98%   BMI 34.48 kg/m  General: Awake, alert, appears stated age HEENT: AT, [place], ears patent b/l and TM's neg, nares patent w/o discharge, pharynx pink and without exudates, MMM Neck: No masses or asymmetry Heart: RRR Lungs: CTAB, no accessory muscle use Neuro: +resting tremor of head/neck Psych: Age appropriate judgment and insight, normal mood and affect  Acute bacterial bronchitis - Plan: doxycycline (VIBRA-TABS) 100 MG tablet, benzonatate (TESSALON) 100 MG capsule, fluconazole (DIFLUCAN) 150 MG tablet  Orders as above. IM Depo. Due to shaking, will call in abx. If no improvement, will take in 1-2 days. Diflucan for  Continue to push fluids, practice good hand hygiene, cover mouth when coughing. F/u prn. If starting to experience fevers, worsening shaking, or shortness of breath, seek immediate care. Pt voiced understanding and agreement to the plan.  [place], DO 08/22/18 1:56 PM

## 2018-08-22 NOTE — Progress Notes (Signed)
Pre visit review using our clinic review tool, if applicable. No additional management support is needed unless otherwise documented below in the visit note. 

## 2018-08-22 NOTE — Patient Instructions (Addendum)
Continue to push fluids, practice good hand hygiene, and cover your mouth if you cough.  If you start having fevers, shaking or shortness of breath, seek immediate care.  Wait a couple days prior to taking the doxycycline and only if you are not getting better.  For symptoms, consider using Vick's VapoRub on chest or under nose, air humidifier, Benadryl at night, and elevating the head of the bed. Tylenol for aches and pains you may be experiencing.   Let us know if you need anything.

## 2018-08-22 NOTE — Addendum Note (Signed)
Addended by: Sharon Seller B on: 08/22/2018 02:10 PM   Modules accepted: Orders

## 2018-10-17 ENCOUNTER — Telehealth: Payer: Self-pay | Admitting: Internal Medicine

## 2018-10-17 ENCOUNTER — Other Ambulatory Visit: Payer: Self-pay

## 2018-10-17 ENCOUNTER — Encounter: Payer: Self-pay | Admitting: Internal Medicine

## 2018-10-17 ENCOUNTER — Ambulatory Visit (INDEPENDENT_AMBULATORY_CARE_PROVIDER_SITE_OTHER): Payer: Medicare Other | Admitting: Internal Medicine

## 2018-10-17 DIAGNOSIS — F341 Dysthymic disorder: Secondary | ICD-10-CM

## 2018-10-17 DIAGNOSIS — M545 Low back pain, unspecified: Secondary | ICD-10-CM

## 2018-10-17 DIAGNOSIS — F5101 Primary insomnia: Secondary | ICD-10-CM

## 2018-10-17 MED ORDER — CYCLOBENZAPRINE HCL 10 MG PO TABS: 10.0000 mg | ORAL_TABLET | Freq: Two times a day (BID) | ORAL | 0 refills | Status: DC | PRN

## 2018-10-17 MED ORDER — ALPRAZOLAM 0.25 MG PO TABS: 0.2500 mg | ORAL_TABLET | Freq: Every evening | ORAL | 1 refills | Status: DC | PRN
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
## 2018-10-17 NOTE — Telephone Encounter (Signed)
Spoke w/ Pt- telephone visit scheduled for 2:30pm.
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
## 2018-11-07 ENCOUNTER — Other Ambulatory Visit: Payer: Self-pay

## 2018-11-07 ENCOUNTER — Ambulatory Visit (INDEPENDENT_AMBULATORY_CARE_PROVIDER_SITE_OTHER): Payer: Medicare Other | Admitting: Internal Medicine

## 2018-11-07 DIAGNOSIS — F341 Dysthymic disorder: Secondary | ICD-10-CM | POA: Diagnosis not present

## 2018-11-07 DIAGNOSIS — M545 Low back pain, unspecified: Secondary | ICD-10-CM
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
## 2018-12-06 ENCOUNTER — Other Ambulatory Visit: Payer: Self-pay | Admitting: Internal Medicine

## 2018-12-08 ENCOUNTER — Telehealth: Payer: Self-pay

## 2018-12-08 MED ORDER — FAMOTIDINE 20 MG PO TABS: 20.0000 mg | ORAL_TABLET | Freq: Two times a day (BID) | ORAL | 1 refills | Status: DC

## 2018-12-08 NOTE — Telephone Encounter (Signed)
Famotidine 20mg  bid sent to pharmacy. Ranitidine d/c from med list.

## 2018-12-08 NOTE — Telephone Encounter (Signed)
Ranitidine recalled. Please advise.

## 2018-12-08 NOTE — Telephone Encounter (Signed)
Switch to famotidine 20 mg twice a day #60 6 refills for #90, 1 refill

## 2018-12-23 ENCOUNTER — Other Ambulatory Visit: Payer: Self-pay | Admitting: Internal Medicine

## 2019-03-02 ENCOUNTER — Other Ambulatory Visit: Payer: Self-pay | Admitting: Internal Medicine

## 2019-03-21 ENCOUNTER — Other Ambulatory Visit: Payer: Self-pay | Admitting: Internal Medicine

## 2019-03-23 ENCOUNTER — Telehealth: Payer: Self-pay | Admitting: Internal Medicine

## 2019-03-23 ENCOUNTER — Other Ambulatory Visit: Payer: Self-pay | Admitting: Internal Medicine

## 2019-03-23 NOTE — Telephone Encounter (Signed)
Called pt left msg on home machine to call back to schedule an Overdue CPE Per Dr. Larose Kells

## 2019-03-23 NOTE — Telephone Encounter (Signed)
-----   Message from Colon Branch, MD sent at 03/23/2019  8:00 AM EDT ----- Regarding: overdue fro CPX, please call and arrange

## 2019-03-24 NOTE — Telephone Encounter (Signed)
Pt scheduled for 04/28/19 for CPE.

## 2019-03-31 ENCOUNTER — Other Ambulatory Visit: Payer: Self-pay | Admitting: Internal Medicine

## 2019-04-21 ENCOUNTER — Other Ambulatory Visit: Payer: Self-pay | Admitting: Internal Medicine

## 2019-04-27 ENCOUNTER — Encounter: Payer: Self-pay | Admitting: Internal Medicine

## 2019-04-27 ENCOUNTER — Other Ambulatory Visit: Payer: Self-pay

## 2019-04-28 ENCOUNTER — Other Ambulatory Visit: Payer: Self-pay

## 2019-04-28 ENCOUNTER — Telehealth: Payer: Self-pay | Admitting: Internal Medicine

## 2019-04-28 ENCOUNTER — Ambulatory Visit (INDEPENDENT_AMBULATORY_CARE_PROVIDER_SITE_OTHER): Payer: Medicare Other | Admitting: Internal Medicine

## 2019-04-28 ENCOUNTER — Encounter: Payer: Self-pay | Admitting: Internal Medicine

## 2019-04-28 VITALS — BP 137/67 | HR 82 | Temp 97.8°F | Resp 16 | Ht 61.0 in | Wt 184.5 lb

## 2019-04-28 DIAGNOSIS — Z23 Encounter for immunization: Secondary | ICD-10-CM

## 2019-04-28 DIAGNOSIS — Z0000 Encounter for general adult medical examination without abnormal findings: Secondary | ICD-10-CM

## 2019-04-28 DIAGNOSIS — Z79899 Other long term (current) drug therapy: Secondary | ICD-10-CM

## 2019-04-28 DIAGNOSIS — F329 Major depressive disorder, single episode, unspecified: Secondary | ICD-10-CM

## 2019-04-28 DIAGNOSIS — F419 Anxiety disorder, unspecified: Secondary | ICD-10-CM

## 2019-04-28 DIAGNOSIS — F5101 Primary insomnia: Secondary | ICD-10-CM

## 2019-04-28 DIAGNOSIS — R739 Hyperglycemia, unspecified: Secondary | ICD-10-CM | POA: Diagnosis not present

## 2019-04-28 DIAGNOSIS — F32A Depression, unspecified: Secondary | ICD-10-CM

## 2019-04-28 DIAGNOSIS — E785 Hyperlipidemia, unspecified: Secondary | ICD-10-CM

## 2019-04-28 LAB — COMPREHENSIVE METABOLIC PANEL
ALT: 31 U/L (ref 0–35)
AST: 35 U/L (ref 0–37)
Albumin: 4.4 g/dL (ref 3.5–5.2)
Alkaline Phosphatase: 94 U/L (ref 39–117)
BUN: 12 mg/dL (ref 6–23)
CO2: 27 mEq/L (ref 19–32)
Calcium: 9.6 mg/dL (ref 8.4–10.5)
Chloride: 102 mEq/L (ref 96–112)
Creatinine, Ser: 0.71 mg/dL (ref 0.40–1.20)
GFR: 80.19 mL/min (ref >60.00)
Glucose, Bld: 109 mg/dL — ABNORMAL HIGH (ref 70–99)
Potassium: 4.4 mEq/L (ref 3.5–5.1)
Sodium: 138 mEq/L (ref 135–145)
Total Bilirubin: 0.5 mg/dL (ref 0.2–1.2)
Total Protein: 7 g/dL (ref 6.0–8.3)

## 2019-04-28 LAB — LIPID PANEL
Cholesterol: 144 mg/dL (ref 0–200)
HDL: 47.7 mg/dL (ref >39.00)
LDL Cholesterol: 76 mg/dL (ref 0–99)
NonHDL: 96.42
Total CHOL/HDL Ratio: 3
Triglycerides: 102 mg/dL (ref 0.0–149.0)
VLDL: 20.4 mg/dL (ref 0.0–40.0)

## 2019-04-28 LAB — CBC WITH DIFFERENTIAL/PLATELET
Basophils Absolute: 0 10*3/uL (ref 0.0–0.1)
Basophils Relative: 0.4 % (ref 0.0–3.0)
Eosinophils Absolute: 0.1 10*3/uL (ref 0.0–0.7)
Eosinophils Relative: 1 % (ref 0.0–5.0)
HCT: 45.6 % (ref 36.0–46.0)
Hemoglobin: 15.2 g/dL — ABNORMAL HIGH (ref 12.0–15.0)
Lymphocytes Relative: 31.2 % (ref 12.0–46.0)
Lymphs Abs: 2.5 10*3/uL (ref 0.7–4.0)
MCHC: 33.2 g/dL (ref 30.0–36.0)
MCV: 92.3 fl (ref 78.0–100.0)
Monocytes Absolute: 0.7 10*3/uL (ref 0.1–1.0)
Monocytes Relative: 8.3 % (ref 3.0–12.0)
Neutro Abs: 4.8 10*3/uL (ref 1.4–7.7)
Neutrophils Relative %: 59.1 % (ref 43.0–77.0)
Platelets: 190 10*3/uL (ref 150.0–400.0)
RBC: 4.94 Mil/uL (ref 3.87–5.11)
RDW: 14.1 % (ref 11.5–15.5)
WBC: 8.1 10*3/uL (ref 4.0–10.5)

## 2019-04-28 LAB — HEMOGLOBIN A1C: Hgb A1c MFr Bld: 6.3 % (ref 4.6–6.5)

## 2019-04-28 MED ORDER — SHINGRIX 50 MCG/0.5ML IM SUSR: 0.5000 mL | Freq: Once | INTRAMUSCULAR | 1 refills | Status: AC

## 2019-04-28 NOTE — Patient Instructions (Addendum)
Please schedule Medicare Wellness with Glenard Haring.   GO TO THE LAB : Get the blood work     GO TO THE FRONT DESK Schedule your next appointment   for a checkup in 6 months   Check the  blood pressure 2 times a month  BP GOAL is between 110/65 and  135/85. If it is consistently higher or lower, let me know

## 2019-04-28 NOTE — Telephone Encounter (Signed)
Patient was seen today and to inform PCP what medication she cut back on citalopram (CELEXA) 40 MG tablet  the 40MG  was making patient feel nausea, patient cut back to 20 MG

## 2019-04-28 NOTE — Progress Notes (Signed)
Subjective:    Patient ID: [name], female    DOB: [birth year], 73 y.o.   MRN: [id number]  DOS:  04/28/2019 Type of visit - description: CPX Since the last office visit she lost her husband. Has been under a lot of stress dealing with her loss and also paperwork &  finances, fortunately her children are very supportive. She is now doing well emotionally.  Review of Systems  Other than above, a 14 point review of systems is negative    Past Medical History:  Diagnosis Date  . Anxiety and depression   . Arthritis   . Blood transfusion without reported diagnosis 1989   GB hemorrage   . Cataract    Bil/no surgery yet.  . Coccyx pain    Secondary to scar tissue from old pressure ulcer after back surgery, Dr. Brantley Stage  . Coronary artery disease   . Depression 2010   panic attacks and depression  . Dizziness    severe: MRI chronic isch. changes and mastoiditis- saw Dr.Mundy(2007)  STATES SHE STILL HAS EPISODES- ESPECIALLY IF SHE GETS UP TOO QUICKLY AFTER LYING DOWN  . Eczema   . GERD (gastroesophageal reflux disease)   . Goiter   . Headache(784.0)   . Hyperlipidemia   . Hypertension   . Pain    PAIN LOWER BACK AND DOWN RIGHT LEG WITH NUMBNESS, TINGLING RT LEG AND FOOT--STENOSIS  . Partial tear of right rotator cuff    & labral tear  . S/P cardiac cath 2009   Revealing nonobstructive CAD w/ tubular, discrete 40% mid lesion in the circumflex; discrete 30% proximal lesion in the LAD; luminal irregularities, 35% mid lesion in RCA; and generic 40% mid lesion in the RCA. Medical treatment recommended.  . S/P cardiac cath 11/24/2014   Revealing nonobstructive CAD w/ tubular, discrete 40% mid lesion in the circumflex; discrete 30% proximal lesion in the LAD; luminal irregularities, 35% mid lesion in RCA; and generic 40% mid lesion in the RCA. Medical treatment recommended.  . Stroke [facility]) 2004   mild TIA    Past Surgical History:  Procedure Laterality Date  . ABDOMINAL  HYSTERECTOMY     oophorectomy L only  . BUNIONECTOMY  1990s   LEFT  . CHOLECYSTECTOMY    . HAND SURGERY  1990s   R hand x 2, L hand x 1  . LUMBAR LAMINECTOMY/DECOMPRESSION MICRODISCECTOMY N/A 11/13/2012   Procedure: MICRO LUMBAR DECOMPRESSION L4 - L5 AND L2 - L3 2 LEVELS;  Surgeon: Johnn Hai, MD;  Location: [facility];  Service: Orthopedics;  Laterality: N/A;  . SHOULDER SURGERY  2006   left     Social History   Socioeconomic History  . Marital status: Widowed    Spouse name: Not on file  . Number of children: 4  . Years of education: Not on file  . Highest education level: Not on file  Occupational History  . Occupation: retired     Fish farm manager: RETIRED  Social Needs  . Financial resource strain: Not on file  . Food insecurity    Worry: Not on file    Inability: Not on file  . Transportation needs    Medical: Not on file    Non-medical: Not on file  Tobacco Use  . Smoking status: Former Smoker    Quit date: 2000    Years since quitting: 20.7  . Smokeless tobacco: Never Used  . Tobacco comment: quit aprox 2000 after 30 years, 1 to 1.5 ppd  Substance and Sexual Activity  . Alcohol use: No  . Drug use: No  . Sexual activity: Not Currently  Lifestyle  . Physical activity    Days per week: Not on file    Minutes per session: Not on file  . Stress: Not on file  Relationships  . Social Herbalist on phone: Not on file    Gets together: Not on file    Attends religious service: Not on file    Active member of club or organization: Not on file    Attends meetings of clubs or organizations: Not on file    Relationship status: Not on file  . Intimate partner violence    Fear of current or ex partner: Not on file    Emotionally abused: Not on file    Physically abused: Not on file    Forced sexual activity: Not on file  Other Topics Concern  . Not on file  Social History Narrative   Lost husband [name]) 12/2018; 2 daughters. 2 sons    youngest son lives  w/her      Family History  Problem Relation Age of Onset  . Breast cancer Other        2 cousins  . Throat cancer Other        cousin  . Heart attack Brother        2 brother s, CABG  . Heart disease Brother   . Lung cancer Mother        cousin  . Lung cancer Brother   . Throat cancer Brother   . Cataracts Father   . Heart disease Brother   . Colon cancer Neg Hx   . Diabetes Neg Hx      Allergies as of 04/28/2019      Reactions   Codeine Other (See Comments)     chest and stomach pain, severe abd cramping   Hydrocodone    Whelts all over/swelling in lips   Sertraline Hcl Other (See Comments)    muscle aches, diarrhea, nausea      Medication List       Accurate as of April 28, 2019 11:59 PM. If you have any questions, ask your nurse or doctor.        ALPRAZolam 0.25 MG tablet Commonly known as: XANAX Take 1-2 tablets (0.25-0.5 mg total) by mouth at bedtime as needed for anxiety or sleep.   amLODipine 10 MG tablet Commonly known as: NORVASC Take 1 tablet (10 mg total) by mouth daily.   aspirin 81 MG tablet Take 81 mg by mouth every morning.   celecoxib 200 MG capsule Commonly known as: CELEBREX Take 1 capsule (200 mg total) by mouth daily as needed.   citalopram 40 MG tablet Commonly known as: CELEXA Take 0.5 tablets (20 mg total) by mouth daily. What changed: how much to take Changed by: Kathlene November, MD   cyclobenzaprine 10 MG tablet Commonly known as: FLEXERIL Take 1 tablet (10 mg total) by mouth 2 (two) times daily as needed for muscle spasms.   famotidine 20 MG tablet Commonly known as: Pepcid Take 1 tablet (20 mg total) by mouth 2 (two) times daily.   pantoprazole 20 MG tablet Commonly known as: Protonix Take 1 tablet (20 mg total) by mouth daily before breakfast.   Shingrix injection Generic drug: Zoster Vaccine Adjuvanted Inject 0.5 mLs into the muscle once for 1 dose. Started by: Kathlene November, MD   simvastatin 20 MG tablet Commonly  known  as: ZOCOR Take 1 tablet (20 mg total) by mouth at bedtime.   traMADol 50 MG tablet Commonly known as: ULTRAM Take 50 mg by mouth 3 (three) times daily as needed for pain.           Objective:   Physical Exam BP 137/67 (BP Location: Left Arm, Patient Position: Sitting, Cuff Size: Normal)   Pulse 82   Temp 97.8 F (36.6 C) (Temporal)   Resp 16   Ht 5\' 1"  (1.549 m)   Wt 184 lb 8 oz (83.7 kg)   SpO2 97%   BMI 34.86 kg/m  General: Well developed, NAD, BMI noted Neck: No  thyromegaly  HEENT:  Normocephalic . Face symmetric, atraumatic Lungs:  CTA B Normal respiratory effort, no intercostal retractions, no accessory muscle use. Heart: RRR,  no murmur.  No pretibial edema bilaterally  Abdomen:  Not distended, soft, non-tender. No rebound or rigidity.   Skin: Exposed areas without rash. Not pale. Not jaundice Neurologic:  alert & oriented X3.  Speech normal, gait appropriate for age and unassisted Strength symmetric and appropriate for age.  Psych: Cognition and judgment appear intact.  Cooperative with normal attention span and concentration.  Behavior appropriate. No anxious or depressed appearing.     Assessment     Assessment  Prediabetes HTN Hyperlipidemia Anxiety depression, insomnia- xanax rx by pcp GERD CAD: catheterization 2009 and 2016: Non-obstructive CAD, 40% blockages, see report. rx medical treatment Goiter: Last ultrasound 12-2014, nodules decrease in size MSK:  --Back - coccyxt pain, lumbar surgery 2014, chronic residual pain --On prn celebrex --on Tramadol (rx by back surgeon)   Vit d def Chronic Dizziness Eczema/psoriasis , sees derm  PLAN: Prediabetes: Check A1c HTN: Continue amlodipine, recommend to check ambulatory BPs. Hyperlipidemia: On simvastatin, checking labs Anxiety, depression, insomnia: Lost her husband few months ago, fortunately she is doing okay, has a lot of family support, continue Xanax as needed and citalopram, self  decreased citalopram from 40 mg to 20 mg due to GI side effects.  Check a UDS Back pain: Increasing lately, recommend to see Ortho, they are following her MSK issues Vitamin D deficiency: Encourage supplements regularly RTC 6 months

## 2019-04-28 NOTE — Telephone Encounter (Signed)
FYI

## 2019-04-28 NOTE — Assessment & Plan Note (Signed)
-   Td 2012  - pneumonia shot 2010 - prevnar--2015 - shingles shot 2013 -Shingrix discussed, prescription printed - Flu shot today -female care No recent PAP, will see gyn by 05-2019 ; MMG q year @ gyn, no records  DEXA 2016 T score -1.1, next 2021   --CCS: Cscope 2004, + polyps adenomatous colonoscopy  07-2009, no polyps.  + cologuard 2019, Cscope 01/2018, 2 polyps, next per GI  -Labs: CMP, FLP, CBC, A1c, UDS -Diet and exercise discussed.  She feels occasionally wobbly, recommend to stay healthy, we discussed possible PT, she declined

## 2019-04-28 NOTE — Progress Notes (Signed)
Pre visit review using our clinic review tool, if applicable. No additional management support is needed unless otherwise documented below in the visit note. 

## 2019-04-29 LAB — PAIN MGMT, PROFILE 8 W/CONF, U
6 Acetylmorphine: NEGATIVE ng/mL
Alcohol Metabolites: NEGATIVE ng/mL (ref <500)
Amphetamines: NEGATIVE ng/mL
Benzodiazepines: NEGATIVE ng/mL
Buprenorphine, Urine: NEGATIVE ng/mL
Cocaine Metabolite: NEGATIVE ng/mL
Creatinine: 70.8 mg/dL
MDMA: NEGATIVE ng/mL
Marijuana Metabolite: NEGATIVE ng/mL
Opiates: NEGATIVE ng/mL
Oxidant: NEGATIVE ug/mL
Oxycodone: NEGATIVE ng/mL
pH: 7.2 (ref 4.5–9.0)

## 2019-04-29 NOTE — Assessment & Plan Note (Signed)
Prediabetes: Check A1c HTN: Continue amlodipine, recommend to check ambulatory BPs. Hyperlipidemia: On simvastatin, checking labs Anxiety, depression, insomnia: Lost her husband few months ago, fortunately she is doing okay, has a lot of family support, continue Xanax as needed and citalopram, self decreased citalopram from 40 mg to 20 mg due to GI side effects.  Check a UDS Back pain: Increasing lately, recommend to see Ortho, they are following her MSK issues Vitamin D deficiency: Encourage supplements regularly RTC 6 months

## 2019-04-29 NOTE — Telephone Encounter (Signed)
Noted, thank you

## 2019-05-06 ENCOUNTER — Other Ambulatory Visit: Payer: Self-pay | Admitting: Internal Medicine

## 2019-05-06 MED ORDER — AMLODIPINE BESYLATE 10 MG PO TABS: 10.0000 mg | ORAL_TABLET | Freq: Every day | ORAL | 2 refills | Status: DC

## 2019-05-14 DIAGNOSIS — M545 Low back pain: Secondary | ICD-10-CM | POA: Diagnosis not present

## 2019-05-14 DIAGNOSIS — M5416 Radiculopathy, lumbar region: Secondary | ICD-10-CM | POA: Diagnosis not present

## 2019-05-14 DIAGNOSIS — M5136 Other intervertebral disc degeneration, lumbar region: Secondary | ICD-10-CM | POA: Diagnosis not present

## 2019-05-14 DIAGNOSIS — M419 Scoliosis, unspecified: Secondary | ICD-10-CM | POA: Diagnosis not present

## 2019-06-06 ENCOUNTER — Other Ambulatory Visit: Payer: Self-pay | Admitting: Internal Medicine

## 2019-06-13 DIAGNOSIS — M545 Low back pain: Secondary | ICD-10-CM | POA: Diagnosis not present

## 2019-06-22 DIAGNOSIS — M419 Scoliosis, unspecified: Secondary | ICD-10-CM | POA: Diagnosis not present

## 2019-06-22 DIAGNOSIS — M5136 Other intervertebral disc degeneration, lumbar region: Secondary | ICD-10-CM | POA: Diagnosis not present

## 2019-06-22 DIAGNOSIS — M545 Low back pain: Secondary | ICD-10-CM | POA: Diagnosis not present

## 2019-06-22 DIAGNOSIS — M48062 Spinal stenosis, lumbar region with neurogenic claudication: Secondary | ICD-10-CM | POA: Diagnosis not present

## 2019-06-22 NOTE — Progress Notes (Signed)
Virtual Visit via Video Note  I connected with patient on 06/23/19 at 11:00 AM EST by audio enabled telemedicine application and verified that I am speaking with the correct person using two identifiers.   THIS ENCOUNTER IS A VIRTUAL VISIT DUE TO COVID-19 - PATIENT WAS NOT SEEN IN THE OFFICE. PATIENT HAS CONSENTED TO VIRTUAL VISIT / TELEMEDICINE VISIT   Location of patient: home  Location of provider: office  I discussed the limitations of evaluation and management by telemedicine and the availability of in person appointments. The patient expressed understanding and agreed to proceed.   Subjective:   [name] is a 73 y.o. female who presents for Medicare Annual (Subsequent) preventive examination.  Review of Systems:   Home Safety/Smoke Alarms: Feels safe in home. Smoke alarms in place.  Lives w/ youngest in 1 story home. Verbalizes a good support system.   Female:       Mammo- ordered        Dexa scan- ordered [facility]- 02/04/18. Recall 5 yrs    Objective:     Vitals: Unable to assess. This visit is enabled though telemedicine due to Covid 19.   Advanced Directives 06/23/2019 10/25/2017 12/06/2014 11/14/2012 10/30/2012  Does Patient Have a Medical Advance Directive? No No No Patient does not have advance directive;Patient would like information Patient does not have advance directive;Patient would like information  Would patient like information on creating a medical advance directive? No - Patient declined Yes (MAU/Ambulatory/Procedural Areas - Information given) - - Advance directive packet given    Tobacco Social History   Tobacco Use  Smoking Status Former Smoker  . Quit date: 2000  . Years since quitting: 20.9  Smokeless Tobacco Never Used  Tobacco Comment   quit aprox 2000 after 30 years, 1 to 1.5 ppd     Counseling given: Not Answered Comment: quit aprox 2000 after 30 years, 1 to 1.5 ppd   Clinical Intake:     Pain : 0-10 Pain Score: 5  Pain Type:  Chronic pain Pain Location: Back Pain Descriptors / Indicators: Discomfort Pain Onset: More than a month ago Pain Frequency: Constant Pain Relieving Factors: medications  Pain Relieving Factors: medications              Past Medical History:  Diagnosis Date  . Anxiety and depression   . Arthritis   . Blood transfusion without reported diagnosis 1989   GB hemorrage   . Cataract    Bil/no surgery yet.  . Coccyx pain    Secondary to scar tissue from old pressure ulcer after back surgery, Dr. Brantley Stage  . Coronary artery disease   . Depression 2010   panic attacks and depression  . Dizziness    severe: MRI chronic isch. changes and mastoiditis- saw Dr.Mundy(2007)  STATES SHE STILL HAS EPISODES- ESPECIALLY IF SHE GETS UP TOO QUICKLY AFTER LYING DOWN  . Eczema   . GERD (gastroesophageal reflux disease)   . Goiter   . Headache(784.0)   . Hyperlipidemia   . Hypertension   . Pain    PAIN LOWER BACK AND DOWN RIGHT LEG WITH NUMBNESS, TINGLING RT LEG AND FOOT--STENOSIS  . Partial tear of right rotator cuff    & labral tear  . S/P cardiac cath 2009   Revealing nonobstructive CAD w/ tubular, discrete 40% mid lesion in the circumflex; discrete 30% proximal lesion in the LAD; luminal irregularities, 35% mid lesion in RCA; and generic 40% mid lesion in the RCA. Medical treatment recommended.  Marland Kitchen  S/P cardiac cath 11/24/2014   Revealing nonobstructive CAD w/ tubular, discrete 40% mid lesion in the circumflex; discrete 30% proximal lesion in the LAD; luminal irregularities, 35% mid lesion in RCA; and generic 40% mid lesion in the RCA. Medical treatment recommended.  . Stroke [facility]) 2004   mild TIA   Past Surgical History:  Procedure Laterality Date  . ABDOMINAL HYSTERECTOMY     oophorectomy L only  . BUNIONECTOMY  1990s   LEFT  . CHOLECYSTECTOMY    . HAND SURGERY  1990s   R hand x 2, L hand x 1  . LUMBAR LAMINECTOMY/DECOMPRESSION MICRODISCECTOMY N/A 11/13/2012   Procedure: MICRO  LUMBAR DECOMPRESSION L4 - L5 AND L2 - L3 2 LEVELS;  Surgeon: Johnn Hai, MD;  Location: [facility];  Service: Orthopedics;  Laterality: N/A;  . SHOULDER SURGERY  2006   left    Family History  Problem Relation Age of Onset  . Breast cancer Other        2 cousins  . Throat cancer Other        cousin  . Heart attack Brother        2 brother s, CABG  . Heart disease Brother   . Lung cancer Mother        cousin  . Lung cancer Brother   . Throat cancer Brother   . Cataracts Father   . Heart disease Brother   . Colon cancer Neg Hx   . Diabetes Neg Hx    Social History   Socioeconomic History  . Marital status: Widowed    Spouse name: Not on file  . Number of children: 4  . Years of education: Not on file  . Highest education level: Not on file  Occupational History  . Occupation: retired     Fish farm manager: RETIRED  Social Needs  . Financial resource strain: Not on file  . Food insecurity    Worry: Not on file    Inability: Not on file  . Transportation needs    Medical: Not on file    Non-medical: Not on file  Tobacco Use  . Smoking status: Former Smoker    Quit date: 2000    Years since quitting: 20.9  . Smokeless tobacco: Never Used  . Tobacco comment: quit aprox 2000 after 30 years, 1 to 1.5 ppd  Substance and Sexual Activity  . Alcohol use: No  . Drug use: No  . Sexual activity: Not Currently  Lifestyle  . Physical activity    Days per week: Not on file    Minutes per session: Not on file  . Stress: Not on file  Relationships  . Social Herbalist on phone: Not on file    Gets together: Not on file    Attends religious service: Not on file    Active member of club or organization: Not on file    Attends meetings of clubs or organizations: Not on file    Relationship status: Not on file  Other Topics Concern  . Not on file  Social History Narrative   Lost husband [name]) 12/2018; 2 daughters. 2 sons    youngest son lives w/her     Outpatient  Encounter Medications as of 06/23/2019  Medication Sig  . ALPRAZolam (XANAX) 0.25 MG tablet Take 1-2 tablets (0.25-0.5 mg total) by mouth at bedtime as needed for anxiety or sleep.  Marland Kitchen amLODipine (NORVASC) 10 MG tablet Take 1 tablet (10 mg total) by mouth  daily.  . aspirin 81 MG tablet Take 81 mg by mouth every morning.   . citalopram (CELEXA) 40 MG tablet Take 0.5 tablets (20 mg total) by mouth daily.  . famotidine (PEPCID) 20 MG tablet Take 1 tablet (20 mg total) by mouth 2 (two) times daily.  . pantoprazole (PROTONIX) 20 MG tablet Take 1 tablet (20 mg total) by mouth daily before breakfast.  . simvastatin (ZOCOR) 20 MG tablet Take 1 tablet (20 mg total) by mouth at bedtime.  . traMADol (ULTRAM) 50 MG tablet Take 50 mg by mouth 3 (three) times daily as needed for pain.   . celecoxib (CELEBREX) 200 MG capsule Take 1 capsule (200 mg total) by mouth daily as needed. (Patient not taking: Reported on 04/28/2019)  . cyclobenzaprine (FLEXERIL) 10 MG tablet Take 1 tablet (10 mg total) by mouth 2 (two) times daily as needed for muscle spasms. (Patient not taking: Reported on 06/23/2019)   No facility-administered encounter medications on file as of 06/23/2019.     Activities of Daily Living In your present state of health, do you have any difficulty performing the following activities: 06/23/2019 04/28/2019  Hearing? N N  Vision? N N  Difficulty concentrating or making decisions? N N  Walking or climbing stairs? N N  Dressing or bathing? N N  Doing errands, shopping? N N  Preparing Food and eating ? N -  Using the Toilet? N -  In the past six months, have you accidently leaked urine? N -  Do you have problems with loss of bowel control? N -  Managing your Medications? N -  Managing your Finances? N -  Housekeeping or managing your Housekeeping? N -  Some recent data might be hidden    Patient Care Team: Colon Branch, MD as PCP - General [name], Nadara Mustard, MD as Consulting Physician (Gynecology)  Ladene Artist, MD as Consulting Physician (Gastroenterology) Erroll Luna, MD as Consulting Physician (General Surgery) Pedro Earls, MD as Attending Physician (Orthopedic Surgery) Netta Cedars, MD as Consulting Physician (Orthopedic Surgery) Linda Hedges, DO as Consulting Physician (Obstetrics and Gynecology)    Assessment:   This is a routine wellness examination for Alajiah. Physical assessment deferred to PCP.  Exercise Activities and Dietary recommendations Current Exercise Habits: The patient does not participate in regular exercise at present, Exercise limited by: None identified Diet (meal preparation, eat out, water intake, caffeinated beverages, dairy products, fruits and vegetables): 24 hr recall Breakfast: cereal Lunch: skipped Dinner: chicken nuggets and cherry pie Drinks a lot of diet coke. Needs to drink more water.   Goals    . Increase physical activity       Fall Risk Fall Risk  06/23/2019 04/28/2019 10/25/2017 03/13/2017 03/07/2016  Falls in the past year? 0 0 No No No  Follow up Education provided;Falls prevention discussed Falls evaluation completed - - -     Depression Screen PHQ 2/9 Scores 06/23/2019 04/28/2019 10/25/2017 03/13/2017  PHQ - 2 Score 1 4 0 2  PHQ- 9 Score 1 8 - 10     Cognitive Function Ad8 score reviewed for issues:  Issues making decisions:no  Less interest in hobbies / activities:no  Repeats questions, stories (family complaining):no  Trouble using ordinary gadgets (microwave, computer, phone):no  Forgets the month or year: no  Mismanaging finances: no  Remembering appts:no  Daily problems with thinking and/or memory:no Ad8 score is=0     MMSE - Mini Mental State Exam 10/25/2017  Orientation to time 5  Orientation  to Place 5  Registration 3  Attention/ Calculation 5  Recall 3  Language- name 2 objects 2  Language- repeat 1  Language- follow 3 step command 3  Language- read & follow direction 1  Write a sentence 1   Copy design 1  Total score 30        Immunization History  Administered Date(s) Administered  . Fluad Quad(high Dose 65+) 04/28/2019  . Influenza Whole 05/09/2007, 07/18/2009, 06/12/2010  . Influenza, High Dose Seasonal PF 06/01/2015, 05/23/2017, 05/08/2018  . Influenza, Seasonal, Injecte, Preservative Fre 08/01/2012  . Influenza,inj,Quad PF,6+ Mos 04/07/2014  . Pneumococcal Conjugate-13 12/03/2013  . Pneumococcal Polysaccharide-23 07/18/2009  . Tdap 12/20/2010  . Zoster 12/26/2011    Screening Tests Health Maintenance  Topic Date Due  . MAMMOGRAM  06/04/2019  . TETANUS/TDAP  12/19/2020  . Fecal DNA (Cologuard)  12/30/2020  . DEXA SCAN  03/19/2021  . INFLUENZA VACCINE  Completed  . Hepatitis C Screening  Completed  . PNA vac Low Risk Adult  Completed    Plan:   See you next year!  Continue to eat heart healthy diet (full of fruits, vegetables, whole grains, lean protein, water--limit salt, fat, and sugar intake) and increase physical activity as tolerated.  Continue doing brain stimulating activities (puzzles, reading, adult coloring books, staying active) to keep memory sharp.   Bring a copy of your living will and/or healthcare power of attorney to your next office visit.   I have personally reviewed and noted the following in the patient's chart:   . Medical and social history . Use of alcohol, tobacco or illicit drugs  . Current medications and supplements . Functional ability and status . Nutritional status . Physical activity . Advanced directives . List of other physicians . Hospitalizations, surgeries, and ER visits in previous 12 months . Vitals . Screenings to include cognitive, depression, and falls . Referrals and appointments  In addition, I have reviewed and discussed with patient certain preventive protocols, quality metrics, and best practice recommendations. A written personalized care plan for preventive services as well as general preventive  health recommendations were provided to patient.     Shela Nevin, [place]  06/23/2019

## 2019-06-23 ENCOUNTER — Ambulatory Visit (INDEPENDENT_AMBULATORY_CARE_PROVIDER_SITE_OTHER): Payer: Medicare Other | Admitting: *Deleted

## 2019-06-23 ENCOUNTER — Other Ambulatory Visit: Payer: Self-pay

## 2019-06-23 ENCOUNTER — Encounter: Payer: Self-pay | Admitting: *Deleted

## 2019-06-23 DIAGNOSIS — Z0000 Encounter for general adult medical examination without abnormal findings: Secondary | ICD-10-CM | POA: Diagnosis not present

## 2019-06-23 DIAGNOSIS — Z780 Asymptomatic menopausal state: Secondary | ICD-10-CM

## 2019-06-23 DIAGNOSIS — Z1231 Encounter for screening mammogram for malignant neoplasm of breast: Secondary | ICD-10-CM

## 2019-06-23 NOTE — Patient Instructions (Signed)
See you next year!  Continue to eat heart healthy diet (full of fruits, vegetables, whole grains, lean protein, water--limit salt, fat, and sugar intake) and increase physical activity as tolerated.  Continue doing brain stimulating activities (puzzles, reading, adult coloring books, staying active) to keep memory sharp.   Bring a copy of your living will and/or healthcare power of attorney to your next office visit.   [name] , Thank you for taking time to come for your Medicare Wellness Visit. I appreciate your ongoing commitment to your health goals. Please review the following plan we discussed and let me know if I can assist you in the future.   These are the goals we discussed: Goals    . DIET - INCREASE WATER INTAKE    . Increase physical activity       This is a list of the screening recommended for you and due dates:  Health Maintenance  Topic Date Due  . Mammogram  06/04/2019  . Tetanus Vaccine  12/19/2020  . Cologuard (Stool DNA test)  12/30/2020  . DEXA scan (bone density measurement)  03/19/2021  . Flu Shot  Completed  .  Hepatitis C: One time screening is recommended by Center for Disease Control  (CDC) for  adults born from [birth year] through 1967.   Completed  . Pneumonia vaccines  Completed    Preventive Care 80 Years and Older, Female Preventive care refers to lifestyle choices and visits with your health care provider that can promote health and wellness. This includes:  A yearly physical exam. This is also called an annual well check.  Regular dental and eye exams.  Immunizations.  Screening for certain conditions.  Healthy lifestyle choices, such as diet and exercise. What can I expect for my preventive care visit? Physical exam Your health care provider will check:  Height and weight. These may be used to calculate body mass index (BMI), which is a measurement that tells if you are at a healthy weight.  Heart rate and blood pressure.  Your skin  for abnormal spots. Counseling Your health care provider may ask you questions about:  Alcohol, tobacco, and drug use.  Emotional well-being.  Home and relationship well-being.  Sexual activity.  Eating habits.  History of falls.  Memory and ability to understand (cognition).  Work and work Statistician.  Pregnancy and menstrual history. What immunizations do I need?  Influenza (flu) vaccine  This is recommended every year. Tetanus, diphtheria, and pertussis (Tdap) vaccine  You may need a Td booster every 10 years. Varicella (chickenpox) vaccine  You may need this vaccine if you have not already been vaccinated. Zoster (shingles) vaccine  You may need this after age 24. Pneumococcal conjugate (PCV13) vaccine  One dose is recommended after age 54. Pneumococcal polysaccharide (PPSV23) vaccine  One dose is recommended after age 3. Measles, mumps, and rubella (MMR) vaccine  You may need at least one dose of MMR if you were born in 1957 or later. You may also need a second dose. Meningococcal conjugate (MenACWY) vaccine  You may need this if you have certain conditions. Hepatitis A vaccine  You may need this if you have certain conditions or if you travel or work in places where you may be exposed to hepatitis A. Hepatitis B vaccine  You may need this if you have certain conditions or if you travel or work in places where you may be exposed to hepatitis B. Haemophilus influenzae type b (Hib) vaccine  You may need  this if you have certain conditions. You may receive vaccines as individual doses or as more than one vaccine together in one shot (combination vaccines). Talk with your health care provider about the risks and benefits of combination vaccines. What tests do I need? Blood tests  Lipid and cholesterol levels. These may be checked every 5 years, or more frequently depending on your overall health.  Hepatitis C test.  Hepatitis B test. Screening   Lung cancer screening. You may have this screening every year starting at age 50 if you have a 30-pack-year history of smoking and currently smoke or have quit within the past 15 years.  Colorectal cancer screening. All adults should have this screening starting at age 21 and continuing until age 31. Your health care provider may recommend screening at age 33 if you are at increased risk. You will have tests every 1-10 years, depending on your results and the type of screening test.  Diabetes screening. This is done by checking your blood sugar (glucose) after you have not eaten for a while (fasting). You may have this done every 1-3 years.  Mammogram. This may be done every 1-2 years. Talk with your health care provider about how often you should have regular mammograms.  BRCA-related cancer screening. This may be done if you have a family history of breast, ovarian, tubal, or peritoneal cancers. Other tests  Sexually transmitted disease (STD) testing.  Bone density scan. This is done to screen for osteoporosis. You may have this done starting at age 57. Follow these instructions at home: Eating and drinking  Eat a diet that includes fresh fruits and vegetables, whole grains, lean protein, and low-fat dairy products. Limit your intake of foods with high amounts of sugar, saturated fats, and salt.  Take vitamin and mineral supplements as recommended by your health care provider.  Do not drink alcohol if your health care provider tells you not to drink.  If you drink alcohol: ? Limit how much you have to 0-1 drink a day. ? Be aware of how much alcohol is in your drink. In the [place] one drink equals one 12 oz bottle of beer (355 mL), one 5 oz glass of wine (148 mL), or one 1 oz glass of hard liquor (44 mL). Lifestyle  Take daily care of your teeth and gums.  Stay active. Exercise for at least 30 minutes on 5 or more days each week.  Do not use any products that contain nicotine or  tobacco, such as cigarettes, e-cigarettes, and chewing tobacco. If you need help quitting, ask your health care provider.  If you are sexually active, practice safe sex. Use a condom or other form of protection in order to prevent STIs (sexually transmitted infections).  Talk with your health care provider about taking a low-dose aspirin or statin. What's next?  Go to your health care provider once a year for a well check visit.  Ask your health care provider how often you should have your eyes and teeth checked.  Stay up to date on all vaccines. This information is not intended to replace advice given to you by your health care provider. Make sure you discuss any questions you have with your health care provider. Document Released: 08/05/2015 Document Revised: 07/03/2018 Document Reviewed: 07/03/2018 Elsevier Patient Education  2020 [employer].

## 2019-07-02 ENCOUNTER — Ambulatory Visit (HOSPITAL_BASED_OUTPATIENT_CLINIC_OR_DEPARTMENT_OTHER)
Admission: RE | Admit: 2019-07-02 | Discharge: 2019-07-02 | Disposition: A | Discharge status: Home / Self Care | Payer: Medicare Other | Source: Ambulatory Visit | Attending: Internal Medicine | Admitting: Internal Medicine

## 2019-07-02 ENCOUNTER — Encounter (HOSPITAL_BASED_OUTPATIENT_CLINIC_OR_DEPARTMENT_OTHER): Payer: Self-pay

## 2019-07-02 ENCOUNTER — Other Ambulatory Visit: Payer: Self-pay

## 2019-07-02 DIAGNOSIS — Z1231 Encounter for screening mammogram for malignant neoplasm of breast: Secondary | ICD-10-CM | POA: Diagnosis not present

## 2019-07-02 DIAGNOSIS — Z780 Asymptomatic menopausal state: Secondary | ICD-10-CM

## 2019-07-02 DIAGNOSIS — M8589 Other specified disorders of bone density and structure, multiple sites: Secondary | ICD-10-CM | POA: Diagnosis not present

## 2019-07-03 ENCOUNTER — Other Ambulatory Visit: Payer: Self-pay | Admitting: Internal Medicine

## 2019-07-03 DIAGNOSIS — R928 Other abnormal and inconclusive findings on diagnostic imaging of breast: Secondary | ICD-10-CM

## 2019-07-09 ENCOUNTER — Other Ambulatory Visit: Payer: Self-pay | Admitting: Internal Medicine

## 2019-07-09 ENCOUNTER — Ambulatory Visit
Admission: RE | Admit: 2019-07-09 | Discharge: 2019-07-09 | Disposition: A | Discharge status: Home / Self Care | Payer: Medicare Other | Source: Ambulatory Visit | Attending: Internal Medicine | Admitting: Internal Medicine

## 2019-07-09 ENCOUNTER — Other Ambulatory Visit: Payer: Self-pay

## 2019-07-09 DIAGNOSIS — R928 Other abnormal and inconclusive findings on diagnostic imaging of breast: Secondary | ICD-10-CM

## 2019-07-09 DIAGNOSIS — N6321 Unspecified lump in the left breast, upper outer quadrant: Secondary | ICD-10-CM | POA: Diagnosis not present

## 2019-07-09 DIAGNOSIS — N6320 Unspecified lump in the left breast, unspecified quadrant: Secondary | ICD-10-CM

## 2019-07-09 DIAGNOSIS — N6489 Other specified disorders of breast: Secondary | ICD-10-CM | POA: Diagnosis not present

## 2019-07-13 ENCOUNTER — Other Ambulatory Visit: Payer: Self-pay

## 2019-07-13 MED ORDER — CITALOPRAM HYDROBROMIDE 40 MG PO TABS: 20.0000 mg | ORAL_TABLET | Freq: Every day | ORAL | 1 refills | Status: DC

## 2019-07-19 ENCOUNTER — Other Ambulatory Visit: Payer: Self-pay | Admitting: Internal Medicine

## 2019-07-28 DIAGNOSIS — M545 Low back pain: Secondary | ICD-10-CM | POA: Diagnosis not present

## 2019-07-28 DIAGNOSIS — M419 Scoliosis, unspecified: Secondary | ICD-10-CM | POA: Diagnosis not present

## 2019-07-28 DIAGNOSIS — M48062 Spinal stenosis, lumbar region with neurogenic claudication: Secondary | ICD-10-CM | POA: Diagnosis not present

## 2019-07-28 DIAGNOSIS — M519 Unspecified thoracic, thoracolumbar and lumbosacral intervertebral disc disorder: Secondary | ICD-10-CM | POA: Diagnosis not present

## 2019-07-28 DIAGNOSIS — M5136 Other intervertebral disc degeneration, lumbar region: Secondary | ICD-10-CM | POA: Diagnosis not present

## 2019-10-27 ENCOUNTER — Ambulatory Visit (INDEPENDENT_AMBULATORY_CARE_PROVIDER_SITE_OTHER): Payer: Medicare Other | Admitting: Internal Medicine

## 2019-10-27 ENCOUNTER — Encounter: Payer: Self-pay | Admitting: Internal Medicine

## 2019-10-27 ENCOUNTER — Other Ambulatory Visit: Payer: Self-pay

## 2019-10-27 VITALS — BP 142/61 | HR 83 | Temp 98.1°F | Resp 18 | Ht 61.0 in | Wt 183.3 lb

## 2019-10-27 DIAGNOSIS — R739 Hyperglycemia, unspecified: Secondary | ICD-10-CM

## 2019-10-27 DIAGNOSIS — I10 Essential (primary) hypertension: Secondary | ICD-10-CM | POA: Diagnosis not present

## 2019-10-27 DIAGNOSIS — G8929 Other chronic pain: Secondary | ICD-10-CM

## 2019-10-27 DIAGNOSIS — M545 Low back pain: Secondary | ICD-10-CM

## 2019-10-27 LAB — BASIC METABOLIC PANEL
BUN: 10 mg/dL (ref 6–23)
CO2: 28 mEq/L (ref 19–32)
Calcium: 9.4 mg/dL (ref 8.4–10.5)
Chloride: 103 mEq/L (ref 96–112)
Creatinine, Ser: 0.66 mg/dL (ref 0.40–1.20)
GFR: 87.12 mL/min (ref >60.00)
Glucose, Bld: 111 mg/dL — ABNORMAL HIGH (ref 70–99)
Potassium: 4.2 mEq/L (ref 3.5–5.1)
Sodium: 140 mEq/L (ref 135–145)

## 2019-10-27 LAB — HEMOGLOBIN A1C: Hgb A1c MFr Bld: 5.8 % (ref 4.6–6.5)

## 2019-10-27 NOTE — Progress Notes (Signed)
Pre visit review using our clinic review tool, if applicable. No additional management support is needed unless otherwise documented below in the visit note. 

## 2019-10-27 NOTE — Progress Notes (Signed)
Subjective:    Patient ID: [name], female    DOB: Oct 06, 1945, 74 y.o.   MRN: [id number]  DOS:  10/27/2019 Type of visit - description: Routine visit Today we talk about back pain, hypertension, hyperglycemia, depression. In general feeling well.    Review of Systems Mood is good, improved compared to the last visit. Due to back pain, she is not able to exercise much but remains active going shopping etc.  Past Medical History:  Diagnosis Date  . Anxiety and depression   . Arthritis   . Blood transfusion without reported diagnosis 1989   GB hemorrage   . Cataract    Bil/no surgery yet.  . Coccyx pain    Secondary to scar tissue from old pressure ulcer after back surgery, Dr. Brantley Stage  . Coronary artery disease   . Depression 2010   panic attacks and depression  . Dizziness    severe: MRI chronic isch. changes and mastoiditis- saw Dr.Mundy(2007)  STATES SHE STILL HAS EPISODES- ESPECIALLY IF SHE GETS UP TOO QUICKLY AFTER LYING DOWN  . Eczema   . GERD (gastroesophageal reflux disease)   . Goiter   . Headache(784.0)   . Hyperlipidemia   . Hypertension   . Pain    PAIN LOWER BACK AND DOWN RIGHT LEG WITH NUMBNESS, TINGLING RT LEG AND FOOT--STENOSIS  . Partial tear of right rotator cuff    & labral tear  . S/P cardiac cath 2009   Revealing nonobstructive CAD w/ tubular, discrete 40% mid lesion in the circumflex; discrete 30% proximal lesion in the LAD; luminal irregularities, 35% mid lesion in RCA; and generic 40% mid lesion in the RCA. Medical treatment recommended.  . S/P cardiac cath 11/24/2014   Revealing nonobstructive CAD w/ tubular, discrete 40% mid lesion in the circumflex; discrete 30% proximal lesion in the LAD; luminal irregularities, 35% mid lesion in RCA; and generic 40% mid lesion in the RCA. Medical treatment recommended.  . Stroke [facility]) 2004   mild TIA    Past Surgical History:  Procedure Laterality Date  . ABDOMINAL HYSTERECTOMY     oophorectomy L  only  . BREAST CYST ASPIRATION Left   . BREAST CYST EXCISION Left   . BUNIONECTOMY  1990s   LEFT  . CHOLECYSTECTOMY    . HAND SURGERY  1990s   R hand x 2, L hand x 1  . LUMBAR LAMINECTOMY/DECOMPRESSION MICRODISCECTOMY N/A 11/13/2012   Procedure: MICRO LUMBAR DECOMPRESSION L4 - L5 AND L2 - L3 2 LEVELS;  Surgeon: Johnn Hai, MD;  Location: [facility];  Service: Orthopedics;  Laterality: N/A;  . SHOULDER SURGERY  2006   left     Allergies as of 10/27/2019      Reactions   Codeine Other (See Comments)     chest and stomach pain, severe abd cramping   Hydrocodone    Whelts all over/swelling in lips   Sertraline Hcl Other (See Comments)    muscle aches, diarrhea, nausea      Medication List       Accurate as of October 27, 2019 11:59 PM. If you have any questions, ask your nurse or doctor.        ALPRAZolam 0.25 MG tablet Commonly known as: XANAX Take 1-2 tablets (0.25-0.5 mg total) by mouth at bedtime as needed for anxiety or sleep.   amLODipine 10 MG tablet Commonly known as: NORVASC TAKE 1 TABLET(10 MG) BY MOUTH DAILY   aspirin 81 MG tablet Take 81 mg  by mouth every morning.   celecoxib 200 MG capsule Commonly known as: CELEBREX Take 1 capsule (200 mg total) by mouth daily as needed.   citalopram 40 MG tablet Commonly known as: CELEXA Take 0.5 tablets (20 mg total) by mouth daily.   cyclobenzaprine 10 MG tablet Commonly known as: FLEXERIL Take 1 tablet (10 mg total) by mouth 2 (two) times daily as needed for muscle spasms.   famotidine 20 MG tablet Commonly known as: PEPCID Take 1 tablet (20 mg total) by mouth 2 (two) times daily.   pantoprazole 20 MG tablet Commonly known as: Protonix Take 1 tablet (20 mg total) by mouth daily before breakfast.   simvastatin 20 MG tablet Commonly known as: ZOCOR Take 1 tablet (20 mg total) by mouth at bedtime.   traMADol 50 MG tablet Commonly known as: ULTRAM Take 50 mg by mouth 3 (three) times daily as needed for pain.            Objective:   Physical Exam BP (!) 142/61 (BP Location: Left Arm, Patient Position: Sitting, Cuff Size: Normal)   Pulse 83   Temp 98.1 F (36.7 C) (Temporal)   Resp 18   Ht 5\' 1"  (1.549 m)   Wt 183 lb 5 oz (83.2 kg)   SpO2 96%   BMI 34.64 kg/m  General:   Well developed, NAD, BMI noted. HEENT:  Normocephalic . Face symmetric, atraumatic Lungs:  CTA B Normal respiratory effort, no intercostal retractions, no accessory muscle use. Heart: RRR,  no murmur.  Lower extremities: no pretibial edema bilaterally  Skin: Not pale. Not jaundice Neurologic:  alert & oriented X3.  Speech normal, gait appropriate for age and unassisted Psych--  Cognition and judgment appear intact.  Cooperative with normal attention span and concentration.  Behavior appropriate. No anxious or depressed appearing.      Assessment     Assessment  Prediabetes HTN Hyperlipidemia Anxiety depression, insomnia- xanax rx by pcp GERD CAD: catheterization 2009 and 2016: Non-obstructive CAD, 40% blockages, see report. rx medical treatment Goiter: Last ultrasound 12-2014, nodules decrease in size MSK:  --Back - coccyxt pain, lumbar surgery 2014, chronic residual pain --On prn celebrex --on Tramadol (rx by back surgeon)   Vit d def Chronic Dizziness Eczema/psoriasis , sees derm   PLAN: Prediabetes: Recheck A1c, she is unable to exercise much due to back pain, we talk about diet and the need to limit carbohydrates HTN: BP today satisfactory, normal ambulatory BPs, continue Amlodipine, Back pain: Ongoing issue, has seen Ortho few times, plans to see him again, they prescribe local injections but has not gotten those yet.  Currently on Ultram and Celebrex as needed.  Also encouraged to use Flexeril prn. Anxiety, depression, insomnia: Lost her husband last year, fortunately she is emotionally doing well. Preventive care, reluctant to take the Covid vaccination, patient educated pros> cons  RTC CPX  04-2020   This visit occurred during the SARS-CoV-2 public health emergency.  Safety protocols were in place, including screening questions prior to the visit, additional usage of staff PPE, and extensive cleaning of exam room while observing appropriate contact time as indicated for disinfecting solutions.

## 2019-10-27 NOTE — Patient Instructions (Signed)
Continue checking your blood pressure BP GOAL is between 110/65 and  135/85. If it is consistently higher or lower, let me know    GO TO THE LAB : Get the blood work     [place], please reschedule your appointments Come back for a physical exam by October 2021

## 2019-10-28 NOTE — Assessment & Plan Note (Signed)
Prediabetes: Recheck A1c, she is unable to exercise much due to back pain, we talk about diet and the need to limit carbohydrates HTN: BP today satisfactory, normal ambulatory BPs, continue Amlodipine, Back pain: Ongoing issue, has seen Ortho few times, plans to see him again, they prescribe local injections but has not gotten those yet.  Currently on Ultram and Celebrex as needed.  Also encouraged to use Flexeril prn. Anxiety, depression, insomnia: Lost her husband last year, fortunately she is emotionally doing well. Preventive care, reluctant to take the Covid vaccination, patient educated pros> cons  RTC CPX 04-2020

## 2020-01-08 ENCOUNTER — Other Ambulatory Visit: Payer: Self-pay | Admitting: Internal Medicine

## 2020-01-08 ENCOUNTER — Ambulatory Visit
Admission: RE | Admit: 2020-01-08 | Discharge: 2020-01-08 | Disposition: A | Discharge status: Home / Self Care | Payer: Medicare Other | Source: Ambulatory Visit | Attending: Internal Medicine | Admitting: Internal Medicine

## 2020-01-08 ENCOUNTER — Other Ambulatory Visit: Payer: Self-pay

## 2020-01-08 DIAGNOSIS — N6320 Unspecified lump in the left breast, unspecified quadrant: Secondary | ICD-10-CM

## 2020-01-08 DIAGNOSIS — N6321 Unspecified lump in the left breast, upper outer quadrant: Secondary | ICD-10-CM | POA: Diagnosis not present

## 2020-01-11 ENCOUNTER — Other Ambulatory Visit: Payer: Self-pay | Admitting: Internal Medicine

## 2020-01-22 ENCOUNTER — Other Ambulatory Visit: Payer: Self-pay | Admitting: Internal Medicine

## 2020-04-19 ENCOUNTER — Other Ambulatory Visit: Payer: Self-pay | Admitting: Internal Medicine

## 2020-04-27 ENCOUNTER — Encounter: Payer: Self-pay | Admitting: Internal Medicine

## 2020-04-27 ENCOUNTER — Other Ambulatory Visit: Payer: Self-pay

## 2020-04-27 ENCOUNTER — Ambulatory Visit (INDEPENDENT_AMBULATORY_CARE_PROVIDER_SITE_OTHER): Payer: Medicare Other | Admitting: Internal Medicine

## 2020-04-27 VITALS — BP 127/71 | HR 84 | Temp 98.0°F | Resp 16 | Ht 61.0 in | Wt 185.1 lb

## 2020-04-27 DIAGNOSIS — Z23 Encounter for immunization: Secondary | ICD-10-CM

## 2020-04-27 DIAGNOSIS — T452X1A Poisoning by vitamins, accidental (unintentional), initial encounter: Secondary | ICD-10-CM | POA: Diagnosis not present

## 2020-04-27 DIAGNOSIS — R739 Hyperglycemia, unspecified: Secondary | ICD-10-CM | POA: Diagnosis not present

## 2020-04-27 DIAGNOSIS — Z0000 Encounter for general adult medical examination without abnormal findings: Secondary | ICD-10-CM | POA: Diagnosis not present

## 2020-04-27 DIAGNOSIS — E785 Hyperlipidemia, unspecified: Secondary | ICD-10-CM | POA: Diagnosis not present

## 2020-04-27 DIAGNOSIS — I10 Essential (primary) hypertension: Secondary | ICD-10-CM | POA: Diagnosis not present

## 2020-04-27 DIAGNOSIS — E042 Nontoxic multinodular goiter: Secondary | ICD-10-CM

## 2020-04-27 NOTE — Progress Notes (Signed)
Subjective:    Patient ID: [name], female    DOB: Jan 28, 1946, 74 y.o.   MRN: [id number]  DOS:  04/27/2020 Type of visit - description: CPX Since the last office visit she is doing well and has no new symptoms or concerns.  Chronic medical problems seem controlled.   Review of Systems  Other than above, a 14 point review of systems is negative    Past Medical History:  Diagnosis Date   Anxiety and depression    Arthritis    Blood transfusion without reported diagnosis 1989   GB hemorrage    Cataract    Bil/no surgery yet.   Coccyx pain    Secondary to scar tissue from old pressure ulcer after back surgery, Dr. Brantley Stage   Coronary artery disease    Depression 2010   panic attacks and depression   Dizziness    severe: MRI chronic isch. changes and mastoiditis- saw Dr.Mundy(2007)  STATES SHE STILL HAS EPISODES- ESPECIALLY IF SHE GETS UP TOO QUICKLY AFTER LYING DOWN   Eczema    GERD (gastroesophageal reflux disease)    Goiter    Headache(784.0)    Hyperlipidemia    Hypertension    Pain    PAIN LOWER BACK AND DOWN RIGHT LEG WITH NUMBNESS, TINGLING RT LEG AND FOOT--STENOSIS   Partial tear of right rotator cuff    & labral tear   S/P cardiac cath 2009   Revealing nonobstructive CAD w/ tubular, discrete 40% mid lesion in the circumflex; discrete 30% proximal lesion in the LAD; luminal irregularities, 35% mid lesion in RCA; and generic 40% mid lesion in the RCA. Medical treatment recommended.   S/P cardiac cath 11/24/2014   Revealing nonobstructive CAD w/ tubular, discrete 40% mid lesion in the circumflex; discrete 30% proximal lesion in the LAD; luminal irregularities, 35% mid lesion in RCA; and generic 40% mid lesion in the RCA. Medical treatment recommended.   Stroke [facility]) 2004   mild TIA    Past Surgical History:  Procedure Laterality Date   ABDOMINAL HYSTERECTOMY     oophorectomy L only   BREAST CYST ASPIRATION Left    BREAST CYST  EXCISION Left    BUNIONECTOMY  1990s   LEFT   CHOLECYSTECTOMY     HAND SURGERY  1990s   R hand x 2, L hand x 1   LUMBAR LAMINECTOMY/DECOMPRESSION MICRODISCECTOMY N/A 11/13/2012   Procedure: MICRO LUMBAR DECOMPRESSION L4 - L5 AND L2 - L3 2 LEVELS;  Surgeon: Johnn Hai, MD;  Location: [facility];  Service: Orthopedics;  Laterality: N/A;   SHOULDER SURGERY  2006   left     Allergies as of 04/27/2020      Reactions   Codeine Other (See Comments)     chest and stomach pain, severe abd cramping   Hydrocodone    Whelts all over/swelling in lips   Sertraline Hcl Other (See Comments)    muscle aches, diarrhea, nausea      Medication List       Accurate as of April 27, 2020  9:17 PM. If you have any questions, ask your nurse or doctor.        STOP taking these medications   famotidine 20 MG tablet Commonly known as: PEPCID Stopped by: Kathlene November, MD     TAKE these medications   ALPRAZolam 0.25 MG tablet Commonly known as: XANAX Take 1-2 tablets (0.25-0.5 mg total) by mouth at bedtime as needed for anxiety or sleep.  amLODipine 10 MG tablet Commonly known as: NORVASC Take 1 tablet (10 mg total) by mouth daily.   aspirin 81 MG tablet Take 81 mg by mouth every morning.   celecoxib 200 MG capsule Commonly known as: CELEBREX Take 1 capsule (200 mg total) by mouth daily as needed.   citalopram 40 MG tablet Commonly known as: CELEXA Take 0.5 tablets (20 mg total) by mouth daily.   cyclobenzaprine 10 MG tablet Commonly known as: FLEXERIL Take 1 tablet (10 mg total) by mouth 2 (two) times daily as needed for muscle spasms.   pantoprazole 20 MG tablet Commonly known as: Protonix Take 1 tablet (20 mg total) by mouth daily before breakfast.   simvastatin 20 MG tablet Commonly known as: ZOCOR Take 1 tablet (20 mg total) by mouth at bedtime.   traMADol 50 MG tablet Commonly known as: ULTRAM Take 50 mg by mouth 3 (three) times daily as needed for pain.            Objective:   Physical Exam BP 127/71 (BP Location: Left Arm, Patient Position: Sitting, Cuff Size: Normal)    Pulse 84    Temp 98 F (36.7 C) (Oral)    Resp 16    Ht 5\' 1"  (1.549 m)    Wt 185 lb 2 oz (84 kg)    SpO2 95%    BMI 34.98 kg/m  General: Well developed, NAD, BMI noted Neck: No  thyromegaly  HEENT:  Normocephalic . Face symmetric, atraumatic Lungs:  CTA B Normal respiratory effort, no intercostal retractions, no accessory muscle use. Heart: RRR,  no murmur.  Abdomen:  Not distended, soft, non-tender. No rebound or rigidity.   Lower extremities: no pretibial edema bilaterally  Skin: Exposed areas without rash. Not pale. Not jaundice Neurologic:  alert & oriented X3.  Speech normal, gait appropriate for age and unassisted Strength symmetric and appropriate for age.  Psych: Cognition and judgment appear intact.  Cooperative with normal attention span and concentration.  Behavior appropriate. No anxious or depressed appearing.     Assessment     Assessment  Prediabetes HTN Hyperlipidemia Anxiety depression, insomnia- xanax rx by pcp GERD CV: TIA? (2004) CAD: catheterization 2009 and 2016: Non-obstructive CAD, 40% blockages, see report. rx medical treatment Goiter: Last ultrasound 12-2014, nodules decrease in size MSK:  --Back - coccyxt pain, lumbar surgery 2014, chronic residual pain --On prn celebrex --on Tramadol (rx by back surgeon)   Vit d def Chronic Dizziness Eczema/psoriasis , sees derm   PLAN: Here for CPX Prediabetes: Check A1c HTN: BP is very good, continue amlodipine.  Checking labs Hyperlipidemia: On simvastatin, checking labs Anxiety, depression, insomnia: Controlled on citalopram and Xanax nightly. GERD: On PPIs and H2 blockers, asymptomatic, stop H2 blockers, watch for resurface of symptoms. MSK: Symptoms at baseline Vitamin D deficiency: on supplements, check labs  History of goiter: Exam negative today.  Checking a TSH. RTC 6  months   This visit occurred during the SARS-CoV-2 public health emergency.  Safety protocols were in place, including screening questions prior to the visit, additional usage of staff PPE, and extensive cleaning of exam room while observing appropriate contact time as indicated for disinfecting solutions.

## 2020-04-27 NOTE — Assessment & Plan Note (Signed)
-   Td 2012  - PNM 21: 2010;  prevnar--2015 - shingles shot 2013 -Shingrix discussed before - C-19 vaccine: Extensive discussion, pro>>cons, she remains reluctant to proceed - Flu shot: Agreed for a shot today after extensive discussion -female care No recent PAP, was rec by gyn to RTC prn MMG 06/2019 per kpn DEXA 2016 T score -1.1, 06/2019 T score - 1.6  --CCS: Cscope 2004, + polyps adenomatous colonoscopy  07-2009, no polyps.  + cologuard 2019, Cscope 01/2018, 2 polyps, next per GI  -Labs:  CMP, FLP, CBC, TSH, vit d -Diet and exercise discussed.

## 2020-04-27 NOTE — Progress Notes (Signed)
Pre visit review using our clinic review tool, if applicable. No additional management support is needed unless otherwise documented below in the visit note. 

## 2020-04-27 NOTE — Patient Instructions (Addendum)
We will schedule a bone density test  GO TO THE LAB : Get the blood work     [place], [place] back for a checkup in 6 months

## 2020-04-27 NOTE — Assessment & Plan Note (Signed)
Here for CPX Prediabetes: Check A1c HTN: BP is very good, continue amlodipine.  Checking labs Hyperlipidemia: On simvastatin, checking labs Anxiety, depression, insomnia: Controlled on citalopram and Xanax nightly. GERD: On PPIs and H2 blockers, asymptomatic, stop H2 blockers, watch for resurface of symptoms. MSK: Symptoms at baseline Vitamin D deficiency: on supplements, check labs  History of goiter: Exam negative today.  Checking a TSH. RTC 6 months

## 2020-04-28 LAB — CBC WITH DIFFERENTIAL/PLATELET
Absolute Monocytes: 739 cells/uL (ref 200–950)
Basophils Absolute: 26 cells/uL (ref 0–200)
Basophils Relative: 0.3 %
Eosinophils Absolute: 123 cells/uL (ref 15–500)
Eosinophils Relative: 1.4 %
HCT: 46.7 % — ABNORMAL HIGH (ref 35.0–45.0)
Hemoglobin: 15.5 g/dL (ref 11.7–15.5)
Lymphs Abs: 2358 cells/uL (ref 850–3900)
MCH: 31.1 pg (ref 27.0–33.0)
MCHC: 33.2 g/dL (ref 32.0–36.0)
MCV: 93.6 fL (ref 80.0–100.0)
MPV: 10.9 fL (ref 7.5–12.5)
Monocytes Relative: 8.4 %
Neutro Abs: 5553 cells/uL (ref 1500–7800)
Neutrophils Relative %: 63.1 %
Platelets: 181 10*3/uL (ref 140–400)
RBC: 4.99 10*6/uL (ref 3.80–5.10)
RDW: 12.9 % (ref 11.0–15.0)
Total Lymphocyte: 26.8 %
WBC: 8.8 10*3/uL (ref 3.8–10.8)

## 2020-04-28 LAB — TSH: TSH: 4.73 mIU/L — ABNORMAL HIGH (ref 0.40–4.50)

## 2020-04-28 LAB — COMPREHENSIVE METABOLIC PANEL
AG Ratio: 1.5 (calc) (ref 1.0–2.5)
ALT: 20 U/L (ref 6–29)
AST: 26 U/L (ref 10–35)
Albumin: 4.2 g/dL (ref 3.6–5.1)
Alkaline phosphatase (APISO): 88 U/L (ref 37–153)
BUN: 12 mg/dL (ref 7–25)
CO2: 27 mmol/L (ref 20–32)
Calcium: 9.8 mg/dL (ref 8.6–10.4)
Chloride: 102 mmol/L (ref 98–110)
Creat: 0.8 mg/dL (ref 0.60–0.93)
Globulin: 2.8 g/dL (calc) (ref 1.9–3.7)
Glucose, Bld: 110 mg/dL — ABNORMAL HIGH (ref 65–99)
Potassium: 4.2 mmol/L (ref 3.5–5.3)
Sodium: 139 mmol/L (ref 135–146)
Total Bilirubin: 0.6 mg/dL (ref 0.2–1.2)
Total Protein: 7 g/dL (ref 6.1–8.1)

## 2020-04-28 LAB — VITAMIN D 25 HYDROXY (VIT D DEFICIENCY, FRACTURES): Vit D, 25-Hydroxy: 36 ng/mL (ref 30–100)

## 2020-04-28 LAB — LIPID PANEL
Cholesterol: 147 mg/dL (ref <200)
HDL: 54 mg/dL (ref >=50)
LDL Cholesterol (Calc): 74 mg/dL (calc)
Non-HDL Cholesterol (Calc): 93 mg/dL (calc) (ref <130)
Total CHOL/HDL Ratio: 2.7 (calc) (ref <5.0)
Triglycerides: 107 mg/dL (ref <150)

## 2020-06-09 ENCOUNTER — Other Ambulatory Visit: Payer: Self-pay | Admitting: Internal Medicine

## 2020-06-10 ENCOUNTER — Ambulatory Visit (HOSPITAL_BASED_OUTPATIENT_CLINIC_OR_DEPARTMENT_OTHER)
Admission: RE | Admit: 2020-06-10 | Discharge: 2020-06-10 | Disposition: A | Discharge status: Home / Self Care | Payer: Medicare Other | Source: Ambulatory Visit | Attending: Internal Medicine | Admitting: Internal Medicine

## 2020-06-10 ENCOUNTER — Other Ambulatory Visit: Payer: Self-pay

## 2020-06-10 ENCOUNTER — Encounter: Payer: Self-pay | Admitting: Internal Medicine

## 2020-06-10 ENCOUNTER — Ambulatory Visit (INDEPENDENT_AMBULATORY_CARE_PROVIDER_SITE_OTHER): Payer: Medicare Other | Admitting: Internal Medicine

## 2020-06-10 VITALS — BP 125/74 | HR 104 | Temp 98.0°F | Resp 18 | Ht 61.0 in | Wt 184.4 lb

## 2020-06-10 DIAGNOSIS — F32A Depression, unspecified: Secondary | ICD-10-CM | POA: Diagnosis not present

## 2020-06-10 DIAGNOSIS — F419 Anxiety disorder, unspecified: Secondary | ICD-10-CM | POA: Diagnosis not present

## 2020-06-10 DIAGNOSIS — M542 Cervicalgia: Secondary | ICD-10-CM | POA: Diagnosis not present

## 2020-06-10 DIAGNOSIS — M4802 Spinal stenosis, cervical region: Secondary | ICD-10-CM | POA: Diagnosis not present

## 2020-06-10 DIAGNOSIS — M47812 Spondylosis without myelopathy or radiculopathy, cervical region: Secondary | ICD-10-CM | POA: Diagnosis not present

## 2020-06-10 MED ORDER — PREDNISONE 10 MG PO TABS: ORAL_TABLET | ORAL | 0 refills | Status: DC

## 2020-06-10 NOTE — Patient Instructions (Signed)
Please proceed with a  x-ray downstairs  For pain: Take prednisone as prescribed for few days Tylenol 500 mg: 2 tablets every 8 hours as needed Warm compress Call if not gradually better

## 2020-06-10 NOTE — Progress Notes (Signed)
Pre visit review using our clinic review tool, if applicable. No additional management support is needed unless otherwise documented below in the visit note. 

## 2020-06-10 NOTE — Progress Notes (Signed)
Subjective:    Patient ID: [name], female    DOB: 1946-05-16, 74 y.o.   MRN: [id number]  DOS:  06/10/2020 Type of visit - description: acute  Symptoms a started approximately 2 months ago. She has a sharp pain that radiates from behind the right ear up  . Hurts only when she turns her head to the right. Denies any injury. No fever chills or weight loss No history of a new rash in the area (has psoriasis) Denies upper or lower extremity paresthesias. She has some difficulty with her gait, feels he is not very steady but is not a new issue. Denies persistent ear pain.  Review of Systems See above   Past Medical History:  Diagnosis Date  . Anxiety and depression   . Arthritis   . Blood transfusion without reported diagnosis 1989   GB hemorrage   . Cataract    Bil/no surgery yet.  . Coccyx pain    Secondary to scar tissue from old pressure ulcer after back surgery, Dr. Brantley Stage  . Coronary artery disease   . Depression 2010   panic attacks and depression  . Dizziness    severe: MRI chronic isch. changes and mastoiditis- saw Dr.Mundy(2007)  STATES SHE STILL HAS EPISODES- ESPECIALLY IF SHE GETS UP TOO QUICKLY AFTER LYING DOWN  . Eczema   . GERD (gastroesophageal reflux disease)   . Goiter   . Headache(784.0)   . Hyperlipidemia   . Hypertension   . Pain    PAIN LOWER BACK AND DOWN RIGHT LEG WITH NUMBNESS, TINGLING RT LEG AND FOOT--STENOSIS  . Partial tear of right rotator cuff    & labral tear  . S/P cardiac cath 2009   Revealing nonobstructive CAD w/ tubular, discrete 40% mid lesion in the circumflex; discrete 30% proximal lesion in the LAD; luminal irregularities, 35% mid lesion in RCA; and generic 40% mid lesion in the RCA. Medical treatment recommended.  . S/P cardiac cath 11/24/2014   Revealing nonobstructive CAD w/ tubular, discrete 40% mid lesion in the circumflex; discrete 30% proximal lesion in the LAD; luminal irregularities, 35% mid lesion in RCA; and  generic 40% mid lesion in the RCA. Medical treatment recommended.  . Stroke [facility]) 2004   mild TIA    Past Surgical History:  Procedure Laterality Date  . ABDOMINAL HYSTERECTOMY     oophorectomy L only  . BREAST CYST ASPIRATION Left   . BREAST CYST EXCISION Left   . BUNIONECTOMY  1990s   LEFT  . CHOLECYSTECTOMY    . HAND SURGERY  1990s   R hand x 2, L hand x 1  . LUMBAR LAMINECTOMY/DECOMPRESSION MICRODISCECTOMY N/A 11/13/2012   Procedure: MICRO LUMBAR DECOMPRESSION L4 - L5 AND L2 - L3 2 LEVELS;  Surgeon: Johnn Hai, MD;  Location: [facility];  Service: Orthopedics;  Laterality: N/A;  . SHOULDER SURGERY  2006   left     Allergies as of 06/10/2020      Reactions   Codeine Other (See Comments)     chest and stomach pain, severe abd cramping   Hydrocodone    Whelts all over/swelling in lips   Sertraline Hcl Other (See Comments)    muscle aches, diarrhea, nausea      Medication List       Accurate as of June 10, 2020 11:59 PM. If you have any questions, ask your nurse or doctor.        STOP taking these medications   citalopram  40 MG tablet Commonly known as: CELEXA Stopped by: Kathlene November, MD     TAKE these medications   ALPRAZolam 0.25 MG tablet Commonly known as: XANAX Take 1-2 tablets (0.25-0.5 mg total) by mouth at bedtime as needed for anxiety or sleep.   amLODipine 10 MG tablet Commonly known as: NORVASC Take 1 tablet (10 mg total) by mouth daily.   aspirin 81 MG tablet Take 81 mg by mouth every morning.   celecoxib 200 MG capsule Commonly known as: CELEBREX Take 1 capsule (200 mg total) by mouth daily as needed.   cyclobenzaprine 10 MG tablet Commonly known as: FLEXERIL Take 1 tablet (10 mg total) by mouth 2 (two) times daily as needed for muscle spasms.   pantoprazole 20 MG tablet Commonly known as: Protonix Take 1 tablet (20 mg total) by mouth daily before breakfast.   predniSONE 10 MG tablet Commonly known as: DELTASONE 4 tablets x 2 days,  3 tabs x 2 days, 2 tabs x 2 days, 1 tab x 2 days Started by: Kathlene November, MD   simvastatin 20 MG tablet Commonly known as: ZOCOR Take 1 tablet (20 mg total) by mouth at bedtime.   traMADol 50 MG tablet Commonly known as: ULTRAM Take 50 mg by mouth 3 (three) times daily as needed for pain.          Objective:   Physical Exam HENT:     Head:     BP 125/74 (BP Location: Right Arm, Patient Position: Sitting, Cuff Size: Normal)   Pulse (!) 104   Temp 98 F (36.7 C) (Oral)   Resp 18   Ht 5\' 1"  (1.549 m)   Wt 184 lb 6 oz (83.6 kg)   SpO2 95%   BMI 34.84 kg/m  General:   Well developed, NAD, BMI noted. HEENT:  Normocephalic . Face symmetric, atraumatic Neck: Symmetric, no TTP of the cervical spine.  See graphic. Lungs:  CTA B Normal respiratory effort, no intercostal retractions, no accessory muscle use. Heart: RRR,  no murmur.  Lower extremities: no pretibial edema bilaterally  Skin: Not pale. Not jaundice Neurologic:  alert & oriented X3.  Speech normal, gait appropriate for age and unassisted DTRs: Upper extremities symmetric, lower extremities: Absent B knee jerks and R ankle jerk. Psych--  Cognition and judgment appear intact.  Cooperative with normal attention span and concentration.  Behavior appropriate. No anxious or depressed appearing.      Assessment      Assessment  Prediabetes HTN Hyperlipidemia Anxiety depression, insomnia- xanax rx by pcp GERD CV: TIA? (2004) CAD: catheterization 2009 and 2016: Non-obstructive CAD, 40% blockages, see report. rx medical treatment Goiter: Last ultrasound 12-2014, nodules decrease in size MSK:  --Back - coccyxt pain, lumbar surgery 2014, chronic residual pain --On prn celebrex --on Tramadol (rx by back surgeon)   Vit d def Chronic Dizziness Eczema/psoriasis , sees derm   PLAN: Neck pain: New issue. As described above.  Has some absent DTRs, see physical exam, probably related to history of back  surgery. DDx MSK pain vs occipital neuralgia versus C2-3 intervertebral disc issue  Plan: Cervical spine x-ray, round of prednisone, warm compress, if not better will call for referral to neurology.. Anxiety, depression, insomnia: Reports today that she has not taken citalopram in a while,  still takes Xanax as needed.   This visit occurred during the SARS-CoV-2 public health emergency.  Safety protocols were in place, including screening questions prior to the visit, additional usage of staff PPE,  and extensive cleaning of exam room while observing appropriate contact time as indicated for disinfecting solutions.

## 2020-06-12 NOTE — Assessment & Plan Note (Signed)
Neck pain: New issue. As described above.  Has some absent DTRs, see physical exam, probably related to history of back surgery. DDx MSK pain vs occipital neuralgia versus C2-3 intervertebral disc issue  Plan: Cervical spine x-ray, round of prednisone, warm compress, if not better will call for referral to neurology.. Anxiety, depression, insomnia: Reports today that she has not taken citalopram in a while,  still takes Xanax as needed.

## 2020-06-13 ENCOUNTER — Ambulatory Visit (HOSPITAL_BASED_OUTPATIENT_CLINIC_OR_DEPARTMENT_OTHER)
Admission: RE | Admit: 2020-06-13 | Discharge: 2020-06-13 | Disposition: A | Discharge status: Home / Self Care | Payer: Medicare Other | Source: Ambulatory Visit | Attending: Internal Medicine | Admitting: Internal Medicine

## 2020-06-13 ENCOUNTER — Other Ambulatory Visit: Payer: Self-pay

## 2020-06-13 ENCOUNTER — Telehealth: Payer: Self-pay

## 2020-06-13 DIAGNOSIS — R937 Abnormal findings on diagnostic imaging of other parts of musculoskeletal system: Secondary | ICD-10-CM | POA: Insufficient documentation

## 2020-06-13 DIAGNOSIS — J189 Pneumonia, unspecified organism: Secondary | ICD-10-CM

## 2020-06-13 DIAGNOSIS — J90 Pleural effusion, not elsewhere classified: Secondary | ICD-10-CM | POA: Diagnosis not present

## 2020-06-13 DIAGNOSIS — R059 Cough, unspecified: Secondary | ICD-10-CM | POA: Diagnosis not present

## 2020-06-13 MED ORDER — DOXYCYCLINE HYCLATE 100 MG PO TABS: 100.0000 mg | ORAL_TABLET | Freq: Two times a day (BID) | ORAL | 0 refills | Status: DC

## 2020-06-13 NOTE — Telephone Encounter (Signed)
Arrange for chest x-ray, DX abnormal x-ray.

## 2020-06-13 NOTE — Telephone Encounter (Signed)
LMOM informing Pt of results and recommendations. Order placed for chest- xray.

## 2020-06-13 NOTE — Addendum Note (Signed)
Addended byDamita Dunnings D on: 06/13/2020 03:05 PM   Modules accepted: Orders

## 2020-06-13 NOTE — Telephone Encounter (Signed)
Call from [place] at [facility]. Call report--  IMPRESSION: 1. 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 Mild disc space narrowing at C6-7. No other disc space narrowing.  2.  No fracture or spondylolisthesis.  3.  Focus of calcification in the left carotid artery.  4. Question infiltrate right upper lobe, incompletely visualized. Advise dedicated chest radiograph to further evaluate in this regard.  These results will be called to the ordering clinician or representative by the Radiologist Assistant, and communication documented in the PACS or [employer].

## 2020-06-23 ENCOUNTER — Ambulatory Visit: Payer: Self-pay | Admitting: *Deleted

## 2020-07-04 ENCOUNTER — Other Ambulatory Visit: Payer: Self-pay | Admitting: Internal Medicine

## 2020-07-04 ENCOUNTER — Other Ambulatory Visit: Payer: Self-pay

## 2020-07-04 ENCOUNTER — Ambulatory Visit
Admission: RE | Admit: 2020-07-04 | Discharge: 2020-07-04 | Disposition: A | Discharge status: Home / Self Care | Payer: Medicare Other | Source: Ambulatory Visit | Attending: Internal Medicine | Admitting: Internal Medicine

## 2020-07-04 DIAGNOSIS — N6320 Unspecified lump in the left breast, unspecified quadrant: Secondary | ICD-10-CM

## 2020-07-04 DIAGNOSIS — R928 Other abnormal and inconclusive findings on diagnostic imaging of breast: Secondary | ICD-10-CM | POA: Diagnosis not present

## 2020-07-07 ENCOUNTER — Telehealth: Payer: Self-pay | Admitting: Internal Medicine

## 2020-07-07 NOTE — Progress Notes (Signed)
  Chronic Care Management   Outreach Note  07/07/2020 Name: [name] MRN: [id number] DOB: 12-11-1945  Referred by: Colon Branch, MD Reason for referral : No chief complaint on file.   An unsuccessful telephone outreach was attempted today. The patient was referred to the pharmacist for assistance with care management and care coordination.   Follow Up Plan:   Carley Perdue [employer] Scheduler

## 2020-07-11 ENCOUNTER — Telehealth: Payer: Self-pay | Admitting: Internal Medicine

## 2020-07-11 NOTE — Progress Notes (Signed)
  Chronic Care Management   Note  07/11/2020 Name: [name] MRN: [id number] DOB: January 29, 1946  [name] is a 74 y.o. year old female who is a primary care patient of Colon Branch, MD. I reached out to [name] by phone today in response to a referral sent by [name] PCP, Colon Branch, MD.   [name] was given information about Chronic Care Management services today including:  1. CCM service includes personalized support from designated clinical staff supervised by her physician, including individualized plan of care and coordination with other care providers 2. 24/7 contact phone numbers for assistance for urgent and routine care needs. 3. Service will only be billed when office clinical staff spend 20 minutes or more in a month to coordinate care. 4. Only one practitioner may furnish and bill the service in a calendar month. 5. The patient may stop CCM services at any time (effective at the end of the month) by phone call to the office staff.   Patient agreed to services and verbal consent obtained.   Follow up plan:   Carley Perdue [employer] Scheduler

## 2020-07-17 ENCOUNTER — Other Ambulatory Visit: Payer: Self-pay | Admitting: Internal Medicine

## 2020-07-20 NOTE — Progress Notes (Signed)
Subjective:   [name] is a 74 y.o. female who presents for Medicare Annual (Subsequent) preventive examination.  Review of Systems     Cardiac Risk Factors include: advanced age (>53men, >29 women);hypertension;dyslipidemia;obesity (BMI >30kg/m2)     Objective:    Today's Vitals   07/21/20 1327  BP: 122/84  Pulse: (!) 102  Resp: 16  Temp: 98 F (36.7 C)  TempSrc: Oral  SpO2: 94%  Weight: 183 lb 9.6 oz (83.3 kg)  Height: 5\' 1"  (1.549 m)   Body mass index is 34.69 kg/m.  Advanced Directives 07/21/2020 06/23/2019 10/25/2017 12/06/2014 11/14/2012 10/30/2012  Does Patient Have a Medical Advance Directive? No No No No Patient does not have advance directive;Patient would like information Patient does not have advance directive;Patient would like information  Does patient want to make changes to medical advance directive? Yes (MAU/Ambulatory/Procedural Areas - Information given) - - - - -  Would patient like information on creating a medical advance directive? - No - Patient declined Yes (MAU/Ambulatory/Procedural Areas - Information given) - - Advance directive packet given    Current Medications (verified) Outpatient Encounter Medications as of 07/21/2020  Medication Sig  . ALPRAZolam (XANAX) 0.25 MG tablet Take 1-2 tablets (0.25-0.5 mg total) by mouth at bedtime as needed for anxiety or sleep.  Marland Kitchen amLODipine (NORVASC) 10 MG tablet Take 1 tablet (10 mg total) by mouth daily.  Marland Kitchen aspirin 81 MG tablet Take 81 mg by mouth every morning.  . celecoxib (CELEBREX) 200 MG capsule Take 1 capsule (200 mg total) by mouth daily as needed.  . cyclobenzaprine (FLEXERIL) 10 MG tablet Take 1 tablet (10 mg total) by mouth 2 (two) times daily as needed for muscle spasms.  Marland Kitchen doxycycline (VIBRA-TABS) 100 MG tablet Take 1 tablet (100 mg total) by mouth 2 (two) times daily.  . pantoprazole (PROTONIX) 20 MG tablet Take 1 tablet (20 mg total) by mouth daily before breakfast.  . predniSONE  (DELTASONE) 10 MG tablet 4 tablets x 2 days, 3 tabs x 2 days, 2 tabs x 2 days, 1 tab x 2 days  . simvastatin (ZOCOR) 20 MG tablet Take 1 tablet (20 mg total) by mouth at bedtime.  . traMADol (ULTRAM) 50 MG tablet Take 50 mg by mouth 3 (three) times daily as needed for pain.   No facility-administered encounter medications on file as of 07/21/2020.    Allergies (verified) Codeine, Hydrocodone, and Sertraline hcl   History: Past Medical History:  Diagnosis Date  . Anxiety and depression   . Arthritis   . Blood transfusion without reported diagnosis 1989   GB hemorrage   . Cataract    Bil/no surgery yet.  . Coccyx pain    Secondary to scar tissue from old pressure ulcer after back surgery, Dr. Brantley Stage  . Coronary artery disease   . Depression 2010   panic attacks and depression  . Dizziness    severe: MRI chronic isch. changes and mastoiditis- saw Dr.Mundy(2007)  STATES SHE STILL HAS EPISODES- ESPECIALLY IF SHE GETS UP TOO QUICKLY AFTER LYING DOWN  . Eczema   . GERD (gastroesophageal reflux disease)   . Goiter   . Headache(784.0)   . Hyperlipidemia   . Hypertension   . Pain    PAIN LOWER BACK AND DOWN RIGHT LEG WITH NUMBNESS, TINGLING RT LEG AND FOOT--STENOSIS  . Partial tear of right rotator cuff    & labral tear  . S/P cardiac cath 2009   Revealing nonobstructive CAD w/ tubular,  discrete 40% mid lesion in the circumflex; discrete 30% proximal lesion in the LAD; luminal irregularities, 35% mid lesion in RCA; and generic 40% mid lesion in the RCA. Medical treatment recommended.  . S/P cardiac cath 11/24/2014   Revealing nonobstructive CAD w/ tubular, discrete 40% mid lesion in the circumflex; discrete 30% proximal lesion in the LAD; luminal irregularities, 35% mid lesion in RCA; and generic 40% mid lesion in the RCA. Medical treatment recommended.  . Stroke [facility]) 2004   mild TIA   Past Surgical History:  Procedure Laterality Date  . ABDOMINAL HYSTERECTOMY     oophorectomy L  only  . BREAST CYST ASPIRATION Left   . BREAST CYST EXCISION Left   . BUNIONECTOMY  1990s   LEFT  . CHOLECYSTECTOMY    . HAND SURGERY  1990s   R hand x 2, L hand x 1  . LUMBAR LAMINECTOMY/DECOMPRESSION MICRODISCECTOMY N/A 11/13/2012   Procedure: MICRO LUMBAR DECOMPRESSION L4 - L5 AND L2 - L3 2 LEVELS;  Surgeon: Johnn Hai, MD;  Location: [facility];  Service: Orthopedics;  Laterality: N/A;  . SHOULDER SURGERY  2006   left    Family History  Problem Relation Age of Onset  . Breast cancer Other        2 cousins  . Throat cancer Other        cousin  . Heart attack Brother        2 brother s, CABG  . Heart disease Brother   . Lung cancer Mother        cousin  . Lung cancer Brother   . Throat cancer Brother   . Cataracts Father   . Heart disease Brother   . Colon cancer Neg Hx   . Diabetes Neg Hx    Social History   Socioeconomic History  . Marital status: Widowed    Spouse name: Not on file  . Number of children: 4  . Years of education: Not on file  . Highest education level: Not on file  Occupational History  . Occupation: retired     Fish farm manager: RETIRED  Tobacco Use  . Smoking status: Former Smoker    Quit date: 2000    Years since quitting: 22.0  . Smokeless tobacco: Never Used  . Tobacco comment: quit aprox 2000 after 30 years, 1 to 1.5 ppd  Substance and Sexual Activity  . Alcohol use: No  . Drug use: No  . Sexual activity: Not Currently  Other Topics Concern  . Not on file  Social History Narrative   Lost husband [name]) 12/2018; 2 daughters. 2 sons    youngest son lives w/her    Social Determinants of Health   Financial Resource Strain: Low Risk   . Difficulty of Paying Living Expenses: Not hard at all  Food Insecurity: No Food Insecurity  . Worried About Charity fundraiser in the Last Year: Never true  . Ran Out of Food in the Last Year: Never true  Transportation Needs: No Transportation Needs  . Lack of Transportation (Medical): No  . Lack of  Transportation (Non-Medical): No  Physical Activity: Inactive  . Days of Exercise per Week: 0 days  . Minutes of Exercise per Session: 0 min  Stress: Stress Concern Present  . Feeling of Stress : To some extent  Social Connections: Socially Isolated  . Frequency of Communication with Friends and Family: More than three times a week  . Frequency of Social Gatherings with Friends and Family:  More than three times a week  . Attends Religious Services: Never  . Active Member of Clubs or Organizations: No  . Attends Archivist Meetings: Never  . Marital Status: Widowed    Tobacco Counseling Counseling given: Not Answered Comment: quit aprox 2000 after 30 years, 1 to 1.5 ppd   Clinical Intake:  Pre-visit preparation completed: Yes  Pain : No/denies pain     Nutritional Status: BMI > 30  Obese Nutritional Risks: None Diabetes: No  How often do you need to have someone help you when you read instructions, pamphlets, or other written materials from your doctor or pharmacy?: 1 - Never What is the last grade level you completed in school?: 10th grade  Diabetic?No  Interpreter Needed?: No  Information entered by :: Caroleen Hamman LPN   Activities of Daily Living In your present state of health, do you have any difficulty performing the following activities: 07/21/2020  Hearing? N  Vision? N  Difficulty concentrating or making decisions? N  Walking or climbing stairs? N  Dressing or bathing? N  Doing errands, shopping? N  Preparing Food and eating ? N  Using the Toilet? N  In the past six months, have you accidently leaked urine? N  Do you have problems with loss of bowel control? N  Managing your Medications? N  Managing your Finances? N  Housekeeping or managing your Housekeeping? N  Some recent data might be hidden    Patient Care Team: Colon Branch, MD as PCP - General [name], Nadara Mustard, MD as Consulting Physician (Gynecology) Ladene Artist, MD as  Consulting Physician (Gastroenterology) Erroll Luna, MD as Consulting Physician (General Surgery) Pedro Earls, MD as Attending Physician (Orthopedic Surgery) Netta Cedars, MD as Consulting Physician (Orthopedic Surgery) Linda Hedges, DO as Consulting Physician (Obstetrics and Gynecology) Day, Melvenia Beam, [facility] (Pharmacist) Day, Melvenia Beam, [facility] as Pharmacist (Pharmacist)  Indicate any recent Medical Services you may have received from other than [facility] providers in the past year (date may be approximate).     Assessment:   This is a routine wellness examination for Penni.  Hearing/Vision screen  Hearing Screening   125Hz  250Hz  500Hz  1000Hz  2000Hz  3000Hz  4000Hz  6000Hz  8000Hz   Right ear:           Left ear:           Comments: No issues  Vision Screening Comments: Last eye exam-2021-[facility]  Dietary issues and exercise activities discussed: Current Exercise Habits: The patient does not participate in regular exercise at present, Exercise limited by: None identified  Goals    . DIET - INCREASE WATER INTAKE    . Increase physical activity      Depression Screen PHQ 2/9 Scores 07/21/2020 04/27/2020 10/27/2019 06/23/2019 04/28/2019 10/25/2017 03/13/2017  PHQ - 2 Score 0 2 2 1 4  0 2  PHQ- 9 Score - 5 5 1 8  - 10    Fall Risk Fall Risk  07/21/2020 06/23/2019 04/28/2019 10/25/2017 03/13/2017  Falls in the past year? 0 0 0 No No  Number falls in past yr: 0 - - - -  Injury with Fall? 0 - - - -  Follow up Falls prevention discussed Education provided;Falls prevention discussed Falls evaluation completed - -    FALL RISK PREVENTION PERTAINING TO THE HOME:  Any stairs in or around the home? Yes  If so, are there any without handrails? No  Home free of loose throw rugs in walkways, pet beds, electrical cords, etc? Yes  Adequate lighting in your home to reduce risk of falls? Yes   ASSISTIVE DEVICES UTILIZED TO PREVENT FALLS:  Life alert? No  Use of a cane, walker or w/c? No  Grab bars in  the bathroom? Yes  Shower chair or bench in shower? No  Elevated toilet seat or a handicapped toilet? No   TIMED UP AND GO:  Was the test performed? Yes .  Length of time to ambulate 10 feet: 10 sec.   Gait steady and fast without use of assistive device  Cognitive Function:Normal cognitive status assessed by direct observation by this Nurse Health Advisor. No abnormalities found.   MMSE - Mini Mental State Exam 10/25/2017  Orientation to time 5  Orientation to Place 5  Registration 3  Attention/ Calculation 5  Recall 3  Language- name 2 objects 2  Language- repeat 1  Language- follow 3 step command 3  Language- read & follow direction 1  Write a sentence 1  Copy design 1  Total score 30        Immunizations Immunization History  Administered Date(s) Administered  . Fluad Quad(high Dose 65+) 04/28/2019, 04/27/2020  . Influenza Whole 05/09/2007, 07/18/2009, 06/12/2010  . Influenza, High Dose Seasonal PF 06/01/2015, 05/23/2017, 05/08/2018  . Influenza, Seasonal, Injecte, Preservative Fre 08/01/2012  . Influenza,inj,Quad PF,6+ Mos 04/07/2014  . Pneumococcal Conjugate-13 12/03/2013  . Pneumococcal Polysaccharide-23 07/18/2009  . Tdap 12/20/2010  . Zoster 12/26/2011    TDAP status: Up to date  Flu Vaccine status: Up to date  Pneumococcal vaccine status: Up to date  Covid-19 vaccine status: Declined, Education has been provided regarding the importance of this vaccine but patient still declined. Advised may receive this vaccine at local pharmacy or Health Dept.or vaccine clinic. Aware to provide a copy of the vaccination record if obtained from local pharmacy or Health Dept. Verbalized acceptance and understanding.  Qualifies for Shingles Vaccine? Yes   Zostavax completed Yes   Shingrix Completed?: No.    Education has been provided regarding the importance of this vaccine. Patient has been advised to call insurance company to determine out of pocket expense if they have  not yet received this vaccine. Advised may also receive vaccine at local pharmacy or Health Dept. Verbalized acceptance and understanding.  Screening Tests Health Maintenance  Topic Date Due  . COVID-19 Vaccine (1) 06/10/2021 (Originally 01/11/1958)  . TETANUS/TDAP  12/19/2020  . MAMMOGRAM  07/04/2021  . DEXA SCAN  07/01/2024  . INFLUENZA VACCINE  Completed  . Hepatitis C Screening  Completed  . PNA vac Low Risk Adult  Completed    Health Maintenance  There are no preventive care reminders to display for this patient.  Colorectal cancer screening: No longer required.   Mammogram status: Completed Bilateral 07/04/2020. Repeat every year Abnormal-Biopsy recommended  Bone Density status: Completed 07/02/2019. Results reflect: Bone density results: OSTEOPENIA. Repeat every 2 years.  Lung Cancer Screening: (Low Dose CT Chest recommended if Age 11-80 years, 30 pack-year currently smoking OR have quit w/in 15years.) does not qualify.     Additional Screening:  Hepatitis C Screening:  Completed 09/10/2016  Vision Screening: Recommended annual ophthalmology exams for early detection of glaucoma and other disorders of the eye. Is the patient up to date with their annual eye exam?  Yes  Who is the provider or what is the name of the office in which the patient attends annual eye exams? [facility]   Dental Screening: Recommended annual dental exams for proper oral hygiene  Community Resource Referral / Chronic Care Management: CRR required this visit?  No   CCM required this visit?  No      Plan:     I have personally reviewed and noted the following in the patient's chart:   . Medical and social history . Use of alcohol, tobacco or illicit drugs  . Current medications and supplements . Functional ability and status . Nutritional status . Physical activity . Advanced directives . List of other physicians . Hospitalizations, surgeries, and ER visits in previous 12  months . Vitals . Screenings to include cognitive, depression, and falls . Referrals and appointments  In addition, I have reviewed and discussed with patient certain preventive protocols, quality metrics, and best practice recommendations. A written personalized care plan for preventive services as well as general preventive health recommendations were provided to patient.     Marta Antu, LPN   [id number]  Nurse Health Advisor  Nurse Notes: None

## 2020-07-21 ENCOUNTER — Ambulatory Visit (INDEPENDENT_AMBULATORY_CARE_PROVIDER_SITE_OTHER): Payer: Medicare Other

## 2020-07-21 ENCOUNTER — Other Ambulatory Visit: Payer: Self-pay

## 2020-07-21 ENCOUNTER — Ambulatory Visit (HOSPITAL_BASED_OUTPATIENT_CLINIC_OR_DEPARTMENT_OTHER)
Admission: RE | Admit: 2020-07-21 | Discharge: 2020-07-21 | Disposition: A | Discharge status: Home / Self Care | Payer: Medicare Other | Source: Ambulatory Visit | Attending: Internal Medicine | Admitting: Internal Medicine

## 2020-07-21 VITALS — BP 122/84 | HR 102 | Temp 98.0°F | Resp 16 | Ht 61.0 in | Wt 183.6 lb

## 2020-07-21 DIAGNOSIS — J189 Pneumonia, unspecified organism: Secondary | ICD-10-CM | POA: Insufficient documentation

## 2020-07-21 DIAGNOSIS — Z0000 Encounter for general adult medical examination without abnormal findings: Secondary | ICD-10-CM | POA: Diagnosis not present

## 2020-07-21 NOTE — Patient Instructions (Signed)
[name] , Thank you for taking time to come for your Medicare Wellness Visit. I appreciate your ongoing commitment to your health goals. Please review the following plan we discussed and let me know if I can assist you in the future.   Screening recommendations/referrals: Colonoscopy: No longer required Mammogram: Completed 07/04/2020-Due 07/04/2021 (folow recommendations given after upcoming biopsy) Bone Density: Completed 07/02/19-Due-07/01/2021. Recommended yearly ophthalmology/optometry visit for glaucoma screening and checkup Recommended yearly dental visit for hygiene and checkup  Vaccinations: Influenza vaccine: Up to date Pneumococcal vaccine: Completed vaccines Tdap vaccine: Up to date-Due 12/19/2020 Shingles vaccine: Discuss with pharmcay  Covid-19:Declined  Advanced directives: Information given today.  Conditions/risks identified: See problem list  Next appointment: Follow up in one year for your annual wellness visit    Preventive Care 65 Years and Older, Female Preventive care refers to lifestyle choices and visits with your health care provider that can promote health and wellness. What does preventive care include?  A yearly physical exam. This is also called an annual well check.  Dental exams once or twice a year.  Routine eye exams. Ask your health care provider how often you should have your eyes checked.  Personal lifestyle choices, including:  Daily care of your teeth and gums.  Regular physical activity.  Eating a healthy diet.  Avoiding tobacco and drug use.  Limiting alcohol use.  Practicing safe sex.  Taking low-dose aspirin every day.  Taking vitamin and mineral supplements as recommended by your health care provider. What happens during an annual well check? The services and screenings done by your health care provider during your annual well check will depend on your age, overall health, lifestyle risk factors, and family history  of disease. Counseling  Your health care provider may ask you questions about your:  Alcohol use.  Tobacco use.  Drug use.  Emotional well-being.  Home and relationship well-being.  Sexual activity.  Eating habits.  History of falls.  Memory and ability to understand (cognition).  Work and work Astronomer.  Reproductive health. Screening  You may have the following tests or measurements:  Height, weight, and BMI.  Blood pressure.  Lipid and cholesterol levels. These may be checked every 5 years, or more frequently if you are over 78 years old.  Skin check.  Lung cancer screening. You may have this screening every year starting at age 24 if you have a 30-pack-year history of smoking and currently smoke or have quit within the past 15 years.  Fecal occult blood test (FOBT) of the stool. You may have this test every year starting at age 72.  Flexible sigmoidoscopy or colonoscopy. You may have a sigmoidoscopy every 5 years or a colonoscopy every 10 years starting at age 6.  Hepatitis C blood test.  Hepatitis B blood test.  Sexually transmitted disease (STD) testing.  Diabetes screening. This is done by checking your blood sugar (glucose) after you have not eaten for a while (fasting). You may have this done every 1-3 years.  Bone density scan. This is done to screen for osteoporosis. You may have this done starting at age 4.  Mammogram. This may be done every 1-2 years. Talk to your health care provider about how often you should have regular mammograms. Talk with your health care provider about your test results, treatment options, and if necessary, the need for more tests. Vaccines  Your health care provider may recommend certain vaccines, such as:  Influenza vaccine. This is recommended every year.  Tetanus,  diphtheria, and acellular pertussis (Tdap, Td) vaccine. You may need a Td booster every 10 years.  Zoster vaccine. You may need this after age  79.  Pneumococcal 13-valent conjugate (PCV13) vaccine. One dose is recommended after age 73.  Pneumococcal polysaccharide (PPSV23) vaccine. One dose is recommended after age 10. Talk to your health care provider about which screenings and vaccines you need and how often you need them. This information is not intended to replace advice given to you by your health care provider. Make sure you discuss any questions you have with your health care provider. Document Released: 08/05/2015 Document Revised: 03/28/2016 Document Reviewed: 05/10/2015 Elsevier Interactive Patient Education  2017 [place] Prevention in the Home Falls can cause injuries. They can happen to people of all ages. There are many things you can do to make your home safe and to help prevent falls. What can I do on the outside of my home?  Regularly fix the edges of walkways and driveways and fix any cracks.  Remove anything that might make you trip as you walk through a door, such as a raised step or threshold.  Trim any bushes or trees on the path to your home.  Use bright outdoor lighting.  Clear any walking paths of anything that might make someone trip, such as rocks or tools.  Regularly check to see if handrails are loose or broken. Make sure that both sides of any steps have handrails.  Any raised decks and porches should have guardrails on the edges.  Have any leaves, snow, or ice cleared regularly.  Use sand or salt on walking paths during winter.  Clean up any spills in your garage right away. This includes oil or grease spills. What can I do in the bathroom?  Use night lights.  Install grab bars by the toilet and in the tub and shower. Do not use towel bars as grab bars.  Use non-skid mats or decals in the tub or shower.  If you need to sit down in the shower, use a plastic, non-slip stool.  Keep the floor dry. Clean up any water that spills on the floor as soon as it happens.  Remove  soap buildup in the tub or shower regularly.  Attach bath mats securely with double-sided non-slip rug tape.  Do not have throw rugs and other things on the floor that can make you trip. What can I do in the bedroom?  Use night lights.  Make sure that you have a light by your bed that is easy to reach.  Do not use any sheets or blankets that are too big for your bed. They should not hang down onto the floor.  Have a firm chair that has side arms. You can use this for support while you get dressed.  Do not have throw rugs and other things on the floor that can make you trip. What can I do in the kitchen?  Clean up any spills right away.  Avoid walking on wet floors.  Keep items that you use a lot in easy-to-reach places.  If you need to reach something above you, use a strong step stool that has a grab bar.  Keep electrical cords out of the way.  Do not use floor polish or wax that makes floors slippery. If you must use wax, use non-skid floor wax.  Do not have throw rugs and other things on the floor that can make you trip. What can I do with  my stairs?  Do not leave any items on the stairs.  Make sure that there are handrails on both sides of the stairs and use them. Fix handrails that are broken or loose. Make sure that handrails are as long as the stairways.  Check any carpeting to make sure that it is firmly attached to the stairs. Fix any carpet that is loose or worn.  Avoid having throw rugs at the top or bottom of the stairs. If you do have throw rugs, attach them to the floor with carpet tape.  Make sure that you have a light switch at the top of the stairs and the bottom of the stairs. If you do not have them, ask someone to add them for you. What else can I do to help prevent falls?  Wear shoes that:  Do not have high heels.  Have rubber bottoms.  Are comfortable and fit you well.  Are closed at the toe. Do not wear sandals.  If you use a  stepladder:  Make sure that it is fully opened. Do not climb a closed stepladder.  Make sure that both sides of the stepladder are locked into place.  Ask someone to hold it for you, if possible.  Clearly mark and make sure that you can see:  Any grab bars or handrails.  First and last steps.  Where the edge of each step is.  Use tools that help you move around (mobility aids) if they are needed. These include:  Canes.  Walkers.  Scooters.  Crutches.  Turn on the lights when you go into a dark area. Replace any light bulbs as soon as they burn out.  Set up your furniture so you have a clear path. Avoid moving your furniture around.  If any of your floors are uneven, fix them.  If there are any pets around you, be aware of where they are.  Review your medicines with your doctor. Some medicines can make you feel dizzy. This can increase your chance of falling. Ask your doctor what other things that you can do to help prevent falls. This information is not intended to replace advice given to you by your health care provider. Make sure you discuss any questions you have with your health care provider. Document Released: 05/05/2009 Document Revised: 12/15/2015 Document Reviewed: 08/13/2014 Elsevier Interactive Patient Education  2017 [employer].

## 2020-07-25 ENCOUNTER — Telehealth: Payer: Self-pay | Admitting: Internal Medicine

## 2020-07-25 DIAGNOSIS — R9389 Abnormal findings on diagnostic imaging of other specified body structures: Secondary | ICD-10-CM

## 2020-07-25 DIAGNOSIS — R918 Other nonspecific abnormal finding of lung field: Secondary | ICD-10-CM

## 2020-07-25 MED ORDER — ALPRAZOLAM 0.25 MG PO TABS: 0.2500 mg | ORAL_TABLET | Freq: Every evening | ORAL | 0 refills | Status: DC | PRN

## 2020-07-25 NOTE — Telephone Encounter (Signed)
Spoke w/ Pt- informed Rx sent and recommendations.

## 2020-07-25 NOTE — Telephone Encounter (Signed)
Chest x-ray done 05-2020 due to incidental finding of a infiltrate on a neck x-ray. At the time CXR show airspace opacity, possibly pneumonia.  Was treated with doxycycline.  She was basically asymptomatic. Follow-up chest x-ray 07/21/2020: Reticular opacity persists, unchange from November. Plan: Advise patient:  Due to persistent RUL opacity (likely fibrosis, doubt infection or malignancy), I rec the following: -High-resolution chest CT: DX persistent RUL opacity. -Pulmonary referral Please arrange

## 2020-07-25 NOTE — Telephone Encounter (Signed)
Spoke w/ Pt- informed of results and recommendations. Pt verbalized understanding. Number for pulm office given for her to call and schedule visit. Also, she wanted to know if PCP would send her in a "nerve medicine"- she has a biopsy scheduled for tomorrow on her breast. Alprazolam hasn't been refilled since 09/2018.

## 2020-07-25 NOTE — Telephone Encounter (Signed)
Advise patient, I sent a refill for Xanax, okay to take it before the breast biopsy but I do recommend her to have a driver to get back home.

## 2020-07-26 ENCOUNTER — Ambulatory Visit
Admission: RE | Admit: 2020-07-26 | Discharge: 2020-07-26 | Disposition: A | Discharge status: Home / Self Care | Payer: Medicare Other | Source: Ambulatory Visit | Attending: Internal Medicine | Admitting: Internal Medicine

## 2020-07-26 ENCOUNTER — Other Ambulatory Visit: Payer: Self-pay

## 2020-07-26 DIAGNOSIS — N6320 Unspecified lump in the left breast, unspecified quadrant: Secondary | ICD-10-CM

## 2020-07-26 DIAGNOSIS — C50412 Malignant neoplasm of upper-outer quadrant of left female breast: Secondary | ICD-10-CM | POA: Diagnosis not present

## 2020-07-26 DIAGNOSIS — Z170 Estrogen receptor positive status [ER+]: Secondary | ICD-10-CM | POA: Diagnosis not present

## 2020-07-26 DIAGNOSIS — R928 Other abnormal and inconclusive findings on diagnostic imaging of breast: Secondary | ICD-10-CM | POA: Diagnosis not present

## 2020-07-26 DIAGNOSIS — N6321 Unspecified lump in the left breast, upper outer quadrant: Secondary | ICD-10-CM | POA: Diagnosis not present

## 2020-07-27 ENCOUNTER — Telehealth: Payer: Self-pay | Admitting: Internal Medicine

## 2020-07-27 NOTE — Telephone Encounter (Signed)
I just learned that the patient was diagnosed with breast cancer. She has an abnormal chest x-ray and I schedule a CT chest high-resolution for tomorrow. Plan: Advise patient recommend to cancel high-resolution CT chest, most likely she will need a CT with contrast and/or PET scan.  I will let oncology decide

## 2020-07-27 NOTE — Telephone Encounter (Signed)
Spoke w/ Pt- cancelled CT.

## 2020-07-28 ENCOUNTER — Inpatient Hospital Stay: Admission: RE | Admit: 2020-07-28 | Payer: Medicare Other | Source: Ambulatory Visit

## 2020-08-03 ENCOUNTER — Encounter: Payer: Self-pay | Admitting: *Deleted

## 2020-08-03 DIAGNOSIS — C50412 Malignant neoplasm of upper-outer quadrant of left female breast: Secondary | ICD-10-CM

## 2020-08-03 DIAGNOSIS — Z170 Estrogen receptor positive status [ER+]: Secondary | ICD-10-CM | POA: Insufficient documentation

## 2020-08-04 ENCOUNTER — Telehealth: Payer: Self-pay | Admitting: Internal Medicine

## 2020-08-04 NOTE — Progress Notes (Signed)
  Chronic Care Management   Note  08/04/2020 Name: ROSANNE WOHLFARTH MRN: [id number] DOB: 06-21-1946  Tenna Child Oscarson is a 75 y.o. year old female who is a primary care patient of Colon Branch, MD. I reached out to [name] by phone today in response to a referral sent by Ms. [place] PCP, Colon Branch, MD.   Ms. Gressman was given information about Chronic Care Management services today including:  1. CCM service includes personalized support from designated clinical staff supervised by her physician, including individualized plan of care and coordination with other care providers 2. 24/7 contact phone numbers for assistance for urgent and routine care needs. 3. Service will only be billed when office clinical staff spend 20 minutes or more in a month to coordinate care. 4. Only one practitioner may furnish and bill the service in a calendar month. 5. The patient may stop CCM services at any time (effective at the end of the month) by phone call to the office staff.   Patient agreed to services and verbal consent obtained.   Follow up plan:   Carley Perdue [employer] Scheduler

## 2020-08-10 ENCOUNTER — Encounter: Payer: Self-pay | Admitting: *Deleted

## 2020-08-10 ENCOUNTER — Other Ambulatory Visit: Payer: Self-pay

## 2020-08-10 ENCOUNTER — Encounter: Payer: Self-pay | Admitting: Hematology

## 2020-08-10 ENCOUNTER — Ambulatory Visit
Admission: RE | Admit: 2020-08-10 | Discharge: 2020-08-10 | Disposition: A | Discharge status: Home / Self Care | Payer: Medicare Other | Source: Ambulatory Visit | Attending: Radiation Oncology | Admitting: Radiation Oncology

## 2020-08-10 ENCOUNTER — Ambulatory Visit: Payer: Medicare Other | Admitting: Physical Therapy

## 2020-08-10 ENCOUNTER — Inpatient Hospital Stay: Payer: Medicare Other

## 2020-08-10 ENCOUNTER — Inpatient Hospital Stay (HOSPITAL_BASED_OUTPATIENT_CLINIC_OR_DEPARTMENT_OTHER): Payer: Medicare Other | Admitting: Genetic Counselor

## 2020-08-10 ENCOUNTER — Encounter: Payer: Self-pay | Admitting: General Practice

## 2020-08-10 ENCOUNTER — Encounter: Payer: Self-pay | Admitting: Radiation Oncology

## 2020-08-10 ENCOUNTER — Other Ambulatory Visit: Payer: Self-pay | Admitting: General Surgery

## 2020-08-10 ENCOUNTER — Inpatient Hospital Stay: Payer: Medicare Other | Attending: Hematology | Admitting: Hematology

## 2020-08-10 ENCOUNTER — Encounter: Payer: Self-pay | Admitting: Genetic Counselor

## 2020-08-10 VITALS — BP 119/96 | HR 91 | Temp 97.3°F | Resp 19 | Ht 61.0 in | Wt 176.6 lb

## 2020-08-10 DIAGNOSIS — K219 Gastro-esophageal reflux disease without esophagitis: Secondary | ICD-10-CM

## 2020-08-10 DIAGNOSIS — Z8673 Personal history of transient ischemic attack (TIA), and cerebral infarction without residual deficits: Secondary | ICD-10-CM

## 2020-08-10 DIAGNOSIS — Z808 Family history of malignant neoplasm of other organs or systems: Secondary | ICD-10-CM

## 2020-08-10 DIAGNOSIS — Z90710 Acquired absence of both cervix and uterus: Secondary | ICD-10-CM | POA: Insufficient documentation

## 2020-08-10 DIAGNOSIS — Z800 Family history of malignant neoplasm of digestive organs: Secondary | ICD-10-CM

## 2020-08-10 DIAGNOSIS — Z801 Family history of malignant neoplasm of trachea, bronchus and lung: Secondary | ICD-10-CM | POA: Insufficient documentation

## 2020-08-10 DIAGNOSIS — Z79899 Other long term (current) drug therapy: Secondary | ICD-10-CM | POA: Diagnosis not present

## 2020-08-10 DIAGNOSIS — Z87891 Personal history of nicotine dependence: Secondary | ICD-10-CM | POA: Diagnosis not present

## 2020-08-10 DIAGNOSIS — C50412 Malignant neoplasm of upper-outer quadrant of left female breast: Secondary | ICD-10-CM | POA: Diagnosis not present

## 2020-08-10 DIAGNOSIS — F419 Anxiety disorder, unspecified: Secondary | ICD-10-CM | POA: Insufficient documentation

## 2020-08-10 DIAGNOSIS — F32A Depression, unspecified: Secondary | ICD-10-CM | POA: Diagnosis not present

## 2020-08-10 DIAGNOSIS — J189 Pneumonia, unspecified organism: Secondary | ICD-10-CM | POA: Diagnosis not present

## 2020-08-10 DIAGNOSIS — R110 Nausea: Secondary | ICD-10-CM | POA: Insufficient documentation

## 2020-08-10 DIAGNOSIS — M549 Dorsalgia, unspecified: Secondary | ICD-10-CM | POA: Insufficient documentation

## 2020-08-10 DIAGNOSIS — R197 Diarrhea, unspecified: Secondary | ICD-10-CM | POA: Diagnosis not present

## 2020-08-10 DIAGNOSIS — Z170 Estrogen receptor positive status [ER+]: Secondary | ICD-10-CM

## 2020-08-10 DIAGNOSIS — Z803 Family history of malignant neoplasm of breast: Secondary | ICD-10-CM

## 2020-08-10 DIAGNOSIS — E785 Hyperlipidemia, unspecified: Secondary | ICD-10-CM | POA: Insufficient documentation

## 2020-08-10 DIAGNOSIS — Z8051 Family history of malignant neoplasm of kidney: Secondary | ICD-10-CM

## 2020-08-10 DIAGNOSIS — I10 Essential (primary) hypertension: Secondary | ICD-10-CM | POA: Diagnosis not present

## 2020-08-10 DIAGNOSIS — Z8249 Family history of ischemic heart disease and other diseases of the circulatory system: Secondary | ICD-10-CM | POA: Insufficient documentation

## 2020-08-10 DIAGNOSIS — R519 Headache, unspecified: Secondary | ICD-10-CM | POA: Insufficient documentation

## 2020-08-10 DIAGNOSIS — M8580 Other specified disorders of bone density and structure, unspecified site: Secondary | ICD-10-CM | POA: Insufficient documentation

## 2020-08-10 DIAGNOSIS — G8929 Other chronic pain: Secondary | ICD-10-CM | POA: Insufficient documentation

## 2020-08-10 DIAGNOSIS — Z83518 Family history of other specified eye disorder: Secondary | ICD-10-CM | POA: Insufficient documentation

## 2020-08-10 DIAGNOSIS — M1990 Unspecified osteoarthritis, unspecified site: Secondary | ICD-10-CM | POA: Insufficient documentation

## 2020-08-10 DIAGNOSIS — Z9049 Acquired absence of other specified parts of digestive tract: Secondary | ICD-10-CM | POA: Insufficient documentation

## 2020-08-10 DIAGNOSIS — Z885 Allergy status to narcotic agent status: Secondary | ICD-10-CM | POA: Insufficient documentation

## 2020-08-10 LAB — CBC WITH DIFFERENTIAL (CANCER CENTER ONLY)
Abs Immature Granulocytes: 0.02 10*3/uL (ref 0.00–0.07)
Basophils Absolute: 0 10*3/uL (ref 0.0–0.1)
Basophils Relative: 0 %
Eosinophils Absolute: 0 10*3/uL (ref 0.0–0.5)
Eosinophils Relative: 0 %
HCT: 47.6 % — ABNORMAL HIGH (ref 36.0–46.0)
Hemoglobin: 16.3 g/dL — ABNORMAL HIGH (ref 12.0–15.0)
Immature Granulocytes: 0 %
Lymphocytes Relative: 36 %
Lymphs Abs: 1.7 10*3/uL (ref 0.7–4.0)
MCH: 30.8 pg (ref 26.0–34.0)
MCHC: 34.2 g/dL (ref 30.0–36.0)
MCV: 90 fL (ref 80.0–100.0)
Monocytes Absolute: 0.5 10*3/uL (ref 0.1–1.0)
Monocytes Relative: 10 %
Neutro Abs: 2.4 10*3/uL (ref 1.7–7.7)
Neutrophils Relative %: 54 %
Platelet Count: 121 10*3/uL — ABNORMAL LOW (ref 150–400)
RBC: 5.29 MIL/uL — ABNORMAL HIGH (ref 3.87–5.11)
RDW: 13.1 % (ref 11.5–15.5)
WBC Count: 4.6 10*3/uL (ref 4.0–10.5)
nRBC: 0 % (ref 0.0–0.2)

## 2020-08-10 LAB — CMP (CANCER CENTER ONLY)
ALT: 31 U/L (ref 0–44)
AST: 46 U/L — ABNORMAL HIGH (ref 15–41)
Albumin: 3.7 g/dL (ref 3.5–5.0)
Alkaline Phosphatase: 80 U/L (ref 38–126)
Anion gap: 14 (ref 5–15)
BUN: 6 mg/dL — ABNORMAL LOW (ref 8–23)
CO2: 24 mmol/L (ref 22–32)
Calcium: 9 mg/dL (ref 8.9–10.3)
Chloride: 104 mmol/L (ref 98–111)
Creatinine: 0.73 mg/dL (ref 0.44–1.00)
GFR, Estimated: >60 mL/min (ref >60)
Glucose, Bld: 124 mg/dL — ABNORMAL HIGH (ref 70–99)
Potassium: 3.4 mmol/L — ABNORMAL LOW (ref 3.5–5.1)
Sodium: 142 mmol/L (ref 135–145)
Total Bilirubin: 0.6 mg/dL (ref 0.3–1.2)
Total Protein: 7.3 g/dL (ref 6.5–8.1)

## 2020-08-10 LAB — GENETIC SCREENING ORDER

## 2020-08-10 NOTE — Progress Notes (Signed)
Radiation Oncology         ([phone]) [phone] ________________________________  Initial Outpatient Consultation  Name: [name] MRN: [id number]  Date: 08/10/2020  DOB: [birth year]  CC:Paz, Alda Berthold, MD  Stark Klein, MD   REFERRING PHYSICIAN: Stark Klein, MD  DIAGNOSIS:    ICD-10-CM   1. Malignant neoplasm of upper-outer quadrant of left breast in female, estrogen receptor positive ([place])  C50.412    Z17.0     Cancer Staging Malignant neoplasm of upper-outer quadrant of left breast in female, estrogen receptor positive ([place]) Staging form: Breast, AJCC 8th Edition - Clinical stage from 07/26/2020: Stage IA (cT1b, cN0, cM0, G2, ER+, PR+, HER2-) - Signed by Truitt Merle, MD on 08/09/2020   CHIEF COMPLAINT: Here to discuss management of left breast cancer  HISTORY OF PRESENT ILLNESS::[name] is a 75 y.o. female who presented with possible abnormality on the following imaging: Anterior mass in left breast on diagnostic mammography.  The mass was lateral to her nipple at the 1:00 region.  Ultrasound of left breast on 07/04/2020 revealed an indeterminate mass at the 1 o'clock position.  This mass measured 8 mm.  Axillary region negative on ultrasound.  Biopsy on the date of 07/26/2020 showed invasive ductal carcinoma. ER status: 90% strong; PR status: 80% strong; Her2 status: negative; Grade: 2.  She is otherwise in her usual state of health.  She is here with her daughter today.  She has not been vaccinated for COVID thus far.  PREVIOUS RADIATION THERAPY: No  PAST MEDICAL HISTORY:  has a past medical history of Anxiety and depression, Arthritis, Blood transfusion without reported diagnosis (1989), Cataract, Coccyx pain, Coronary artery disease, Depression (2010), Dizziness, Eczema, Family history of breast cancer, Family history of kidney cancer, Family history of lung cancer, Family history of melanoma, Family history of stomach cancer, Family history of throat cancer, GERD  (gastroesophageal reflux disease), Goiter, Headache(784.0), Hyperlipidemia, Hypertension, Pain, Partial tear of right rotator cuff, S/P cardiac cath (2009), S/P cardiac cath (11/24/2014), and Stroke ([place]) (2004).    PAST SURGICAL HISTORY: Past Surgical History:  Procedure Laterality Date  . ABDOMINAL HYSTERECTOMY     oophorectomy L only  . BREAST CYST ASPIRATION Left   . BREAST CYST EXCISION Left   . BUNIONECTOMY  1990s   LEFT  . CHOLECYSTECTOMY    . HAND SURGERY  1990s   R hand x 2, L hand x 1  . LUMBAR LAMINECTOMY/DECOMPRESSION MICRODISCECTOMY N/A 11/13/2012   Procedure: MICRO LUMBAR DECOMPRESSION L4 - L5 AND L2 - L3 2 LEVELS;  Surgeon: Johnn Hai, MD;  Location: [facility];  Service: Orthopedics;  Laterality: N/A;  . SHOULDER SURGERY  2006   left     FAMILY HISTORY: family history includes Breast cancer in some other family members; Cataracts in her father; Heart attack in her half-brother; Heart disease in her half-brother and half-brother; Lung cancer in her brother and mother; Melanoma in her mother; Throat cancer in an other family member; Throat cancer (age of onset: 47) in her brother.  SOCIAL HISTORY:  reports that she quit smoking about 22 years ago. She has a 20.00 pack-year smoking history. She has never used smokeless tobacco. She reports that she does not drink alcohol and does not use drugs.  ALLERGIES: Codeine, Hydrocodone, and Sertraline hcl  MEDICATIONS:  Current Outpatient Medications  Medication Sig Dispense Refill  . ALPRAZolam (XANAX) 0.25 MG tablet Take 1-2 tablets (0.25-0.5 mg total) by mouth at bedtime as needed for anxiety  or sleep. 30 tablet 0  . amLODipine (NORVASC) 10 MG tablet Take 1 tablet (10 mg total) by mouth daily. 90 tablet 1  . pantoprazole (PROTONIX) 20 MG tablet Take 1 tablet (20 mg total) by mouth daily before breakfast. 30 tablet 6  . simvastatin (ZOCOR) 20 MG tablet Take 1 tablet (20 mg total) by mouth at bedtime. 90 tablet 1   No current  facility-administered medications for this encounter.    REVIEW OF SYSTEMS:  As above   PHYSICAL EXAM:  vitals were not taken for this visit.   General: Alert and oriented, in no acute distress HEENT: Head is normocephalic. Heart: Regular in rate and rhythm with no murmurs, rubs, or gallops. Chest: Clear to auscultation bilaterally, with no rhonchi, wheezes, or rales. Abdomen: Soft, nontender, nondistended, with no rigidity or guarding. Musculoskeletal: Ambulatory Neurologic:  Speech is fluent. Coordination is intact. Psychiatric: Judgment and insight are intact. Affect is appropriate. Breasts: Postbiopsy changes in left breast. No  palpable masses appreciated in the breasts or axillae bilaterally.    ECOG = 1  0 - Asymptomatic (Fully active, able to carry on all predisease activities without restriction)  1 - Symptomatic but completely ambulatory (Restricted in physically strenuous activity but ambulatory and able to carry out work of a light or sedentary nature. For example, light housework, office work)  2 - Symptomatic, <50% in bed during the day (Ambulatory and capable of all self care but unable to carry out any work activities. Up and about more than 50% of waking hours)  3 - Symptomatic, >50% in bed, but not bedbound (Capable of only limited self-care, confined to bed or chair 50% or more of waking hours)  4 - Bedbound (Completely disabled. Cannot carry on any self-care. Totally confined to bed or chair)  5 - Death   Eustace Pen MM, Creech RH, Tormey DC, et al. [phone]). "Toxicity and response criteria of the [facility]". [place] Oncol. 5 (6): 649-55   LABORATORY DATA:  Lab Results  Component Value Date   WBC 4.6 08/10/2020   HGB 16.3 (H) 08/10/2020   HCT 47.6 (H) 08/10/2020   MCV 90.0 08/10/2020   PLT 121 (L) 08/10/2020   CMP     Component Value Date/Time   NA 142 08/10/2020 1243   K 3.4 (L) 08/10/2020 1243   CL 104 08/10/2020 1243   CO2  24 08/10/2020 1243   GLUCOSE 124 (H) 08/10/2020 1243   GLUCOSE 131 (H) 06/19/2006 1135   BUN 6 (L) 08/10/2020 1243   CREATININE 0.73 08/10/2020 1243   CREATININE 0.80 04/27/2020 1056   CALCIUM 9.0 08/10/2020 1243   PROT 7.3 08/10/2020 1243   ALBUMIN 3.7 08/10/2020 1243   AST 46 (H) 08/10/2020 1243   ALT 31 08/10/2020 1243   ALKPHOS 80 08/10/2020 1243   BILITOT 0.6 08/10/2020 1243   GFRNONAA >60 08/10/2020 1243   GFRAA >90 11/21/2012 0045         RADIOGRAPHY: DG Chest 2 View  Result Date: 07/21/2020 CLINICAL DATA:  Pneumonia EXAM: CHEST - 2 VIEW COMPARISON:  06/13/2020, 10/30/2012 FINDINGS: Coarse reticular opacity in the right upper lobe persists. No focal consolidation or pleural effusion. Stable cardiomediastinal silhouette with aortic atherosclerosis. No pneumothorax. IMPRESSION: Reticular opacity in the right upper lobe persists and is unchanged since most recent prior but new compared to 2014. Unclear if this is due to unresolved infectious or inflammatory process versus chronic interstitial lung disease and fibrosis. Consider chest CT for  further evaluation. Electronically Signed   By: Donavan Foil M.D.   On: 07/21/2020 23:43   MM CLIP PLACEMENT LEFT  Result Date: 07/26/2020 CLINICAL DATA:  Status post ultrasound guided core needle biopsy left breast mass 1 o'clock position. EXAM: DIAGNOSTIC LEFT MAMMOGRAM POST ULTRASOUND BIOPSY COMPARISON:  Previous exam(s). FINDINGS: Mammographic images were obtained following ultrasound guided biopsy of left breast mass 1 o'clock position. The biopsy marking clip is in expected position at the site of biopsy. IMPRESSION: Appropriate positioning of the ribbon shaped biopsy marking clip at the site of biopsy in the left breast 1 o'clock position. Final Assessment: Post Procedure Mammograms for Marker Placement Electronically Signed   By: Lovey Newcomer M.D.   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 This was found to be concordant by Dr. Lovey Newcomer. Pathology results were discussed with the patient by telephone. The patient reported doing well after the biopsy with tenderness at the site. Post biopsy instructions and care were reviewed and questions were answered. The patient was encouraged to call [facility] for any additional concerns. My direct phone number was provided. The patient was referred to [facility] on August 03, 2020. Pathology results reported by Terie Purser, RN on 07/27/2020. Electronically Signed   By: Lovey Newcomer M.D.   On: 07/27/2020 12:45   Result Date: 07/27/2020 CLINICAL DATA:  Patient with indeterminate left breast mass 1 o'clock position. EXAM: ULTRASOUND GUIDED LEFT BREAST CORE NEEDLE BIOPSY COMPARISON:  Previous exam(s). PROCEDURE: I met with the patient and we discussed the procedure of ultrasound-guided biopsy, including benefits and alternatives. We discussed the high likelihood of a successful procedure. We discussed the risks of the procedure, including infection, bleeding, tissue injury, clip migration, and inadequate sampling. Informed written consent was given. The usual time-out protocol was performed immediately prior to the procedure. Lesion quadrant: Upper outer quadrant Using sterile technique and 1% Lidocaine as local anesthetic, under direct ultrasound visualization, a 14 gauge spring-loaded device was used to perform biopsy of left breast mass 1 o'clock position using a lateral approach. At the conclusion of the procedure ribbon shaped tissue marker clip was deployed into the biopsy cavity. Follow up 2 view mammogram was performed and dictated separately. IMPRESSION: Ultrasound guided biopsy of left breast  mass 1 o'clock position. No apparent complications. Electronically Signed: By: Lovey Newcomer M.D. On: 07/26/2020 14:23      IMPRESSION/PLAN: Left breast cancer  She anticipates breast conserving surgery  For the patient's early stage favorable risk breast cancer, we had a thorough discussion about her options for adjuvant therapy. One option would be antiestrogen therapy as discussed with medical oncology. She would take a pill for approximately 5 years.  She is enthusiastic about taking this pill after talking with medical oncology.  The more aggressive option would be to pursue both the pill and adjuvant radiation (but I told her I thought this would be more therapy than necessary).   Of note, I discussed the data from the [employer] al trial in the [place] [facility]. She understands that tamoxifen compared to radiation plus tamoxifen demonstrated no survival benefit among the women in this study. The women were 70 years  or older with stage I estrogen receptor positive breast cancer. Extrapolating from this study, I told Ms. Tregre that her overall life expectancy should not be affected by adding radiotherapy to antiestrogen medication. She understands that the main benefit of radiotherapy would be a very small but measurable local control benefit (risk of local recurrence to be lowered from ~9% --> ~2%).    I think it is reasonable for her to proceed with breast conserving surgery followed by the antiestrogen pill.  She would prefer to take this approach as well.  She is not keen on pursuing radiation therapy unless her surgical results show more advanced disease than anticipated.  I will see her back on an as-needed basis and certainly happy to rediscuss radiation with her if her pathologic staging increases significantly or there is difficulty clearing her margins.   We discussed measures to reduce the risk of infection during the COVID-19 pandemic.  I understand that she has not  been vaccinated thus far.  I offered to talk with her about why I recommend she get vaccinated.  She expressed that she feels very strongly that she has made up her mind and does not wish to discuss the vaccine.  I did encourage her to keep wearing high-quality masks and she states she is committed to doing so.  On date of service, in total, I spent 45 minutes on this encounter. Patient was seen in person.   __________________________________________   Eppie Gibson, MD  This document serves as a record of services personally performed by Eppie Gibson, MD. It was created on his behalf by Clerance Lav, a trained medical scribe. The creation of this record is based on the scribe's personal observations and the provider's statements to them. This document has been checked and approved by the attending provider.

## 2020-08-10 NOTE — Progress Notes (Signed)
[place]   [phone] Fax:([phone]) [place] Note   Patient Care Team: Colon Branch, MD as PCP - General [name], Nadara Mustard, MD as Consulting Physician (Gynecology) Ladene Artist, MD as Consulting Physician (Gastroenterology) Erroll Luna, MD as Consulting Physician (General Surgery) Pedro Earls, MD as Attending Physician (Orthopedic Surgery) Netta Cedars, MD as Consulting Physician (Orthopedic Surgery) Linda Hedges, DO as Consulting Physician (Obstetrics and Gynecology) Day, Melvenia Beam, [facility] (Pharmacist) Day, Melvenia Beam, [facility] as Pharmacist (Pharmacist) Mauro Kaufmann, RN as Oncology Nurse Navigator Rockwell Germany, RN as Oncology Nurse Navigator Stark Klein, MD as Consulting Physician (General Surgery) Truitt Merle, MD as Consulting Physician (Hematology) Eppie Gibson, MD as Attending Physician (Radiation Oncology)  Date of Service:  08/10/2020   CHIEF COMPLAINTS/PURPOSE OF CONSULTATION:  Newly Diagnosed Malignant neoplasm of upper-outer quadrant of left breast    Oncology History Overview Note  Cancer Staging Malignant neoplasm of upper-outer quadrant of left breast in female, estrogen receptor positive ([place]) Staging form: Breast, AJCC 8th Edition - Clinical stage from 07/26/2020: Stage IA (cT1b, cN0, cM0, G2, ER+, PR+, HER2-) - Signed by Truitt Merle, MD on 08/09/2020    Malignant neoplasm of upper-outer quadrant of left breast in female, estrogen receptor positive ([place])  07/04/2020 Mammogram   IMPRESSION: Indeterminate 4 x 8 x 5 mm irregular hypoechoic mass left breast 1 o'clock position 4 cm from the nipple.   07/26/2020 Cancer Staging   Staging form: Breast, AJCC 8th Edition - Clinical stage from 07/26/2020: Stage IA (cT1b, cN0, cM0, G2, ER+, PR+, HER2-) - Signed by Truitt Merle, MD on 08/09/2020   07/26/2020 Initial Biopsy   Diagnosis Breast, left, needle core biopsy, upper outer 1 o'clock - INVASIVE DUCTAL CARCINOMA - SEE  COMMENT Microscopic Comment Based on the biopsy, the carcinoma appears Nottingham grade 2 of 3 and measures 0.7 cm in greatest linear extent. Prognostic markers (ER/PR/ki-67/HER2) are pending and will be reported in an addendum. Dr. Jeannie Done reviewed the case and agrees with the above diagnosis. These results were called to [facility] on July 27, 2018.   07/26/2020 Receptors her2   PROGNOSTIC INDICATORS Results: IMMUNOHISTOCHEMICAL AND MORPHOMETRIC ANALYSIS PERFORMED MANUALLY The tumor cells are NEGATIVE for Her2 (1+). Estrogen Receptor: 90%, POSITIVE, STRONG STAINING INTENSITY Progesterone Receptor: 80%, POSITIVE, STRONG STAINING INTENSITY Proliferation Marker Ki67: 2%   08/03/2020 Initial Diagnosis   Malignant neoplasm of upper-outer quadrant of left breast in female, estrogen receptor positive ([place])      HISTORY OF PRESENTING ILLNESS:  [name] 75 y.o. female is a here because of newly diagnosed left beast cancer. The patient presents to the Breast clinic today accompanied by her daughter.  Her left breast was found by screening mammogram. She did not feel the mass herself. Her biopsy should ductal carcinoma. She notes prior to biopsy she felt mild twinge of pain in left breast but no other new concerning symptoms of breast, skin.  Today she notes since her breast cancer diagnosis she has been feeling nervous with headaches, Nausea, diarrhea for the past 2-3 weeks. She has not been eating as much and lost 7 pounds in the last week. She notes normal BM and urination. She has been having cough. She did have PNA in 2021 and has had productive cough since then. Her Chest Xray from 07/21/20 showed some right lung changes. She will continue to f/u with her PCP. She does have cough medication and nerve pills to help. She  plans to have CT chest for follow up soon. She has skin redness and dryness around her nose. She attributes most to her mask. She notes she has not  had any COVID vaccine and does not want it.   Socially she lives with her youngest son. She does mostly everything herself and will get help as needed. She has 4 children. She smoked for 22 years 1-1.5ppd. She has quit years ago. She is retired.  She has a PMHx of HTN, HLD, GERD, Anxiety. She notes she was told she had mini stroke in the past, but denies symptoms. I reviewed her medication list with her. She has had multiple MSK surgeries and hysterectomy. She has chronic intermittent pain in her back. She notes a cousin had breast cancer and another cousin had breast cancer.     GYN HISTORY  Menarchal: 9 LMP: 1989 by hysterectomy and uni-salpingo  Contraceptive: No HRT: Yes (estrogen) for 25 years, stopped 10 years ago GP4: first at age 27    REVIEW OF SYSTEMS:    Constitutional: Denies fevers, chills or abnormal night sweats Eyes: Denies blurriness of vision, double vision or watery eyes Ears, nose, mouth, throat, and face: Denies mucositis or sore throat Respiratory: Denies dyspnea or wheezes (+) Cough  Cardiovascular: Denies palpitation, chest discomfort or lower extremity swelling Gastrointestinal:  Denies nausea, heartburn or change in bowel habits Skin: Denies abnormal skin rashes Lymphatics: Denies new lymphadenopathy or easy bruising Neurological:Denies numbness, tingling or new weaknesses Behavioral/Psych: Mood is stable, no new changes (+) Anxiety  Breast; (+) Left breast twinge of pain  All other systems were reviewed with the patient and are negative.  MEDICAL HISTORY:  Past Medical History:  Diagnosis Date  . Anxiety and depression   . Arthritis   . Blood transfusion without reported diagnosis 1989   GB hemorrage   . Cataract    Bil/no surgery yet.  . Coccyx pain    Secondary to scar tissue from old pressure ulcer after back surgery, Dr. Brantley Stage  . Coronary artery disease   . Depression 2010   panic attacks and depression  . Dizziness    severe: MRI chronic  isch. changes and mastoiditis- saw Dr.Mundy(2007)  STATES SHE STILL HAS EPISODES- ESPECIALLY IF SHE GETS UP TOO QUICKLY AFTER LYING DOWN  . Eczema   . Family history of breast cancer   . Family history of kidney cancer   . Family history of lung cancer   . Family history of melanoma   . Family history of stomach cancer   . Family history of throat cancer   . GERD (gastroesophageal reflux disease)   . Goiter   . Headache(784.0)   . Hyperlipidemia   . Hypertension   . Pain    PAIN LOWER BACK AND DOWN RIGHT LEG WITH NUMBNESS, TINGLING RT LEG AND FOOT--STENOSIS  . Partial tear of right rotator cuff    & labral tear  . S/P cardiac cath 2009   Revealing nonobstructive CAD w/ tubular, discrete 40% mid lesion in the circumflex; discrete 30% proximal lesion in the LAD; luminal irregularities, 35% mid lesion in RCA; and generic 40% mid lesion in the RCA. Medical treatment recommended.  . S/P cardiac cath 11/24/2014   Revealing nonobstructive CAD w/ tubular, discrete 40% mid lesion in the circumflex; discrete 30% proximal lesion in the LAD; luminal irregularities, 35% mid lesion in RCA; and generic 40% mid lesion in the RCA. Medical treatment recommended.  . Stroke [facility]) 2004   mild TIA  SURGICAL HISTORY: Past Surgical History:  Procedure Laterality Date  . ABDOMINAL HYSTERECTOMY     oophorectomy L only  . BREAST CYST ASPIRATION Left   . BREAST CYST EXCISION Left   . BUNIONECTOMY  1990s   LEFT  . CHOLECYSTECTOMY    . HAND SURGERY  1990s   R hand x 2, L hand x 1  . LUMBAR LAMINECTOMY/DECOMPRESSION MICRODISCECTOMY N/A 11/13/2012   Procedure: MICRO LUMBAR DECOMPRESSION L4 - L5 AND L2 - L3 2 LEVELS;  Surgeon: Johnn Hai, MD;  Location: [facility];  Service: Orthopedics;  Laterality: N/A;  . SHOULDER SURGERY  2006   left     SOCIAL HISTORY: Social History   Socioeconomic History  . Marital status: Widowed    Spouse name: Not on file  . Number of children: 4  . Years of education:  Not on file  . Highest education level: Not on file  Occupational History  . Occupation: retired     Fish farm manager: RETIRED  Tobacco Use  . Smoking status: Former Smoker    Packs/day: 1.00    Years: 20.00    Pack years: 20.00    Quit date: 2000    Years since quitting: 22.0  . Smokeless tobacco: Never Used  . Tobacco comment: quit aprox 2000 after 30 years, 1 to 1.5 ppd  Substance and Sexual Activity  . Alcohol use: No  . Drug use: No  . Sexual activity: Not Currently  Other Topics Concern  . Not on file  Social History Narrative   Lost husband [name]) 12/2018; 2 daughters. 2 sons    youngest son lives w/her    Social Determinants of Health   Financial Resource Strain: Low Risk   . Difficulty of Paying Living Expenses: Not hard at all  Food Insecurity: No Food Insecurity  . Worried About Charity fundraiser in the Last Year: Never true  . Ran Out of Food in the Last Year: Never true  Transportation Needs: No Transportation Needs  . Lack of Transportation (Medical): No  . Lack of Transportation (Non-Medical): No  Physical Activity: Inactive  . Days of Exercise per Week: 0 days  . Minutes of Exercise per Session: 0 min  Stress: Stress Concern Present  . Feeling of Stress : To some extent  Social Connections: Socially Isolated  . Frequency of Communication with Friends and Family: More than three times a week  . Frequency of Social Gatherings with Friends and Family: More than three times a week  . Attends Religious Services: Never  . Active Member of Clubs or Organizations: No  . Attends Archivist Meetings: Never  . Marital Status: Widowed  Intimate Partner Violence: Not At Risk  . Fear of Current or Ex-Partner: No  . Emotionally Abused: No  . Physically Abused: No  . Sexually Abused: No    FAMILY HISTORY: Family History  Problem Relation Age of Onset  . Breast cancer Other        2 cousins  . Throat cancer Other        cousin  . Heart attack Half-Brother         2 brother s, CABG  . Heart disease Half-Brother   . Lung cancer Mother        dx late 75s, cousin  . Melanoma Mother        multiple, first diagnosed 5s  . Lung cancer Brother   . Throat cancer Brother 73  . Cataracts Father   .  Heart disease Half-Brother   . Breast cancer Other   . Colon cancer Neg Hx   . Diabetes Neg Hx     ALLERGIES:  is allergic to codeine, hydrocodone, and sertraline hcl.  MEDICATIONS:  Current Outpatient Medications  Medication Sig Dispense Refill  . ALPRAZolam (XANAX) 0.25 MG tablet Take 1-2 tablets (0.25-0.5 mg total) by mouth at bedtime as needed for anxiety or sleep. 30 tablet 0  . amLODipine (NORVASC) 10 MG tablet Take 1 tablet (10 mg total) by mouth daily. 90 tablet 1  . pantoprazole (PROTONIX) 20 MG tablet Take 1 tablet (20 mg total) by mouth daily before breakfast. 30 tablet 6  . simvastatin (ZOCOR) 20 MG tablet Take 1 tablet (20 mg total) by mouth at bedtime. 90 tablet 1   No current facility-administered medications for this visit.    PHYSICAL EXAMINATION: ECOG PERFORMANCE STATUS: 1 - Symptomatic but completely ambulatory  Vitals:   08/10/20 1309  BP: (!) 119/96  Pulse: 91  Resp: 19  Temp: (!) 97.3 F (36.3 C)  SpO2: 99%   Filed Weights   08/10/20 1309  Weight: 176 lb 9.6 oz (80.1 kg)    GENERAL:alert, no distress and comfortable SKIN: skin color, texture, turgor are normal, no rashes or significant lesions EYES: normal, Conjunctiva are pink and non-injected, sclera clear  NECK: supple, thyroid normal size, non-tender, without nodularity LYMPH:  no palpable lymphadenopathy in the cervical, axillary  LUNGS: clear to auscultation and percussion with normal breathing effort HEART: regular rate & rhythm and no murmurs and no lower extremity edema ABDOMEN:abdomen soft, non-tender and normal bowel sounds Musculoskeletal:no cyanosis of digits and no clubbing  NEURO: alert & oriented x 3 with fluent speech, no focal motor/sensory  deficits BREAST: (+) Skin erythema under right breast skin fold (+) Moderate Skin ecchymosis of left breast with 1.5cm lump at 1:00 position, 2cmfn at biopsy site.   LABORATORY DATA:  I have reviewed the data as listed CBC Latest Ref Rng & Units 08/10/2020 04/27/2020 04/28/2019  WBC 4.0 - 10.5 K/uL 4.6 8.8 8.1  Hemoglobin 12.0 - 15.0 g/dL 16.3(H) 15.5 15.2(H)  Hematocrit 36.0 - 46.0 % 47.6(H) 46.7(H) 45.6  Platelets 150 - 400 K/uL 121(L) 181 190.0    CMP Latest Ref Rng & Units 08/10/2020 04/27/2020 10/27/2019  Glucose 70 - 99 mg/dL 124(H) 110(H) 111(H)  BUN 8 - 23 mg/dL 6(L) 12 10  Creatinine 0.44 - 1.00 mg/dL 0.73 0.80 0.66  Sodium 135 - 145 mmol/L 142 139 140  Potassium 3.5 - 5.1 mmol/L 3.4(L) 4.2 4.2  Chloride 98 - 111 mmol/L 104 102 103  CO2 22 - 32 mmol/L 24 27 28   Calcium 8.9 - 10.3 mg/dL 9.0 9.8 9.4  Total Protein 6.5 - 8.1 g/dL 7.3 7.0 -  Total Bilirubin 0.3 - 1.2 mg/dL 0.6 0.6 -  Alkaline Phos 38 - 126 U/L 80 - -  AST 15 - 41 U/L 46(H) 26 -  ALT 0 - 44 U/L 31 20 -     RADIOGRAPHIC STUDIES: I have personally reviewed the radiological images as listed and agreed with the findings in the report. DG Chest 2 View  Result Date: 07/21/2020 CLINICAL DATA:  Pneumonia EXAM: CHEST - 2 VIEW COMPARISON:  06/13/2020, 10/30/2012 FINDINGS: Coarse reticular opacity in the right upper lobe persists. No focal consolidation or pleural effusion. Stable cardiomediastinal silhouette with aortic atherosclerosis. No pneumothorax. IMPRESSION: Reticular opacity in the right upper lobe persists and is unchanged since most recent prior but new compared  to 2014. Unclear if this is due to unresolved infectious or inflammatory process versus chronic interstitial lung disease and fibrosis. Consider chest CT for further evaluation. Electronically Signed   By: Donavan Foil M.D.   On: 07/21/2020 23:43   MM CLIP PLACEMENT LEFT  Result Date: 07/26/2020 CLINICAL DATA:  Status post ultrasound guided core needle  biopsy left breast mass 1 o'clock position. EXAM: DIAGNOSTIC LEFT MAMMOGRAM POST ULTRASOUND BIOPSY COMPARISON:  Previous exam(s). FINDINGS: Mammographic images were obtained following ultrasound guided biopsy of left breast mass 1 o'clock position. The biopsy marking clip is in expected position at the site of biopsy. IMPRESSION: Appropriate positioning of the ribbon shaped biopsy marking clip at the site of biopsy in the left breast 1 o'clock position. Final Assessment: Post Procedure Mammograms for Marker Placement Electronically Signed   By: Lovey Newcomer M.D.   On: 07/26/2020 14:25   [place] LT BREAST BX W LOC DEV 1ST LESION IMG BX SPEC US GUIDE  Addendum Date: 07/27/2020   ADDENDUM REPORT: 07/27/2020 12:45 ADDENDUM: Pathology revealed GRADE II INVASIVE DUCTAL CARCINOMA of the Left breast, upper outer, 1 o'clock. This was found to be concordant by Dr. Lovey Newcomer. Pathology results were discussed with the patient by telephone. The patient reported doing well after the biopsy with tenderness at the site. Post biopsy instructions and care were reviewed and questions were answered. The patient was encouraged to call [facility] for any additional concerns. My direct phone number was provided. The patient was referred to [facility] on August 03, 2020. Pathology results reported by Terie Purser, RN on 07/27/2020. Electronically Signed   By: Lovey Newcomer M.D.   On: 07/27/2020 12:45   Result Date: 07/27/2020 CLINICAL DATA:  Patient with indeterminate left breast mass 1 o'clock position. EXAM: ULTRASOUND GUIDED LEFT BREAST CORE NEEDLE BIOPSY COMPARISON:  Previous exam(s). PROCEDURE: I met with the patient and we discussed the procedure of ultrasound-guided biopsy, including benefits and alternatives. We discussed the high likelihood of a successful procedure. We discussed the risks of the procedure, including infection,  bleeding, tissue injury, clip migration, and inadequate sampling. Informed written consent was given. The usual time-out protocol was performed immediately prior to the procedure. Lesion quadrant: Upper outer quadrant Using sterile technique and 1% Lidocaine as local anesthetic, under direct ultrasound visualization, a 14 gauge spring-loaded device was used to perform biopsy of left breast mass 1 o'clock position using a lateral approach. At the conclusion of the procedure ribbon shaped tissue marker clip was deployed into the biopsy cavity. Follow up 2 view mammogram was performed and dictated separately. IMPRESSION: Ultrasound guided biopsy of left breast mass 1 o'clock position. No apparent complications. Electronically Signed: By: Lovey Newcomer M.D. On: 07/26/2020 14:23    ASSESSMENT & PLAN:  [name] is a 75 y.o. Caucasian female with a history of Anxiety/Depression, CAD, GERD, HLD, HTN, mild stroke in 2004.    1. Malignant neoplasm of upper-outer quadrant of left breast, Stage IA, c(T1bN0M0), ER+/PR+/HER2-, Grade II -We discussed her image findings and the biopsy results in great details. She has 80m mass in left breast and biopsy confirmed invasive ductal carcinoma on biopsy.  -Given the early stage disease, she likely need a lumpectomy. No Lymph node resection recommended given early stage and advanced age. She is agreeable with that. She was seen by Dr. BBarry Dienestoday and likely will proceed with surgery soon.  -If her  surgical path shows mass is 1cm or larger, I recommend a Oncotype Dx test on the surgical sample and we'll make a decision about adjuvant chemotherapy based on the Oncotype result. Written material of this test was given to her. She is interested in Oncotype testing if indicated.  -If her surgical sentinel lymph node positive, I recommend mammaprint for further risk stratification and guide adjuvant chemotherapy. -The risk of recurrence depends on the stage and biology of the  tumor. She is early stage, with ER/PR positive and HER2 negative markers. I discussed this is the more common type of slow growing tumor.  -She was also seen by radiation oncologist Dr. Isidore Moos today. Adjuvant radiation is recommended after lumpectomy to reduce her risk of local recurrence.   -Given the strong ER and PR expression in her postmenopausal status, I recommend adjuvant endocrine therapy with aromatase inhibitor or Tamoxifen for a total of 5-10 years to reduce the risk of cancer recurrence. Potential benefits and side effects were discussed with patient and she is interested. Given her Osteopenia and chronic back pain I recommend she start with Tamoxifen.  -We also discussed the breast cancer surveillance after her surgery. She will continue annual screening mammogram, self exam, and a routine office visit with lab and exam with [place]. -Initial exam today showed 1.5 cm lump and skin bruising at biopsy site. Labs reviewed, Hg 16.3, plt 121K, K 3.4, BG 124, AST 46. I discussed her elevated Hg is likely related to her h/o smoking. Will monitor platelet level. I also encouraged her to increase potassium in diet.  -F/u after surgery or radiation based on path.    2. Cough S/p PNA in 2021 -She notes having PNA in fall 2021 and since has had aggressive cough with phlegm.  -She uses cough medication and nerve medication to help  -Her 07/21/20 Chest Xray shows changes in her right lung. She plans to monitor with CT Chest soon with PCP -She does have history of smoking for 22 years. She notes she does not plan to have any COVID vaccine.    3. Comorbidities: HTN, HLD, GERD, Anxiety, Chronic back pain (manageable)  -On medication (Xanax, Amlodipine, Celebrex, Flexeril, Protonix, Prednisone). Continue to f/u with PCP    4. Genetics  -She has 2 first cousins with Breast cancer. She is eligible for genetic testing. She will proceed with testing today, she is agreeable.    5. Osteopenia  -Her 06/2019  DEXA shows osteopenia at forearm radius with T-score -1.6. She taking Vit D3.   -I discussed if she proceeds with Tamoxifen this can help strengthen her bones.    PLAN:  -Genetics today  -Proceed with surgery soon  -if her tumor>1.0cm, will obtain Oncotype  -F/u after Radiation or sooner if needed    No orders of the defined types were placed in this encounter.   All questions were answered. The patient knows to call the clinic with any problems, questions or concerns. The total time spent in the appointment was 45 minutes.     Truitt Merle, MD 08/10/2020   I, Joslyn Devon, am acting as scribe for Truitt Merle, MD.   I have reviewed the above documentation for accuracy and completeness, and I agree with the above.

## 2020-08-10 NOTE — Progress Notes (Signed)
[place] Psychosocial Distress Screening Spiritual Care  Met with [name] and her daughter in [facility] to introduce [name] team/resources, reviewing distress screen per protocol.  The patient scored a 8 on the Psychosocial Distress Thermometer which indicates severe distress. Also assessed for distress and other psychosocial needs.   ONCBCN DISTRESS SCREENING 08/10/2020  Screening Type Initial Screening  Distress experienced in past week (1-10) 8  Practical problem type Transportation  Emotional problem type Depression;Nervousness/Anxiety;Adjusting to illness  Physical Problem type Breathing;Loss of appetitie;Changes in urination  Referral to support programs Yes   [name] reports that learning the scope of her diagnosis and treatment plan at [facility] helped reduce her distress to a 6. She reports good support from her children, two daughters and two sons, as well as a prayer circle at church. Provided empathic listening, normalization of feelings, and introduction to [facility] support team and programming.  Follow up needed: No. [name] prefers to contact chaplain herself following surgery for further support.   [place], [place], [facility] Pager [phone] Voicemail [phone]

## 2020-08-11 ENCOUNTER — Encounter: Payer: Self-pay | Admitting: Genetic Counselor

## 2020-08-11 ENCOUNTER — Other Ambulatory Visit: Payer: Self-pay | Admitting: General Surgery

## 2020-08-11 DIAGNOSIS — Z170 Estrogen receptor positive status [ER+]: Secondary | ICD-10-CM

## 2020-08-11 NOTE — Progress Notes (Signed)
REFERRING PROVIDER: Truitt Merle, MD [place],  [place] [address]  PRIMARY PROVIDER:  Colon Branch, MD  PRIMARY REASON FOR VISIT:  1. Malignant neoplasm of upper-outer quadrant of left breast in female, estrogen receptor positive ([place])   2. Family history of breast cancer   3. Family history of melanoma   4. Family history of throat cancer   5. Family history of lung cancer   6. Family history of kidney cancer   7. Family history of stomach cancer      I connected with [name] on 08/10/2020 at 3:00 pm EDT by video conference and verified that I am speaking with the correct person using two identifiers.   Patient location: [facility] Provider location: [place] [facility]  HISTORY OF PRESENT ILLNESS:   [name], a 75 y.o. female, was seen for a [place] cancer genetics consultation at the request of Dr. Burr Medico due to a personal and family history of cancer.  [name] presents to clinic today to discuss the possibility of a hereditary predisposition to cancer, genetic testing, and to further clarify her future cancer risks, as well as potential cancer risks for family members.   In January 2022, at the age of 75, [name] was diagnosed with invasive ductal carcinoma of the left breast. The tumor is ER+/PR+/Her2-. The treatment plan includes surgery, oncotype as indicated, mammaprint as indicated, radiation therapy, and antiestrogen therapy.   CANCER HISTORY:  Oncology History Overview Note  Cancer Staging Malignant neoplasm of upper-outer quadrant of left breast in female, estrogen receptor positive ([place]) Staging form: Breast, AJCC 8th Edition - Clinical stage from 07/26/2020: Stage IA (cT1b, cN0, cM0, G2, ER+, PR+, HER2-) - Signed by Truitt Merle, MD on 08/09/2020    Malignant neoplasm of upper-outer quadrant of left breast in female, estrogen receptor positive ([place])  07/04/2020 Mammogram    IMPRESSION: Indeterminate 4 x 8 x 5 mm irregular hypoechoic mass left breast 1 o'clock position 4 cm from the nipple.   07/26/2020 Cancer Staging   Staging form: Breast, AJCC 8th Edition - Clinical stage from 07/26/2020: Stage IA (cT1b, cN0, cM0, G2, ER+, PR+, HER2-) - Signed by Truitt Merle, MD on 08/09/2020   07/26/2020 Initial Biopsy   Diagnosis Breast, left, needle core biopsy, upper outer 1 o'clock - INVASIVE DUCTAL CARCINOMA - SEE COMMENT Microscopic Comment Based on the biopsy, the carcinoma appears Nottingham grade 2 of 3 and measures 0.7 cm in greatest linear extent. Prognostic markers (ER/PR/ki-67/HER2) are pending and will be reported in an addendum. Dr. Jeannie Done reviewed the case and agrees with the above diagnosis. These results were called to [facility] on July 27, 2018.   07/26/2020 Receptors her2   PROGNOSTIC INDICATORS Results: IMMUNOHISTOCHEMICAL AND MORPHOMETRIC ANALYSIS PERFORMED MANUALLY The tumor cells are NEGATIVE for Her2 (1+). Estrogen Receptor: 90%, POSITIVE, STRONG STAINING INTENSITY Progesterone Receptor: 80%, POSITIVE, STRONG STAINING INTENSITY Proliferation Marker Ki67: 2%   08/03/2020 Initial Diagnosis   Malignant neoplasm of upper-outer quadrant of left breast in female, estrogen receptor positive ([place])     RISK FACTORS:  Menarche was at age 74.  First live birth at age 62.  OCP use for approximately 0 years.  Ovaries intact: yes, left oophorectomy.  Hysterectomy: yes.  Menopausal status: postmenopausal.  HRT use: yes (estrogen) 25 years, stopped 10 years ago. Colonoscopy: yes; 02/04/2018. Mammogram within the last year: yes.   Past Medical History:  Diagnosis Date  .  Anxiety and depression   . Arthritis   . Blood transfusion without reported diagnosis 1989   GB hemorrage   . Cataract    Bil/no surgery yet.  . Coccyx pain    Secondary to scar tissue from old pressure ulcer after back surgery, Dr. Brantley Stage  . Coronary artery  disease   . Depression 2010   panic attacks and depression  . Dizziness    severe: MRI chronic isch. changes and mastoiditis- saw Dr.Mundy(2007)  STATES SHE STILL HAS EPISODES- ESPECIALLY IF SHE GETS UP TOO QUICKLY AFTER LYING DOWN  . Eczema   . Family history of breast cancer   . Family history of kidney cancer   . Family history of lung cancer   . Family history of melanoma   . Family history of stomach cancer   . Family history of throat cancer   . GERD (gastroesophageal reflux disease)   . Goiter   . Headache(784.0)   . Hyperlipidemia   . Hypertension   . Pain    PAIN LOWER BACK AND DOWN RIGHT LEG WITH NUMBNESS, TINGLING RT LEG AND FOOT--STENOSIS  . Partial tear of right rotator cuff    & labral tear  . S/P cardiac cath 2009   Revealing nonobstructive CAD w/ tubular, discrete 40% mid lesion in the circumflex; discrete 30% proximal lesion in the LAD; luminal irregularities, 35% mid lesion in RCA; and generic 40% mid lesion in the RCA. Medical treatment recommended.  . S/P cardiac cath 11/24/2014   Revealing nonobstructive CAD w/ tubular, discrete 40% mid lesion in the circumflex; discrete 30% proximal lesion in the LAD; luminal irregularities, 35% mid lesion in RCA; and generic 40% mid lesion in the RCA. Medical treatment recommended.  . Stroke [facility]) 2004   mild TIA    Past Surgical History:  Procedure Laterality Date  . ABDOMINAL HYSTERECTOMY     oophorectomy L only  . BREAST CYST ASPIRATION Left   . BREAST CYST EXCISION Left   . BUNIONECTOMY  1990s   LEFT  . CHOLECYSTECTOMY    . HAND SURGERY  1990s   R hand x 2, L hand x 1  . LUMBAR LAMINECTOMY/DECOMPRESSION MICRODISCECTOMY N/A 11/13/2012   Procedure: MICRO LUMBAR DECOMPRESSION L4 - L5 AND L2 - L3 2 LEVELS;  Surgeon: Johnn Hai, MD;  Location: [facility];  Service: Orthopedics;  Laterality: N/A;  . SHOULDER SURGERY  2006   left     Social History   Socioeconomic History  . Marital status: Widowed    Spouse name:  Not on file  . Number of children: 4  . Years of education: Not on file  . Highest education level: Not on file  Occupational History  . Occupation: retired     Fish farm manager: RETIRED  Tobacco Use  . Smoking status: Former Smoker    Packs/day: 1.00    Years: 20.00    Pack years: 20.00    Quit date: 2000    Years since quitting: 22.0  . Smokeless tobacco: Never Used  . Tobacco comment: quit aprox 2000 after 30 years, 1 to 1.5 ppd  Substance and Sexual Activity  . Alcohol use: No  . Drug use: No  . Sexual activity: Not Currently  Other Topics Concern  . Not on file  Social History Narrative   Lost husband [name]) 12/2018; 2 daughters. 2 sons    youngest son lives w/her    Social Determinants of Health   Financial Resource Strain: Low Risk   .  Difficulty of Paying Living Expenses: Not hard at all  Food Insecurity: No Food Insecurity  . Worried About Programme researcher, broadcasting/film/video in the Last Year: Never true  . Ran Out of Food in the Last Year: Never true  Transportation Needs: No Transportation Needs  . Lack of Transportation (Medical): No  . Lack of Transportation (Non-Medical): No  Physical Activity: Inactive  . Days of Exercise per Week: 0 days  . Minutes of Exercise per Session: 0 min  Stress: Stress Concern Present  . Feeling of Stress : To some extent  Social Connections: Socially Isolated  . Frequency of Communication with Friends and Family: More than three times a week  . Frequency of Social Gatherings with Friends and Family: More than three times a week  . Attends Religious Services: Never  . Active Member of Clubs or Organizations: No  . Attends Banker Meetings: Never  . Marital Status: Widowed     FAMILY HISTORY:  We obtained a detailed, 4-generation family history.  Significant diagnoses are listed below: Family History  Problem Relation Age of Onset  . Throat cancer Other        cousin  . Heart attack Half-Brother        2 brother s, CABG  . Heart  disease Half-Brother   . Lung cancer Mother        dx late 56s, cousin  . Melanoma Mother        multiple, first diagnosed 30s  . Lung cancer Brother   . Throat cancer Brother 81  . Cataracts Father   . Heart disease Half-Brother   . Kidney cancer Maternal Aunt 70  . Cancer Maternal Grandfather        NOS, dx 30s  . Stomach cancer Maternal Uncle   . Breast cancer Cousin        dx >50, maternal first cousin  . Breast cancer Cousin        dx >50, maternal first cousin  . Colon cancer Neg Hx   . Diabetes Neg Hx    [name] has two daughters and two sons (ages 55-60). She has one full-brother and three maternal half-brothers. Her full-brother died at age 69 from throat cancer (diagnosed age 25).   [name] mother died at age 46 and had a history of multiple melanomas first diagnosed in her 13s and lung cancer diagnosed age 4. [name] had one maternal aunt and four maternal uncles. One uncle died in his 67s from stomach cancer. Her aunt was diagnosed with kidney cancer at age 16. She has two maternal first cousins who were diagnosed with breast cancer older than 50. Her maternal grandmother died age 37 from a stroke, and her maternal grandfather died in his 30s from an unknown cancer.  [name] father is alive at age 54 and has not had cancer. She had multiple paternal aunts and uncles, although she does not have much information about these relatives. Her paternal grandparents died in their 56s without cancer.  [name] is unaware of previous family history of genetic testing for hereditary cancer risks. Patient's ancestors are of unknown descent. There is no reported Ashkenazi Jewish ancestry. There is no known consanguinity.  GENETIC COUNSELING ASSESSMENT: [name] is a 75 y.o. female with a personal history of breast cancer and a family history of breast cancer, melanoma, kidney cancer, and stomach cancer, which is somewhat suggestive of a hereditary  cancer syndrome and predisposition to cancer. We, therefore,  discussed and recommended the following at today's visit.   DISCUSSION: We discussed that approximately 5-10% of breast cancer is hereditary, with most cases associated with the BRCA1 and BRCA2 genes. There are other genes that can be associated with hereditary breast cancer syndromes. These include ATM, CHEK2, PALB2, etc. We discussed that testing is beneficial for several reasons, including knowing about other cancer risks, identifying potential screening and risk-reduction options that may be appropriate, and to understand if other family members could be at risk for cancer and allow them to undergo genetic testing.  We reviewed the characteristics, features and inheritance patterns of hereditary cancer syndromes. We also discussed genetic testing, including the appropriate family members to test, the process of testing, insurance coverage and turn-around-time for results. We discussed the implications of a negative, positive and/or variant of uncertain significant result. We recommended [name] pursue genetic testing for a hereditary cancer gene panel, such as the [facility] CancerNext-Expanded + RNAinsight panel.   Based on [name]'s personal and family history of cancer, she meets medical criteria for genetic testing. Despite that she meets criteria, there may still be an out of pocket cost.   PLAN:  [name] did not wish to pursue genetic testing at today's visit. We understand this decision and remain available to coordinate genetic testing at any time in the future. We, therefore, recommend [name] continue to follow the cancer screening guidelines given by her primary healthcare provider.  [name] questions were answered to her satisfaction today. Our contact information was provided should additional questions or concerns arise. Thank you for the referral and allowing [place] to share in the care of your patient.    Clint Guy, [place], [facility] Licensed, Certified Dispensing [email or web address] Phone: [phone]  The patient was seen for a total of 20 minutes in face-to-face genetic counseling.  This patient was discussed with Drs. Magrinat, Lindi Adie and/or Burr Medico who agrees with the above.    _______________________________________________________________________ For Office Staff:  Number of people involved in session: 1 Was an Intern/ student involved with case: no

## 2020-08-12 ENCOUNTER — Telehealth (INDEPENDENT_AMBULATORY_CARE_PROVIDER_SITE_OTHER): Payer: Medicare Other | Admitting: Internal Medicine

## 2020-08-12 ENCOUNTER — Encounter: Payer: Self-pay | Admitting: Internal Medicine

## 2020-08-12 ENCOUNTER — Other Ambulatory Visit: Payer: Self-pay

## 2020-08-12 ENCOUNTER — Telehealth: Payer: Self-pay | Admitting: Hematology

## 2020-08-12 VITALS — Ht 61.0 in | Wt 176.0 lb

## 2020-08-12 DIAGNOSIS — R059 Cough, unspecified: Secondary | ICD-10-CM

## 2020-08-12 DIAGNOSIS — Z170 Estrogen receptor positive status [ER+]: Secondary | ICD-10-CM

## 2020-08-12 DIAGNOSIS — R9389 Abnormal findings on diagnostic imaging of other specified body structures: Secondary | ICD-10-CM | POA: Diagnosis not present

## 2020-08-12 DIAGNOSIS — R918 Other nonspecific abnormal finding of lung field: Secondary | ICD-10-CM

## 2020-08-12 NOTE — Telephone Encounter (Signed)
No 1/19 los. No changes made to pt's schedule.  

## 2020-08-12 NOTE — Progress Notes (Signed)
Subjective:    Patient ID: [name], female    DOB: July 03, 1946, 75 y.o.   MRN: [id number]  DOS:  08/12/2020 Type of visit - description: Virtual Visit via Telephone    I connected with above mentioned patient  by telephone and verified that I am speaking with the correct person using two identifiers.  THIS ENCOUNTER IS A VIRTUAL VISIT DUE TO COVID-19 - PATIENT WAS NOT SEEN IN THE OFFICE. PATIENT HAS CONSENTED TO VIRTUAL VISIT / TELEMEDICINE VISIT   Location of patient: home  Location of provider: office  Persons participating in the virtual visit: patient, provider   I discussed the limitations, risks, security and privacy concerns of performing an evaluation and management service by telephone and the availability of in person appointments. I also discussed with the patient that there may be a patient responsible charge related to this service. The patient expressed understanding and agreed to proceed.  Acute  The patient was recently diagnosed with breast cancer and surgery is planned soon. She is concerned because her abnormal chest x-ray. Today, she also reports she has cough  for several weeks, mostly whitish sputum occasionally yellow tint mucus.  No fever chills      Review of Systems See above   Past Medical History:  Diagnosis Date  . Anxiety and depression   . Arthritis   . Blood transfusion without reported diagnosis 1989   GB hemorrage   . Cataract    Bil/no surgery yet.  . Coccyx pain    Secondary to scar tissue from old pressure ulcer after back surgery, Dr. Brantley Stage  . Coronary artery disease   . Depression 2010   panic attacks and depression  . Dizziness    severe: MRI chronic isch. changes and mastoiditis- saw Dr.Mundy(2007)  STATES SHE STILL HAS EPISODES- ESPECIALLY IF SHE GETS UP TOO QUICKLY AFTER LYING DOWN  . Eczema   . Family history of breast cancer   . Family history of kidney cancer   . Family history of lung cancer   . Family  history of melanoma   . Family history of stomach cancer   . Family history of throat cancer   . GERD (gastroesophageal reflux disease)   . Goiter   . Headache(784.0)   . Hyperlipidemia   . Hypertension   . Pain    PAIN LOWER BACK AND DOWN RIGHT LEG WITH NUMBNESS, TINGLING RT LEG AND FOOT--STENOSIS  . Partial tear of right rotator cuff    & labral tear  . S/P cardiac cath 2009   Revealing nonobstructive CAD w/ tubular, discrete 40% mid lesion in the circumflex; discrete 30% proximal lesion in the LAD; luminal irregularities, 35% mid lesion in RCA; and generic 40% mid lesion in the RCA. Medical treatment recommended.  . S/P cardiac cath 11/24/2014   Revealing nonobstructive CAD w/ tubular, discrete 40% mid lesion in the circumflex; discrete 30% proximal lesion in the LAD; luminal irregularities, 35% mid lesion in RCA; and generic 40% mid lesion in the RCA. Medical treatment recommended.  . Stroke [facility]) 2004   mild TIA    Past Surgical History:  Procedure Laterality Date  . ABDOMINAL HYSTERECTOMY     oophorectomy L only  . BREAST CYST ASPIRATION Left   . BREAST CYST EXCISION Left   . BUNIONECTOMY  1990s   LEFT  . CHOLECYSTECTOMY    . HAND SURGERY  1990s   R hand x 2, L hand x 1  . LUMBAR LAMINECTOMY/DECOMPRESSION MICRODISCECTOMY  N/A 11/13/2012   Procedure: MICRO LUMBAR DECOMPRESSION L4 - L5 AND L2 - L3 2 LEVELS;  Surgeon: Johnn Hai, MD;  Location: [facility];  Service: Orthopedics;  Laterality: N/A;  . SHOULDER SURGERY  2006   left     Allergies as of 08/12/2020      Reactions   Codeine Other (See Comments)     chest and stomach pain, severe abd cramping   Hydrocodone    Whelts all over/swelling in lips   Sertraline Hcl Other (See Comments)    muscle aches, diarrhea, nausea      Medication List       Accurate as of August 12, 2020 11:59 PM. If you have any questions, ask your nurse or doctor.        ALPRAZolam 0.25 MG tablet Commonly known as: XANAX Take 1-2  tablets (0.25-0.5 mg total) by mouth at bedtime as needed for anxiety or sleep.   amLODipine 10 MG tablet Commonly known as: NORVASC Take 1 tablet (10 mg total) by mouth daily.   pantoprazole 20 MG tablet Commonly known as: Protonix Take 1 tablet (20 mg total) by mouth daily before breakfast.   simvastatin 20 MG tablet Commonly known as: ZOCOR Take 1 tablet (20 mg total) by mouth at bedtime.          Objective:   Physical Exam Ht 5\' 1"  (1.549 m)   Wt 176 lb (79.8 kg)   BMI 33.25 kg/m  This is a virtual visit via telephone.  The patient sounded well.    Assessment     Assessment  Prediabetes HTN Hyperlipidemia Anxiety depression, insomnia- xanax rx by pcp GERD CV: TIA? (2004) CAD: catheterization 2009 and 2016: Non-obstructive CAD, 40% blockages, see report. rx medical treatment Goiter: Last ultrasound 12-2014, nodules decrease in size MSK:  --Back - coccyxt pain, lumbar surgery 2014, chronic residual pain --On prn celebrex --on Tramadol (rx by back surgeon)   Vit d def Chronic Dizziness Eczema/psoriasis , sees derm Breast cancer 07/26/2020.  PLAN: Abnormal chest x-ray, recent diagnosis of breast cancer. Patient presented 06/10/2020 with neck pain,a C-spine x-ray showed a question of an infiltrate, a follow-up chest x-ray confirmed a RUL opacity, possible pneumonia. She was treated with doxycycline. Repeated chest x-ray 07/21/2020 continue showing RUL reticular opacity unchanged from previous. In the meantime she was diagnosed with breast cancer 07/26/2020 Today she reports that she has a persisting cough for several weeks. I discussed the case with radiology, will change from high-resolution chest CT to CT chest with contrast. Patient is aware.   I discussed the assessment and treatment plan with the patient. The patient was provided an opportunity to ask questions and all were answered. The patient agreed with the plan and demonstrated an understanding of the  instructions.   The patient was advised to call back or seek an in-person evaluation if the symptoms worsen or if the condition fails to improve as anticipated.  I provided 22 minutes of non-face-to-face time during this encounter.  Kathlene November, MD

## 2020-08-12 NOTE — Addendum Note (Signed)
Addended byDamita Dunnings D on: 08/12/2020 11:27 AM   Modules accepted: Orders

## 2020-08-12 NOTE — Progress Notes (Signed)
Pre visit review using our clinic review tool, if applicable. No additional management support is needed unless otherwise documented below in the visit note. 

## 2020-08-13 NOTE — Assessment & Plan Note (Signed)
Abnormal chest x-ray, recent diagnosis of breast cancer. Patient presented 06/10/2020 with neck pain,a C-spine x-ray showed a question of an infiltrate, a follow-up chest x-ray confirmed a RUL opacity, possible pneumonia. She was treated with doxycycline. Repeated chest x-ray 07/21/2020 continue showing RUL reticular opacity unchanged from previous. In the meantime she was diagnosed with breast cancer 07/26/2020 Today she reports that she has a persisting cough for several weeks. I discussed the case with radiology, will change from high-resolution chest CT to CT chest with contrast. Patient is aware.

## 2020-08-15 ENCOUNTER — Ambulatory Visit (HOSPITAL_BASED_OUTPATIENT_CLINIC_OR_DEPARTMENT_OTHER): Payer: Medicare Other

## 2020-08-17 ENCOUNTER — Telehealth: Payer: Self-pay | Admitting: Internal Medicine

## 2020-08-17 ENCOUNTER — Telehealth: Payer: Self-pay

## 2020-08-17 NOTE — Telephone Encounter (Signed)
fyi Patient would like to let you know she has been sick all last week. She is going to get covid test this  Saturday. I offer the patient a virtual appt, pt declined, stating I just want to make him aware of it.

## 2020-08-17 NOTE — Telephone Encounter (Signed)
Okay, thank you!

## 2020-08-17 NOTE — Telephone Encounter (Signed)
Nutrition  Patient identified on Malnutrition Screening report for weight loss and attended Breast Clinic on 08/10/20.    75 year old female with new left breast cancer.  Planning lumpectomy on 2/2 and oncotype.  Past medical history of HTN, HLD, GERD, anxiety, smoker.  Planning radiation following surgery and antiestrogens.    Called and introduced self at [facility].  Patient reports that her appetite is better and she thinks she had COVID. Has not been tested, planning testing on 1/29. Reports that she is eating soups and having hunger pains.    Ht: 61 inches Wt: 176 lb on 1/21 UBW of 183-184 lb 12/30 4% weight loss in the last 3 weeks.   Intervention Discussed importance of nutrition. Encouraged small frequent meals Encouraged good sources of protein Patient says that she has RD's contact information and will contact if needed  No follow-up but available if needed  [name] B. Zenia Resides, [place], [place] Registered Dietitian [phone] (mobile)

## 2020-08-17 NOTE — Telephone Encounter (Signed)
Just an FYI

## 2020-08-18 ENCOUNTER — Encounter: Payer: Self-pay | Admitting: *Deleted

## 2020-08-18 ENCOUNTER — Other Ambulatory Visit: Payer: Self-pay

## 2020-08-18 ENCOUNTER — Encounter (HOSPITAL_BASED_OUTPATIENT_CLINIC_OR_DEPARTMENT_OTHER): Payer: Self-pay | Admitting: General Surgery

## 2020-08-18 ENCOUNTER — Telehealth: Payer: Self-pay | Admitting: *Deleted

## 2020-08-18 NOTE — Telephone Encounter (Signed)
Spoke with patient to follow up from [facility] 11/9 and assess navigation needs. Patient denies any questions or concerns at this time. Encouraged her to call should anything arise. Patient verbalized understanding.  

## 2020-08-20 ENCOUNTER — Other Ambulatory Visit (HOSPITAL_COMMUNITY)
Admission: RE | Admit: 2020-08-20 | Discharge: 2020-08-20 | Disposition: A | Discharge status: Home / Self Care | Payer: Medicare Other | Source: Ambulatory Visit | Attending: General Surgery | Admitting: General Surgery

## 2020-08-20 DIAGNOSIS — U071 COVID-19: Secondary | ICD-10-CM | POA: Diagnosis not present

## 2020-08-20 DIAGNOSIS — Z01812 Encounter for preprocedural laboratory examination: Secondary | ICD-10-CM | POA: Diagnosis not present

## 2020-08-20 LAB — SARS CORONAVIRUS 2 (TAT 6-24 HRS): SARS Coronavirus 2: POSITIVE — AB

## 2020-08-21 NOTE — Progress Notes (Signed)
Pt tested Positive for Covid. Called Surgeon's office, answering service will page on-call MD to notify pt of test results.

## 2020-08-22 NOTE — Progress Notes (Signed)
Called Dr Marlowe Aschoff office to inform of positive covid test. Pt is scheduled for seed placement tomorrow. Left message on VM of Samantha Crimes, breast coordinator. Informed that patient would need to be rescheduled or moved to the main OR if requires urgent surgery. Informed case will be dropped to depot.

## 2020-08-23 ENCOUNTER — Encounter: Payer: Self-pay | Admitting: *Deleted

## 2020-08-30 ENCOUNTER — Telehealth: Payer: Medicare Other

## 2020-08-31 ENCOUNTER — Other Ambulatory Visit: Payer: Self-pay

## 2020-08-31 ENCOUNTER — Encounter (HOSPITAL_BASED_OUTPATIENT_CLINIC_OR_DEPARTMENT_OTHER): Payer: Self-pay | Admitting: General Surgery

## 2020-09-05 ENCOUNTER — Encounter (HOSPITAL_BASED_OUTPATIENT_CLINIC_OR_DEPARTMENT_OTHER)
Admission: RE | Admit: 2020-09-05 | Discharge: 2020-09-05 | Disposition: A | Discharge status: Home / Self Care | Payer: Medicare Other | Source: Ambulatory Visit | Attending: General Surgery | Admitting: General Surgery

## 2020-09-05 ENCOUNTER — Other Ambulatory Visit: Payer: Self-pay

## 2020-09-05 DIAGNOSIS — Z01812 Encounter for preprocedural laboratory examination: Secondary | ICD-10-CM | POA: Insufficient documentation

## 2020-09-05 NOTE — Progress Notes (Signed)
Surgical soap given to patient, instructions given, patient verbalized understanding.

## 2020-09-07 ENCOUNTER — Other Ambulatory Visit: Payer: Self-pay

## 2020-09-07 ENCOUNTER — Ambulatory Visit
Admission: RE | Admit: 2020-09-07 | Discharge: 2020-09-07 | Disposition: A | Discharge status: Home / Self Care | Payer: Medicare Other | Source: Ambulatory Visit | Attending: General Surgery | Admitting: General Surgery

## 2020-09-07 DIAGNOSIS — C50412 Malignant neoplasm of upper-outer quadrant of left female breast: Secondary | ICD-10-CM | POA: Diagnosis not present

## 2020-09-07 DIAGNOSIS — Z170 Estrogen receptor positive status [ER+]: Secondary | ICD-10-CM

## 2020-09-07 NOTE — H&P (Signed)
[name] Appointment: 08/10/2020 1:00 PM Location: [facility] Patient #: [id number] DOB: 11-Dec-1945 Married / Language: Cleophus Molt / Race: White Female   History of Present Illness Jenny Klein MD; 08/10/2020 2:50 PM) The patient is a 75 year old female who presents with breast cancer.Pt is a lovely 75 yo F with a new diagnosis of left breast cancer 07/2020. She presented with a screening detected left breast mass. Diagnostic imaging showed an 8 mm mass at 1 o'clock. 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   She has not been diagnosed with cancer before this breast cancer diagnosis. She has two children who have had melanoma. She had two cousins with breast cancer. She also has family history of throat cancer and lung cancer. She had menarche at age 56. She is a G4P4 with first child in her teens. She had a partial hysterectomy in her early 15s.   dx mammo/us 07/04/2020 ACR Breast Density Category b: There are scattered areas of fibroglandular density.  FINDINGS: There is a persistent irregular mass within the upper outer left breast anterior depth. No additional concerning masses, calcifications or distortion identified within either breast.  Mammographic images were processed with CAD.  Targeted ultrasound is performed, showing a 4 x 8 x 5 mm irregular hypoechoic mass left breast 1 o'clock position 4 cm from the nipple. No left axillary adenopathy.  IMPRESSION: Indeterminate left breast mass 1 o'clock position.  RECOMMENDATION: Ultrasound-guided core needle biopsy indeterminate breast mass.  I have discussed the findings and recommendations with the patient. If applicable, a reminder letter will be sent to the patient regarding the next appointment.  BI-RADS CATEGORY 4: Suspicious.  pathology 07/26/2020 Breast, left, needle core biopsy, upper outer 1 o'clock - INVASIVE DUCTAL  CARCINOMA Based on the biopsy, the carcinoma appears Nottingham grade 2 of 3 and measures 0.7 cm in greatest linear extent. The tumor cells are NEGATIVE for Her2 (1+). Estrogen Receptor: 90%, POSITIVE, STRONG STAINING INTENSITY Progesterone Receptor: 80%, POSITIVE, STRONG STAINING INTENSITY Proliferation Marker Ki67: 2%  Labs 08/10/2020 CBC wtih HCT 47.6 and plts 121k CMET wtih K 3.4, AST 46   Past Surgical History Conni Slipper, RN; 08/10/2020 8:08 AM) Gallbladder Surgery - Open  Hysterectomy (not due to cancer) - Complete  Tonsillectomy   Diagnostic Studies History Conni Slipper, RN; 08/10/2020 8:08 AM) Colonoscopy  1-5 years ago Mammogram  within last year Pap Smear  >5 years ago 1-5 years ago  Medication History Conni Slipper, RN; 08/10/2020 8:08 AM) Medications Reconciled  Social History Conni Slipper, RN; 08/10/2020 8:08 AM) Caffeine use  Carbonated beverages. No alcohol use  No drug use  Tobacco use  Former smoker.  Family History Conni Slipper, RN; 08/10/2020 8:08 AM) Alcohol Abuse  Brother. Arthritis  Mother. Cancer  Brother, Family Members In [place], Mother. Cerebrovascular Accident  Family Members In [place], Mother, Son. Hypertension  Brother, Son, Mother. Kidney Disease  Family Members In [place]. Melanoma  Daughter, Son. Respiratory Condition  Mother.  Pregnancy / Birth History Conni Slipper, RN; 08/10/2020 8:08 AM) Age at menarche  49 years. Age of menopause  <43 Contraceptive History  Oral contraceptives. Gravida  4 Irregular periods  Maternal age  8-20 Para  4  Other Problems Conni Slipper, RN; 08/10/2020 8:08 AM) Anxiety Disorder  Arthritis  Back Pain  Bladder Problems  Cerebrovascular Accident  Cholelithiasis  Depression  Hemorrhoids  High blood pressure  Hypercholesterolemia  Migraine  Headache  Thyroid Disease  Transfusion history     Review of Systems Conni Slipper RN; 08/10/2020  8:08 AM) General Present- Appetite Loss, Chills, Fatigue and Weight Loss. Not Present- Fever, Night Sweats and Weight Gain. Skin Present- Dryness. Not Present- Change in Wart/Mole, Hives, Jaundice, New Lesions, Non-Healing Wounds, Rash and Ulcer. HEENT Present- Wears glasses/contact lenses. Not Present- Earache, Hearing Loss, Hoarseness, Nose Bleed, Oral Ulcers, Ringing in the Ears, Seasonal Allergies, Sinus Pain, Sore Throat, Visual Disturbances and Yellow Eyes. Respiratory Present- Chronic Cough and Wheezing. Not Present- Bloody sputum, Difficulty Breathing and Snoring. Breast Present- Breast Pain. Not Present- Breast Mass, Nipple Discharge and Skin Changes. Cardiovascular Present- Swelling of Extremities. Not Present- Chest Pain, Difficulty Breathing Lying Down, Leg Cramps, Palpitations, Rapid Heart Rate and Shortness of Breath. Gastrointestinal Present- Change in Bowel Habits, Chronic diarrhea and Nausea. Not Present- Abdominal Pain, Bloating, Bloody Stool, Constipation, Difficulty Swallowing, Excessive gas, Gets full quickly at meals, Hemorrhoids, Indigestion, Rectal Pain and Vomiting. Female Genitourinary Present- Nocturia. Not Present- Frequency, Painful Urination, Pelvic Pain and Urgency. Musculoskeletal Present- Back Pain, Joint Stiffness and Swelling of Extremities. Not Present- Joint Pain, Muscle Pain and Muscle Weakness. Neurological Present- Headaches. Not Present- Decreased Memory, Fainting, Numbness, Seizures, Tingling, Tremor, Trouble walking and Weakness. Psychiatric Present- Anxiety and Fearful. Not Present- Bipolar, Change in Sleep Pattern, Depression and Frequent crying. Endocrine Present- Cold Intolerance. Not Present- Excessive Hunger, Hair Changes, Heat Intolerance, Hot flashes and New Diabetes. Hematology Present- Blood Thinners. Not Present- Easy Bruising, Excessive bleeding, Gland problems, HIV and Persistent Infections.  Vitals Jenny Klein MD; 08/10/2020 1:20 PM) 08/10/2020  1:19 PM Weight: 176.6 lb Height: 61in Body Surface Area: 1.79 m Body Mass Index: 33.37 kg/m  Temp.: 97.90F  Pulse: 91 (Regular)  Resp.: 19 (Unlabored)  BP: 119/96(Sitting, Left Arm, Standard)       Physical Exam Jenny Klein MD; 08/10/2020 2:50 PM) General Mental Status-Alert. General Appearance-Consistent with stated age. Hydration-Well hydrated. Voice-Normal.  Head and Neck Head-normocephalic, atraumatic with no lesions or palpable masses. Trachea-midline. Thyroid Gland Characteristics - normal size and consistency.  Eye Eyeball - Bilateral-Extraocular movements intact. Sclera/Conjunctiva - Bilateral-No scleral icterus.  Chest and Lung Exam Chest and lung exam reveals -quiet, even and easy respiratory effort with no use of accessory muscles and on auscultation, normal breath sounds, no adventitious sounds and normal vocal resonance. Inspection Chest Wall - Normal. Back - normal.  Breast Note: breasts are relatively symmetric. moderate ptosis. no palpable masses. no skin dimpling or nipple discharge. no nipple retraction. no LAD. faint bruising at biopsy site.   Cardiovascular Cardiovascular examination reveals -normal heart sounds, regular rate and rhythm with no murmurs and normal pedal pulses bilaterally. Note: + 1 pitting edema.   Abdomen Inspection Inspection of the abdomen reveals - No Hernias. Palpation/Percussion Palpation and Percussion of the abdomen reveal - Soft, Non Tender, No Rebound tenderness, No Rigidity (guarding) and No hepatosplenomegaly. Auscultation Auscultation of the abdomen reveals - Bowel sounds normal.  Neurologic Neurologic evaluation reveals -alert and oriented x 3 with no impairment of recent or remote memory. Mental Status-Normal.  Musculoskeletal Global Assessment -Note: no gross deformities.  Normal Exam - Left-Upper Extremity Strength Normal and Lower Extremity Strength  Normal. Normal Exam - Right-Upper Extremity Strength Normal and Lower Extremity Strength Normal.  Lymphatic Head & Neck  General Head & Neck Lymphatics: Bilateral - Description - Normal. Axillary  General Axillary Region: Bilateral - Description - Normal. Tenderness - Non Tender. Femoral & Inguinal  Generalized Femoral & Inguinal Lymphatics:  Bilateral - Description - No Generalized lymphadenopathy.    Assessment & Plan Jenny Klein MD; 08/10/2020 2:51 PM) MALIGNANT NEOPLASM OF UPPER-OUTER QUADRANT OF LEFT BREAST IN FEMALE, ESTROGEN RECEPTOR POSITIVE (C50.412) Impression: Pt has a new diagnosis of cT1bN0 left breast cancer. She is a good candidate for a seed localized lumpectomy, no sentinel node required due to age and good prognostic factors I discussed pros and cons of lumpectomy vs mastectomy.  We will plan seed localized lumpectomy.  The surgical procedure was described to the patient. I discussed the incision type and location and that we would need radiology involved on with a wire or seed marker and/or sentinel node.  The risks and benefits of the procedure were described to the patient and she wishes to proceed.  We discussed the risks bleeding, infection, damage to other structures, need for further procedures/surgeries. We discussed the risk of seroma. The patient was advised if the area in the breast in cancer, we may need to go back to surgery for additional tissue to obtain negative margins or for a lymph node biopsy. The patient was advised that these are the most common complications, but that others can occur as well. They were advised against taking aspirin or other anti-inflammatory agents/blood thinners the week before surgery. Current Plans You are being scheduled for surgery- Our schedulers will call you.  You should hear from our office's scheduling department within 5 working days about the location, date, and time of surgery. We try to make accommodations  for patient's preferences in scheduling surgery, but sometimes the OR schedule or the surgeon's schedule prevents [place] from making those accommodations.  If you have not heard from our office [phone]) in 5 working days, call the office and ask for your surgeon's nurse.  If you have other questions about your diagnosis, plan, or surgery, call the office and ask for your surgeon's nurse.  Pt Education - [facility] breast cancer surgery: discussed with patient and provided information.

## 2020-09-08 ENCOUNTER — Ambulatory Visit (HOSPITAL_BASED_OUTPATIENT_CLINIC_OR_DEPARTMENT_OTHER): Payer: Medicare Other | Admitting: Anesthesiology

## 2020-09-08 ENCOUNTER — Encounter (HOSPITAL_BASED_OUTPATIENT_CLINIC_OR_DEPARTMENT_OTHER): Payer: Self-pay | Admitting: General Surgery

## 2020-09-08 ENCOUNTER — Encounter (HOSPITAL_BASED_OUTPATIENT_CLINIC_OR_DEPARTMENT_OTHER)
Admission: RE | Disposition: A | Discharge status: Home / Self Care | Payer: Self-pay | Source: Home / Self Care | Attending: General Surgery

## 2020-09-08 ENCOUNTER — Ambulatory Visit
Admission: RE | Admit: 2020-09-08 | Discharge: 2020-09-08 | Disposition: A | Discharge status: Home / Self Care | Payer: Medicare Other | Source: Ambulatory Visit | Attending: General Surgery | Admitting: General Surgery

## 2020-09-08 ENCOUNTER — Ambulatory Visit (HOSPITAL_BASED_OUTPATIENT_CLINIC_OR_DEPARTMENT_OTHER)
Admission: RE | Admit: 2020-09-08 | Discharge: 2020-09-08 | Disposition: A | Discharge status: Home / Self Care | Payer: Medicare Other | Attending: General Surgery | Admitting: General Surgery

## 2020-09-08 ENCOUNTER — Other Ambulatory Visit: Payer: Self-pay

## 2020-09-08 DIAGNOSIS — Z170 Estrogen receptor positive status [ER+]: Secondary | ICD-10-CM | POA: Insufficient documentation

## 2020-09-08 DIAGNOSIS — Z90710 Acquired absence of both cervix and uterus: Secondary | ICD-10-CM | POA: Diagnosis not present

## 2020-09-08 DIAGNOSIS — C50412 Malignant neoplasm of upper-outer quadrant of left female breast: Secondary | ICD-10-CM

## 2020-09-08 DIAGNOSIS — K219 Gastro-esophageal reflux disease without esophagitis: Secondary | ICD-10-CM | POA: Diagnosis not present

## 2020-09-08 DIAGNOSIS — Z808 Family history of malignant neoplasm of other organs or systems: Secondary | ICD-10-CM | POA: Diagnosis not present

## 2020-09-08 DIAGNOSIS — Z87891 Personal history of nicotine dependence: Secondary | ICD-10-CM | POA: Diagnosis not present

## 2020-09-08 DIAGNOSIS — Z809 Family history of malignant neoplasm, unspecified: Secondary | ICD-10-CM | POA: Insufficient documentation

## 2020-09-08 DIAGNOSIS — C50912 Malignant neoplasm of unspecified site of left female breast: Secondary | ICD-10-CM | POA: Diagnosis not present

## 2020-09-08 DIAGNOSIS — N6012 Diffuse cystic mastopathy of left breast: Secondary | ICD-10-CM | POA: Diagnosis not present

## 2020-09-08 DIAGNOSIS — I2510 Atherosclerotic heart disease of native coronary artery without angina pectoris: Secondary | ICD-10-CM | POA: Diagnosis not present

## 2020-09-08 DIAGNOSIS — I10 Essential (primary) hypertension: Secondary | ICD-10-CM | POA: Diagnosis not present

## 2020-09-08 HISTORY — PX: BREAST LUMPECTOMY WITH RADIOACTIVE SEED LOCALIZATION: SHX6424

## 2020-09-08 SURGERY — BREAST LUMPECTOMY WITH RADIOACTIVE SEED LOCALIZATION
Anesthesia: General | Site: Breast | Laterality: Left

## 2020-09-08 MED ORDER — ACETAMINOPHEN 500 MG PO TABS
ORAL_TABLET | ORAL | Status: AC
  Filled 2020-09-08: qty 2

## 2020-09-08 MED ORDER — FENTANYL CITRATE (PF) 100 MCG/2ML IJ SOLN
INTRAMUSCULAR | Status: DC | PRN
  Administered 2020-09-08 (×4): 25 ug via INTRAVENOUS

## 2020-09-08 MED ORDER — CHLORHEXIDINE GLUCONATE CLOTH 2 % EX PADS: 6.0000 | MEDICATED_PAD | Freq: Once | CUTANEOUS | Status: DC

## 2020-09-08 MED ORDER — FENTANYL CITRATE (PF) 100 MCG/2ML IJ SOLN: 25.0000 ug | INTRAMUSCULAR | Status: DC | PRN

## 2020-09-08 MED ORDER — TRAMADOL HCL 50 MG PO TABS: 50.0000 mg | ORAL_TABLET | Freq: Four times a day (QID) | ORAL | 1 refills | Status: DC | PRN

## 2020-09-08 MED ORDER — LIDOCAINE 2% (20 MG/ML) 5 ML SYRINGE
INTRAMUSCULAR | Status: DC | PRN
  Administered 2020-09-08: 60 mg via INTRAVENOUS

## 2020-09-08 MED ORDER — CEFAZOLIN SODIUM-DEXTROSE 2-4 GM/100ML-% IV SOLN
INTRAVENOUS | Status: AC
  Filled 2020-09-08: qty 100

## 2020-09-08 MED ORDER — ONDANSETRON HCL 4 MG/2ML IJ SOLN
INTRAMUSCULAR | Status: DC | PRN
  Administered 2020-09-08: 4 mg via INTRAVENOUS

## 2020-09-08 MED ORDER — CEFAZOLIN SODIUM-DEXTROSE 2-4 GM/100ML-% IV SOLN
2.0000 g | INTRAVENOUS | Status: AC
  Administered 2020-09-08: 2 g via INTRAVENOUS

## 2020-09-08 MED ORDER — DEXAMETHASONE SODIUM PHOSPHATE 10 MG/ML IJ SOLN
INTRAMUSCULAR | Status: DC | PRN
  Administered 2020-09-08: 10 mg via INTRAVENOUS

## 2020-09-08 MED ORDER — ONDANSETRON HCL 4 MG/2ML IJ SOLN: 4.0000 mg | Freq: Once | INTRAMUSCULAR | Status: DC | PRN

## 2020-09-08 MED ORDER — ONDANSETRON HCL 4 MG/2ML IJ SOLN
INTRAMUSCULAR | Status: AC
  Filled 2020-09-08: qty 2

## 2020-09-08 MED ORDER — AMISULPRIDE (ANTIEMETIC) 5 MG/2ML IV SOLN: 10.0000 mg | Freq: Once | INTRAVENOUS | Status: DC | PRN

## 2020-09-08 MED ORDER — FENTANYL CITRATE (PF) 100 MCG/2ML IJ SOLN
INTRAMUSCULAR | Status: AC
  Filled 2020-09-08: qty 2

## 2020-09-08 MED ORDER — LIDOCAINE 2% (20 MG/ML) 5 ML SYRINGE
INTRAMUSCULAR | Status: AC
  Filled 2020-09-08: qty 5

## 2020-09-08 MED ORDER — LIDOCAINE-EPINEPHRINE (PF) 1 %-1:200000 IJ SOLN
INTRAMUSCULAR | Status: DC | PRN
  Administered 2020-09-08: 30 mL via INTRAMUSCULAR

## 2020-09-08 MED ORDER — ACETAMINOPHEN 500 MG PO TABS
1000.0000 mg | ORAL_TABLET | ORAL | Status: AC
  Administered 2020-09-08: 1000 mg via ORAL

## 2020-09-08 MED ORDER — DEXAMETHASONE SODIUM PHOSPHATE 10 MG/ML IJ SOLN
INTRAMUSCULAR | Status: AC
  Filled 2020-09-08: qty 1

## 2020-09-08 MED ORDER — PROPOFOL 10 MG/ML IV BOLUS
INTRAVENOUS | Status: DC | PRN
  Administered 2020-09-08: 140 mg via INTRAVENOUS

## 2020-09-08 MED ORDER — LACTATED RINGERS IV SOLN: INTRAVENOUS | Status: DC

## 2020-09-08 SURGICAL SUPPLY — 40 items
ADH SKN CLS APL DERMABOND .7 (GAUZE/BANDAGES/DRESSINGS) ×1
APL PRP STRL LF DISP 70% ISPRP (MISCELLANEOUS) ×1
BINDER BREAST XLRG (GAUZE/BANDAGES/DRESSINGS) ×2 IMPLANT
BINDER BREAST XXLRG (GAUZE/BANDAGES/DRESSINGS) IMPLANT
BLADE SURG 10 STRL SS (BLADE) ×2 IMPLANT
CHLORAPREP W/TINT 26 (MISCELLANEOUS) ×2 IMPLANT
CLIP VESOCCLUDE LG 6/CT (CLIP) ×2 IMPLANT
COVER BACK TABLE 60X90IN (DRAPES) ×2 IMPLANT
COVER MAYO STAND STRL (DRAPES) ×2 IMPLANT
COVER PROBE W GEL 5X96 (DRAPES) ×2 IMPLANT
DERMABOND ADVANCED (GAUZE/BANDAGES/DRESSINGS) ×1
DERMABOND ADVANCED .7 DNX12 (GAUZE/BANDAGES/DRESSINGS) ×1 IMPLANT
DRAPE LAPAROSCOPIC ABDOMINAL (DRAPES) ×2 IMPLANT
DRAPE UTILITY XL STRL (DRAPES) ×2 IMPLANT
ELECT COATED BLADE 2.86 ST (ELECTRODE) ×2 IMPLANT
ELECT REM PT RETURN 9FT ADLT (ELECTROSURGICAL) ×2
ELECTRODE REM PT RTRN 9FT ADLT (ELECTROSURGICAL) ×1 IMPLANT
GAUZE SPONGE 4X4 12PLY STRL LF (GAUZE/BANDAGES/DRESSINGS) ×2 IMPLANT
GLOVE SURG ENC MOIS LTX SZ6 (GLOVE) ×2 IMPLANT
GLOVE SURG ENC MOIS LTX SZ6.5 (GLOVE) ×2 IMPLANT
GLOVE SURG UNDER POLY LF SZ6.5 (GLOVE) ×2 IMPLANT
GOWN STRL REUS W/ TWL LRG LVL3 (GOWN DISPOSABLE) ×1 IMPLANT
GOWN STRL REUS W/TWL 2XL LVL3 (GOWN DISPOSABLE) ×2 IMPLANT
GOWN STRL REUS W/TWL LRG LVL3 (GOWN DISPOSABLE) ×2
KIT MARKER MARGIN INK (KITS) ×2 IMPLANT
NEEDLE HYPO 25X1 1.5 SAFETY (NEEDLE) ×2 IMPLANT
NS IRRIG 1000ML POUR BTL (IV SOLUTION) ×2 IMPLANT
PACK BASIN DAY SURGERY FS (CUSTOM PROCEDURE TRAY) ×2 IMPLANT
PENCIL SMOKE EVACUATOR (MISCELLANEOUS) ×2 IMPLANT
SLEEVE SCD COMPRESS KNEE MED (MISCELLANEOUS) ×2 IMPLANT
SPONGE LAP 18X18 RF (DISPOSABLE) ×2 IMPLANT
STRIP CLOSURE SKIN 1/2X4 (GAUZE/BANDAGES/DRESSINGS) ×2 IMPLANT
SUT MNCRL AB 4-0 PS2 18 (SUTURE) ×2 IMPLANT
SUT VIC AB 2-0 SH 27 (SUTURE) ×2
SUT VIC AB 2-0 SH 27XBRD (SUTURE) ×1 IMPLANT
SUT VIC AB 3-0 SH 27 (SUTURE) ×2
SUT VIC AB 3-0 SH 27X BRD (SUTURE) ×1 IMPLANT
SYR CONTROL 10ML LL (SYRINGE) ×2 IMPLANT
TOWEL GREEN STERILE FF (TOWEL DISPOSABLE) ×2 IMPLANT
TRAY FAXITRON CT DISP (TRAY / TRAY PROCEDURE) ×2 IMPLANT

## 2020-09-08 NOTE — Anesthesia Procedure Notes (Signed)
Procedure Name: LMA Insertion Date/Time: 09/08/2020 1:43 PM Performed by: Genelle Bal, CRNA Pre-anesthesia Checklist: Patient identified, Emergency Drugs available, Suction available and Patient being monitored Patient Re-evaluated:Patient Re-evaluated prior to induction Oxygen Delivery Method: Circle system utilized Preoxygenation: Pre-oxygenation with 100% oxygen Induction Type: IV induction Ventilation: Mask ventilation without difficulty LMA: LMA inserted LMA Size: 4.0 Number of attempts: 1 Airway Equipment and Method: Bite block Placement Confirmation: positive ETCO2 Tube secured with: Tape Dental Injury: Teeth and Oropharynx as per pre-operative assessment

## 2020-09-08 NOTE — Interval H&P Note (Signed)
History and Physical Interval Note:  09/08/2020 12:21 PM  [name]  has presented today for surgery, with the diagnosis of LEFT BREAST CANCER.  The various methods of treatment have been discussed with the patient and family. After consideration of risks, benefits and other options for treatment, the patient has consented to  Procedure(s): LEFT BREAST LUMPECTOMY WITH RADIOACTIVE SEED LOCALIZATION (Left) as a surgical intervention.  [name] history has been reviewed, patient examined, no change in status, stable for surgery.  I have reviewed the patient's chart and labs.  Questions were answered to the patient's satisfaction.     Stark Klein

## 2020-09-08 NOTE — Discharge Instructions (Addendum)
[facility] °Office Phone Number [phone] ° °BREAST BIOPSY/ PARTIAL MASTECTOMY: POST OP INSTRUCTIONS ° °Always review your discharge instruction sheet given to you by the facility where your surgery was performed. ° °IF YOU HAVE DISABILITY OR FAMILY LEAVE FORMS, YOU MUST BRING THEM TO THE OFFICE FOR PROCESSING.  DO NOT GIVE THEM TO YOUR DOCTOR. ° °1. A prescription for pain medication may be given to you upon discharge.  Take your pain medication as prescribed, if needed.  If narcotic pain medicine is not needed, then you may take acetaminophen (Tylenol) or ibuprofen (Advil) as needed. °2. Take your usually prescribed medications unless otherwise directed °3. If you need a refill on your pain medication, please contact your pharmacy.  They will contact our office to request authorization.  Prescriptions will not be filled after 5pm or on week-ends. °4. You should eat very light the first 24 hours after surgery, such as soup, crackers, pudding, etc.  Resume your normal diet the day after surgery. °5. Most patients will experience some swelling and bruising in the breast.  Ice packs and a good support bra will help.  Swelling and bruising can take several days to resolve.  °6. It is common to experience some constipation if taking pain medication after surgery.  Increasing fluid intake and taking a stool softener will usually help or prevent this problem from occurring.  A mild laxative (Milk of Magnesia or Miralax) should be taken according to package directions if there are no bowel movements after 48 hours. °7. Unless discharge instructions indicate otherwise, you may remove your bandages 48 hours after surgery, and you may shower at that time.  You may have steri-strips (small skin tapes) in place directly over the incision.  These strips should be left on the skin for 7-10 days.   Any sutures or staples will be removed at the office during your follow-up visit. °8. ACTIVITIES:  You may resume  regular daily activities (gradually increasing) beginning the next day.  Wearing a good support bra or sports bra (or the breast binder) minimizes pain and swelling.  You may have sexual intercourse when it is comfortable. °a. You may drive when you no longer are taking prescription pain medication, you can comfortably wear a seatbelt, and you can safely maneuver your car and apply brakes. °b. RETURN TO WORK:  __________1 week_______________ °9. You should see your doctor in the office for a follow-up appointment approximately two weeks after your surgery.  Your doctor’s nurse will typically make your follow-up appointment when she calls you with your pathology report.  Expect your pathology report 2-3 business days after your surgery.  You may call to check if you do not hear from us after three days. ° ° °WHEN TO CALL YOUR DOCTOR: °1. Fever over 101.0 °2. Nausea and/or vomiting. °3. Extreme swelling or bruising. °4. Continued bleeding from incision. °5. Increased pain, redness, or drainage from the incision. ° °The clinic staff is available to answer your questions during regular business hours.  Please don’t hesitate to call and ask to speak to one of the nurses for clinical concerns.  If you have a medical emergency, go to the nearest emergency room or call 911.  A surgeon from [facility] is always on call at the hospital. ° °For further questions, please visit [email or web address]  ° ° °Post Anesthesia Home Care Instructions ° °Activity: °Get plenty of rest for the remainder of the day. A responsible individual must stay with you for 24   hours following the procedure.  °For the next 24 hours, DO NOT: °-Drive a car °-Operate machinery °-Drink alcoholic beverages °-Take any medication unless instructed by your physician °-Make any legal decisions or sign important papers. ° °Meals: °Start with liquid foods such as gelatin or soup. Progress to regular foods as tolerated. Avoid greasy, spicy,  heavy foods. If nausea and/or vomiting occur, drink only clear liquids until the nausea and/or vomiting subsides. Call your physician if vomiting continues. ° °Special Instructions/Symptoms: °Your throat may feel dry or sore from the anesthesia or the breathing tube placed in your throat during surgery. If this causes discomfort, gargle with warm salt water. The discomfort should disappear within 24 hours. ° °If you had a scopolamine patch placed behind your ear for the management of post- operative nausea and/or vomiting: ° °1. The medication in the patch is effective for 72 hours, after which it should be removed.  Wrap patch in a tissue and discard in the trash. Wash hands thoroughly with soap and water. °2. You may remove the patch earlier than 72 hours if you experience unpleasant side effects which may include dry mouth, dizziness or visual disturbances. °3. Avoid touching the patch. Wash your hands with soap and water after contact with the patch. °  ° °

## 2020-09-08 NOTE — Transfer of Care (Signed)
Immediate Anesthesia Transfer of Care Note  Patient: [name]  Procedure(s) Performed: LEFT BREAST LUMPECTOMY WITH RADIOACTIVE SEED LOCALIZATION (Left Breast)  Patient Location: PACU  Anesthesia Type:General  Level of Consciousness: drowsy and patient cooperative  Airway & Oxygen Therapy: Patient Spontanous Breathing and Patient connected to face mask oxygen  Post-op Assessment: Report given to RN and Post -op Vital signs reviewed and stable  Post vital signs: Reviewed and stable  Last Vitals:  Vitals Value Taken Time  BP 114/72   Temp    Pulse 78   Resp 16   SpO2 99%     Last Pain:  Vitals:   09/08/20 1147  TempSrc: Oral  PainSc: 0-No pain         Complications: No complications documented.

## 2020-09-08 NOTE — Anesthesia Preprocedure Evaluation (Signed)
Anesthesia Evaluation  Patient identified by MRN, date of birth, ID band Patient awake    Reviewed: Allergy & Precautions, H&P , NPO status , Patient's Chart, lab work & pertinent test results  Airway Mallampati: II  TM Distance: >3 FB Neck ROM: Full    Dental   Pulmonary former smoker,  COVID + 08/20/20   Pulmonary exam normal        Cardiovascular Exercise Tolerance: Good hypertension, + CAD (nonobstructive)   Rhythm:Irregular     Neuro/Psych  Headaches, PSYCHIATRIC DISORDERS Anxiety Depression TIA   GI/Hepatic Neg liver ROS, GERD  ,  Endo/Other  negative endocrine ROS  Renal/GU negative Renal ROS  negative genitourinary   Musculoskeletal  (+) Arthritis ,   Abdominal   Peds negative pediatric ROS (+)  Hematology negative hematology ROS (+)   Anesthesia Other Findings   Reproductive/Obstetrics negative OB ROS                             Anesthesia Physical Anesthesia Plan  ASA: II  Anesthesia Plan: General   Post-op Pain Management:    Induction: Intravenous  PONV Risk Score and Plan: 3 and Midazolam, Ondansetron, Dexamethasone and Treatment may vary due to age or medical condition  Airway Management Planned: LMA  Additional Equipment:   Intra-op Plan:   Post-operative Plan: Extubation in OR  Informed Consent: I have reviewed the patients History and Physical, chart, labs and discussed the procedure including the risks, benefits and alternatives for the proposed anesthesia with the patient or authorized representative who has indicated his/her understanding and acceptance.     Dental advisory given  Plan Discussed with: CRNA and Anesthesiologist  Anesthesia Plan Comments:         Anesthesia Quick Evaluation

## 2020-09-08 NOTE — Anesthesia Postprocedure Evaluation (Signed)
Anesthesia Post Note  Patient: DAZIAH HESLER  Procedure(s) Performed: LEFT BREAST LUMPECTOMY WITH RADIOACTIVE SEED LOCALIZATION (Left Breast)     Patient location during evaluation: PACU Anesthesia Type: General Level of consciousness: awake Pain management: pain level controlled Vital Signs Assessment: post-procedure vital signs reviewed and stable Respiratory status: spontaneous breathing and respiratory function stable Cardiovascular status: stable Postop Assessment: no apparent nausea or vomiting Anesthetic complications: no   No complications documented.  Last Vitals:  Vitals:   09/08/20 1457 09/08/20 1511  BP: (!) 117/53 (!) 127/58  Pulse: 83   Resp: 20 15  Temp:  36.5 C  SpO2: 97% 97%    Last Pain:  Vitals:   09/08/20 1511  TempSrc:   PainSc: 0-No pain                 Merlinda Frederick

## 2020-09-08 NOTE — Op Note (Signed)
left Breast Radioactive seed localized lumpectomy  Indications: This patient presents with history of left breast cancer, upper outer quadrant, cT1bN0, grade 2 invasive ductal carcinoma, receptors+/+/-  Pre-operative Diagnosis: left breast cancer  Post-operative Diagnosis: left breast cancer  Surgeon: Stark Klein   Anesthesia: General endotracheal anesthesia  ASA Class: 2  Procedure Details  The patient was seen in the Holding Room. The risks, benefits, complications, treatment options, and expected outcomes were discussed with the patient. The possibilities of bleeding, infection, the need for additional procedures, failure to diagnose a condition, and creating a complication requiring other procedures or operations were discussed with the patient. The patient concurred with the proposed plan, giving informed consent.  The site of surgery properly noted/marked. The patient was taken to Operating Room # 3, identified, and the procedure verified as left breast seed localized lumpectomy. Timeout was performed according to the surgical safety checklist.  The left breast and chest were prepped and draped in standard fashion. A superolateral circumareolar incision was made near the previously placed radioactive seed.  Dissection was carried down around the point of maximum signal intensity. The cautery was used to perform the dissection.   The specimen was inked with the margin marker paint kit.    Specimen radiography confirmed inclusion of the mammographic lesion, the clip, and the seed.  The background signal in the breast was zero.  Hemostasis was achieved with cautery.  The cavity was marked with clips on each border other than the anterior border.  The wound was irrigated and closed with 3-0 vicryl interrupted deep dermal sutures and 4-0 monocryl running subcuticular suture.      Sterile dressings were applied. At the end of the operation, all sponge, instrument, and needle counts were  correct.   Findings: Seed, clip in specimen.  Anterior margin is skin.   Estimated Blood Loss:  min         Specimens: left breast tissue with seed         Complications:  None; patient tolerated the procedure well.         Disposition: PACU - hemodynamically stable.         Condition: stable

## 2020-09-12 ENCOUNTER — Encounter (HOSPITAL_BASED_OUTPATIENT_CLINIC_OR_DEPARTMENT_OTHER): Payer: Self-pay | Admitting: General Surgery

## 2020-09-12 LAB — SURGICAL PATHOLOGY

## 2020-09-13 ENCOUNTER — Encounter: Payer: Self-pay | Admitting: *Deleted

## 2020-09-14 ENCOUNTER — Telehealth: Payer: Self-pay | Admitting: Hematology

## 2020-09-14 NOTE — Telephone Encounter (Signed)
Scheduled appt per 2/22 sch msg - pt is aware of apt date and time

## 2020-09-19 ENCOUNTER — Encounter: Payer: Self-pay | Admitting: *Deleted

## 2020-09-22 NOTE — Progress Notes (Signed)
Chronic Care Management Pharmacy Note  09/26/2020 Name:  [name] MRN:  [id number] DOB:  September 11, 1945  Subjective: [name] is an 75 y.o. year old female who is a primary patient of Paz, Alda Berthold, MD.  The CCM team was consulted for assistance with disease management and care coordination needs.    Engaged with patient by telephone for initial visit in response to provider referral for pharmacy case management and/or care coordination services.   Consent to Services:  The patient was given the following information about Chronic Care Management services today, agreed to services, and gave verbal consent: 1. CCM service includes personalized support from designated clinical staff supervised by the primary care provider, including individualized plan of care and coordination with other care providers 2. 24/7 contact phone numbers for assistance for urgent and routine care needs. 3. Service will only be billed when office clinical staff spend 20 minutes or more in a month to coordinate care. 4. Only one practitioner may furnish and bill the service in a calendar month. 5.The patient may stop CCM services at any time (effective at the end of the month) by phone call to the office staff. 6. The patient will be responsible for cost sharing (co-pay) of up to 20% of the service fee (after annual deductible is met). Patient agreed to services and consent obtained.  Patient Care Team: Colon Branch, MD as PCP - General [name], Nadara Mustard, MD as Consulting Physician (Gynecology) Ladene Artist, MD as Consulting Physician (Gastroenterology) Erroll Luna, MD as Consulting Physician (General Surgery) Pedro Earls, MD as Attending Physician (Orthopedic Surgery) Netta Cedars, MD as Consulting Physician (Orthopedic Surgery) Linda Hedges, DO as Consulting Physician (Obstetrics and Gynecology) Mauro Kaufmann, RN as Oncology Nurse Navigator Rockwell Germany, RN as Oncology Nurse  Navigator Stark Klein, MD as Consulting Physician (General Surgery) Truitt Merle, MD as Consulting Physician (Hematology) Eppie Gibson, MD as Attending Physician (Radiation Oncology) Edythe Clarity, [facility] (Pharmacist) Edythe Clarity, [facility] (Pharmacist)  Recent office visits: 08/12/20 [facility]) - chest X-ray still showing opacity.  Patient reports persistent cough, recommend CT with contrast.  07/27/20 [facility]) - patient diagnosed with breast cancer  06/10/20 (Paz) - neck pain, X-ray of spine ordered, treated with prednisone and warm compress.  Patient also reports that she has been off citalopram for a while  04/27/20 [facility]) - annual physical, no medication changes, TSH slightly above normal continued all medications plan to recheck  Recent consult visits: 08/10/20 (Oncology, [facility]) - 64m mass in L breast.  No lymph node involvement.  Plan for genetic testing.  Recommend tamoxifen to ensure cancer does not recur.  Hospital visits: 09/08/20 ([facility]) - L breast lumpectomy  Objective:  Lab Results  Component Value Date   CREATININE 0.73 08/10/2020   BUN 6 (L) 08/10/2020   GFR 87.12 10/27/2019   GFRNONAA >60 08/10/2020   GFRAA >90 11/21/2012   NA 142 08/10/2020   K 3.4 (L) 08/10/2020   CALCIUM 9.0 08/10/2020   CO2 24 08/10/2020    Lab Results  Component Value Date/Time   HGBA1C 5.8 10/27/2019 11:39 AM   HGBA1C 6.3 04/28/2019 01:52 PM   GFR 87.12 10/27/2019 11:39 AM   GFR 80.19 04/28/2019 01:52 PM   MICROALBUR <0.7 03/07/2016 12:02 PM    Last diabetic Eye exam: No results found for: HMDIABEYEEXA  Last diabetic Foot exam: No results found for: HMDIABFOOTEX   Lab Results  Component Value Date   CHOL 147 04/27/2020  HDL 54 04/27/2020   LDLCALC 74 04/27/2020   LDLDIRECT 83.5 08/01/2012   TRIG 107 04/27/2020   CHOLHDL 2.7 04/27/2020    Hepatic Function Latest Ref Rng & Units 08/10/2020 04/27/2020 04/28/2019  Total Protein 6.5 - 8.1 g/dL 7.3 7.0 7.0  Albumin 3.5 -  5.0 g/dL 3.7 - 4.4  AST 15 - 41 U/L 46(H) 26 35  ALT 0 - 44 U/L 31 20 31   Alk Phosphatase 38 - 126 U/L 80 - 94  Total Bilirubin 0.3 - 1.2 mg/dL 0.6 0.6 0.5  Bilirubin, Direct 0.0 - 0.3 mg/dL - - -    Lab Results  Component Value Date/Time   TSH 4.73 (H) 04/27/2020 10:56 AM   TSH 2.84 09/10/2016 10:43 AM   FREET4 0.6 01/22/2007 10:29 AM    CBC Latest Ref Rng & Units 08/10/2020 04/27/2020 04/28/2019  WBC 4.0 - 10.5 K/uL 4.6 8.8 8.1  Hemoglobin 12.0 - 15.0 g/dL 16.3(H) 15.5 15.2(H)  Hematocrit 36.0 - 46.0 % 47.6(H) 46.7(H) 45.6  Platelets 150 - 400 K/uL 121(L) 181 190.0    Lab Results  Component Value Date/Time   VD25OH 36 04/27/2020 10:56 AM   VD25OH 17 (L) 12/03/2013 10:46 AM    Clinical ASCVD: No  The 10-year ASCVD risk score Mikey Bussing DC Jr., et al., 2013) is: 22.6%   Values used to calculate the score:     Age: 37 years     Sex: Female     Is Non-Hispanic African American: No     Diabetic: No     Tobacco smoker: No     Systolic Blood Pressure: 938 mmHg     Is BP treated: Yes     HDL Cholesterol: 54 mg/dL     Total Cholesterol: 147 mg/dL    Depression screen [facility] 2/9 07/21/2020 04/27/2020 10/27/2019  Decreased Interest 0 1 1  Down, Depressed, Hopeless 0 1 1  PHQ - 2 Score 0 2 2  Altered sleeping - 1 1  Tired, decreased energy - 1 1  Change in appetite - 1 1  Feeling bad or failure about yourself  - 0 0  Trouble concentrating - 0 0  Moving slowly or fidgety/restless - 0 0  Suicidal thoughts - 0 0  PHQ-9 Score - 5 5  Difficult doing work/chores - Not difficult at all Somewhat difficult      Social History   Tobacco Use  Smoking Status Former Smoker  . Packs/day: 1.00  . Years: 20.00  . Pack years: 20.00  . Quit date: 2000  . Years since quitting: 22.1  Smokeless Tobacco Never Used  Tobacco Comment   quit aprox 2000 after 30 years, 1 to 1.5 ppd   BP Readings from Last 3 Encounters:  09/08/20 (!) 127/58  08/10/20 (!) 119/96  07/21/20 122/84   Pulse  Readings from Last 3 Encounters:  09/08/20 83  08/10/20 91  07/21/20 (!) 102   Wt Readings from Last 3 Encounters:  09/08/20 175 lb 0.7 oz (79.4 kg)  08/12/20 176 lb (79.8 kg)  08/10/20 176 lb 9.6 oz (80.1 kg)    Assessment/Interventions: Review of patient past medical history, allergies, medications, health status, including review of consultants reports, laboratory and other test data, was performed as part of comprehensive evaluation and provision of chronic care management services.   SDOH:  (Social Determinants of Health) assessments and interventions performed: Yes   Financial Resource Strain: Low Risk   . Difficulty of Paying Living Expenses: Not hard at all  CCM Care Plan  Allergies  Allergen Reactions  . Codeine Other (See Comments)      chest and stomach pain, severe abd cramping  . Hydrocodone     Whelts all over/swelling in lips  . Sertraline Hcl Other (See Comments)     muscle aches, diarrhea, nausea    Medications Reviewed Today    Reviewed by Edythe Clarity, [facility] (Pharmacist) on 09/26/20 at 1145  Med List Status: <None>  Medication Order Taking? Sig Documenting Provider Last Dose Status Informant  acetaminophen (TYLENOL) 500 MG tablet [id number] Yes Take 500 mg by mouth every 6 (six) hours as needed. [provider]  Active   ALPRAZolam Duanne Moron) 0.25 MG tablet [id number] Yes Take 1-2 tablets (0.25-0.5 mg total) by mouth at bedtime as needed for anxiety or sleep. Colon Branch, MD Taking Active   amLODipine (NORVASC) 10 MG tablet [id number] Yes Take 1 tablet (10 mg total) by mouth daily. Colon Branch, MD Taking Active   aspirin 81 MG chewable tablet [id number] Yes Chew by mouth daily. [provider] Taking Active   calcium-vitamin D 250-100 MG-UNIT tablet [id number] Yes Take 1 tablet by mouth 2 (two) times daily. [provider] Taking Active   simvastatin (ZOCOR) 20 MG tablet [id number] Yes Take 1 tablet (20 mg total) by mouth at bedtime.  Colon Branch, MD Taking Active   traMADol Veatrice Bourbon) 50 MG tablet [id number] Yes Take 1 tablet (50 mg total) by mouth every 6 (six) hours as needed for moderate pain or severe pain. Stark Klein, MD Taking Active           Patient Active Problem List   Diagnosis Date Noted  . Family history of breast cancer   . Family history of melanoma   . Family history of throat cancer   . Family history of lung cancer   . Family history of kidney cancer   . Family history of stomach cancer   . Malignant neoplasm of upper-outer quadrant of left breast in female, estrogen receptor positive ([place]) 08/03/2020  . Insomnia 05/24/2017  . PCP NOTES >>>> 06/02/2015  . GERD (gastroesophageal reflux disease) 11/24/2014  . S/P cardiac cath 11/24/2014  . Allergic reaction 11/28/2012  . Spinal stenosis of lumbar region 11/13/2012  . Hyperglycemia 02/16/2011  . Annual physical exam 12/20/2010  . Dyslipidemia 06/12/2010  . DJD , back pain, hand pain 06/12/2010  . Anxiety and depression 10/15/2007  . Essential hypertension 05/09/2007  . GOITER, MULTINODULAR 11/12/2006    Immunization History  Administered Date(s) Administered  . Fluad Quad(high Dose 65+) 04/28/2019, 04/27/2020  . Influenza Whole 05/09/2007, 07/18/2009, 06/12/2010  . Influenza, High Dose Seasonal PF 06/01/2015, 05/23/2017, 05/08/2018  . Influenza, Seasonal, Injecte, Preservative Fre 08/01/2012  . Influenza,inj,Quad PF,6+ Mos 04/07/2014  . Pneumococcal Conjugate-13 12/03/2013  . Pneumococcal Polysaccharide-23 07/18/2009  . Tdap 12/20/2010  . Zoster 12/26/2011    Conditions to be addressed/monitored:  HTN, GERD, Dyslipidemia, Anxiety/Depression, Breast Cancer  Care Plan : General Pharmacy (Adult)  Updates made by Edythe Clarity, RPH since 09/26/2020 12:00 AM    Problem: HTN, GERD, Dyslipidemia, Anxiety/Depression, Breast Cancer   Priority: High  Onset Date: 09/26/2020    Long-Range Goal: Patient-Specific Goal   Start Date:  09/26/2020  Expected End Date: 03/29/2021  This Visit's Progress: On track  Priority: High  Note:   Current Barriers:  . Current pharmacy has closed down so patiet needs her prescriptions changed to a new pharmacy.  Pharmacist Clinical Goal(s):  .  Over the next 90 days, patient will maintain control of blood pressure as evidenced by home monitoring  . adhere to prescribed medication regimen as evidenced by fill dates . contact provider office for questions/concerns as evidenced notation of same in electronic health record through collaboration with PharmD and provider.   Interventions: . 1:1 collaboration with Colon Branch, MD regarding development and update of comprehensive plan of care as evidenced by provider attestation and co-signature . Inter-disciplinary care team collaboration (see longitudinal plan of care) . Comprehensive medication review performed; medication list updated in electronic medical record  Hypertension (BP goal <140/90) -Controlled -Current treatment: . Amlodipine 35m daily -Medications previously tried: none noted  -Current home readings: 128/70s, patient recalled from memory -Current exercise habits: Patient walking when weather is warm, mentions sometimes get SOB or gets "wobbly".  Recently had COVID-19 and was diagnosed with breast cancer. -Denies hypotensive/hypertensive symptoms -Educated on BP goals and benefits of medications for prevention of heart attack, stroke and kidney damage; Importance of home blood pressure monitoring; -Counseled to monitor BP at home a few times per week, document, and provide log at future appointments -Recommended to continue current medication  Hyperlipidemia: (LDL goal < 100) -Controlled -Current treatment: . Simvastatin 292m-Medications previously tried: none noted  -Denies myalgias -Educated on Cholesterol goals;  Benefits of statin for ASCVD risk reduction; -Recommended to continue current  medication  Depression/Anxiety (Goal: Minimize symptoms) -Uncontrolled -Current treatment: . Alprazolam 0.2581m-2 tablets hs -Medications previously tried/failed: none noted -Patient mentions she has had increased anxiety over recent health diagnoses.  She feels much better after the surgery is over.   -She is very happy with her care from the cancer center and has access to chaplain and counselor through them to help her. -She [facility] for symptom control -Recommended to continue current medication  Breast Cancer -Controlled -Current treatment  . Tramadol 81m56mh prn . APAP 500mg33m -Medications previously tried: none noted -She has upcoming appt with cancer center this week and may be prescribed a new medication.  -She recently had a successful lumpectomy and they removed all cancerous cells and tissue.  Patient is very relieved by this. -She has not had to use tramadol in about a week but does take Tylenol 500mg 70mfor pain or discomfort. -Recommend continue current medications.  GERD (Goal: Minimize symptoms) -Controlled -Current treatment  . Famotidine 20mg b30mMedications previously tried: Pantoprazole -Patient reports no longer taking Protonix.  Now takes famotidine 20mg bi33mth no issues with reflux symptoms. -Recommended to continue current medication  Hypothyroidism? -Not ideally controlled -Current treatment  . None -Medications previously tried: none noted -Patient mentions ongoing fatigue. -last TSH in October was elevated and patient has not rechecked. -Encouraged patient to schedule an appointment soon to follow up on this. -Recommended new TSH.  Treat if it remains elevated.     Patient Goals/Self-Care Activities . Over the next 90 days, patient will:  - take medications as prescribed check blood pressure daily, document, and provide at future  appointments target a minimum of 150 minutes of moderate intensity exercise weekly Report back to PCP for updated labwork especially for TSH follow up.  Follow Up Plan: The care management team will reach out to the patient again over the next 90 days.       Medication Assistance: None required.  Patient affirms current coverage meets needs.  Patient's preferred  pharmacy is:  [id number] [address], [address] [place] [address] Phone: [phone] Fax: [phone]  Uses pill box? No - keeps them in cabinet Pt endorses 100% compliance  We discussed: Benefits of medication synchronization, packaging and delivery as well as enhanced pharmacist oversight with [employer]. Patient decided to: Continue current medication management strategy  Care Plan and Follow Up Patient Decision:  Patient agrees to Care Plan and Follow-up.  Plan: The care management team will reach out to the patient again over the next 90 days.  Beverly Milch, PharmD Clinical Pharmacist [place] [phone]

## 2020-09-26 ENCOUNTER — Ambulatory Visit (INDEPENDENT_AMBULATORY_CARE_PROVIDER_SITE_OTHER): Payer: Medicare Other | Admitting: Pharmacist

## 2020-09-26 ENCOUNTER — Telehealth: Payer: Self-pay | Admitting: Pharmacist

## 2020-09-26 ENCOUNTER — Telehealth: Payer: Self-pay

## 2020-09-26 DIAGNOSIS — I10 Essential (primary) hypertension: Secondary | ICD-10-CM

## 2020-09-26 DIAGNOSIS — F419 Anxiety disorder, unspecified: Secondary | ICD-10-CM

## 2020-09-26 DIAGNOSIS — K219 Gastro-esophageal reflux disease without esophagitis: Secondary | ICD-10-CM

## 2020-09-26 DIAGNOSIS — Z170 Estrogen receptor positive status [ER+]: Secondary | ICD-10-CM | POA: Diagnosis not present

## 2020-09-26 DIAGNOSIS — F32A Depression, unspecified: Secondary | ICD-10-CM

## 2020-09-26 DIAGNOSIS — E785 Hyperlipidemia, unspecified: Secondary | ICD-10-CM

## 2020-09-26 DIAGNOSIS — C50412 Malignant neoplasm of upper-outer quadrant of left female breast: Secondary | ICD-10-CM | POA: Diagnosis not present

## 2020-09-26 MED ORDER — ALPRAZOLAM 0.25 MG PO TABS: 0.2500 mg | ORAL_TABLET | Freq: Every evening | ORAL | 3 refills | Status: DC | PRN

## 2020-09-26 NOTE — Telephone Encounter (Signed)
-----  Message from Edythe Clarity, [facility] sent at 09/26/2020 12:03 PM EST ----- Meryle Ready with Mrs. Kotowski today for CCM.  She requested a new Rx for her alprazolam to be sent in to the [facility] on [address].  I have updated her preferred pharmacy as her old one closed down.  Thanks  Beverly Milch, PharmD Clinical Pharmacist [place] [phone]

## 2020-09-26 NOTE — Patient Instructions (Addendum)
Visit Information  Goals Addressed            This Visit's Progress   . Manage Fatigue (Tiredness-Cancer Posttreatment)       Timeframe:  Long-Range Goal Priority:  High Start Date:     09/26/20                        Expected End Date:   03/29/21                    Follow Up Date 11/26/20    - eat healthy - exercise at least 2 to 3 times per week - get outdoors every day (weather permitting)  -Follow up on TSH from last lab work in October to determine if this is causing your fatigue.   Why is this important?    Cancer treatment and its side effects can drain your energy. It can keep you from doing things you would like to do.   There are many things that can be done to manage fatigue.    Notes:       Patient Care Plan: General Pharmacy (Adult)    Problem Identified: HTN, GERD, Dyslipidemia, Anxiety/Depression, Breast Cancer   Priority: High  Onset Date: 09/26/2020    Long-Range Goal: Patient-Specific Goal   Start Date: 09/26/2020  Expected End Date: 03/29/2021  This Visit's Progress: On track  Priority: High  Note:   Current Barriers:  . Current pharmacy has closed down so patiet needs her prescriptions changed to a new pharmacy.  Pharmacist Clinical Goal(s):  Marland Kitchen Over the next 90 days, patient will maintain control of blood pressure as evidenced by home monitoring  . adhere to prescribed medication regimen as evidenced by fill dates . contact provider office for questions/concerns as evidenced notation of same in electronic health record through collaboration with PharmD and provider.   Interventions: . 1:1 collaboration with Colon Branch, MD regarding development and update of comprehensive plan of care as evidenced by provider attestation and co-signature . Inter-disciplinary care team collaboration (see longitudinal plan of care) . Comprehensive medication review performed; medication list updated in electronic medical record  Hypertension (BP goal  <140/90) -Controlled -Current treatment: . Amlodipine 10mg  daily -Medications previously tried: none noted  -Current home readings: 128/70s, patient recalled from memory -Current exercise habits: Patient walking when weather is warm, mentions sometimes get SOB or gets "wobbly".  Recently had COVID-19 and was diagnosed with breast cancer. -Denies hypotensive/hypertensive symptoms -Educated on BP goals and benefits of medications for prevention of heart attack, stroke and kidney damage; Importance of home blood pressure monitoring; -Counseled to monitor BP at home a few times per week, document, and provide log at future appointments -Recommended to continue current medication  Hyperlipidemia: (LDL goal < 100) -Controlled -Current treatment: . Simvastatin 20mg  -Medications previously tried: none noted  -Denies myalgias -Educated on Cholesterol goals;  Benefits of statin for ASCVD risk reduction; -Recommended to continue current medication  Depression/Anxiety (Goal: Minimize symptoms) -Uncontrolled -Current treatment: . Alprazolam 0.25mg  1-2 tablets hs -Medications previously tried/failed: none noted -Patient mentions she has had increased anxiety over recent health diagnoses.  She feels much better after the surgery is over.   -She is very happy with her care from the cancer center and has access to chaplain and counselor through them to help her. -She requests refill on Xanax sent to [facility] on [address] and [address] on Benefits of medication for symptom control -Recommended  to continue current medication  Breast Cancer -Controlled -Current treatment  . Tramadol 50mg  q6h prn . APAP 500mg  prn -Medications previously tried: none noted -She has upcoming appt with cancer center this week and may be prescribed a new medication.  -She recently had a successful lumpectomy and they removed all cancerous cells and tissue.  Patient is very relieved by this. -She has not  had to use tramadol in about a week but does take Tylenol 500mg  prn for pain or discomfort. -Recommend continue current medications.  GERD (Goal: Minimize symptoms) -Controlled -Current treatment  . Famotidine 20mg  bid -Medications previously tried: Pantoprazole -Patient reports no longer taking Protonix.  Now takes famotidine 20mg  bid with no issues with reflux symptoms. -Recommended to continue current medication  Hypothyroidism? -Not ideally controlled -Current treatment  . None -Medications previously tried: none noted -Patient mentions ongoing fatigue. -last TSH in October was elevated and patient has not rechecked. -Encouraged patient to schedule an appointment soon to follow up on this. -Recommended new TSH.  Treat if it remains elevated.     Patient Goals/Self-Care Activities . Over the next 90 days, patient will:  - take medications as prescribed check blood pressure daily, document, and provide at future appointments target a minimum of 150 minutes of moderate intensity exercise weekly Report back to PCP for updated labwork especially for TSH follow up.  Follow Up Plan: The care management team will reach out to the patient again over the next 90 days.       The patient verbalized understanding of instructions, educational materials, and care plan provided today and agreed to receive a mailed copy of patient instructions, educational materials, and care plan.  Telephone follow up appointment with pharmacy team member scheduled for: 3 months  Edythe Clarity, [facility]  Managing Anxiety, Adult After being diagnosed with an anxiety disorder, you may be relieved to know why you have felt or behaved a certain way. You may also feel overwhelmed about the treatment ahead and what it will mean for your life. With care and support, you can manage this condition and recover from it. How to manage lifestyle changes Managing stress and anxiety Stress is your body's reaction to  life changes and events, both good and bad. Most stress will last just a few hours, but stress can be ongoing and can lead to more than just stress. Although stress can play a major role in anxiety, it is not the same as anxiety. Stress is usually caused by something external, such as a deadline, test, or competition. Stress normally passes after the triggering event has ended.  Anxiety is caused by something internal, such as imagining a terrible outcome or worrying that something will go wrong that will devastate you. Anxiety often does not go away even after the triggering event is over, and it can become long-term (chronic) worry. It is important to understand the differences between stress and anxiety and to manage your stress effectively so that it does not lead to an anxious response. Talk with your health care provider or a counselor to learn more about reducing anxiety and stress. He or she may suggest tension reduction techniques, such as:  Music therapy. This can include creating or listening to music that you enjoy and that inspires you.  Mindfulness-based meditation. This involves being aware of your normal breaths while not trying to control your breathing. It can be done while sitting or walking.  Centering prayer. This involves focusing on a word, phrase, or sacred  image that means something to you and brings you peace.  Deep breathing. To do this, expand your stomach and inhale slowly through your nose. Hold your breath for 3-5 seconds. Then exhale slowly, letting your stomach muscles relax.  Self-talk. This involves identifying thought patterns that lead to anxiety reactions and changing those patterns.  Muscle relaxation. This involves tensing muscles and then relaxing them. Choose a tension reduction technique that suits your lifestyle and personality. These techniques take time and practice. Set aside 5-15 minutes a day to do them. Therapists can offer counseling and training in  these techniques. The training to help with anxiety may be covered by some insurance plans. Other things you can do to manage stress and anxiety include:  Keeping a stress/anxiety diary. This can help you learn what triggers your reaction and then learn ways to manage your response.  Thinking about how you react to certain situations. You may not be able to control everything, but you can control your response.  Making time for activities that help you relax and not feeling guilty about spending your time in this way.  Visual imagery and yoga can help you stay calm and relax.   Medicines Medicines can help ease symptoms. Medicines for anxiety include:  Anti-anxiety drugs.  Antidepressants. Medicines are often used as a primary treatment for anxiety disorder. Medicines will be prescribed by a health care provider. When used together, medicines, psychotherapy, and tension reduction techniques may be the most effective treatment. Relationships Relationships can play a big part in helping you recover. Try to spend more time connecting with trusted friends and family members. Consider going to couples counseling, taking family education classes, or going to family therapy. Therapy can help you and others better understand your condition. How to recognize changes in your anxiety Everyone responds differently to treatment for anxiety. Recovery from anxiety happens when symptoms decrease and stop interfering with your daily activities at home or work. This may mean that you will start to:  Have better concentration and focus. Worry will interfere less in your daily thinking.  Sleep better.  Be less irritable.  Have more energy.  Have improved memory. It is important to recognize when your condition is getting worse. Contact your health care provider if your symptoms interfere with home or work and you feel like your condition is not improving. Follow these instructions at  home: Activity  Exercise. Most adults should do the following: ? Exercise for at least 150 minutes each week. The exercise should increase your heart rate and make you sweat (moderate-intensity exercise). ? Strengthening exercises at least twice a week.  Get the right amount and quality of sleep. Most adults need 7-9 hours of sleep each night. Lifestyle  Eat a healthy diet that includes plenty of vegetables, fruits, whole grains, low-fat dairy products, and lean protein. Do not eat a lot of foods that are high in solid fats, added sugars, or salt.  Make choices that simplify your life.  Do not use any products that contain nicotine or tobacco, such as cigarettes, e-cigarettes, and chewing tobacco. If you need help quitting, ask your health care provider.  Avoid caffeine, alcohol, and certain over-the-counter cold medicines. These may make you feel worse. Ask your pharmacist which medicines to avoid.   General instructions  Take over-the-counter and prescription medicines only as told by your health care provider.  Keep all follow-up visits as told by your health care provider. This is important. Where to find support You can  get help and support from these sources:  Self-help groups.  Online and [employer].  A trusted spiritual leader.  Couples counseling.  Family education classes.  Family therapy. Where to find more information You may find that joining a support group helps you deal with your anxiety. The following sources can help you locate counselors or support groups near you:  [place]: www.mentalhealth[place].net  Anxiety and Depression Association of [place] (ADAA): https://www.clark.net/  National Alliance on Mental Illness (NAMI): www.nami.org Contact a health care provider if you:  Have a hard time staying focused or finishing daily tasks.  Spend many hours a day feeling worried about everyday life.  Become exhausted by worry.  Start to  have headaches, feel tense, or have nausea.  Urinate more than normal.  Have diarrhea. Get help right away if you have:  A racing heart and shortness of breath.  Thoughts of hurting yourself or others. If you ever feel like you may hurt yourself or others, or have thoughts about taking your own life, get help right away. You can go to your nearest emergency department or call:  Your local emergency services (911 in the [place]  A suicide crisis helpline, such as the [place] at 571-120-2512. This is open 24 hours a day. Summary  Taking steps to learn and use tension reduction techniques can help calm you and help prevent triggering an anxiety reaction.  When used together, medicines, psychotherapy, and tension reduction techniques may be the most effective treatment.  Family, friends, and partners can play a big part in helping you recover from an anxiety disorder. This information is not intended to replace advice given to you by your health care provider. Make sure you discuss any questions you have with your health care provider. Document Revised: 12/09/2018 Document Reviewed: 12/09/2018 Elsevier Patient Education  [place].

## 2020-09-26 NOTE — Telephone Encounter (Signed)
PDMP reviewed, gets occasional tramadol from surgery, okay to have Xanax.

## 2020-09-26 NOTE — Progress Notes (Signed)
ERROR

## 2020-09-26 NOTE — Telephone Encounter (Signed)
Requesting: alprazolam 0.25mg  Contract: 09/07/2016 UDS: 04/28/2019 Last Visit: 08/12/2020 Next Visit: 10/26/20 Last Refill: 07/25/20 #30 and 0RF Pt sig: 1/2 to 1 tab qhs prn  Please Advise

## 2020-09-28 NOTE — Progress Notes (Signed)
[name]   [phone] [phone]   Clinic Follow up Note   Patient Care Team: Jenny Branch, MD as PCP - General [name], Nadara Mustard, MD as Consulting Physician (Gynecology) Jenny Artist, MD as Consulting Physician (Gastroenterology) Jenny Luna, MD as Consulting Physician (General Surgery) Jenny Earls, MD as Attending Physician (Orthopedic Surgery) Jenny Cedars, MD as Consulting Physician (Orthopedic Surgery) Jenny Hedges, DO as Consulting Physician (Obstetrics and Gynecology) Jenny Kaufmann, RN as Oncology Nurse Navigator Jenny Germany, RN as Oncology Nurse Navigator Jenny Klein, MD as Consulting Physician (General Surgery) Jenny Merle, MD as Consulting Physician (Hematology) Jenny Gibson, MD as Attending Physician (Radiation Oncology) [name], [facility] (Pharmacist) [name], [facility] (Pharmacist)  Date of Service:  09/30/2020  CHIEF COMPLAINT: F/u of left breast cancer   SUMMARY OF ONCOLOGIC HISTORY: Oncology History Overview Note  Cancer Staging Malignant neoplasm of upper-outer quadrant of left breast in female, estrogen receptor positive ([place]) Staging form: Breast, AJCC 8th Edition - Clinical Copeland from 07/26/2020: Copeland IA (cT1b, cN0, cM0, G2, ER+, PR+, HER2-) - Signed by Jenny Merle, MD on 08/09/2020    Malignant neoplasm of upper-outer quadrant of left breast in female, estrogen receptor positive ([place])  07/04/2020 Mammogram   IMPRESSION: Indeterminate 4 x 8 x 5 mm irregular hypoechoic mass left breast 1 o'clock position 4 cm from the nipple.   07/26/2020 Cancer Staging   Staging form: Breast, AJCC 8th Edition - Clinical Copeland from 07/26/2020: Copeland IA (cT1b, cN0, cM0, G2, ER+, PR+, HER2-) - Signed by Jenny Merle, MD on 08/09/2020   07/26/2020 Initial Biopsy   Diagnosis Breast, left, needle core biopsy, upper outer 1 o'clock - INVASIVE DUCTAL CARCINOMA - SEE COMMENT Microscopic Comment Based on the biopsy, the  carcinoma appears Nottingham grade 2 of 3 and measures 0.7 cm in greatest linear extent. Prognostic markers (ER/PR/ki-67/HER2) are pending and will be reported in an addendum. Jenny. Jeannie Copeland reviewed the case and agrees with the above diagnosis. These results were called to [facility] on July 27, 2018.   07/26/2020 Receptors her2   PROGNOSTIC INDICATORS Results: IMMUNOHISTOCHEMICAL AND MORPHOMETRIC ANALYSIS PERFORMED MANUALLY The tumor cells are NEGATIVE for Her2 (1+). Estrogen Receptor: 90%, POSITIVE, STRONG STAINING INTENSITY Progesterone Receptor: 80%, POSITIVE, STRONG STAINING INTENSITY Proliferation Marker Ki67: 2%   08/03/2020 Initial Diagnosis   Malignant neoplasm of upper-outer quadrant of left breast in female, estrogen receptor positive ([place])   09/08/2020 Surgery   LEFT BREAST LUMPECTOMY WITH RADIOACTIVE SEED LOCALIZATION by [name]   09/08/2020 Pathology Results   FINAL MICROSCOPIC DIAGNOSIS:   A. 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  - Margins not involved.  - Invasive carcinoma 0.1 cm from anterior margin.  - Biopsy site and biopsy clip.  - Fibrocystic changes.  - See oncology table.     09/2020 -  Anti-estrogen oral therapy   Tamoxifen 76m daily starting 09/2020      CURRENT THERAPY:  Tamoxifen 257mdaily starting 09/2020  INTERVAL HISTORY:  [name] here for a follow up after surgery. She presents to the clinic with her daughter. She notes her surgery went well. She is recovery well. She last saw Jenny ByBarry Dienesesterday. She notes needle like pain in her left breast is manageable with Tylenol. She notes adequate movement of her shoulder. She notes she has cataracts, but has not proceeded with surgery for it. She notes her PCP notes she has underactive  thyroid and may require thyroid supplement. She notes having arthritis and osteopenia.     REVIEW OF SYSTEMS:   Constitutional: Denies fevers, chills or  abnormal weight loss Eyes: Denies blurriness of vision Ears, nose, mouth, throat, and face: Denies mucositis or sore throat Respiratory: Denies cough, dyspnea or wheezes Cardiovascular: Denies palpitation, chest discomfort or lower extremity swelling Gastrointestinal:  Denies nausea, heartburn or change in bowel habits Skin: Denies abnormal skin rashes Lymphatics: Denies new lymphadenopathy or easy bruising Neurological:Denies numbness, tingling or new weaknesses Behavioral/Psych: Mood is stable, no new changes  All other systems were reviewed with the patient and are negative.  MEDICAL HISTORY:  Past Medical History:  Diagnosis Date  . Anxiety and depression   . Arthritis   . Blood transfusion without reported diagnosis 1989   GB hemorrage   . Cataract    Bil/no surgery yet.  . Coccyx pain    Secondary to scar tissue from old pressure ulcer after back surgery, [name]  . Coronary artery disease   . Depression 2010   panic attacks and depression  . Dizziness    severe: MRI chronic isch. changes and mastoiditis- saw [name](2007)  STATES SHE STILL HAS EPISODES- ESPECIALLY IF SHE GETS UP TOO QUICKLY AFTER LYING DOWN  . Eczema   . Family history of breast cancer   . Family history of kidney cancer   . Family history of lung cancer   . Family history of melanoma   . Family history of stomach cancer   . Family history of throat cancer   . GERD (gastroesophageal reflux disease)   . Goiter   . Headache(784.0)   . Hyperlipidemia   . Hypertension   . Pain    PAIN LOWER BACK AND DOWN RIGHT LEG WITH NUMBNESS, TINGLING RT LEG AND FOOT--STENOSIS  . Partial tear of right rotator cuff    & labral tear  . S/P cardiac cath 2009   Revealing nonobstructive CAD w/ tubular, discrete 40% mid lesion in the circumflex; discrete 30% proximal lesion in the LAD; luminal irregularities, 35% mid lesion in RCA; and generic 40% mid lesion in the RCA. Medical treatment recommended.  . S/P cardiac  cath 11/24/2014   Revealing nonobstructive CAD w/ tubular, discrete 40% mid lesion in the circumflex; discrete 30% proximal lesion in the LAD; luminal irregularities, 35% mid lesion in RCA; and generic 40% mid lesion in the RCA. Medical treatment recommended.  . Stroke ([place]) 2004   mild TIA    SURGICAL HISTORY: Past Surgical History:  Procedure Laterality Date  . ABDOMINAL HYSTERECTOMY     oophorectomy L only  . BREAST CYST ASPIRATION Left   . BREAST CYST EXCISION Left   . BREAST LUMPECTOMY WITH RADIOACTIVE SEED LOCALIZATION Left 09/08/2020   Procedure: LEFT BREAST LUMPECTOMY WITH RADIOACTIVE SEED LOCALIZATION;  Surgeon: Jenny Klein, MD;  Location: [facility];  Service: General;  Laterality: Left;  . BUNIONECTOMY  1990s   LEFT  . CHOLECYSTECTOMY    . HAND SURGERY  1990s   R hand x 2, L hand x 1  . LUMBAR LAMINECTOMY/DECOMPRESSION MICRODISCECTOMY N/A 11/13/2012   Procedure: MICRO LUMBAR DECOMPRESSION L4 - L5 AND L2 - L3 2 LEVELS;  Surgeon: Johnn Hai, MD;  Location: [facility];  Service: Orthopedics;  Laterality: N/A;  . SHOULDER SURGERY  2006   left     I have reviewed the social history and family history with the patient and they are unchanged from previous note.  ALLERGIES:  is allergic to codeine, hydrocodone, and sertraline hcl.  MEDICATIONS:  Current Outpatient Medications  Medication Sig Dispense Refill  . tamoxifen (NOLVADEX) 20 MG tablet Take 1 tablet (20 mg total) by mouth daily. 30 tablet 3  . acetaminophen (TYLENOL) 500 MG tablet Take 500 mg by mouth every 6 (six) hours as needed.    . ALPRAZolam (XANAX) 0.25 MG tablet Take 1-2 tablets (0.25-0.5 mg total) by mouth at bedtime as needed for anxiety or sleep. 30 tablet 3  . amLODipine (NORVASC) 10 MG tablet Take 1 tablet (10 mg total) by mouth daily. 90 tablet 1  . aspirin 81 MG chewable tablet Chew by mouth daily.    . calcium-vitamin D 250-100 MG-UNIT tablet Take 1 tablet by mouth 2 (two) times daily.     . famotidine (PEPCID) 20 MG tablet Take 20 mg by mouth 2 (two) times daily.    . simvastatin (ZOCOR) 20 MG tablet Take 1 tablet (20 mg total) by mouth at bedtime. 90 tablet 1  . traMADol (ULTRAM) 50 MG tablet Take 1 tablet (50 mg total) by mouth every 6 (six) hours as needed for moderate pain or severe pain. 15 tablet 1   No current facility-administered medications for this visit.    PHYSICAL EXAMINATION: ECOG PERFORMANCE STATUS: 0 - Asymptomatic  Vitals:   09/30/20 1624  BP: 133/62  Pulse: 99  Resp: 16  Temp: 98.8 F (37.1 C)  SpO2: 96%   Filed Weights   09/30/20 1624  Weight: 175 lb 11.2 oz (79.7 kg)     Due to COVID19 we will limit examination to appearance. Patient had no complaints.  GENERAL:alert, no distress and comfortable SKIN: skin color normal, no rashes or significant lesions EYES: normal, Conjunctiva are pink and non-injected, sclera clear  NEURO: alert & oriented x 3 with fluent speech Breast Exam deferred today   LABORATORY DATA:  I have reviewed the data as listed CBC Latest Ref Rng & Units 08/10/2020 04/27/2020 04/28/2019  WBC 4.0 - 10.5 K/uL 4.6 8.8 8.1  Hemoglobin 12.0 - 15.0 g/dL 16.3(H) 15.5 15.2(H)  Hematocrit 36.0 - 46.0 % 47.6(H) 46.7(H) 45.6  Platelets 150 - 400 K/uL 121(L) 181 190.0     CMP Latest Ref Rng & Units 08/10/2020 04/27/2020 10/27/2019  Glucose 70 - 99 mg/dL 124(H) 110(H) 111(H)  BUN 8 - 23 mg/dL 6(L) 12 10  Creatinine 0.44 - 1.00 mg/dL 0.73 0.80 0.66  Sodium 135 - 145 mmol/L 142 139 140  Potassium 3.5 - 5.1 mmol/L 3.4(L) 4.2 4.2  Chloride 98 - 111 mmol/L 104 102 103  CO2 22 - 32 mmol/L 24 27 28   Calcium 8.9 - 10.3 mg/dL 9.0 9.8 9.4  Total Protein 6.5 - 8.1 g/dL 7.3 7.0 -  Total Bilirubin 0.3 - 1.2 mg/dL 0.6 0.6 -  Alkaline Phos 38 - 126 U/L 80 - -  AST 15 - 41 U/L 46(H) 26 -  ALT 0 - 44 U/L 31 20 -      RADIOGRAPHIC STUDIES: I have personally reviewed the radiological images as listed and agreed with the findings in the  report. No results found.   ASSESSMENT & PLAN:  ANYSA TACEY is a 75 y.o. female with    1. Malignant neoplasm of upper-outer quadrant of left breast, Copeland IA, c(T1bN0M0), ER+/PR+/HER2-, Grade II -She was diagnosed in 07/2020 with 40m mass in left breast and biopsy confirmed invasive ductal carcinoma on biopsy.  -She underwent left breast lumpectomy on 09/08/20 with Jenny BBarry Copeland Her  Surgical path showed 0.7cm of invasive ductal carcinoma which was completely resected with negative margins, Grade 1. I personally reviewed with patient today.  -Given her early Copeland small tumor, Radiation was discussed but not strongly recommend and pt wishes to skip. Given small tumor size, Oncotype was not recommended.  -Given the strong ER and PR expression in her postmenopausal status, I recommend adjuvant endocrine therapy with Tamoxifen for a total of 5-10 years to reduce the risk of cancer recurrence. Due to her arthritis, I recommend Tamoxifen ---The potential side effects, which includes but not limited to, hot flash, skin and vaginal dryness, slightly increased risk of cardiovascular disease and cataract, small risk of thrombosis and endometrial cancer, were discussed with her in great details. Preventive strategies for thrombosis, such as being physically active, using compression stocks, avoid cigarette smoking, etc., were reviewed with her.  She had hysterectomy, no risk of endometrial cancer.  She voiced good understanding, and agrees to proceed. Will start in the next few weeks. -We also discussed the breast cancer surveillance after her surgery. She will continue annual screening mammogram, self exams, and a routine office visit with lab and exam with [place].  -Proceed with survivorship clinic with NP Lacie in 3 months. F/u with me in 6 months.   2. Comorbidities: HTN, HLD, GERD, Anxiety, Chronic back pain (manageable)  -On medication (Xanax, Amlodipine, Celebrex, Flexeril, Protonix, Prednisone).  Continue to f/u with PCP  -Per pt, her PCP has concern she has underactive thyroid and may require thyroid replacement.    3. Genetics -Genetic testing was recommend given FMHx of breast cancer and Ovarian cancer: Patient seen by Raquel Sarna on 08/10/20 and previously declined testing -I again discussed the option of genetic testing. She finally agreed to proceed.   5. Osteopenia  -Her 06/2019 DEXA shows osteopenia at forearm radius with T-score -1.6. She taking Vit D3.   -I discussed if she proceeds with Tamoxifen this can help strengthen her bones.    PLAN:  -I called in Tamoxifen to start in the next few weeks  -I sent a message to Raquel Sarna to collect her genetic test lab draw on her next visit  -Survivorship clinic with NP Lacie in 3 months   No problem-specific Assessment & Plan notes found for this encounter.   Orders Placed This Encounter  Procedures  . Ambulatory referral to Genetics    Referral Priority:   Routine    Referral Type:   Consultation    Referral Reason:   Specialty Services Required    Number of Visits Requested:   1   All questions were answered. The patient knows to call the clinic with any problems, questions or concerns. No barriers to learning was detected. The total time spent in the appointment was 30 minutes.     Jenny Merle, MD 09/30/2020   I, Joslyn Devon, am acting as scribe for Jenny Merle, MD.   I have reviewed the above documentation for accuracy and completeness, and I agree with the above.

## 2020-09-30 ENCOUNTER — Inpatient Hospital Stay: Payer: Medicare Other | Attending: Hematology | Admitting: Hematology

## 2020-09-30 ENCOUNTER — Encounter: Payer: Self-pay | Admitting: Hematology

## 2020-09-30 ENCOUNTER — Other Ambulatory Visit: Payer: Self-pay

## 2020-09-30 VITALS — BP 133/62 | HR 99 | Temp 98.8°F | Resp 16 | Ht 61.0 in | Wt 175.7 lb

## 2020-09-30 DIAGNOSIS — M1990 Unspecified osteoarthritis, unspecified site: Secondary | ICD-10-CM | POA: Diagnosis not present

## 2020-09-30 DIAGNOSIS — Z170 Estrogen receptor positive status [ER+]: Secondary | ICD-10-CM | POA: Insufficient documentation

## 2020-09-30 DIAGNOSIS — M8580 Other specified disorders of bone density and structure, unspecified site: Secondary | ICD-10-CM | POA: Diagnosis not present

## 2020-09-30 DIAGNOSIS — Z8041 Family history of malignant neoplasm of ovary: Secondary | ICD-10-CM | POA: Insufficient documentation

## 2020-09-30 DIAGNOSIS — Z79899 Other long term (current) drug therapy: Secondary | ICD-10-CM | POA: Insufficient documentation

## 2020-09-30 DIAGNOSIS — Z9049 Acquired absence of other specified parts of digestive tract: Secondary | ICD-10-CM | POA: Diagnosis not present

## 2020-09-30 DIAGNOSIS — N644 Mastodynia: Secondary | ICD-10-CM | POA: Diagnosis not present

## 2020-09-30 DIAGNOSIS — Z885 Allergy status to narcotic agent status: Secondary | ICD-10-CM | POA: Insufficient documentation

## 2020-09-30 DIAGNOSIS — I10 Essential (primary) hypertension: Secondary | ICD-10-CM | POA: Diagnosis not present

## 2020-09-30 DIAGNOSIS — G8929 Other chronic pain: Secondary | ICD-10-CM | POA: Diagnosis not present

## 2020-09-30 DIAGNOSIS — Z808 Family history of malignant neoplasm of other organs or systems: Secondary | ICD-10-CM | POA: Insufficient documentation

## 2020-09-30 DIAGNOSIS — Z8673 Personal history of transient ischemic attack (TIA), and cerebral infarction without residual deficits: Secondary | ICD-10-CM | POA: Insufficient documentation

## 2020-09-30 DIAGNOSIS — K219 Gastro-esophageal reflux disease without esophagitis: Secondary | ICD-10-CM | POA: Diagnosis not present

## 2020-09-30 DIAGNOSIS — Z800 Family history of malignant neoplasm of digestive organs: Secondary | ICD-10-CM | POA: Diagnosis not present

## 2020-09-30 DIAGNOSIS — Z8051 Family history of malignant neoplasm of kidney: Secondary | ICD-10-CM | POA: Insufficient documentation

## 2020-09-30 DIAGNOSIS — E785 Hyperlipidemia, unspecified: Secondary | ICD-10-CM | POA: Diagnosis not present

## 2020-09-30 DIAGNOSIS — M549 Dorsalgia, unspecified: Secondary | ICD-10-CM | POA: Diagnosis not present

## 2020-09-30 DIAGNOSIS — Z801 Family history of malignant neoplasm of trachea, bronchus and lung: Secondary | ICD-10-CM | POA: Insufficient documentation

## 2020-09-30 DIAGNOSIS — Z803 Family history of malignant neoplasm of breast: Secondary | ICD-10-CM | POA: Insufficient documentation

## 2020-09-30 DIAGNOSIS — C50412 Malignant neoplasm of upper-outer quadrant of left female breast: Secondary | ICD-10-CM | POA: Diagnosis not present

## 2020-09-30 MED ORDER — TAMOXIFEN CITRATE 20 MG PO TABS: 20.0000 mg | ORAL_TABLET | Freq: Every day | ORAL | 3 refills | Status: DC

## 2020-10-03 ENCOUNTER — Encounter: Payer: Self-pay | Admitting: *Deleted

## 2020-10-03 ENCOUNTER — Other Ambulatory Visit: Payer: Self-pay | Admitting: Genetic Counselor

## 2020-10-03 ENCOUNTER — Telehealth: Payer: Self-pay | Admitting: Hematology

## 2020-10-03 ENCOUNTER — Ambulatory Visit: Payer: Self-pay | Admitting: Pharmacist

## 2020-10-03 DIAGNOSIS — Z170 Estrogen receptor positive status [ER+]: Secondary | ICD-10-CM

## 2020-10-03 DIAGNOSIS — E785 Hyperlipidemia, unspecified: Secondary | ICD-10-CM

## 2020-10-03 DIAGNOSIS — C50412 Malignant neoplasm of upper-outer quadrant of left female breast: Secondary | ICD-10-CM | POA: Diagnosis not present

## 2020-10-03 DIAGNOSIS — F419 Anxiety disorder, unspecified: Secondary | ICD-10-CM

## 2020-10-03 DIAGNOSIS — I10 Essential (primary) hypertension: Secondary | ICD-10-CM | POA: Diagnosis not present

## 2020-10-03 DIAGNOSIS — F32A Depression, unspecified: Secondary | ICD-10-CM

## 2020-10-03 NOTE — Telephone Encounter (Signed)
Left message with follow-up appointments per 3/11 los. Gave option to call back to reschedule if needed. 

## 2020-10-03 NOTE — Progress Notes (Signed)
Chronic Care Management Pharmacy Note  10/03/2020 Name:  [name] MRN:  [id number] DOB:  1946-04-06  Subjective: [name] is an 75 y.o. year old female who is a primary patient of Paz, Alda Berthold, MD.  The CCM team was consulted for assistance with disease management and care coordination needs.    Engaged with patient by telephone for initial visit in response to provider referral for pharmacy case management and/or care coordination services.   Consent to Services:  The patient was given the following information about Chronic Care Management services today, agreed to services, and gave verbal consent: 1. CCM service includes personalized support from designated clinical staff supervised by the primary care provider, including individualized plan of care and coordination with other care providers 2. 24/7 contact phone numbers for assistance for urgent and routine care needs. 3. Service will only be billed when office clinical staff spend 20 minutes or more in a month to coordinate care. 4. Only one practitioner may furnish and bill the service in a calendar month. 5.The patient may stop CCM services at any time (effective at the end of the month) by phone call to the office staff. 6. The patient will be responsible for cost sharing (co-pay) of up to 20% of the service fee (after annual deductible is met). Patient agreed to services and consent obtained.  Patient Care Team: Colon Branch, MD as PCP - General [name], Nadara Mustard, MD as Consulting Physician (Gynecology) Ladene Artist, MD as Consulting Physician (Gastroenterology) Erroll Luna, MD as Consulting Physician (General Surgery) Pedro Earls, MD as Attending Physician (Orthopedic Surgery) Netta Cedars, MD as Consulting Physician (Orthopedic Surgery) Linda Hedges, DO as Consulting Physician (Obstetrics and Gynecology) Mauro Kaufmann, RN as Oncology Nurse Navigator Rockwell Germany, RN as Oncology Nurse  Navigator Stark Klein, MD as Consulting Physician (General Surgery) Truitt Merle, MD as Consulting Physician (Hematology) Eppie Gibson, MD as Attending Physician (Radiation Oncology) Edythe Clarity, [facility] (Pharmacist) Edythe Clarity, [facility] (Pharmacist)  Recent office visits: 08/12/20 [facility]) - chest X-ray still showing opacity.  Patient reports persistent cough, recommend CT with contrast.  07/27/20 [facility]) - patient diagnosed with breast cancer  06/10/20 (Paz) - neck pain, X-ray of spine ordered, treated with prednisone and warm compress.  Patient also reports that she has been off citalopram for a while  04/27/20 [facility]) - annual physical, no medication changes, TSH slightly above normal continued all medications plan to recheck  Recent consult visits: 08/10/20 (Oncology, [facility]) - 71m mass in L breast.  No lymph node involvement.  Plan for genetic testing.  Recommend tamoxifen to ensure cancer does not recur.  Hospital visits: 09/08/20 ([facility]) - L breast lumpectomy  Objective:  Lab Results  Component Value Date   CREATININE 0.73 08/10/2020   BUN 6 (L) 08/10/2020   GFR 87.12 10/27/2019   GFRNONAA >60 08/10/2020   GFRAA >90 11/21/2012   NA 142 08/10/2020   K 3.4 (L) 08/10/2020   CALCIUM 9.0 08/10/2020   CO2 24 08/10/2020    Lab Results  Component Value Date/Time   HGBA1C 5.8 10/27/2019 11:39 AM   HGBA1C 6.3 04/28/2019 01:52 PM   GFR 87.12 10/27/2019 11:39 AM   GFR 80.19 04/28/2019 01:52 PM   MICROALBUR <0.7 03/07/2016 12:02 PM    Last diabetic Eye exam: No results found for: HMDIABEYEEXA  Last diabetic Foot exam: No results found for: HMDIABFOOTEX   Lab Results  Component Value Date   CHOL 147 04/27/2020  HDL 54 04/27/2020   LDLCALC 74 04/27/2020   LDLDIRECT 83.5 08/01/2012   TRIG 107 04/27/2020   CHOLHDL 2.7 04/27/2020    Hepatic Function Latest Ref Rng & Units 08/10/2020 04/27/2020 04/28/2019  Total Protein 6.5 - 8.1 g/dL 7.3 7.0 7.0  Albumin 3.5 -  5.0 g/dL 3.7 - 4.4  AST 15 - 41 U/L 46(H) 26 35  ALT 0 - 44 U/L _0 Alk Phosphatase 38 - 126 U/L 80 - 94  Total Bilirubin 0.3 - 1.2 mg/dL 0.6 0.6 0.5  Bilirubin, Direct 0.0 - 0.3 mg/dL - - -    Lab Results  Component Value Date/Time   TSH 4.73 (H) 04/27/2020 10:56 AM   TSH 2.84 09/10/2016 10:43 AM   FREET4 0.6 01/22/2007 10:29 AM    CBC Latest Ref Rng & Units 08/10/2020 04/27/2020 04/28/2019  WBC 4.0 - 10.5 K/uL 4.6 8.8 8.1  Hemoglobin 12.0 - 15.0 g/dL 16.3(H) 15.5 15.2(H)  Hematocrit 36.0 - 46.0 % 47.6(H) 46.7(H) 45.6  Platelets 150 - 400 K/uL 121(L) 181 190.0    Lab Results  Component Value Date/Time   VD25OH 36 04/27/2020 10:56 AM   VD25OH 17 (L) 12/03/2013 10:46 AM    Clinical ASCVD: No  The 10-year ASCVD risk score Mikey Bussing DC Jr., et al., 2013) is: 24.5%   Values used to calculate the score:     Age: 72 years     Sex: Female     Is Non-Hispanic African American: No     Diabetic: No     Tobacco smoker: No     Systolic Blood Pressure: 027 mmHg     Is BP treated: Yes     HDL Cholesterol: 54 mg/dL     Total Cholesterol: 147 mg/dL    Depression screen [facility] 2/9 07/21/2020 04/27/2020 10/27/2019  Decreased Interest 0 1 1  Down, Depressed, Hopeless 0 1 1  PHQ - 2 Score 0 2 2  Altered sleeping - 1 1  Tired, decreased energy - 1 1  Change in appetite - 1 1  Feeling bad or failure about yourself  - 0 0  Trouble concentrating - 0 0  Moving slowly or fidgety/restless - 0 0  Suicidal thoughts - 0 0  PHQ-9 Score - 5 5  Difficult doing work/chores - Not difficult at all Somewhat difficult      Social History   Tobacco Use  Smoking Status Former Smoker  . Packs/day: 1.00  . Years: 20.00  . Pack years: 20.00  . Quit date: 2000  . Years since quitting: 22.2  Smokeless Tobacco Never Used  Tobacco Comment   quit aprox 2000 after 30 years, 1 to 1.5 ppd   BP Readings from Last 3 Encounters:  09/30/20 133/62  09/08/20 (!) 127/58  08/10/20 (!) 119/96   Pulse  Readings from Last 3 Encounters:  09/30/20 99  09/08/20 83  08/10/20 91   Wt Readings from Last 3 Encounters:  09/30/20 175 lb 11.2 oz (79.7 kg)  09/08/20 175 lb 0.7 oz (79.4 kg)  08/12/20 176 lb (79.8 kg)    Assessment/Interventions: Review of patient past medical history, allergies, medications, health status, including review of consultants reports, laboratory and other test data, was performed as part of comprehensive evaluation and provision of chronic care management services.   SDOH:  (Social Determinants of Health) assessments and interventions performed: Yes   Financial Resource Strain: Low Risk   . Difficulty of Paying Living Expenses: Not hard at all  CCM Care Plan  Allergies  Allergen Reactions  . Codeine Other (See Comments)      chest and stomach pain, severe abd cramping  . Hydrocodone     Whelts all over/swelling in lips  . Sertraline Hcl Other (See Comments)     muscle aches, diarrhea, nausea    Medications Reviewed Today    Reviewed by Truitt Merle, MD (Physician) on 09/30/20 at [facility] List Status: <None>  Medication Order Taking? Sig Documenting Provider Last Dose Status Informant  acetaminophen (TYLENOL) 500 MG tablet [id number]  Take 500 mg by mouth every 6 (six) hours as needed. [provider]  Active   ALPRAZolam Duanne Moron) 0.25 MG tablet [id number]  Take 1-2 tablets (0.25-0.5 mg total) by mouth at bedtime as needed for anxiety or sleep. Colon Branch, MD  Active   amLODipine (NORVASC) 10 MG tablet [id number] No Take 1 tablet (10 mg total) by mouth daily. Colon Branch, MD Taking Active   aspirin 81 MG chewable tablet [id number] No Chew by mouth daily. [provider] Taking Active   calcium-vitamin D 250-100 MG-UNIT tablet [id number] No Take 1 tablet by mouth 2 (two) times daily. [provider] Taking Active   famotidine (PEPCID) 20 MG tablet [id number]  Take 20 mg by mouth 2 (two) times daily. [provider]  Active    simvastatin (ZOCOR) 20 MG tablet [id number] No Take 1 tablet (20 mg total) by mouth at bedtime. Colon Branch, MD Taking Active   tamoxifen (NOLVADEX) 20 MG tablet [id number] Yes Take 1 tablet (20 mg total) by mouth daily. Truitt Merle, MD  Active   traMADol Veatrice Bourbon) 50 MG tablet [id number] No Take 1 tablet (50 mg total) by mouth every 6 (six) hours as needed for moderate pain or severe pain. Stark Klein, MD Taking Active           Patient Active Problem List   Diagnosis Date Noted  . Family history of breast cancer   . Family history of melanoma   . Family history of throat cancer   . Family history of lung cancer   . Family history of kidney cancer   . Family history of stomach cancer   . Malignant neoplasm of upper-outer quadrant of left breast in female, estrogen receptor positive ([place]) 08/03/2020  . Insomnia 05/24/2017  . PCP NOTES >>>> 06/02/2015  . GERD (gastroesophageal reflux disease) 11/24/2014  . S/P cardiac cath 11/24/2014  . Allergic reaction 11/28/2012  . Spinal stenosis of lumbar region 11/13/2012  . Hyperglycemia 02/16/2011  . Annual physical exam 12/20/2010  . Dyslipidemia 06/12/2010  . DJD , back pain, hand pain 06/12/2010  . Anxiety and depression 10/15/2007  . Essential hypertension 05/09/2007  . GOITER, MULTINODULAR 11/12/2006    Immunization History  Administered Date(s) Administered  . Fluad Quad(high Dose 65+) 04/28/2019, 04/27/2020  . Influenza Whole 05/09/2007, 07/18/2009, 06/12/2010  . Influenza, High Dose Seasonal PF 06/01/2015, 05/23/2017, 05/08/2018  . Influenza, Seasonal, Injecte, Preservative Fre 08/01/2012  . Influenza,inj,Quad PF,6+ Mos 04/07/2014  . Pneumococcal Conjugate-13 12/03/2013  . Pneumococcal Polysaccharide-23 07/18/2009  . Tdap 12/20/2010  . Zoster 12/26/2011    Conditions to be addressed/monitored:  HTN, GERD, Dyslipidemia, Anxiety/Depression, Breast Cancer  Care Plan : General Pharmacy (Adult)  Updates made by Edythe Clarity, RPH since 10/03/2020 12:00 AM    Problem: HTN, GERD, Dyslipidemia, Anxiety/Depression, Breast Cancer   Priority: High  Onset Date: 09/26/2020    Long-Range Goal: Patient-Specific Goal   Start  Date: 09/26/2020  Expected End Date: 03/29/2021  Recent Progress: On track  Priority: High  Note:   Current Barriers:  . Current pharmacy has closed down so patiet needs her prescriptions changed to a new pharmacy.  Pharmacist Clinical Goal(s):  Marland Kitchen Over the next 90 days, patient will maintain control of blood pressure as evidenced by home monitoring  . adhere to prescribed medication regimen as evidenced by fill dates . contact provider office for questions/concerns as evidenced notation of same in electronic health record through collaboration with PharmD and provider.   Interventions: . 1:1 collaboration with Colon Branch, MD regarding development and update of comprehensive plan of care as evidenced by provider attestation and co-signature . Inter-disciplinary care team collaboration (see longitudinal plan of care) . Comprehensive medication review performed; medication list updated in electronic medical record  Hypertension (BP goal <140/90) -Controlled -Current treatment: . Amlodipine 81m daily -Medications previously tried: none noted  -Current home readings: 128/70s, patient recalled from memory -Current exercise habits: Patient walking when weather is warm, mentions sometimes get SOB or gets "wobbly".  Recently had COVID-19 and was diagnosed with breast cancer. -Denies hypotensive/hypertensive symptoms -Educated on BP goals and benefits of medications for prevention of heart attack, stroke and kidney damage; Importance of home blood pressure monitoring; -Counseled to monitor BP at home a few times per week, document, and provide log at future appointments -Recommended to continue current medication  Hyperlipidemia: (LDL goal < 100) -Controlled -Current  treatment: . Simvastatin 258m-Medications previously tried: none noted  -Denies myalgias -Educated on Cholesterol goals;  Benefits of statin for ASCVD risk reduction; -Recommended to continue current medication  Depression/Anxiety (Goal: Minimize symptoms) -Uncontrolled -Current treatment: . Alprazolam 0.2571m-2 tablets hs -Medications previously tried/failed: none noted -Patient mentions she has had increased anxiety over recent health diagnoses.  She feels much better after the surgery is over.   -She is very happy with her care from the cancer center and has access to chaplain and counselor through them to help her. -She [facility] for symptom control -Recommended to continue current medication  Breast Cancer -Controlled -Current treatment  . Tramadol 83m14mh prn . APAP 500mg62m -Medications previously tried: none noted -She has upcoming appt with cancer center this week and may be prescribed a new medication.  -She recently had a successful lumpectomy and they removed all cancerous cells and tissue.  Patient is very relieved by this. -She has not had to use tramadol in about a week but does take Tylenol 500mg 22mfor pain or discomfort. -Recommend continue current medications.  UPDATE 10/03/20 - Patient called and requested help with newly prescribed tamoxifen copay.  Sent her text message with picture of GoodRx coupon to cut her copay in half.  She should be abe to get it at [facility] $17-18 per month.  GERD (Goal: Minimize symptoms) -Controlled -Current treatment  . Famotidine 20mg b29mMedications previously tried: Pantoprazole -Patient reports no longer taking Protonix.  Now takes famotidine 20mg bi57mth no issues with reflux symptoms. -Recommended to continue current medication  Hypothyroidism? -Not ideally controlled -Current treatment   . None -Medications previously tried: none noted -Patient mentions ongoing fatigue. -last TSH in October was elevated and patient has not rechecked. -Encouraged patient to schedule an appointment soon to follow up on this. -Recommended new TSH.  Treat if it remains elevated.  Patient Goals/Self-Care Activities . Over the next 90 days, patient will:  - take medications as prescribed check blood pressure daily, document, and provide at future appointments target a minimum of 150 minutes of moderate intensity exercise weekly Report back to PCP for updated labwork especially for TSH follow up.  Follow Up Plan: The care management team will reach out to the patient again over the next 90 days.       Medication Assistance: None required.  Patient affirms current coverage meets needs.  Patient's preferred pharmacy is:  [id number] - [place], [place] - [address] AT [address] [place] [address] Phone: [phone] Fax: [phone]  Uses pill box? No - keeps them in cabinet Pt endorses 100% compliance  We discussed: Benefits of medication synchronization, packaging and delivery as well as enhanced pharmacist oversight with [employer]. Patient decided to: Continue current medication management strategy  Care Plan and Follow Up Patient Decision:  Patient agrees to Care Plan and Follow-up.  Plan: The care management team will reach out to the patient again over the next 90 days.  Christian Davis, PharmD Clinical Pharmacist [facility] [phone]  

## 2020-10-03 NOTE — Patient Instructions (Addendum)
Visit Information  Goals Addressed   None    Patient Care Plan: General Pharmacy (Adult)    Problem Identified: HTN, GERD, Dyslipidemia, Anxiety/Depression, Breast Cancer   Priority: High  Onset Date: 09/26/2020    Long-Range Goal: Patient-Specific Goal   Start Date: 09/26/2020  Expected End Date: 03/29/2021  Recent Progress: On track  Priority: High  Note:   Current Barriers:  . Current pharmacy has closed down so patiet needs her prescriptions changed to a new pharmacy.  Pharmacist Clinical Goal(s):  Marland Kitchen Over the next 90 days, patient will maintain control of blood pressure as evidenced by home monitoring  . adhere to prescribed medication regimen as evidenced by fill dates . contact provider office for questions/concerns as evidenced notation of same in electronic health record through collaboration with PharmD and provider.   Interventions: . 1:1 collaboration with Colon Branch, MD regarding development and update of comprehensive plan of care as evidenced by provider attestation and co-signature . Inter-disciplinary care team collaboration (see longitudinal plan of care) . Comprehensive medication review performed; medication list updated in electronic medical record  Hypertension (BP goal <140/90) -Controlled -Current treatment: . Amlodipine 2m daily -Medications previously tried: none noted  -Current home readings: 128/70s, patient recalled from memory -Current exercise habits: Patient walking when weather is warm, mentions sometimes get SOB or gets "wobbly".  Recently had COVID-19 and was diagnosed with breast cancer. -Denies hypotensive/hypertensive symptoms -Educated on BP goals and benefits of medications for prevention of heart attack, stroke and kidney damage; Importance of home blood pressure monitoring; -Counseled to monitor BP at home a few times per week, document, and provide log at future appointments -Recommended to continue current medication  Hyperlipidemia:  (LDL goal < 100) -Controlled -Current treatment: . Simvastatin 28m-Medications previously tried: none noted  -Denies myalgias -Educated on Cholesterol goals;  Benefits of statin for ASCVD risk reduction; -Recommended to continue current medication  Depression/Anxiety (Goal: Minimize symptoms) -Uncontrolled -Current treatment: . Alprazolam 0.2560m-2 tablets hs -Medications previously tried/failed: none noted -Patient mentions she has had increased anxiety over recent health diagnoses.  She feels much better after the surgery is over.   -She is very happy with her care from the cancer center and has access to chaplain and counselor through them to help her. -She requests [facility] for symptom control -Recommended to continue current medication  Breast Cancer -Controlled -Current treatment  . Tramadol 25m79mh prn . APAP 500mg55m -Medications previously tried: none noted -She has upcoming appt with cancer center this week and may be prescribed a new medication.  -She recently had a successful lumpectomy and they removed all cancerous cells and tissue.  Patient is very relieved by this. -She has not had to use tramadol in about a week but does take Tylenol 500mg 71mfor pain or discomfort. -Recommend continue current medications.  UPDATE 10/03/20 - Patient called and requested help with newly prescribed tamoxifen copay.  Sent her text message with picture of GoodRx coupon to cut her copay in half.  She should be abe to get it at [facility] $17-18 per month.  GERD (Goal: Minimize symptoms) -Controlled -Current treatment  . Famotidine 20mg b54mMedications previously tried: Pantoprazole -Patient reports no longer taking Protonix.  Now takes famotidine 20mg bi61mth no issues with reflux symptoms. -Recommended to continue current medication  Hypothyroidism? -Not ideally  controlled -Current treatment  .  None -Medications previously tried: none noted -Patient mentions ongoing fatigue. -last TSH in October was elevated and patient has not rechecked. -Encouraged patient to schedule an appointment soon to follow up on this. -Recommended new TSH.  Treat if it remains elevated.     Patient Goals/Self-Care Activities . Over the next 90 days, patient will:  - take medications as prescribed check blood pressure daily, document, and provide at future appointments target a minimum of 150 minutes of moderate intensity exercise weekly Report back to PCP for updated labwork especially for TSH follow up.  Follow Up Plan: The care management team will reach out to the patient again over the next 90 days.       The patient verbalized understanding of instructions, educational materials, and care plan provided today and agreed to receive a mailed copy of patient instructions, educational materials, and care plan.    [name], [facility]  Breast Cancer, Female  Breast cancer is a malignant growth of tissue (tumor) in the breast. Unlike noncancerous (benign) tumors, malignant tumors are cancerous and can spread to other parts of the body. The two most common types of breast cancer start in the milk ducts (ductal carcinoma) or in the lobules where milk is made in the breast (lobular carcinoma). Breast cancer is one of the most common types of cancer in women. What are the causes? The exact cause of female breast cancer is unknown. What increases the risk? The following factors may make you more likely to develop this condition:  Being older than 77 years of age.  Race and ethnicity. Caucasian women generally have an increased risk, but African-American women are more likely to develop the disease before age 69.  Having a family history of breast cancer.  Having had breast cancer in the past.  Having certain noncancerous conditions of the breast, such as  dense breast tissue.  Having the BRCA1 and BRCA2 genes.  Having a history of radiation exposure.  Obesity.  Starting menopause after age 64.  Starting your menstrual periods before age 33.  Having never been pregnant or having your first child after age 45.  Having never breastfed.  Using hormone therapy after menopause.  Using birth control pills.  Drinking more than one alcoholic drink a day.  Exposure to the drug DES, which was given to pregnant women from the 1940s to the 1970s. What are the signs or symptoms? Symptoms of this condition include:  A painless lump or thickening in your breast.  Changes in the size or shape of your breast.  Breast skin changes, such as puckering or dimpling.  Nipple abnormalities, such as scaling, crustiness, redness, or pulling in (retraction).  Nipple discharge that is bloody or clear.   How is this diagnosed? This condition may be diagnosed by:  Taking your medical history and doing a physical exam. During the exam, your health care provider will feel the tissue around your breast and under your arms.  Taking a sample of nipple discharge. The sample will be examined under a microscope.  Performing imaging tests, such as breast X-rays (mammogram), breast ultrasound exams, or an MRI.  Taking a tissue sample (biopsy) from the breast. The sample will be examined under a microscope to look for cancer cells.  Taking a sample from the lymph nodes near the affected breast (sentinel node biopsy). Your cancer will be staged to determine its severity and extent. Staging is a careful attempt to find out the size of the tumor, whether the  cancer has spread, and if so, to what parts of the body. Staging also includes testing your tumor for certain receptors, such as estrogen, progesterone, and human epidermal growth factor receptor 2 (HER2). This will help your cancer care team decide on a treatment that will work best for you. You may need to  have more tests to determine the stage of your cancer. Stages include the following:  Stage 0--The tumor has not spread to other breast tissue.  Stage I--The cancer is only found in the breast or may be in the lymph nodes. The tumor may be up to  in (2 cm) wide.  Stage II--The cancer has spread to nearby lymph nodes. The tumor may be up to 2 in (5 cm) wide.  Stage III--The cancer has spread to more distant lymph nodes. The tumor may be larger than 2 in (5 cm) wide.  Stage IV--The cancer has spread to other parts of the body, such as the bones, brain, liver, or lungs.   How is this treated? Treatment for this condition depends on the type and stage of the breast cancer. It may be treated with:  Surgery. This may involve breast-conserving surgery (lumpectomy or partial mastectomy) in which only the part of the breast containing the cancer is removed. Some normal tissue surrounding this area may also be removed. In some cases, surgery may be done to remove the entire breast (mastectomy) and nipple. Lymph nodes may also be removed.  Radiation therapy, which uses high-energy rays to kill cancer cells.  Chemotherapy, which is the use of drugs to kill cancer cells.  Hormone therapy, which involves taking medicine to adjust the hormone levels in your body. You may take medicine to decrease your estrogen levels. This can help stop cancer cells from growing.  Targeted therapy, in which drugs are used to block the growth and spread of cancer cells. These drugs target a specific part of the cancer cell and usually cause fewer side effects than chemotherapy. Targeted therapy may be used alone or in combination with chemotherapy.  A combination of surgery, radiation, chemotherapy, or hormone therapy may be needed to treat breast cancer. Follow these instructions at home:  Take over-the-counter and prescription medicines only as told by your health care provider.  Eat a healthy diet. A healthy diet  includes lots of fruits and vegetables, low-fat dairy products, lean meats, and fiber. ? Make sure half your plate is filled with fruits or vegetables. ? Choose high-fiber foods such as whole-grain breads and cereals.  Consider joining a support group. This may help you learn to cope with the stress of having breast cancer.  Talk to your health care team about exercise and physical activity. The right exercise program can: ? Help prevent or reduce symptoms such as fatigue or depression. ? Improve overall health and survival rates.  Keep all follow-up visits as told by your health care provider. This is important. Where to find more information  American Cancer Society: www.cancer.[place]: www.cancer.gov Contact a health care provider if:  You have a sudden increase in pain.  You have any symptoms or changes that concern you.  You lose weight without trying.  You notice a new lump in either breast or under your arm.  You develop swelling in either arm or hand.  You have a fever.  You notice new fatigue or weakness. Get help right away if:  You have chest pain or trouble breathing.  You faint. Summary  Breast  cancer is a malignant growth of tissue (tumor) in the breast.  Your cancer will be staged to determine its severity and extent.  Treatment for this condition depends on the type and stage of the breast cancer. This information is not intended to replace advice given to you by your health care provider. Make sure you discuss any questions you have with your health care provider. Document Revised: 06/21/2017 Document Reviewed: 03/04/2017 Elsevier Patient Education  2021 [employer].

## 2020-10-04 ENCOUNTER — Telehealth: Payer: Self-pay | Admitting: Genetic Counselor

## 2020-10-04 NOTE — Telephone Encounter (Signed)
Attempted call to set up blood draw for genetic testing - no answer and unable to leave voicemail.

## 2020-10-05 ENCOUNTER — Ambulatory Visit (HOSPITAL_BASED_OUTPATIENT_CLINIC_OR_DEPARTMENT_OTHER)
Admission: RE | Admit: 2020-10-05 | Discharge: 2020-10-05 | Disposition: A | Discharge status: Home / Self Care | Payer: Medicare Other | Source: Ambulatory Visit | Attending: Internal Medicine | Admitting: Internal Medicine

## 2020-10-05 ENCOUNTER — Other Ambulatory Visit: Payer: Self-pay

## 2020-10-05 ENCOUNTER — Ambulatory Visit (INDEPENDENT_AMBULATORY_CARE_PROVIDER_SITE_OTHER): Payer: Medicare Other | Admitting: Internal Medicine

## 2020-10-05 ENCOUNTER — Encounter: Payer: Self-pay | Admitting: Internal Medicine

## 2020-10-05 VITALS — BP 126/82 | HR 108 | Temp 97.7°F | Resp 18 | Ht 61.0 in | Wt 174.4 lb

## 2020-10-05 DIAGNOSIS — M549 Dorsalgia, unspecified: Secondary | ICD-10-CM | POA: Diagnosis not present

## 2020-10-05 DIAGNOSIS — R0602 Shortness of breath: Secondary | ICD-10-CM

## 2020-10-05 DIAGNOSIS — N390 Urinary tract infection, site not specified: Secondary | ICD-10-CM | POA: Diagnosis not present

## 2020-10-05 DIAGNOSIS — R7989 Other specified abnormal findings of blood chemistry: Secondary | ICD-10-CM

## 2020-10-05 DIAGNOSIS — M7989 Other specified soft tissue disorders: Secondary | ICD-10-CM | POA: Diagnosis not present

## 2020-10-05 DIAGNOSIS — Z853 Personal history of malignant neoplasm of breast: Secondary | ICD-10-CM | POA: Diagnosis not present

## 2020-10-05 DIAGNOSIS — M545 Low back pain, unspecified: Secondary | ICD-10-CM | POA: Diagnosis not present

## 2020-10-05 DIAGNOSIS — R600 Localized edema: Secondary | ICD-10-CM | POA: Diagnosis not present

## 2020-10-05 DIAGNOSIS — C50919 Malignant neoplasm of unspecified site of unspecified female breast: Secondary | ICD-10-CM | POA: Diagnosis not present

## 2020-10-05 LAB — URINALYSIS, ROUTINE W REFLEX MICROSCOPIC
Ketones, ur: NEGATIVE
Nitrite: POSITIVE — AB
Specific Gravity, Urine: >=1.03 — AB (ref 1.000–1.030)
Urine Glucose: NEGATIVE
Urobilinogen, UA: 1 (ref 0.0–1.0)
pH: 5.5 (ref 5.0–8.0)

## 2020-10-05 LAB — CBC WITH DIFFERENTIAL/PLATELET
Basophils Absolute: 0.1 10*3/uL (ref 0.0–0.1)
Basophils Relative: 0.9 % (ref 0.0–3.0)
Eosinophils Absolute: 0.1 10*3/uL (ref 0.0–0.7)
Eosinophils Relative: 1.5 % (ref 0.0–5.0)
HCT: 46.5 % — ABNORMAL HIGH (ref 36.0–46.0)
Hemoglobin: 15.7 g/dL — ABNORMAL HIGH (ref 12.0–15.0)
Lymphocytes Relative: 40.3 % (ref 12.0–46.0)
Lymphs Abs: 3.8 10*3/uL (ref 0.7–4.0)
MCHC: 33.8 g/dL (ref 30.0–36.0)
MCV: 92.9 fl (ref 78.0–100.0)
Monocytes Absolute: 0.9 10*3/uL (ref 0.1–1.0)
Monocytes Relative: 9.7 % (ref 3.0–12.0)
Neutro Abs: 4.5 10*3/uL (ref 1.4–7.7)
Neutrophils Relative %: 47.6 % (ref 43.0–77.0)
Platelets: 141 10*3/uL — ABNORMAL LOW (ref 150.0–400.0)
RBC: 5 Mil/uL (ref 3.87–5.11)
RDW: 14.3 % (ref 11.5–15.5)
WBC: 9.4 10*3/uL (ref 4.0–10.5)

## 2020-10-05 LAB — BASIC METABOLIC PANEL
BUN: 11 mg/dL (ref 6–23)
CO2: 22 mEq/L (ref 19–32)
Calcium: 9.5 mg/dL (ref 8.4–10.5)
Chloride: 102 mEq/L (ref 96–112)
Creatinine, Ser: 0.67 mg/dL (ref 0.40–1.20)
GFR: 84.71 mL/min (ref >60.00)
Glucose, Bld: 135 mg/dL — ABNORMAL HIGH (ref 70–99)
Potassium: 3.9 mEq/L (ref 3.5–5.1)
Sodium: 138 mEq/L (ref 135–145)

## 2020-10-05 MED ORDER — SULFAMETHOXAZOLE-TRIMETHOPRIM 800-160 MG PO TABS: 1.0000 | ORAL_TABLET | Freq: Two times a day (BID) | ORAL | 0 refills | Status: DC

## 2020-10-05 MED ORDER — IOHEXOL 350 MG/ML SOLN
100.0000 mL | Freq: Once | INTRAVENOUS | Status: AC | PRN
  Administered 2020-10-05: 100 mL via INTRAVENOUS

## 2020-10-05 NOTE — Patient Instructions (Signed)
   GO TO THE LAB : Get the blood work and provide a urine sample   GO TO THE FRONT DESK, [place] back for a checkup in 4 weeks   STOP BY THE FIRST FLOOR:  get the XRs

## 2020-10-05 NOTE — Progress Notes (Unsigned)
Subjective:    Patient ID: [name], female    DOB: [birth year], 75 y.o.   MRN: [id number]  DOS:  10/05/2020 Type of visit - description: Acute visit, multiple symptoms and concerns.   Since the last office visit, had left breast lumpectomy 09/08/2020 for breast cancer, evaluated by oncology 09/30/2020.  Reports shortness of breath, tired, weakness:  All sxs started back in November when she was diagnosed with pneumonia, slightly worse when she was diagnosed with COVID 07-2020. Currently with no fever chills No chest pain Occasional lower extremity at the ankles. No palpitations Had cough and wheezing while with COVID but that is decreased.  Also low back pain for a while: Bilaterally, no radiation, steady, not worse with bending or twisting her torso. No lower extremity paresthesias. The pain is chronic but seem more marked for the last 2 weeks  Also, 2 weeks history of LUTS: Nocturia, some urgency and difficulty urinating. Again no fever chills.   No calf pain or swelling  Review of Systems See above   Past Medical History:  Diagnosis Date  . Anxiety and depression   . Arthritis   . Blood transfusion without reported diagnosis 1989   GB hemorrage   . Cataract    Bil/no surgery yet.  . Coccyx pain    Secondary to scar tissue from old pressure ulcer after back surgery, Dr. Brantley Stage  . Coronary artery disease   . Depression 2010   panic attacks and depression  . Dizziness    severe: MRI chronic isch. changes and mastoiditis- saw Dr.Mundy(2007)  STATES SHE STILL HAS EPISODES- ESPECIALLY IF SHE GETS UP TOO QUICKLY AFTER LYING DOWN  . Eczema   . Family history of breast cancer   . Family history of kidney cancer   . Family history of lung cancer   . Family history of melanoma   . Family history of stomach cancer   . Family history of throat cancer   . GERD (gastroesophageal reflux disease)   . Goiter   . Headache(784.0)   . Hyperlipidemia   . Hypertension    . Pain    PAIN LOWER BACK AND DOWN RIGHT LEG WITH NUMBNESS, TINGLING RT LEG AND FOOT--STENOSIS  . Partial tear of right rotator cuff    & labral tear  . S/P cardiac cath 2009   Revealing nonobstructive CAD w/ tubular, discrete 40% mid lesion in the circumflex; discrete 30% proximal lesion in the LAD; luminal irregularities, 35% mid lesion in RCA; and generic 40% mid lesion in the RCA. Medical treatment recommended.  . S/P cardiac cath 11/24/2014   Revealing nonobstructive CAD w/ tubular, discrete 40% mid lesion in the circumflex; discrete 30% proximal lesion in the LAD; luminal irregularities, 35% mid lesion in RCA; and generic 40% mid lesion in the RCA. Medical treatment recommended.  . Stroke [facility]) 2004   mild TIA    Past Surgical History:  Procedure Laterality Date  . ABDOMINAL HYSTERECTOMY     oophorectomy L only  . BREAST CYST ASPIRATION Left   . BREAST CYST EXCISION Left   . BREAST LUMPECTOMY WITH RADIOACTIVE SEED LOCALIZATION Left 09/08/2020   Procedure: LEFT BREAST LUMPECTOMY WITH RADIOACTIVE SEED LOCALIZATION;  Surgeon: Stark Klein, MD;  Location: [facility];  Service: General;  Laterality: Left;  . BUNIONECTOMY  1990s   LEFT  . CHOLECYSTECTOMY    . HAND SURGERY  1990s   R hand x 2, L hand x 1  . LUMBAR LAMINECTOMY/DECOMPRESSION MICRODISCECTOMY  N/A 11/13/2012   Procedure: MICRO LUMBAR DECOMPRESSION L4 - L5 AND L2 - L3 2 LEVELS;  Surgeon: Johnn Hai, MD;  Location: [facility];  Service: Orthopedics;  Laterality: N/A;  . SHOULDER SURGERY  2006   left     Allergies as of 10/05/2020      Reactions   Codeine Other (See Comments)     chest and stomach pain, severe abd cramping   Hydrocodone    Whelts all over/swelling in lips   Sertraline Hcl Other (See Comments)    muscle aches, diarrhea, nausea      Medication List       Accurate as of October 05, 2020 11:25 AM. If you have any questions, ask your nurse or doctor.        acetaminophen 500 MG  tablet Commonly known as: TYLENOL Take 500 mg by mouth every 6 (six) hours as needed.   ALPRAZolam 0.25 MG tablet Commonly known as: XANAX Take 1-2 tablets (0.25-0.5 mg total) by mouth at bedtime as needed for anxiety or sleep.   amLODipine 10 MG tablet Commonly known as: NORVASC Take 1 tablet (10 mg total) by mouth daily.   aspirin 81 MG chewable tablet Chew by mouth daily.   calcium-vitamin D 250-100 MG-UNIT tablet Take 1 tablet by mouth 2 (two) times daily.   famotidine 20 MG tablet Commonly known as: PEPCID Take 20 mg by mouth 2 (two) times daily.   simvastatin 20 MG tablet Commonly known as: ZOCOR Take 1 tablet (20 mg total) by mouth at bedtime.   tamoxifen 20 MG tablet Commonly known as: NOLVADEX Take 1 tablet (20 mg total) by mouth daily.   traMADol 50 MG tablet Commonly known as: ULTRAM Take 1 tablet (50 mg total) by mouth every 6 (six) hours as needed for moderate pain or severe pain.          Objective:   Physical Exam BP 126/82 (BP Location: Right Arm, Patient Position: Sitting, Cuff Size: Small)   Pulse (!) 108   Temp 97.7 F (36.5 C) (Oral)   Resp 18   Ht 5\' 1"  (1.549 m)   Wt 174 lb 6 oz (79.1 kg)   SpO2 96%   BMI 32.95 kg/m  General:   Well developed, NAD, BMI noted.  HEENT:  Normocephalic . Face symmetric, atraumatic Lungs:  Slightly decreased breath sounds? Normal respiratory effort, no intercostal retractions, no accessory muscle use. Heart: RRR,  no murmur.  Abdomen:  Not distended, soft, non-tender. No rebound or rigidity.   Skin: Not pale. Not jaundice Lower extremities: no pretibial edema bilaterally ; calves soft, no tender. L calf one is slt larger in diameter (~ 3/4 inch) Neurologic:  alert & oriented X3.  Speech normal, gait appropriate for age and unassisted Psych--  Cognition and judgment appear intact.  Cooperative with normal attention span and concentration.  Behavior appropriate. No anxious or depressed appearing.      Assessment      Assessment  Prediabetes HTN Hyperlipidemia Anxiety depression, insomnia- xanax rx by pcp GERD CV: TIA? (2004) CAD: catheterization 2009 and 2016: Non-obstructive CAD, 40% blockages, see report. rx medical treatment Goiter: Last ultrasound 12-2014, nodules decrease in size MSK:  --Back - coccyxt pain, lumbar surgery 2014, chronic residual pain --On prn celebrex --on Tramadol (rx by back surgeon)   Vit d def Chronic Dizziness Eczema/psoriasis , sees derm covid infex 08/20/20 Breast cancer 07/26/2020.  Lumpectomy 09/08/2020, Rx tamoxifen  PLAN: Complicated medical history over the last  few months, here with multiple symptoms:  Shortness of breath, weakness Symptoms started about 06/10/2020 after she was diagnosed with pneumonia, subsequently dx w/  COVID-19 08/20/2020. 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 Today, she is afebrile, slightly tachycardic, other vital signs are stable, in no distress. Her follow-up chest x-ray for pneumonia 12-30-21still showed a RUL opacity, I recommended a chest CT but in the meantime she was diagnosed with breast cancer and that was never pursue. Repeat EKG: EKG not working today. Plan:  Repeat chest x-ray, BMP, CBC, D-dimer ; further advise w/ results (chest CT angio?  [place] left leg?) Keep appointment with pulmonary for 722 Back pain: It is really a chronic issue, more noticeable lately, check a x-ray. LUTS: UA urine culture, further advised with results Breast cancer: Recently diagnosed, has not restarted tamoxifen just yet  Addendum: D-dimer slightly positive, UA consistent with a UTI other tests are very good, plan: CT angio chest rule out PE, this will help evaluate her abnormal chest x-ray as well. [place] left leg rule out DVT We will send Bactrim DS x14 All of the above was discussed personally discussed with the patient. RTC 1 month   Time spent 45 minutes  This visit occurred during the SARS-CoV-2 public health emergency.   Safety protocols were in place, including screening questions prior to the visit, additional usage of staff PPE, and extensive cleaning of exam room while observing appropriate contact time as indicated for disinfecting solutions.

## 2020-10-06 ENCOUNTER — Other Ambulatory Visit: Payer: Medicare Other

## 2020-10-06 ENCOUNTER — Telehealth: Payer: Self-pay | Admitting: Internal Medicine

## 2020-10-06 ENCOUNTER — Encounter: Payer: Medicare Other | Admitting: Genetic Counselor

## 2020-10-06 NOTE — Telephone Encounter (Signed)
Called pt and advised of results, she will keep for apt with pulmonology. -JMA

## 2020-10-06 NOTE — Telephone Encounter (Signed)
Advise patient: Blood work was okay Ultrasound leg: No clot CT chest: No clot, some scarring tissue at the upper lung seen on both sides, may need a repeat CAT scan in the future. Recommend to keep the appointment she has with pulmonology. If difficulty breathing increases or not gradually better please let me know.

## 2020-10-06 NOTE — Assessment & Plan Note (Signed)
Complicated medical history over the last few months, here with multiple symptoms:  Shortness of breath, weakness Symptoms started about 06/10/2020 after she was diagnosed with pneumonia, subsequently dx w/  COVID-19 08/20/2020. 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 Today, she is afebrile, slightly tachycardic, other vital signs are stable, in no distress. Her follow-up chest x-ray for pneumonia 12-30-21still showed a RUL opacity, I recommended a chest CT but in the meantime she was diagnosed with breast cancer and that was never pursue. Repeat EKG: EKG not working today. Plan:  Repeat chest x-ray, BMP, CBC, D-dimer ; further advise w/ results (chest CT angio?  [place] left leg?) Keep appointment with pulmonary for 722 Back pain: It is really a chronic issue, more noticeable lately, check a x-ray. LUTS: UA urine culture, further advised with results Breast cancer: Recently diagnosed, has not restarted tamoxifen just yet  Addendum: D-dimer slightly positive, UA consistent with a UTI other tests are very good, plan: CT angio chest rule out PE, this will help evaluate her abnormal chest x-ray as well. [place] left leg rule out DVT We will send Bactrim DS x14 All of the above was discussed personally discussed with the patient. RTC 1 month

## 2020-10-07 LAB — URINE CULTURE
MICRO NUMBER:: 11654384
SPECIMEN QUALITY:: ADEQUATE

## 2020-10-07 LAB — D-DIMER, QUANTITATIVE: D-Dimer, Quant: 0.5 mcg/mL FEU — ABNORMAL HIGH (ref <0.50)

## 2020-10-11 NOTE — Telephone Encounter (Signed)
Scheduled a blood draw for genetic testing on 12/29/20 at 12:00 pm.

## 2020-10-18 ENCOUNTER — Other Ambulatory Visit: Payer: Self-pay | Admitting: Internal Medicine

## 2020-10-26 ENCOUNTER — Other Ambulatory Visit: Payer: Self-pay

## 2020-10-26 ENCOUNTER — Encounter: Payer: Self-pay | Admitting: Internal Medicine

## 2020-10-26 ENCOUNTER — Ambulatory Visit (INDEPENDENT_AMBULATORY_CARE_PROVIDER_SITE_OTHER): Payer: Medicare Other | Admitting: Internal Medicine

## 2020-10-26 VITALS — BP 142/90 | HR 90 | Temp 98.0°F | Resp 16 | Ht 61.0 in | Wt 171.5 lb

## 2020-10-26 DIAGNOSIS — I700 Atherosclerosis of aorta: Secondary | ICD-10-CM | POA: Diagnosis not present

## 2020-10-26 DIAGNOSIS — R0602 Shortness of breath: Secondary | ICD-10-CM

## 2020-10-26 DIAGNOSIS — I10 Essential (primary) hypertension: Secondary | ICD-10-CM | POA: Diagnosis not present

## 2020-10-26 DIAGNOSIS — J439 Emphysema, unspecified: Secondary | ICD-10-CM

## 2020-10-26 NOTE — Progress Notes (Signed)
Subjective:    Patient ID: [name], female    DOB: August 09, 1945, 75 y.o.   MRN: [id number]  DOS:  10/26/2020 Type of visit - description: Follow-up from last visit  Since then, she continue with DOE when she goes grocery shopping.  Sometimes she feels like she is going to pass out. Again denies chest pain, lower extremity edema or palpitations.  Since the last visit the cough has decreased to a minimum.  No wheezing  LUTS: Decreased.   Review of Systems No fever chills  Past Medical History:  Diagnosis Date  . Anxiety and depression   . Arthritis   . Blood transfusion without reported diagnosis 1989   GB hemorrage   . Cataract    Bil/no surgery yet.  . Coccyx pain    Secondary to scar tissue from old pressure ulcer after back surgery, Dr. Brantley Stage  . Coronary artery disease   . Depression 2010   panic attacks and depression  . Dizziness    severe: MRI chronic isch. changes and mastoiditis- saw Dr.Mundy(2007)  STATES SHE STILL HAS EPISODES- ESPECIALLY IF SHE GETS UP TOO QUICKLY AFTER LYING DOWN  . Eczema   . Family history of breast cancer   . Family history of kidney cancer   . Family history of lung cancer   . Family history of melanoma   . Family history of stomach cancer   . Family history of throat cancer   . GERD (gastroesophageal reflux disease)   . Goiter   . Headache(784.0)   . Hyperlipidemia   . Hypertension   . Pain    PAIN LOWER BACK AND DOWN RIGHT LEG WITH NUMBNESS, TINGLING RT LEG AND FOOT--STENOSIS  . Partial tear of right rotator cuff    & labral tear  . S/P cardiac cath 2009   Revealing nonobstructive CAD w/ tubular, discrete 40% mid lesion in the circumflex; discrete 30% proximal lesion in the LAD; luminal irregularities, 35% mid lesion in RCA; and generic 40% mid lesion in the RCA. Medical treatment recommended.  . S/P cardiac cath 11/24/2014   Revealing nonobstructive CAD w/ tubular, discrete 40% mid lesion in the circumflex; discrete 30%  proximal lesion in the LAD; luminal irregularities, 35% mid lesion in RCA; and generic 40% mid lesion in the RCA. Medical treatment recommended.  . Stroke [facility]) 2004   mild TIA    Past Surgical History:  Procedure Laterality Date  . ABDOMINAL HYSTERECTOMY     oophorectomy L only  . BREAST CYST ASPIRATION Left   . BREAST CYST EXCISION Left   . BREAST LUMPECTOMY WITH RADIOACTIVE SEED LOCALIZATION Left 09/08/2020   Procedure: LEFT BREAST LUMPECTOMY WITH RADIOACTIVE SEED LOCALIZATION;  Surgeon: Stark Klein, MD;  Location: [facility];  Service: General;  Laterality: Left;  . BUNIONECTOMY  1990s   LEFT  . CHOLECYSTECTOMY    . HAND SURGERY  1990s   R hand x 2, L hand x 1  . LUMBAR LAMINECTOMY/DECOMPRESSION MICRODISCECTOMY N/A 11/13/2012   Procedure: MICRO LUMBAR DECOMPRESSION L4 - L5 AND L2 - L3 2 LEVELS;  Surgeon: Johnn Hai, MD;  Location: [facility];  Service: Orthopedics;  Laterality: N/A;  . SHOULDER SURGERY  2006   left     Allergies as of 10/26/2020      Reactions   Codeine Other (See Comments)     chest and stomach pain, severe abd cramping   Hydrocodone    Whelts all over/swelling in lips   Sertraline  Hcl Other (See Comments)    muscle aches, diarrhea, nausea      Medication List       Accurate as of October 26, 2020  9:54 AM. If you have any questions, ask your nurse or doctor.        acetaminophen 500 MG tablet Commonly known as: TYLENOL Take 500 mg by mouth every 6 (six) hours as needed.   ALPRAZolam 0.25 MG tablet Commonly known as: XANAX Take 1-2 tablets (0.25-0.5 mg total) by mouth at bedtime as needed for anxiety or sleep.   amLODipine 10 MG tablet Commonly known as: NORVASC Take 1 tablet (10 mg total) by mouth daily.   aspirin 81 MG chewable tablet Chew by mouth daily.   calcium-vitamin D 250-100 MG-UNIT tablet Take 1 tablet by mouth 2 (two) times daily.   famotidine 20 MG tablet Commonly known as: PEPCID Take 20 mg by mouth 2 (two)  times daily.   simvastatin 20 MG tablet Commonly known as: ZOCOR Take 1 tablet (20 mg total) by mouth at bedtime.   sulfamethoxazole-trimethoprim 800-160 MG tablet Commonly known as: BACTRIM DS Take 1 tablet by mouth 2 (two) times daily.   tamoxifen 20 MG tablet Commonly known as: NOLVADEX Take 1 tablet (20 mg total) by mouth daily.          Objective:   Physical Exam BP (!) 142/90 (BP Location: Left Arm, Patient Position: Sitting, Cuff Size: Small)   Pulse 90   Temp 98 F (36.7 C) (Oral)   Resp 16   Ht 5\' 1"  (1.549 m)   Wt 171 lb 8 oz (77.8 kg)   SpO2 96%   BMI 32.40 kg/m  General:   Well developed, NAD, BMI noted. HEENT:  Normocephalic . Face symmetric, atraumatic Lungs:  CTA B Normal respiratory effort, no intercostal retractions, no accessory muscle use. Heart: RRR,  no murmur.  Lower extremities: no pretibial edema bilaterally  Skin: Not pale. Not jaundice Neurologic:  alert & oriented X3.  Speech normal, gait appropriate for age and unassisted Psych--  Cognition and judgment appear intact.  Cooperative with normal attention span and concentration.  Behavior appropriate. No anxious or depressed appearing.  In good spirits today    Assessment    Assessment  Prediabetes HTN Hyperlipidemia Anxiety depression, insomnia- xanax rx by pcp GERD CV: TIA? (2004) CAD: catheterization 2009 and 2016: Non-obstructive CAD, 40% blockages, see report. rx medical treatment Goiter: Last ultrasound 12-2014, nodules decrease in size MSK:  --Back - coccyxt pain, lumbar surgery 2014, chronic residual pain --On prn celebrex --on Tramadol (rx by back surgeon)   Vit d def Chronic Dizziness Eczema/psoriasis , sees derm covid infex 08/20/20 Breast cancer 07/26/2020.  Lumpectomy 09/08/2020, Rx tamoxifen  PLAN: Follow-up from last visit Shortness of breath, weakness At the last visit, D-dimer was slightly +, [place] neg DVT, CT chest: No PE, + scarring, fibrosis.  Nonspecific  right paratracheal lymph nodes, likely benign.   Still DOE, EKG today: LBBB, sinus rhythm.  Similar to EKG from 09/05/2020. Patient will see pulmonary tomorrow, if they do not feel symptoms are pulmonary related will consider cardiac eval although sxs are likely multifactorial.. UTI: Urine culture at the last visit was positive, had abx, LUTS decreased Back pain: See last visit, L spine x-ray showed DJD Anxiety: On Xanax only, declines any to further medication, contract signed, she seems in good spirits today. Aortic Atherosclerosis (ICD10-I70.0): Per CT.  Plan is to control CV RF Emphysema (ICD10-J43.9).  Per CT.  Has S OB, to see pulmonary tomorrow Breast cancer: On tamoxifen for 2 weeks.  Seems to tolerate well. HTN: BP slightly elevated today, recommend to monitor at home RTC 3 months    This visit occurred during the SARS-CoV-2 public health emergency.  Safety protocols were in place, including screening questions prior to the visit, additional usage of staff PPE, and extensive cleaning of exam room while observing appropriate contact time as indicated for disinfecting solutions.

## 2020-10-26 NOTE — Assessment & Plan Note (Signed)
Follow-up from last visit Shortness of breath, weakness At the last visit, D-dimer was slightly +, [place] neg DVT, CT chest: No PE, + scarring, fibrosis.  Nonspecific right paratracheal lymph nodes, likely benign.   Still DOE, EKG today: LBBB, sinus rhythm.  Similar to EKG from 09/05/2020. Patient will see pulmonary tomorrow, if they do not feel symptoms are pulmonary related will consider cardiac eval although sxs are likely multifactorial.. UTI: Urine culture at the last visit was positive, had abx, LUTS decreased Back pain: See last visit, L spine x-ray showed DJD Anxiety: On Xanax only, declines any to further medication, contract signed, she seems in good spirits today. Aortic Atherosclerosis (ICD10-I70.0): Per CT.  Plan is to control CV RF Emphysema (ICD10-J43.9).  Per CT.  Has S OB, to see pulmonary tomorrow Breast cancer: On tamoxifen for 2 weeks.  Seems to tolerate well. HTN: BP slightly elevated today, recommend to monitor at home RTC 3 months

## 2020-10-26 NOTE — Patient Instructions (Addendum)
Check the  blood pressure regularly BP GOAL is between 110/65 and  135/85. If it is consistently higher or lower, let me know    GO TO THE FRONT DESK, [place] back for a checkup in 3 months

## 2020-10-27 ENCOUNTER — Ambulatory Visit: Payer: Medicare Other | Admitting: Pulmonary Disease

## 2020-10-27 ENCOUNTER — Encounter: Payer: Self-pay | Admitting: Pulmonary Disease

## 2020-10-27 VITALS — BP 120/74 | HR 106 | Temp 97.6°F | Ht 61.5 in | Wt 170.6 lb

## 2020-10-27 DIAGNOSIS — R0609 Other forms of dyspnea: Secondary | ICD-10-CM

## 2020-10-27 DIAGNOSIS — R0600 Dyspnea, unspecified: Secondary | ICD-10-CM

## 2020-10-27 DIAGNOSIS — J984 Other disorders of lung: Secondary | ICD-10-CM

## 2020-10-27 LAB — SEDIMENTATION RATE: Sed Rate: 23 mm/hr (ref 0–30)

## 2020-10-27 LAB — C-REACTIVE PROTEIN: CRP: <1 mg/dL (ref 0.5–20.0)

## 2020-10-27 MED ORDER — BREO ELLIPTA 200-25 MCG/INH IN AEPB: 1.0000 | INHALATION_SPRAY | Freq: Every day | RESPIRATORY_TRACT | 11 refills | Status: DC

## 2020-10-27 NOTE — Patient Instructions (Addendum)
Is nice to meet you  I think some of your shortness of breath is related to the lungs.  The recent CT scan of your chest last month shows some scarring in the upper part of both lungs.  It sounds like you were ill in November 2021 we first saw this abnormality on chest x-ray.  I am concerned that the scar is related to infection likely worsened with Covid in January 2022.  I am sending lab work to evaluate other causes of the scarring on your lung that may need to be treated differently.   Use Breo 1 puff daily for shortness of breath.  Rinse your mouth out after every use.  Use this every day.  This is a treat potential asthma that can sometimes be triggered by Covid or other viral pneumonias.  If this does not help, we will not continue it more than a couple of months.  I have ordered a repeat special CT scan of your chest to be done in July 2022 to further evaluate the abnormality seen last month.  Return to clinic in July 2022 after CT high-res of chest, pulmonary function test same day of visit

## 2020-10-28 LAB — CYCLIC CITRUL PEPTIDE ANTIBODY, IGG: Cyclic Citrullin Peptide Ab: <16 UNITS

## 2020-10-28 LAB — RHEUMATOID FACTOR: Rheumatoid fact SerPl-aCnc: <14 IU/mL (ref <14)

## 2020-10-30 NOTE — Progress Notes (Signed)
@Patient  ID: [name], female    DOB: 06-Apr-1946, 75 y.o.   MRN: [id number]  Chief Complaint  Patient presents with  . Consult    SOB since 11/22. Covid PNA in December 2021. Was Dx with Breast Ca, had surgery 07/2020    Referring provider: Colon Branch, MD  HPI:   This is 75 year old whom we are seen in consultation for dyspnea on exertion and abnormal chest x-ray.  Multiple PCP notes reviewed.  Most recent oncology note reviewed.  Surgical oncology note reviewed.  Patient onset of viral symptoms around Thanksgiving 2022.  Cough, congestion, sore throat.  Prompted chest x-ray 06/13/2020 with bilateral interstitial infiltrates on my interpretation.  She was prescribed antibiotics.  Repeat chest x-ray 07/21/2020 with persistent slightly worse bilateral infiltrates on my interpretation.  Again prescribed antibiotics.  Had chest x-ray 10/05/2020 which showed persistent infiltrates slightly improved.  Notably, in the interim in 07/2018 she testifies for Covid.  She had D-dimer performed the same day the chest x-ray given these dyspnea symptoms it was positive.  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  Cough is improved.  Still with dyspnea exertion.  Described as severe.  Worse with inclines or stairs.  No times a day were better or worse.  No seasonal or environmental changes.  No positional changes.  No alleviating or exacerbating factors.  PMH: Anxiety, GERD, breast cancer status post lumpectomy on tamoxifen therapy Surgical history: Lumpectomy Family history: Mother with melanoma, colon cancer, Social history: Former smoker, approximate 20-pack-year history, quit 20+ years ago around [employer] / Pulmonary Flowsheets:   ACT:  No flowsheet data  found.  MMRC: No flowsheet data found.  Epworth:  No flowsheet data found.  Tests:   FENO:  No results found for: NITRICOXIDE  PFT: No flowsheet data found.  WALK:  No flowsheet data found.  Imaging: Reviewed as per EMR discussion this note DG Chest 2 View  Result Date: 10/06/2020 CLINICAL DATA:  75 year old female with history of shortness of breath. EXAM: CHEST - 2 VIEW COMPARISON:  Chest x-ray 07/21/2020. FINDINGS: Elevation of the right hemidiaphragm (chronic). Widespread interstitial prominence throughout the lungs bilaterally, similar to the prior study, most severe in the right upper lobe. No consolidative airspace disease. No pleural effusions. No pneumothorax. No evidence of pulmonary edema. Heart size is normal. Upper mediastinal contours are within normal limits. Atherosclerotic calcifications in the thoracic aorta. IMPRESSION: 1. The appearance of the chest is indicative of interstitial lung disease. Given the unusual distribution of the findings, this is presumably reflective of an alternative diagnosis to usual interstitial pneumonia (UIP) per current ATS guidelines, potentially chronic hypersensitivity pneumonitis. This could be better evaluated with follow-up nonemergent high-resolution chest CT if clinically appropriate. 2. Aortic atherosclerosis. Electronically Signed   By: Vinnie Langton M.D.   On: 10/06/2020 13:46   DG Lumbar Spine Complete  Result Date: 10/06/2020 CLINICAL DATA:  Low back pain for 2 weeks, previous back surgery EXAM: LUMBAR SPINE - COMPLETE 4+ VIEW COMPARISON:  11/13/2012 FINDINGS: Frontal, bilateral oblique, lateral views of the lumbar spine are obtained. There are 5 non-rib-bearing lumbar type vertebral bodies identified with left convex scoliosis centered at L2/L3. There are no acute displaced fractures. Progressive spondylosis at L2-3, L3-4, and L4-5. Mild facet hypertrophy from L3 through S1. Sacroiliac joints  are grossly normal. Diffuse  atherosclerosis throughout the aorta. IMPRESSION: 1. Progressive spondylosis and facet hypertrophy throughout the lower lumbar spine. 2. No acute bony abnormality. 3. Atherosclerosis. Electronically Signed   By: Randa Ngo M.D.   On: 10/06/2020 21:13   CT Angio Chest W/Cm &/Or Wo Cm  Result Date: 10/05/2020 CLINICAL DATA:  Left breast cancer status post recent lumpectomy, positive D-dimer, COVID-19 07/28/2020 EXAM: CT ANGIOGRAPHY CHEST WITH CONTRAST TECHNIQUE: Multidetector CT imaging of the chest was performed using the standard protocol during bolus administration of intravenous contrast. Multiplanar CT image reconstructions and MIPs were obtained to evaluate the vascular anatomy. CONTRAST:  134mL OMNIPAQUE IOHEXOL 350 MG/ML SOLN COMPARISON:  10/05/2020, 07/21/2020 FINDINGS: Cardiovascular: This is a technically adequate evaluation of the pulmonary vasculature. There are no filling defects or pulmonary emboli. The heart is unremarkable without pericardial effusion. No evidence of thoracic aortic aneurysm or dissection. Prominent atherosclerosis throughout the aorta and coronary vasculature. Mediastinum/Nodes: Subcentimeter mediastinal lymph nodes are seen, largest measuring up to 8 mm in the right paratracheal region. These are nonspecific. No pathologic adenopathy. Thyroid, trachea, and esophagus are unremarkable. Lungs/Pleura: Chronic areas of subpleural scarring and fibrosis are identified, greatest in the upper lobes. No acute airspace disease, effusion, or pneumothorax. Central airways are patent. Upper Abdomen: No acute abnormality. Musculoskeletal: There are no acute or destructive bony lesions. Postsurgical changes are seen in the left breast from recent lumpectomy for breast cancer. Postoperative seroma measuring 4.9 x 5.2 cm. Reconstructed images demonstrate no additional findings. Review of the MIP images confirms the above findings. IMPRESSION: 1. No evidence of pulmonary embolus. 2. Bilateral  subpleural scarring and fibrosis, greatest in the upper lobes. No acute airspace disease. 3. Postoperative changes from left lumpectomy, with postoperative seroma as above. 4. Nonspecific subcentimeter right paratracheal lymph nodes, likely benign. Given history of breast cancer, continued attention on follow-up may be useful. 5. Aortic Atherosclerosis (ICD10-I70.0) and Emphysema (ICD10-J43.9). Electronically Signed   By: Randa Ngo M.D.   On: 10/05/2020 19:44   US Venous Img Lower Unilateral Left  Result Date: 10/06/2020 CLINICAL DATA:  Left lower extremity edema. EXAM: LEFT LOWER EXTREMITY VENOUS DOPPLER ULTRASOUND TECHNIQUE: Gray-scale sonography with graded compression, as well as color Doppler and duplex ultrasound were performed to evaluate the lower extremity deep venous systems from the level of the common femoral vein and including the common femoral, femoral, profunda femoral, popliteal and calf veins including the posterior tibial, peroneal and gastrocnemius veins when visible. The superficial great saphenous vein was also interrogated. Spectral Doppler was utilized to evaluate flow at rest and with distal augmentation maneuvers in the common femoral, femoral and popliteal veins. COMPARISON:  None. FINDINGS: Contralateral Common Femoral Vein: Respiratory phasicity is normal and symmetric with the symptomatic side. No evidence of thrombus. Normal compressibility. Common Femoral Vein: No evidence of thrombus. Normal compressibility, respiratory phasicity and response to augmentation. Saphenofemoral Junction: No evidence of thrombus. Normal compressibility and flow on color Doppler imaging. Profunda Femoral Vein: No evidence of thrombus. Normal compressibility and flow on color Doppler imaging. Femoral Vein: No evidence of thrombus. Normal compressibility, respiratory phasicity and response to augmentation. Popliteal Vein: No evidence of thrombus. Normal compressibility, respiratory phasicity and  response to augmentation. Calf Veins: No evidence of thrombus. Normal compressibility and flow on color Doppler imaging. Superficial Great Saphenous Vein: No evidence of thrombus. Normal compressibility. Venous Reflux:  None. Other Findings: No evidence of superficial thrombophlebitis or abnormal fluid collection. IMPRESSION: No evidence of left lower extremity deep venous thrombosis.  Electronically Signed   By: Aletta Edouard M.D.   On: 10/06/2020 08:23    Lab Results: Reviewed and as per EMR CBC    Component Value Date/Time   WBC 9.4 10/05/2020 1206   RBC 5.00 10/05/2020 1206   HGB 15.7 (H) 10/05/2020 1206   HGB 16.3 (H) 08/10/2020 1243   HCT 46.5 (H) 10/05/2020 1206   PLT 141.0 (L) 10/05/2020 1206   PLT 121 (L) 08/10/2020 1243   MCV 92.9 10/05/2020 1206   MCH 30.8 08/10/2020 1243   MCHC 33.8 10/05/2020 1206   RDW 14.3 10/05/2020 1206   LYMPHSABS 3.8 10/05/2020 1206   MONOABS 0.9 10/05/2020 1206   EOSABS 0.1 10/05/2020 1206   BASOSABS 0.1 10/05/2020 1206    BMET    Component Value Date/Time   NA 138 10/05/2020 1206   K 3.9 10/05/2020 1206   CL 102 10/05/2020 1206   CO2 22 10/05/2020 1206   GLUCOSE 135 (H) 10/05/2020 1206   GLUCOSE 131 (H) 06/19/2006 1135   BUN 11 10/05/2020 1206   CREATININE 0.67 10/05/2020 1206   CREATININE 0.73 08/10/2020 1243   CREATININE 0.80 04/27/2020 1056   CALCIUM 9.5 10/05/2020 1206   GFRNONAA >60 08/10/2020 1243   GFRAA >90 11/21/2012 0045    BNP No results found for: BNP  ProBNP No results found for: PROBNP  Specialty Problems   None     Allergies  Allergen Reactions  . Codeine Other (See Comments)      chest and stomach pain, severe abd cramping  . Hydrocodone     Whelts all over/swelling in lips  . Sertraline Hcl Other (See Comments)     muscle aches, diarrhea, nausea    Immunization History  Administered Date(s) Administered  . Fluad Quad(high Dose 65+) 04/28/2019, 04/27/2020  . Influenza Whole 05/09/2007,  07/18/2009, 06/12/2010  . Influenza, High Dose Seasonal PF 06/01/2015, 05/23/2017, 05/08/2018  . Influenza, Seasonal, Injecte, Preservative Fre 08/01/2012  . Influenza,inj,Quad PF,6+ Mos 04/07/2014  . Pneumococcal Conjugate-13 12/03/2013  . Pneumococcal Polysaccharide-23 07/18/2009  . Tdap 12/20/2010  . Zoster 12/26/2011    Past Medical History:  Diagnosis Date  . Anxiety and depression   . Arthritis   . Blood transfusion without reported diagnosis 1989   GB hemorrage   . Cataract    Bil/no surgery yet.  . Coccyx pain    Secondary to scar tissue from old pressure ulcer after back surgery, Dr. Brantley Stage  . Coronary artery disease   . Depression 2010   panic attacks and depression  . Dizziness    severe: MRI chronic isch. changes and mastoiditis- saw Dr.Mundy(2007)  STATES SHE STILL HAS EPISODES- ESPECIALLY IF SHE GETS UP TOO QUICKLY AFTER LYING DOWN  . Eczema   . Family history of breast cancer   . Family history of kidney cancer   . Family history of lung cancer   . Family history of melanoma   . Family history of stomach cancer   . Family history of throat cancer   . GERD (gastroesophageal reflux disease)   . Goiter   . Headache(784.0)   . Hyperlipidemia   . Hypertension   . Pain    PAIN LOWER BACK AND DOWN RIGHT LEG WITH NUMBNESS, TINGLING RT LEG AND FOOT--STENOSIS  . Partial tear of right rotator cuff    & labral tear  . S/P cardiac cath 2009   Revealing nonobstructive CAD w/ tubular, discrete 40% mid lesion in the circumflex; discrete 30% proximal lesion in the  LAD; luminal irregularities, 35% mid lesion in RCA; and generic 40% mid lesion in the RCA. Medical treatment recommended.  . S/P cardiac cath 11/24/2014   Revealing nonobstructive CAD w/ tubular, discrete 40% mid lesion in the circumflex; discrete 30% proximal lesion in the LAD; luminal irregularities, 35% mid lesion in RCA; and generic 40% mid lesion in the RCA. Medical treatment recommended.  . Stroke ([place])  2004   mild TIA    Tobacco History: Social History   Tobacco Use  Smoking Status Former Smoker  . Packs/day: 1.00  . Years: 20.00  . Pack years: 20.00  . Quit date: 2000  . Years since quitting: 22.2  Smokeless Tobacco Never Used  Tobacco Comment   quit aprox 2000 after 30 years, 1 to 1.5 ppd   Counseling given: Yes Comment: quit aprox 2000 after 30 years, 1 to 1.5 ppd   Continue to not smoke  Outpatient Encounter Medications as of 10/27/2020  Medication Sig  . acetaminophen (TYLENOL) 500 MG tablet Take 500 mg by mouth every 6 (six) hours as needed.  . ALPRAZolam (XANAX) 0.25 MG tablet Take 1-2 tablets (0.25-0.5 mg total) by mouth at bedtime as needed for anxiety or sleep.  Marland Kitchen amLODipine (NORVASC) 10 MG tablet Take 1 tablet (10 mg total) by mouth daily.  Marland Kitchen aspirin 81 MG chewable tablet Chew by mouth daily.  . calcium-vitamin D 250-100 MG-UNIT tablet Take 1 tablet by mouth 2 (two) times daily.  . famotidine (PEPCID) 20 MG tablet Take 20 mg by mouth 2 (two) times daily.  . fluticasone furoate-vilanterol (BREO ELLIPTA) 200-25 MCG/INH AEPB Inhale 1 puff into the lungs daily.  . simvastatin (ZOCOR) 20 MG tablet Take 1 tablet (20 mg total) by mouth at bedtime.  . tamoxifen (NOLVADEX) 20 MG tablet Take 1 tablet (20 mg total) by mouth daily.   No facility-administered encounter medications on file as of 10/27/2020.     Review of Systems  Review of Systems  No chest pain of exertion.  No orthopnea or PND.  Conference review of systems otherwise negative. Physical Exam  BP 120/74 (BP Location: Left Arm)   Pulse (!) 106   Temp 97.6 F (36.4 C) (Oral)   Ht 5' 1.5" (1.562 m)   Wt 170 lb 9.6 oz (77.4 kg)   SpO2 95%   BMI 31.71 kg/m   Wt Readings from Last 5 Encounters:  10/27/20 170 lb 9.6 oz (77.4 kg)  10/26/20 171 lb 8 oz (77.8 kg)  10/05/20 174 lb 6 oz (79.1 kg)  09/30/20 175 lb 11.2 oz (79.7 kg)  09/08/20 175 lb 0.7 oz (79.4 kg)    BMI Readings from Last 5 Encounters:   10/27/20 31.71 kg/m  10/26/20 32.40 kg/m  10/05/20 32.95 kg/m  09/30/20 33.20 kg/m  09/08/20 33.07 kg/m     Physical Exam General: Well-appearing in no acute distress Eyes: EOMI, no icterus Neck: Supple no JVP Cardiovascular: Regular rhythm, no murmur Pulmonary: Clear rotation bilaterally no crackles Abdomen: Nondistended, bowel sounds present MSK: No synovitis, no joint effusion Neuro: No weakness, normal gait Psych: Normal mood, flat affect   Assessment & Plan:   Dyspnea on exertion: Suspect related to fibrosis seen on CT scan.  Trial of Breo to help symptoms.  Possible post viral reactive airways disease.  Will obtain PFTs 01/2021,  At that time about 6 months out from COVID-19 infection 07/2020.  Likely component of deconditioning as well.  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  No connective tissue diseases symptoms.  Will screen with serologic work-up.  Most likely irreversible.  Unlikely to worsen if related to viral process.  Still some time to hope for recovery or improvement given Covid + 07/2020.  Although I am suspicious that this initially started 05/2021 given abnormal chest x-ray and possible viral illness, possibly Covid at that time.   Return in about 3 months (around 01/26/2021).   Lanier Clam, MD 10/30/2020

## 2020-10-31 LAB — ANA+ENA+DNA/DS+SCL 70+SJOSSA/B
ANA Titer 1: NEGATIVE
ENA RNP Ab: <0.2 AI (ref 0.0–0.9)
ENA SM Ab Ser-aCnc: <0.2 AI (ref 0.0–0.9)
ENA SSA (RO) Ab: <0.2 AI (ref 0.0–0.9)
ENA SSB (LA) Ab: <0.2 AI (ref 0.0–0.9)
Scleroderma (Scl-70) (ENA) Antibody, IgG: <0.2 AI (ref 0.0–0.9)
dsDNA Ab: 1 IU/mL (ref 0–9)

## 2020-10-31 LAB — HYPERSENSITIVITY PNEUMONITIS
A. Pullulans Abs: NEGATIVE
A.Fumigatus #1 Abs: NEGATIVE
Micropolyspora faeni, IgG: NEGATIVE
Pigeon Serum Abs: NEGATIVE
Thermoact. Saccharii: NEGATIVE
Thermoactinomyces vulgaris, IgG: NEGATIVE

## 2020-11-28 ENCOUNTER — Other Ambulatory Visit: Payer: Self-pay | Admitting: Internal Medicine

## 2020-11-29 ENCOUNTER — Telehealth: Payer: Self-pay

## 2020-11-29 NOTE — Telephone Encounter (Signed)
Pt called this morning to see if she has missed an appt w/ PCP- appt hx reviewed- next visit is 01/25/21 at 10:40am. She received a notification from [facility] that they would not refill her famotidine until she is seen- I verified that famotidine was refilled 11/28/20 for #180 and 3 refills.

## 2020-11-30 NOTE — Telephone Encounter (Signed)
Pt called back today- she picked up the prescription at [facility]- says the bottle says promitidine but is unsure if that is correct because the letters are very small and dark. We verified the sig and dosage. Informed that it is famotidine- she is worried the actual tablet looks different than previous months- informed that [facility] may have had to change manufacturers if previous manufacturer had it on back order- informed that [facility] should have let her know of change and to be aware of that however.    Spoke w/ [facility]- verified that Pt did pick up famotidine earlier today.    Spoke w/ Pt- informed that she received correct medication. Pt verbalized understanding.

## 2020-12-29 ENCOUNTER — Inpatient Hospital Stay: Payer: Medicare Other

## 2020-12-29 ENCOUNTER — Other Ambulatory Visit: Payer: Self-pay

## 2020-12-29 ENCOUNTER — Inpatient Hospital Stay: Payer: Medicare Other | Attending: Hematology | Admitting: Nurse Practitioner

## 2020-12-29 VITALS — BP 114/74 | HR 97 | Temp 97.9°F | Resp 17 | Ht 61.5 in | Wt 165.7 lb

## 2020-12-29 DIAGNOSIS — F419 Anxiety disorder, unspecified: Secondary | ICD-10-CM | POA: Insufficient documentation

## 2020-12-29 DIAGNOSIS — Z8249 Family history of ischemic heart disease and other diseases of the circulatory system: Secondary | ICD-10-CM | POA: Insufficient documentation

## 2020-12-29 DIAGNOSIS — F32A Depression, unspecified: Secondary | ICD-10-CM | POA: Insufficient documentation

## 2020-12-29 DIAGNOSIS — I10 Essential (primary) hypertension: Secondary | ICD-10-CM | POA: Diagnosis not present

## 2020-12-29 DIAGNOSIS — Z79899 Other long term (current) drug therapy: Secondary | ICD-10-CM | POA: Insufficient documentation

## 2020-12-29 DIAGNOSIS — Z83518 Family history of other specified eye disorder: Secondary | ICD-10-CM | POA: Diagnosis not present

## 2020-12-29 DIAGNOSIS — Z800 Family history of malignant neoplasm of digestive organs: Secondary | ICD-10-CM | POA: Diagnosis not present

## 2020-12-29 DIAGNOSIS — Z8051 Family history of malignant neoplasm of kidney: Secondary | ICD-10-CM | POA: Insufficient documentation

## 2020-12-29 DIAGNOSIS — M8580 Other specified disorders of bone density and structure, unspecified site: Secondary | ICD-10-CM | POA: Diagnosis not present

## 2020-12-29 DIAGNOSIS — E785 Hyperlipidemia, unspecified: Secondary | ICD-10-CM | POA: Diagnosis not present

## 2020-12-29 DIAGNOSIS — Z885 Allergy status to narcotic agent status: Secondary | ICD-10-CM | POA: Diagnosis not present

## 2020-12-29 DIAGNOSIS — Z803 Family history of malignant neoplasm of breast: Secondary | ICD-10-CM | POA: Diagnosis not present

## 2020-12-29 DIAGNOSIS — U099 Post covid-19 condition, unspecified: Secondary | ICD-10-CM | POA: Diagnosis not present

## 2020-12-29 DIAGNOSIS — Z170 Estrogen receptor positive status [ER+]: Secondary | ICD-10-CM | POA: Diagnosis not present

## 2020-12-29 DIAGNOSIS — Z801 Family history of malignant neoplasm of trachea, bronchus and lung: Secondary | ICD-10-CM | POA: Diagnosis not present

## 2020-12-29 DIAGNOSIS — C50412 Malignant neoplasm of upper-outer quadrant of left female breast: Secondary | ICD-10-CM | POA: Insufficient documentation

## 2020-12-29 DIAGNOSIS — K219 Gastro-esophageal reflux disease without esophagitis: Secondary | ICD-10-CM | POA: Insufficient documentation

## 2020-12-29 DIAGNOSIS — Z8673 Personal history of transient ischemic attack (TIA), and cerebral infarction without residual deficits: Secondary | ICD-10-CM | POA: Diagnosis not present

## 2020-12-29 DIAGNOSIS — Z9049 Acquired absence of other specified parts of digestive tract: Secondary | ICD-10-CM | POA: Diagnosis not present

## 2020-12-29 DIAGNOSIS — Z808 Family history of malignant neoplasm of other organs or systems: Secondary | ICD-10-CM | POA: Insufficient documentation

## 2020-12-29 LAB — GENETIC SCREENING ORDER

## 2020-12-29 NOTE — Progress Notes (Signed)
CLINIC:  Survivorship   Patient Care Team: Colon Branch, MD as PCP - General [name], Nadara Mustard, MD as Consulting Physician (Gynecology) Ladene Artist, MD as Consulting Physician (Gastroenterology) Erroll Luna, MD as Consulting Physician (General Surgery) Pedro Earls, MD as Attending Physician (Orthopedic Surgery) Netta Cedars, MD as Consulting Physician (Orthopedic Surgery) Linda Hedges, DO as Consulting Physician (Obstetrics and Gynecology) Mauro Kaufmann, RN as Oncology Nurse Navigator Rockwell Germany, RN as Oncology Nurse Navigator Stark Klein, MD as Consulting Physician (General Surgery) Truitt Merle, MD as Consulting Physician (Hematology) Eppie Gibson, MD as Attending Physician (Radiation Oncology) Edythe Clarity, [facility] (Pharmacist) Edythe Clarity, [facility] (Pharmacist) Alla Feeling, NP as Nurse Practitioner (Nurse Practitioner)   REASON FOR VISIT:  Routine follow-up post-treatment for a recent history of breast cancer.  BRIEF ONCOLOGIC HISTORY:  Oncology History Overview Note  Cancer Staging Malignant neoplasm of upper-outer quadrant of left breast in female, estrogen receptor positive ([place]) Staging form: Breast, AJCC 8th Edition - Clinical stage from 07/26/2020: Stage IA (cT1b, cN0, cM0, G2, ER+, PR+, HER2-) - Signed by Truitt Merle, MD on 08/09/2020    Malignant neoplasm of upper-outer quadrant of left breast in female, estrogen receptor positive ([place])  07/04/2020 Mammogram   IMPRESSION: Indeterminate 4 x 8 x 5 mm irregular hypoechoic mass left breast 1 o'clock position 4 cm from the nipple.   07/26/2020 Cancer Staging   Staging form: Breast, AJCC 8th Edition - Clinical stage from 07/26/2020: Stage IA (cT1b, cN0, cM0, G2, ER+, PR+, HER2-) - Signed by Truitt Merle, MD on 08/09/2020    07/26/2020 Initial Biopsy   Diagnosis Breast, left, needle core biopsy, upper outer 1 o'clock - INVASIVE DUCTAL CARCINOMA - SEE COMMENT Microscopic Comment Based on the biopsy,  the carcinoma appears Nottingham grade 2 of 3 and measures 0.7 cm in greatest linear extent. Prognostic markers (ER/PR/ki-67/HER2) are pending and will be reported in an addendum. Dr. Jeannie Done reviewed the case and agrees with the above diagnosis. These results were called to [facility] on July 27, 2018.   07/26/2020 Receptors her2   PROGNOSTIC INDICATORS Results: IMMUNOHISTOCHEMICAL AND MORPHOMETRIC ANALYSIS PERFORMED MANUALLY The tumor cells are NEGATIVE for Her2 (1+). Estrogen Receptor: 90%, POSITIVE, STRONG STAINING INTENSITY Progesterone Receptor: 80%, POSITIVE, STRONG STAINING INTENSITY Proliferation Marker Ki67: 2%   08/03/2020 Initial Diagnosis   Malignant neoplasm of upper-outer quadrant of left breast in female, estrogen receptor positive ([place])    09/08/2020 Surgery   LEFT BREAST LUMPECTOMY WITH RADIOACTIVE SEED LOCALIZATION by Dr Barry Dienes   09/08/2020 Pathology Results   FINAL MICROSCOPIC DIAGNOSIS:   A. 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  - Margins not involved.  - Invasive carcinoma 0.1 cm from anterior margin.  - Biopsy site and biopsy clip.  - Fibrocystic changes.  - See oncology table.     09/2020 -  Anti-estrogen oral therapy   Tamoxifen 41m daily starting 09/2020   12/27/2020 Cancer Staging   Staging form: Breast, AJCC 8th Edition - Pathologic: Stage Unknown (pT1b, pNX, cM0, G1, ER+, PR+, HER2-) - Signed by BAlla Feeling NP on 12/27/2020  Histologic grading system: 3 grade system    12/29/2020 Survivorship   SCP delivered by LCira Rue NP      INTERVAL HISTORY:  [name] meeting to review her survivorship care plan detailing her treatment course for breast cancer, as well as monitoring long-term side effects  of that treatment, education regarding health maintenance, screening, and overall wellness and health promotion.     She has had a hard  time since last visit with long COVID symptoms and pneumonia.  She is following her PCP and pulmonology.  She feels better recently.  Tolerating tamoxifen well.  Her appetite fluctuates at baseline, she is losing a little weight.  She has severe anxiety with driving, has to have friends and family transport her.  Also feels a little short tempered since starting tamoxifen.  Denies depression.  Denies fatigue, bone pain, hot flashes, new breast lump/mass, nipple discharge or inversion, vaginal bleeding, or signs of thrombosis.    ONCOLOGY TREATMENT TEAM:  1. Surgeon:  Dr. Barry Dienes at [facility] 2. Medical Oncologist: Dr. Burr Medico  3. Radiation Oncologist: Dr. Isidore Moos    PAST MEDICAL/SURGICAL HISTORY:  Past Medical History:  Diagnosis Date   Anxiety and depression    Arthritis    Blood transfusion without reported diagnosis 1989   GB hemorrage    Cataract    Bil/no surgery yet.   Coccyx pain    Secondary to scar tissue from old pressure ulcer after back surgery, Dr. Brantley Stage   Coronary artery disease    Depression 2010   panic attacks and depression   Dizziness    severe: MRI chronic isch. changes and mastoiditis- saw Dr.Mundy(2007)  STATES SHE STILL HAS EPISODES- ESPECIALLY IF SHE GETS UP TOO QUICKLY AFTER LYING DOWN   Eczema    Family history of breast cancer    Family history of kidney cancer    Family history of lung cancer    Family history of melanoma    Family history of stomach cancer    Family history of throat cancer    GERD (gastroesophageal reflux disease)    Goiter    Headache(784.0)    Hyperlipidemia    Hypertension    Pain    PAIN LOWER BACK AND DOWN RIGHT LEG WITH NUMBNESS, TINGLING RT LEG AND FOOT--STENOSIS   Partial tear of right rotator cuff    & labral tear   S/P cardiac cath 2009   Revealing nonobstructive CAD w/ tubular, discrete 40% mid lesion in the circumflex; discrete 30% proximal lesion in the LAD; luminal irregularities, 35% mid lesion in  RCA; and generic 40% mid lesion in the RCA. Medical treatment recommended.   S/P cardiac cath 11/24/2014   Revealing nonobstructive CAD w/ tubular, discrete 40% mid lesion in the circumflex; discrete 30% proximal lesion in the LAD; luminal irregularities, 35% mid lesion in RCA; and generic 40% mid lesion in the RCA. Medical treatment recommended.   Stroke [facility]) 2004   mild TIA   Past Surgical History:  Procedure Laterality Date   ABDOMINAL HYSTERECTOMY     oophorectomy L only   BREAST CYST ASPIRATION Left    BREAST CYST EXCISION Left    BREAST LUMPECTOMY WITH RADIOACTIVE SEED LOCALIZATION Left 09/08/2020   Procedure: LEFT BREAST LUMPECTOMY WITH RADIOACTIVE SEED LOCALIZATION;  Surgeon: Stark Klein, MD;  Location: [place];  Service: General;  Laterality: Left;   BUNIONECTOMY  1990s   LEFT   CHOLECYSTECTOMY     HAND SURGERY  1990s   R hand x 2, L hand x 1   LUMBAR LAMINECTOMY/DECOMPRESSION MICRODISCECTOMY N/A 11/13/2012   Procedure: MICRO LUMBAR DECOMPRESSION L4 - L5 AND L2 - L3 2 LEVELS;  Surgeon: Johnn Hai, MD;  Location: [facility];  Service: Orthopedics;  Laterality: N/A;   SHOULDER SURGERY  2006   left      ALLERGIES:  Allergies  Allergen Reactions   Codeine Other (See Comments)      chest and stomach pain, severe abd cramping   Hydrocodone     Whelts all over/swelling in lips   Sertraline Hcl Other (See Comments)     muscle aches, diarrhea, nausea     CURRENT MEDICATIONS:  Outpatient Encounter Medications as of 12/29/2020  Medication Sig   acetaminophen (TYLENOL) 500 MG tablet Take 500 mg by mouth every 6 (six) hours as needed.   ALPRAZolam (XANAX) 0.25 MG tablet Take 1-2 tablets (0.25-0.5 mg total) by mouth at bedtime as needed for anxiety or sleep.   amLODipine (NORVASC) 10 MG tablet Take 1 tablet (10 mg total) by mouth daily.   aspirin 81 MG chewable tablet Chew by mouth daily.   calcium-vitamin D 250-100 MG-UNIT tablet Take 1 tablet by mouth 2  (two) times daily.   famotidine (PEPCID) 20 MG tablet Take 1 tablet (20 mg total) by mouth 2 (two) times daily.   fluticasone furoate-vilanterol (BREO ELLIPTA) 200-25 MCG/INH AEPB Inhale 1 puff into the lungs daily.   simvastatin (ZOCOR) 20 MG tablet Take 1 tablet (20 mg total) by mouth at bedtime.   tamoxifen (NOLVADEX) 20 MG tablet Take 1 tablet (20 mg total) by mouth daily.   No facility-administered encounter medications on file as of 12/29/2020.     ONCOLOGIC FAMILY HISTORY:  Family History  Problem Relation Age of Onset   Throat cancer Other        cousin   Heart attack Half-Brother        2 brother s, CABG   Heart disease Half-Brother    Lung cancer Mother        dx late 40s, cousin   Melanoma Mother        multiple, first diagnosed 25s   Lung cancer Brother    Throat cancer Brother 16   Cataracts Father    Heart disease Half-Brother    Kidney cancer Maternal Aunt 23   Cancer Maternal Grandfather        NOS, dx 41s   Stomach cancer Maternal Uncle    Breast cancer Cousin        dx >50, maternal first cousin   Breast cancer Cousin        dx >50, maternal first cousin   Colon cancer Neg Hx    Diabetes Neg Hx      GENETIC COUNSELING/TESTING: Drawn today, result is pending  SOCIAL HISTORY:  [name] is widowed and lives near friends and family in [place], [place]. She denies any current or history of tobacco, alcohol, or illicit drug use.     PHYSICAL EXAMINATION:  Vital Signs:   Vitals:   12/29/20 1231  BP: 114/74  Pulse: 97  Resp: 17  Temp: 97.9 F (36.6 C)  SpO2: 100%   Filed Weights   12/29/20 1231  Weight: 165 lb 11.2 oz (75.2 kg)   General: Well-nourished, well-appearing female in no acute distress.   HEENT: Sclerae anicteric. Oral mucosa is pink, moist.  Oropharynx is pink without lesions or erythema.  Lymph: No cervical, supraclavicular, or infraclavicular lymphadenopathy  Cardiovascular: Regular rate and  rhythm. Respiratory: Decreased breath sounds, breathing non-labored.  Neuro: No focal deficits. Steady gait.  Psych: Mood and affect normal and appropriate for situation.  Extremities: No edema. MSK: No focal spinal tenderness to palpation.  Full range of motion in bilateral upper extremities  Skin: Warm and dry. Breast exam s/p left lumpectomy, incisions completely healed.  No palpable mass in either breast or axilla that I could appreciate.  No bilateral nipple discharge or inversion.  LABORATORY DATA:  None for this visit.  DIAGNOSTIC IMAGING:  None for this visit.      ASSESSMENT AND PLAN:  Ms.. Copeland is a pleasant 75 y.o. female with Stage 1A left breast invasive ductal carcinoma, ER+/PR+/HER2-, diagnosed in 06/2020, treated with lumpectomy and adjuvant anti-estrogen therapy with tamoxifen beginning in 09/2020.  She presents to the Survivorship Clinic for our initial meeting and routine follow-up post-completion of treatment for breast cancer.    1. Stage 1A left breast cancer:  [name] has recovered well from definitive treatment for breast cancer. She will follow-up with her medical oncologist, Dr. Burr Medico in 03/2021 with history and physical exam per surveillance protocol.  She will continue her anti-estrogen therapy with tamoxifen.  Thus far, she is tolerating well, with minimal side effects. She was instructed to make Dr. Burr Medico or myself aware if she begins to experience any worsening side effects of the medication and I could see her back in clinic to help manage those side effects, as needed.  Breast exam is benign, no concern for recurrence.  Today, a comprehensive survivorship care plan and treatment summary was reviewed with the patient today detailing her breast cancer diagnosis, treatment course, potential late/long-term effects of treatment, appropriate follow-up care with recommendations for the future, and patient education resources.  A copy of this summary, along with a  letter will be sent to the patient's primary care provider via In Basket message after today's visit.    2.  Long COVID symptoms: Continue follow-up with pulmonology and PCP  3. Bone health:  Given [name]'s age/history of breast cancer and her osteopenia with T score -1.6 on 06/2019 DEXA, tamoxifen was selected for her due to its bone strengthening qualities.  Continue calcium and vitamin D and weight-bearing activities.  She was given education on specific activities to promote bone health.  Repeat DEXA 07/2021 with mammogram.  4. Cancer screening:  Due to [name]'s history and her age, she should receive screening for skin cancers. Last colonoscopy 2019, report says 5 year recall but patient states she has aged out. S/p hysterectomy. The information and recommendations are listed on the patient's comprehensive care plan/treatment summary and were reviewed in detail with the patient.    5. Health maintenance and wellness promotion: [name] was encouraged to consume 5-7 servings of fruits and vegetables per day. We reviewed the "Nutrition Rainbow" handout, as well as the handout "Take Control of Your Health and Reduce Your Cancer Risk" from the [employer].  She was also encouraged to engage in moderate to vigorous exercise for 30 minutes per day most days of the week. We discussed the [employer] fitness program, which is designed for cancer survivors to help them become more physically fit after cancer treatments.  She was instructed to limit her alcohol consumption and continue to abstain from tobacco use.   6. Support services/counseling: It is not uncommon for this period of the patient's cancer care trajectory to be one of many emotions and stressors.  We discussed an opportunity for her to participate in the next session of [facility] ("Finding Your New Normal") support group series designed for patients after they have completed treatment.   [name] was encouraged  to take advantage of our many other support services programs, support groups,  and/or counseling in coping with her new life as a cancer survivor after completing anti-cancer treatment.  She was offered support today through active listening and expressive supportive counseling.  She was given information regarding our available services and encouraged to contact me with any questions or for help enrolling in any of our support group/programs.    Dispo:   -Return to cancer center 03/2021 -Mammogram due in 07/2021 with DEXA -Follow up with surgery as scheduled -She is welcome to return back to the Survivorship Clinic at any time; no additional follow-up needed at this time.  -Consider referral back to survivorship as a long-term survivor for continued surveillance  A total of (30) minutes of face-to-face time was spent with this patient with greater than 50% of that time in counseling and care-coordination.   Cira Rue, NP Survivorship Program [facility] [phone]   Note: PRIMARY CARE PROVIDER Colon Branch, [place] [phone]

## 2021-01-14 ENCOUNTER — Other Ambulatory Visit: Payer: Self-pay | Admitting: Internal Medicine

## 2021-01-17 DIAGNOSIS — Z1379 Encounter for other screening for genetic and chromosomal anomalies: Secondary | ICD-10-CM | POA: Insufficient documentation

## 2021-01-19 ENCOUNTER — Telehealth: Payer: Self-pay | Admitting: Genetic Counselor

## 2021-01-19 ENCOUNTER — Encounter: Payer: Self-pay | Admitting: Genetic Counselor

## 2021-01-19 ENCOUNTER — Ambulatory Visit: Payer: Self-pay | Admitting: Genetic Counselor

## 2021-01-19 DIAGNOSIS — Z1379 Encounter for other screening for genetic and chromosomal anomalies: Secondary | ICD-10-CM

## 2021-01-19 NOTE — Progress Notes (Signed)
HPI:  [name] was previously seen in the [facility] due to a personal and family history of cancer and concerns regarding a hereditary predisposition to cancer. Please refer to our prior cancer genetics clinic note for more information regarding our discussion, assessment and recommendations, at the time. [name] recent genetic test results were disclosed to her, as were recommendations warranted by these results. These results and recommendations are discussed in more detail below.  CANCER HISTORY:  Oncology History Overview Note  Cancer Staging Malignant neoplasm of upper-outer quadrant of left breast in female, estrogen receptor positive ([place]) Staging form: Breast, AJCC 8th Edition - Clinical stage from 07/26/2020: Stage IA (cT1b, cN0, cM0, G2, ER+, PR+, HER2-) - Signed by Jenny Merle, MD on 08/09/2020    Malignant neoplasm of upper-outer quadrant of left breast in female, estrogen receptor positive ([place])  07/04/2020 Mammogram   IMPRESSION: Indeterminate 4 x 8 x 5 mm irregular hypoechoic mass left breast 1 o'clock position 4 cm from the nipple.   07/26/2020 Cancer Staging   Staging form: Breast, AJCC 8th Edition - Clinical stage from 07/26/2020: Stage IA (cT1b, cN0, cM0, G2, ER+, PR+, HER2-) - Signed by Jenny Merle, MD on 08/09/2020    07/26/2020 Initial Biopsy   Diagnosis Breast, left, needle core biopsy, upper outer 1 o'clock - INVASIVE DUCTAL CARCINOMA - SEE COMMENT Microscopic Comment Based on the biopsy, the carcinoma appears Nottingham grade 2 of 3 and measures 0.7 cm in greatest linear extent. Prognostic markers (ER/PR/ki-67/HER2) are pending and will be reported in an addendum. Dr. Jeannie Copeland reviewed the case and agrees with the above diagnosis. These results were called to [facility] on July 27, 2018.   07/26/2020 Receptors her2   PROGNOSTIC INDICATORS Results: IMMUNOHISTOCHEMICAL AND MORPHOMETRIC ANALYSIS PERFORMED  MANUALLY The tumor cells are NEGATIVE for Her2 (1+). Estrogen Receptor: 90%, POSITIVE, STRONG STAINING INTENSITY Progesterone Receptor: 80%, POSITIVE, STRONG STAINING INTENSITY Proliferation Marker Ki67: 2%   08/03/2020 Initial Diagnosis   Malignant neoplasm of upper-outer quadrant of left breast in female, estrogen receptor positive ([place])    09/08/2020 Surgery   LEFT BREAST LUMPECTOMY WITH RADIOACTIVE SEED LOCALIZATION by Dr [name]   09/08/2020 Pathology Results   FINAL MICROSCOPIC DIAGNOSIS:   A. 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  - Margins not involved.  - Invasive carcinoma 0.1 cm from anterior margin.  - Biopsy site and biopsy clip.  - Fibrocystic changes.  - See oncology table.     09/2020 -  Anti-estrogen oral therapy   Tamoxifen 71m daily starting 09/2020   12/27/2020 Cancer Staging   Staging form: Breast, AJCC 8th Edition - Pathologic: Stage Unknown (pT1b, pNX, cM0, G1, ER+, PR+, HER2-) - Signed by [name] on 12/27/2020  Histologic grading system: 3 grade system    12/29/2020 Survivorship   SCP delivered by Jenny Rue Copeland    01/17/2021 Genetic Testing   Negative genetic testing:  No pathogenic variants detected on the [facility] CancerNext-Expanded + RNAinsight panel. The report date is 01/17/2021.   The CancerNext-Expanded + RNAinsight gene panel offered by [employer] includes sequencing and rearrangement analysis for the following 77 genes: AIP, ALK, APC, ATM, AXIN2, BAP1, BARD1, BLM, BMPR1A, BRCA1, BRCA2, BRIP1, CDC73, CDH1, CDK4, CDKN1B, CDKN2A, CHEK2, CTNNA1, DICER1, FANCC, FH, FLCN, GALNT12, KIF1B, LZTR1, MAX, MEN1, MET, MLH1, MSH2, MSH3, MSH6, MUTYH, NBN, NF1, NF2, NTHL1, PALB2, PHOX2B, PMS2, POT1, PRKAR1A, PTCH1, PTEN, RAD51C, RAD51D, RB1, RECQL, RET, SDHA,  SDHAF2, SDHB, SDHC, SDHD, SMAD4, SMARCA4, SMARCB1, SMARCE1, STK11, SUFU, TMEM127, TP53, TSC1, TSC2, VHL and XRCC2 (sequencing and deletion/duplication); EGFR, EGLN1,  HOXB13, KIT, MITF, PDGFRA, POLD1 and POLE (sequencing only); EPCAM and GREM1 (deletion/duplication only). RNA data is routinely analyzed for use in variant interpretation for all genes.     FAMILY HISTORY:  We obtained a detailed, 4-generation family history.  Significant diagnoses are listed below: Family History  Problem Relation Age of Onset   Throat cancer Other        cousin   Heart attack Half-Brother        2 brother s, CABG   Heart disease Half-Brother    Lung cancer Mother        dx late 62s, cousin   Melanoma Mother        multiple, first diagnosed 97s   Lung cancer Brother    Throat cancer Brother 60   Cataracts Father    Heart disease Half-Brother    Kidney cancer Maternal Aunt 66   Cancer Maternal Grandfather        NOS, dx 50s   Stomach cancer Maternal Uncle    Breast cancer Cousin        dx >50, maternal first cousin   Breast cancer Cousin        dx >50, maternal first cousin   Colon cancer Neg Hx    Diabetes Neg Hx    [name] has two daughters and two sons (ages 37-60). She has one full-brother and three maternal half-brothers. Her full-brother died at age 15 from throat cancer (diagnosed age 29).   [name] mother died at age 64 and had a history of multiple melanomas first diagnosed in her 10s and lung cancer diagnosed age 45. [name] had one maternal aunt and four maternal uncles. One uncle died in his 46s from stomach cancer. Her aunt was diagnosed with kidney cancer at age 46. She has two maternal first cousins who were diagnosed with breast cancer older than 74. Her maternal grandmother died age 36 from a stroke, and her maternal grandfather died in his 56s from an unknown cancer.   [name] father is alive at age 74 and has not had cancer. She had multiple paternal aunts and uncles, although she does not have much information about these relatives. Her paternal grandparents died in their 49s without cancer.   [name] is  unaware of previous family history of genetic testing for hereditary cancer risks. Patient's ancestors are of unknown descent. There is no reported Ashkenazi Jewish ancestry. There is no known consanguinity.  GENETIC TEST RESULTS: Genetic testing reported out on 01/17/2021 through the [facility] CancerNext-Expanded + RNAinsight panel. No pathogenic variants were detected.   The CancerNext-Expanded + RNAinsight gene panel offered by [employer] and includes sequencing and rearrangement analysis for the following 77 genes: AIP, ALK, APC, ATM, AXIN2, BAP1, BARD1, BLM, BMPR1A, BRCA1, BRCA2, BRIP1, CDC73, CDH1, CDK4, CDKN1B, CDKN2A, CHEK2, CTNNA1, DICER1, FANCC, FH, FLCN, GALNT12, KIF1B, LZTR1, MAX, MEN1, MET, MLH1, MSH2, MSH3, MSH6, MUTYH, NBN, NF1, NF2, NTHL1, PALB2, PHOX2B, PMS2, POT1, PRKAR1A, PTCH1, PTEN, RAD51C, RAD51D, RB1, RECQL, RET, SDHA, SDHAF2, SDHB, SDHC, SDHD, SMAD4, SMARCA4, SMARCB1, SMARCE1, STK11, SUFU, TMEM127, TP53, TSC1, TSC2, VHL and XRCC2 (sequencing and deletion/duplication); EGFR, EGLN1, HOXB13, KIT, MITF, PDGFRA, POLD1 and POLE (sequencing only); EPCAM and GREM1 (deletion/duplication only). RNA data is routinely analyzed for use in variant interpretation for all genes. The test report will be scanned into EPIC and located under  the Molecular Pathology section of the Results Review tab.  A portion of the Copeland report is included below for reference.     We discussed with [name] that because current genetic testing is not perfect, it is possible there may be a gene mutation in one of these genes that current testing cannot detect, but that chance is small.  We also discussed that there could be another gene that has not yet been discovered, or that we have not yet tested, that is responsible for the cancer diagnoses in the family. It is also possible there is a hereditary cause for the cancer in the family that [name] did not inherit and therefore was not identified in her  testing.  Therefore, it is important to remain in touch with cancer genetics in the future so that we can continue to offer [name] the most up to date genetic testing.   CANCER SCREENING RECOMMENDATIONS: [name] is considered negative (normal).  This means that we have not identified a hereditary cause for her personal and family history of cancer at this time. While reassuring, this does not definitively rule out a hereditary predisposition to cancer. It is still possible that there could be genetic mutations that are undetectable by current technology. There could be genetic mutations in genes that have not been tested or identified to increase cancer risk.  Therefore, it is recommended she continue to follow the cancer management and screening guidelines provided by her oncology and primary healthcare provider.   An individual's cancer risk and medical management are not determined by genetic test results alone. Overall cancer risk assessment incorporates additional factors, including personal medical history, family history, and any available genetic information that may Copeland in a personalized plan for cancer prevention and surveillance.  RECOMMENDATIONS FOR FAMILY MEMBERS:  Individuals in this family might be at some increased risk of developing cancer, over the general population risk, simply due to the family history of cancer.  We recommended women in this family have a yearly mammogram beginning at age 57, or 35 years younger than the earliest onset of cancer, an annual clinical breast exam, and perform monthly breast self-exams. Women in this family should also have a gynecological exam as recommended by their primary provider. All family members should be referred for colonoscopy starting at age 6.  FOLLOW-UP: Lastly, we discussed with [name] that cancer genetics is a rapidly advancing field and it is possible that new genetic tests will be appropriate for her  and/or her family members in the future. We encouraged her to remain in contact with cancer genetics on an annual basis so we can update her personal and family histories and let her know of advances in cancer genetics that may benefit this family.   Our contact number was provided. [name] questions were answered to her satisfaction, and she knows she is welcome to call us at anytime with additional questions or concerns.   Clint Guy, MS, [facility] Genetic Counselor [email or web address] Phone: [phone]

## 2021-01-19 NOTE — Telephone Encounter (Signed)
Revealed negative genetic testing. Discussed that we do not know why she has breast cancer or why there is cancer in the family. It is possible that there could be a mutation in a different gene that we are not testing, or our current technology may not be able to detect certain mutations. It will therefore be important for her to stay in contact with genetics to keep up with whether additional testing may be appropriate in the future.

## 2021-01-20 ENCOUNTER — Ambulatory Visit (INDEPENDENT_AMBULATORY_CARE_PROVIDER_SITE_OTHER)
Admission: RE | Admit: 2021-01-20 | Discharge: 2021-01-20 | Disposition: A | Discharge status: Home / Self Care | Payer: Medicare Other | Source: Ambulatory Visit | Attending: Pulmonary Disease | Admitting: Pulmonary Disease

## 2021-01-20 ENCOUNTER — Encounter: Payer: Self-pay | Admitting: Genetic Counselor

## 2021-01-20 ENCOUNTER — Other Ambulatory Visit: Payer: Self-pay

## 2021-01-20 DIAGNOSIS — J479 Bronchiectasis, uncomplicated: Secondary | ICD-10-CM | POA: Diagnosis not present

## 2021-01-20 DIAGNOSIS — R0602 Shortness of breath: Secondary | ICD-10-CM | POA: Diagnosis not present

## 2021-01-20 DIAGNOSIS — I700 Atherosclerosis of aorta: Secondary | ICD-10-CM | POA: Diagnosis not present

## 2021-01-20 DIAGNOSIS — J984 Other disorders of lung: Secondary | ICD-10-CM

## 2021-01-25 ENCOUNTER — Other Ambulatory Visit: Payer: Self-pay | Admitting: Hematology

## 2021-01-25 ENCOUNTER — Ambulatory Visit: Payer: Medicare Other | Admitting: Internal Medicine

## 2021-02-01 ENCOUNTER — Ambulatory Visit (INDEPENDENT_AMBULATORY_CARE_PROVIDER_SITE_OTHER): Payer: Medicare Other | Admitting: Internal Medicine

## 2021-02-01 ENCOUNTER — Encounter: Payer: Self-pay | Admitting: Internal Medicine

## 2021-02-01 ENCOUNTER — Other Ambulatory Visit: Payer: Self-pay

## 2021-02-01 VITALS — BP 162/102 | HR 94 | Temp 97.7°F | Resp 18 | Ht 62.0 in | Wt 162.5 lb

## 2021-02-01 DIAGNOSIS — R7989 Other specified abnormal findings of blood chemistry: Secondary | ICD-10-CM | POA: Diagnosis not present

## 2021-02-01 DIAGNOSIS — F32A Depression, unspecified: Secondary | ICD-10-CM

## 2021-02-01 DIAGNOSIS — R739 Hyperglycemia, unspecified: Secondary | ICD-10-CM

## 2021-02-01 DIAGNOSIS — F419 Anxiety disorder, unspecified: Secondary | ICD-10-CM | POA: Diagnosis not present

## 2021-02-01 DIAGNOSIS — I10 Essential (primary) hypertension: Secondary | ICD-10-CM

## 2021-02-01 DIAGNOSIS — R0602 Shortness of breath: Secondary | ICD-10-CM | POA: Diagnosis not present

## 2021-02-01 MED ORDER — ESCITALOPRAM OXALATE 10 MG PO TABS: 10.0000 mg | ORAL_TABLET | Freq: Every day | ORAL | 1 refills | Status: DC

## 2021-02-01 MED ORDER — ALPRAZOLAM 0.25 MG PO TABS: 0.2500 mg | ORAL_TABLET | Freq: Every evening | ORAL | 3 refills | Status: DC | PRN

## 2021-02-01 MED ORDER — FAMOTIDINE 20 MG PO TABS: 20.0000 mg | ORAL_TABLET | Freq: Two times a day (BID) | ORAL | 3 refills | Status: DC

## 2021-02-01 NOTE — Progress Notes (Signed)
Subjective:    Patient ID: [name], female    DOB: 06-28-1946, 75 y.o.   MRN: [id number]  DOS:  02/01/2021 Type of visit - description: F/U  Today we talk about anxiety, hypertension, note from pulmonary reviewed, previous labs reviewed, TFTs needed.  She is now more anxious, symptoms improved with Xanax but they are getting worse. She is unable to drive due to anxiety. She sleeps okay, no major depression, no suicidal ideas.  Still some DOE  Also, she "hit the tailbone" few weeks ago and since then she is having pain in the area if she sits down for too long  Review of Systems See above   Past Medical History:  Diagnosis Date   Anxiety and depression    Arthritis    Blood transfusion without reported diagnosis 1989   GB hemorrage    Cataract    Bil/no surgery yet.   Coccyx pain    Secondary to scar tissue from old pressure ulcer after back surgery, Dr. Brantley Stage   Coronary artery disease    Depression 2010   panic attacks and depression   Dizziness    severe: MRI chronic isch. changes and mastoiditis- saw Dr.Mundy(2007)  STATES SHE STILL HAS EPISODES- ESPECIALLY IF SHE GETS UP TOO QUICKLY AFTER LYING DOWN   Eczema    Family history of breast cancer    Family history of kidney cancer    Family history of lung cancer    Family history of melanoma    Family history of stomach cancer    Family history of throat cancer    GERD (gastroesophageal reflux disease)    Goiter    Headache(784.0)    Hyperlipidemia    Hypertension    Pain    PAIN LOWER BACK AND DOWN RIGHT LEG WITH NUMBNESS, TINGLING RT LEG AND FOOT--STENOSIS   Partial tear of right rotator cuff    & labral tear   S/P cardiac cath 2009   Revealing nonobstructive CAD w/ tubular, discrete 40% mid lesion in the circumflex; discrete 30% proximal lesion in the LAD; luminal irregularities, 35% mid lesion in RCA; and generic 40% mid lesion in the RCA. Medical treatment recommended.   S/P cardiac cath  11/24/2014   Revealing nonobstructive CAD w/ tubular, discrete 40% mid lesion in the circumflex; discrete 30% proximal lesion in the LAD; luminal irregularities, 35% mid lesion in RCA; and generic 40% mid lesion in the RCA. Medical treatment recommended.   Stroke [facility]) 2004   mild TIA    Past Surgical History:  Procedure Laterality Date   ABDOMINAL HYSTERECTOMY     oophorectomy L only   BREAST CYST ASPIRATION Left    BREAST CYST EXCISION Left    BREAST LUMPECTOMY WITH RADIOACTIVE SEED LOCALIZATION Left 09/08/2020   Procedure: LEFT BREAST LUMPECTOMY WITH RADIOACTIVE SEED LOCALIZATION;  Surgeon: Stark Klein, MD;  Location: [facility];  Service: General;  Laterality: Left;   BUNIONECTOMY  1990s   LEFT   CHOLECYSTECTOMY     HAND SURGERY  1990s   R hand x 2, L hand x 1   LUMBAR LAMINECTOMY/DECOMPRESSION MICRODISCECTOMY N/A 11/13/2012   Procedure: MICRO LUMBAR DECOMPRESSION L4 - L5 AND L2 - L3 2 LEVELS;  Surgeon: Johnn Hai, MD;  Location: [facility];  Service: Orthopedics;  Laterality: N/A;   SHOULDER SURGERY  2006   left     Allergies as of 02/01/2021       Reactions   Codeine Other (See Comments)  chest and stomach pain, severe abd cramping   Hydrocodone    Whelts all over/swelling in lips   Sertraline Hcl Other (See Comments)    muscle aches, diarrhea, nausea        Medication List        Accurate as of February 01, 2021 11:59 PM. If you have any questions, ask your nurse or doctor.          acetaminophen 500 MG tablet Commonly known as: TYLENOL Take 500 mg by mouth every 6 (six) hours as needed.   ALPRAZolam 0.25 MG tablet Commonly known as: XANAX Take 1-2 tablets (0.25-0.5 mg total) by mouth at bedtime as needed for anxiety or sleep.   amLODipine 10 MG tablet Commonly known as: NORVASC Take 1 tablet (10 mg total) by mouth daily.   aspirin 81 MG chewable tablet Chew by mouth daily.   Breo Ellipta 200-25 MCG/INH Aepb Generic drug:  fluticasone furoate-vilanterol Inhale 1 puff into the lungs daily.   calcium-vitamin D 250-100 MG-UNIT tablet Take 1 tablet by mouth 2 (two) times daily.   escitalopram 10 MG tablet Commonly known as: Lexapro Take 1 tablet (10 mg total) by mouth daily. Started by: Kathlene November, MD   famotidine 20 MG tablet Commonly known as: PEPCID Take 1 tablet (20 mg total) by mouth 2 (two) times daily.   simvastatin 20 MG tablet Commonly known as: ZOCOR Take 1 tablet (20 mg total) by mouth at bedtime.   tamoxifen 20 MG tablet Commonly known as: NOLVADEX Take 1 tablet (20 mg total) by mouth daily.   tamoxifen 10 MG tablet Commonly known as: NOLVADEX TAKE ONE TABLET BY MOUTH DAILY           Objective:   Physical Exam BP (!) 162/102 (BP Location: Left Arm, Patient Position: Sitting, Cuff Size: Small)   Pulse 94   Temp 97.7 F (36.5 C) (Oral)   Resp 18   Ht 5\' 2"  (1.575 m)   Wt 162 lb 8 oz (73.7 kg)   SpO2 97%   BMI 29.72 kg/m  General:   Well developed, NAD, BMI noted. HEENT:  Normocephalic . Face symmetric, atraumatic MSK: Area of pain at the sacrum, normal to palpation.  No openings, no abscess, no redness. Lower extremities: no pretibial edema bilaterally  Skin: Not pale. Not jaundice Neurologic:  alert & oriented X3.  Speech normal, gait appropriate for age and unassisted Psych--  Cognition and judgment appear intact.  Cooperative with normal attention span and concentration.  Behavior appropriate. Slightly anxious but not depressed appearing.      Assessment     Assessment  Prediabetes HTN Hyperlipidemia Anxiety depression, insomnia- xanax rx by pcp GERD CV: TIA? (2004) CAD: catheterization 2009 and 2016: Non-obstructive CAD, 40% blockages, see report. rx medical treatment Goiter: Last ultrasound 12-2014, nodules decrease in size MSK:  --Back - coccyxt pain, lumbar surgery 2014, chronic residual pain --On prn celebrex --on Tramadol (rx by back surgeon)    Vit d def Chronic Dizziness Eczema/psoriasis , sees derm covid infex 08/20/20 Breast cancer 07/26/2020.  Lumpectomy 09/08/2020, Rx tamoxifen  PLAN: Prediabetes: Check A1c HTN: BP elevated today, on amlodipine, ambulatory BPs usually in the 130s.  No change, continue monitoring. SOB, weakness: Extensive chart review: Saw pulmonary for 10-2020, DOE suspected to be at least partially from fibrosis on CT scan of the chest, interstitial lung disease (post-COVID ? ),  deconditioning. Blood work was unremarkable, high resolution CT: ILD, hypersensitive pneumonitis?  PFTs ordered, pending. Rx  Breo Ellipta which helped to some extent. Consider cardiac reevaluation if pulmonary work-up negative. Anxiety: Currently on Xanax only, getting worse, related to the multitude of issues she had with her health this year (COVID, cancer, surgery etc.). She has access to a counselor through the cancer center, I rec SSRI and she agreed.  Start Lexapro, follow-up in few weeks. PDMP reviewed, Xanax refilled Increased TFTs: Per chart review, check labs. Sacralgia:  as described above, recommend to use donut pillow which she has at home. RTC 6 weeks     This visit occurred during the SARS-CoV-2 public health emergency.  Safety protocols were in place, including screening questions prior to the visit, additional usage of staff PPE, and extensive cleaning of exam room while observing appropriate contact time as indicated for disinfecting solutions.

## 2021-02-01 NOTE — Patient Instructions (Signed)
Check the  blood pressure twice a week BP GOAL is between 110/65 and  135/85. If it is consistently higher or lower, let me know  Start escitalopram at nighttime.  GO TO THE LAB : Get the blood work     [place], [place] back for a checkup in 6 weeks.

## 2021-02-02 LAB — T4, FREE: Free T4: 0.62 ng/dL (ref 0.60–1.60)

## 2021-02-02 LAB — TSH: TSH: 5.61 u[IU]/mL — ABNORMAL HIGH (ref 0.35–5.50)

## 2021-02-02 LAB — HEMOGLOBIN A1C: Hgb A1c MFr Bld: 5.6 % (ref 4.6–6.5)

## 2021-02-02 NOTE — Assessment & Plan Note (Signed)
Prediabetes: Check A1c HTN: BP elevated today, on amlodipine, ambulatory BPs usually in the 130s.  No change, continue monitoring. SOB, weakness: Extensive chart review: Saw pulmonary for 10-2020, DOE suspected to be at least partially from fibrosis on CT scan of the chest, interstitial lung disease (post-COVID ? ),  deconditioning. Blood work was unremarkable, high resolution CT: ILD, hypersensitive pneumonitis?  PFTs ordered, pending. Rx Breo Ellipta which helped to some extent. Consider cardiac reevaluation if pulmonary work-up negative. Anxiety: Currently on Xanax only, getting worse, related to the multitude of issues she had with her health this year (COVID, cancer, surgery etc.). She has access to a counselor through the cancer center, I rec SSRI and she agreed.  Start Lexapro, follow-up in few weeks. PDMP reviewed, Xanax refilled Increased TFTs: Per chart review, check labs. Sacralgia:  as described above, recommend to use donut pillow which she has at home. RTC 6 weeks

## 2021-03-15 ENCOUNTER — Ambulatory Visit (INDEPENDENT_AMBULATORY_CARE_PROVIDER_SITE_OTHER): Payer: Medicare Other | Admitting: Internal Medicine

## 2021-03-15 ENCOUNTER — Encounter: Payer: Self-pay | Admitting: Internal Medicine

## 2021-03-15 ENCOUNTER — Other Ambulatory Visit: Payer: Self-pay

## 2021-03-15 VITALS — BP 158/80 | HR 91 | Temp 98.1°F | Resp 16 | Ht 62.0 in | Wt 157.0 lb

## 2021-03-15 DIAGNOSIS — I10 Essential (primary) hypertension: Secondary | ICD-10-CM

## 2021-03-15 DIAGNOSIS — R0602 Shortness of breath: Secondary | ICD-10-CM | POA: Diagnosis not present

## 2021-03-15 DIAGNOSIS — F419 Anxiety disorder, unspecified: Secondary | ICD-10-CM

## 2021-03-15 DIAGNOSIS — F32A Depression, unspecified: Secondary | ICD-10-CM | POA: Diagnosis not present

## 2021-03-15 MED ORDER — ESCITALOPRAM OXALATE 20 MG PO TABS: 20.0000 mg | ORAL_TABLET | Freq: Every day | ORAL | 4 refills | Status: DC

## 2021-03-15 NOTE — Patient Instructions (Addendum)
Call the pulmonary office at: [phone] and schedule your pulmonary function test.  Recommend to proceed with the following vaccines at your pharmacy:  Shingrix (shingles) Tdap (tetanus) Flu shot this fall   Increase escitalopram to 20 mg daily.  We sent a prescription.  Check the  blood pressure 2 or 3 times a   week  BP GOAL is between 110/65 and  135/85. Call in 3 weeks and provide a list of your blood pressure readings    GO TO THE FRONT DESK, PLEASE SCHEDULE YOUR APPOINTMENTS Come back for a checkup in 4 months

## 2021-03-15 NOTE — Progress Notes (Signed)
Subjective:    Patient ID: [name], female    DOB: 11/07/1945, 75 y.o.   MRN: [id number]  DOS:  03/15/2021 Type of visit - description: Follow-up  Today with talk about hypertension, anxiety, shortness of breath and weakness. Since last visit, she started escitalopram, feels a lot better. Still thinks there is room for improvement.   Review of Systems Admits to cough and whitish sputum production.  No wheezing.   Past Medical History:  Diagnosis Date   Anxiety and depression    Arthritis    Blood transfusion without reported diagnosis 1989   GB hemorrage    Cataract    Bil/no surgery yet.   Coccyx pain    Secondary to scar tissue from old pressure ulcer after back surgery, Dr. Brantley Stage   Coronary artery disease    Depression 2010   panic attacks and depression   Dizziness    severe: MRI chronic isch. changes and mastoiditis- saw Dr.Mundy(2007)  STATES SHE STILL HAS EPISODES- ESPECIALLY IF SHE GETS UP TOO QUICKLY AFTER LYING DOWN   Eczema    Family history of breast cancer    Family history of kidney cancer    Family history of lung cancer    Family history of melanoma    Family history of stomach cancer    Family history of throat cancer    GERD (gastroesophageal reflux disease)    Goiter    Headache(784.0)    Hyperlipidemia    Hypertension    Pain    PAIN LOWER BACK AND DOWN RIGHT LEG WITH NUMBNESS, TINGLING RT LEG AND FOOT--STENOSIS   Partial tear of right rotator cuff    & labral tear   S/P cardiac cath 2009   Revealing nonobstructive CAD w/ tubular, discrete 40% mid lesion in the circumflex; discrete 30% proximal lesion in the LAD; luminal irregularities, 35% mid lesion in RCA; and generic 40% mid lesion in the RCA. Medical treatment recommended.   S/P cardiac cath 11/24/2014   Revealing nonobstructive CAD w/ tubular, discrete 40% mid lesion in the circumflex; discrete 30% proximal lesion in the LAD; luminal irregularities, 35% mid lesion in RCA; and  generic 40% mid lesion in the RCA. Medical treatment recommended.   Stroke [facility]) 2004   mild TIA    Past Surgical History:  Procedure Laterality Date   ABDOMINAL HYSTERECTOMY     oophorectomy L only   BREAST CYST ASPIRATION Left    BREAST CYST EXCISION Left    BREAST LUMPECTOMY WITH RADIOACTIVE SEED LOCALIZATION Left 09/08/2020   Procedure: LEFT BREAST LUMPECTOMY WITH RADIOACTIVE SEED LOCALIZATION;  Surgeon: Stark Klein, MD;  Location: [place];  Service: General;  Laterality: Left;   BUNIONECTOMY  1990s   LEFT   CHOLECYSTECTOMY     HAND SURGERY  1990s   R hand x 2, L hand x 1   LUMBAR LAMINECTOMY/DECOMPRESSION MICRODISCECTOMY N/A 11/13/2012   Procedure: MICRO LUMBAR DECOMPRESSION L4 - L5 AND L2 - L3 2 LEVELS;  Surgeon: Johnn Hai, MD;  Location: [facility];  Service: Orthopedics;  Laterality: N/A;   SHOULDER SURGERY  2006   left     Allergies as of 03/15/2021       Reactions   Codeine Other (See Comments)     chest and stomach pain, severe abd cramping   Hydrocodone    Whelts all over/swelling in lips   Sertraline Hcl Other (See Comments)    muscle aches, diarrhea, nausea  Medication List        Accurate as of March 15, 2021 11:59 PM. If you have any questions, ask your nurse or doctor.          acetaminophen 500 MG tablet Commonly known as: TYLENOL Take 500 mg by mouth every 6 (six) hours as needed.   ALPRAZolam 0.25 MG tablet Commonly known as: XANAX Take 1-2 tablets (0.25-0.5 mg total) by mouth at bedtime as needed for anxiety or sleep.   amLODipine 10 MG tablet Commonly known as: NORVASC Take 1 tablet (10 mg total) by mouth daily.   aspirin 81 MG chewable tablet Chew by mouth daily.   Breo Ellipta 200-25 MCG/INH Aepb Generic drug: fluticasone furoate-vilanterol Inhale 1 puff into the lungs daily.   calcium-vitamin D 250-100 MG-UNIT tablet Take 1 tablet by mouth 2 (two) times daily.   escitalopram 20 MG tablet Commonly  known as: Lexapro Take 1 tablet (20 mg total) by mouth daily. What changed:  medication strength how much to take Changed by: Kathlene November, MD   famotidine 20 MG tablet Commonly known as: PEPCID Take 1 tablet (20 mg total) by mouth 2 (two) times daily.   simvastatin 20 MG tablet Commonly known as: ZOCOR Take 1 tablet (20 mg total) by mouth at bedtime.   tamoxifen 20 MG tablet Commonly known as: NOLVADEX Take 1 tablet (20 mg total) by mouth daily.   tamoxifen 10 MG tablet Commonly known as: NOLVADEX TAKE ONE TABLET BY MOUTH DAILY           Objective:   Physical Exam BP (!) 158/80 (BP Location: Left Arm, Patient Position: Sitting, Cuff Size: Small)   Pulse 91   Temp 98.1 F (36.7 C) (Oral)   Resp 16   Ht '5\' 2"'$  (1.575 m)   Wt 157 lb (71.2 kg)   SpO2 97%   BMI 28.72 kg/m  General:   Well developed, NAD, BMI noted. HEENT:  Normocephalic . Face symmetric, atraumatic Lungs:  CTA B Normal respiratory effort, no intercostal retractions, no accessory muscle use. Heart: RRR,  no murmur.  Lower extremities: no pretibial edema bilaterally  Skin: Not pale. Not jaundice Neurologic:  alert & oriented X3.  Speech normal, gait appropriate for age and unassisted Psych--  Cognition and judgment appear intact.  Cooperative with normal attention span and concentration.  Behavior appropriate. No anxious or depressed appearing.  Seems to be doing better compared to the last time     Assessment     Assessment  Prediabetes HTN Hyperlipidemia Anxiety depression, insomnia- xanax rx by pcp GERD CV: TIA? (2004) CAD: catheterization 2009 and 2016: Non-obstructive CAD, 40% blockages, see report. rx medical treatment Goiter: Last ultrasound 12-2014, nodules decrease in size MSK:  --Back - coccyxt pain, lumbar surgery 2014, chronic residual pain --On prn celebrex --on Tramadol (rx by back surgeon)   Vit d def Chronic Dizziness Eczema/psoriasis , sees derm covid infex  08/20/20 Breast cancer 07/26/2020.  Lumpectomy 09/08/2020, Rx tamoxifen  PLAN: HTN: On amlodipine, BP is again elevated today, at home in the 130s although he she was somewhat hesitant about her readings.  Plan: No change, monitor BPs, call in 3 weeks, see AVS SOB, weakness: Symptoms are stable, has not proceed with PFTs, has been unable to contact pulmonary, phone number provided, encouraged to call. Anxiety: Started Lexapro, definitely better, still has enough anxiety that she is not able to drive her car, we talk about possibly increasing the dose and we agreed on Lexapro 20  mg.  Reassess on RTC Vaccine advice provided RTC 4 months   This visit occurred during the SARS-CoV-2 public health emergency.  Safety protocols were in place, including screening questions prior to the visit, additional usage of staff PPE, and extensive cleaning of exam room while observing appropriate contact time as indicated for disinfecting solutions.

## 2021-03-16 NOTE — Assessment & Plan Note (Signed)
HTN: On amlodipine, BP is again elevated today, at home in the 130s although he she was somewhat hesitant about her readings.  Plan: No change, monitor BPs, call in 3 weeks, see AVS SOB, weakness: Symptoms are stable, has not proceed with PFTs, has been unable to contact pulmonary, phone number provided, encouraged to call. Anxiety: Started Lexapro, definitely better, still has enough anxiety that she is not able to drive her car, we talk about possibly increasing the dose and we agreed on Lexapro 20 mg.  Reassess on RTC Vaccine advice provided RTC 4 months

## 2021-03-27 ENCOUNTER — Other Ambulatory Visit: Payer: Self-pay | Admitting: Internal Medicine

## 2021-03-31 ENCOUNTER — Other Ambulatory Visit: Payer: Self-pay

## 2021-03-31 ENCOUNTER — Inpatient Hospital Stay: Payer: Medicare Other

## 2021-03-31 ENCOUNTER — Other Ambulatory Visit: Payer: Self-pay | Admitting: Hematology

## 2021-03-31 ENCOUNTER — Inpatient Hospital Stay: Payer: Medicare Other | Attending: Hematology | Admitting: Hematology

## 2021-03-31 ENCOUNTER — Encounter: Payer: Self-pay | Admitting: Hematology

## 2021-03-31 VITALS — BP 126/63 | HR 88 | Temp 98.3°F | Resp 20 | Ht 62.0 in | Wt 158.3 lb

## 2021-03-31 DIAGNOSIS — G8929 Other chronic pain: Secondary | ICD-10-CM | POA: Diagnosis not present

## 2021-03-31 DIAGNOSIS — Z8051 Family history of malignant neoplasm of kidney: Secondary | ICD-10-CM | POA: Insufficient documentation

## 2021-03-31 DIAGNOSIS — Z808 Family history of malignant neoplasm of other organs or systems: Secondary | ICD-10-CM | POA: Insufficient documentation

## 2021-03-31 DIAGNOSIS — Z9049 Acquired absence of other specified parts of digestive tract: Secondary | ICD-10-CM | POA: Diagnosis not present

## 2021-03-31 DIAGNOSIS — F419 Anxiety disorder, unspecified: Secondary | ICD-10-CM | POA: Insufficient documentation

## 2021-03-31 DIAGNOSIS — E785 Hyperlipidemia, unspecified: Secondary | ICD-10-CM | POA: Insufficient documentation

## 2021-03-31 DIAGNOSIS — Z803 Family history of malignant neoplasm of breast: Secondary | ICD-10-CM | POA: Insufficient documentation

## 2021-03-31 DIAGNOSIS — C50412 Malignant neoplasm of upper-outer quadrant of left female breast: Secondary | ICD-10-CM | POA: Insufficient documentation

## 2021-03-31 DIAGNOSIS — Z8673 Personal history of transient ischemic attack (TIA), and cerebral infarction without residual deficits: Secondary | ICD-10-CM | POA: Diagnosis not present

## 2021-03-31 DIAGNOSIS — Z885 Allergy status to narcotic agent status: Secondary | ICD-10-CM | POA: Insufficient documentation

## 2021-03-31 DIAGNOSIS — Z7951 Long term (current) use of inhaled steroids: Secondary | ICD-10-CM | POA: Insufficient documentation

## 2021-03-31 DIAGNOSIS — Z800 Family history of malignant neoplasm of digestive organs: Secondary | ICD-10-CM | POA: Diagnosis not present

## 2021-03-31 DIAGNOSIS — I2510 Atherosclerotic heart disease of native coronary artery without angina pectoris: Secondary | ICD-10-CM | POA: Insufficient documentation

## 2021-03-31 DIAGNOSIS — Z170 Estrogen receptor positive status [ER+]: Secondary | ICD-10-CM | POA: Insufficient documentation

## 2021-03-31 DIAGNOSIS — M8580 Other specified disorders of bone density and structure, unspecified site: Secondary | ICD-10-CM | POA: Insufficient documentation

## 2021-03-31 DIAGNOSIS — Z79899 Other long term (current) drug therapy: Secondary | ICD-10-CM | POA: Diagnosis not present

## 2021-03-31 DIAGNOSIS — Z8616 Personal history of COVID-19: Secondary | ICD-10-CM | POA: Insufficient documentation

## 2021-03-31 DIAGNOSIS — Z801 Family history of malignant neoplasm of trachea, bronchus and lung: Secondary | ICD-10-CM | POA: Diagnosis not present

## 2021-03-31 DIAGNOSIS — I10 Essential (primary) hypertension: Secondary | ICD-10-CM | POA: Diagnosis not present

## 2021-03-31 DIAGNOSIS — Z8041 Family history of malignant neoplasm of ovary: Secondary | ICD-10-CM | POA: Insufficient documentation

## 2021-03-31 DIAGNOSIS — M549 Dorsalgia, unspecified: Secondary | ICD-10-CM | POA: Diagnosis not present

## 2021-03-31 DIAGNOSIS — K219 Gastro-esophageal reflux disease without esophagitis: Secondary | ICD-10-CM | POA: Insufficient documentation

## 2021-03-31 LAB — CBC WITH DIFFERENTIAL (CANCER CENTER ONLY)
Abs Immature Granulocytes: 0.01 10*3/uL (ref 0.00–0.07)
Basophils Absolute: 0 10*3/uL (ref 0.0–0.1)
Basophils Relative: 0 %
Eosinophils Absolute: 0.2 10*3/uL (ref 0.0–0.5)
Eosinophils Relative: 2 %
HCT: 42.9 % (ref 36.0–46.0)
Hemoglobin: 14.6 g/dL (ref 12.0–15.0)
Immature Granulocytes: 0 %
Lymphocytes Relative: 35 %
Lymphs Abs: 2.3 10*3/uL (ref 0.7–4.0)
MCH: 32.4 pg (ref 26.0–34.0)
MCHC: 34 g/dL (ref 30.0–36.0)
MCV: 95.1 fL (ref 80.0–100.0)
Monocytes Absolute: 0.4 10*3/uL (ref 0.1–1.0)
Monocytes Relative: 6 %
Neutro Abs: 3.7 10*3/uL (ref 1.7–7.7)
Neutrophils Relative %: 57 %
Platelet Count: 115 10*3/uL — ABNORMAL LOW (ref 150–400)
RBC: 4.51 MIL/uL (ref 3.87–5.11)
RDW: 12.9 % (ref 11.5–15.5)
WBC Count: 6.7 10*3/uL (ref 4.0–10.5)
nRBC: 0 % (ref 0.0–0.2)

## 2021-03-31 LAB — CMP (CANCER CENTER ONLY)
ALT: 10 U/L (ref 0–44)
AST: 16 U/L (ref 15–41)
Albumin: 3.6 g/dL (ref 3.5–5.0)
Alkaline Phosphatase: 79 U/L (ref 38–126)
Anion gap: 10 (ref 5–15)
BUN: 10 mg/dL (ref 8–23)
CO2: 24 mmol/L (ref 22–32)
Calcium: 9.4 mg/dL (ref 8.9–10.3)
Chloride: 107 mmol/L (ref 98–111)
Creatinine: 0.82 mg/dL (ref 0.44–1.00)
GFR, Estimated: >60 mL/min (ref >60)
Glucose, Bld: 157 mg/dL — ABNORMAL HIGH (ref 70–99)
Potassium: 4.2 mmol/L (ref 3.5–5.1)
Sodium: 141 mmol/L (ref 135–145)
Total Bilirubin: 0.5 mg/dL (ref 0.3–1.2)
Total Protein: 6.5 g/dL (ref 6.5–8.1)

## 2021-03-31 MED ORDER — TAMOXIFEN CITRATE 10 MG PO TABS: 10.0000 mg | ORAL_TABLET | Freq: Every day | ORAL | 1 refills | Status: DC

## 2021-03-31 NOTE — Progress Notes (Signed)
[place]   [phone] [phone]   Clinic Follow up Note   Patient Care Team: Colon Branch, MD as PCP - General [name], Nadara Mustard, MD as Consulting Physician (Gynecology) Ladene Artist, MD as Consulting Physician (Gastroenterology) Erroll Luna, MD as Consulting Physician (General Surgery) Pedro Earls, MD as Attending Physician (Orthopedic Surgery) Netta Cedars, MD as Consulting Physician (Orthopedic Surgery) Linda Hedges, DO as Consulting Physician (Obstetrics and Gynecology) Mauro Kaufmann, RN as Oncology Nurse Navigator Rockwell Germany, RN as Oncology Nurse Navigator Stark Klein, MD as Consulting Physician (General Surgery) Truitt Merle, MD as Consulting Physician (Hematology) Eppie Gibson, MD as Attending Physician (Radiation Oncology) Edythe Clarity, [facility] (Pharmacist) Edythe Clarity, [facility] (Pharmacist) Alla Feeling, NP as Nurse Practitioner (Nurse Practitioner)  Date of Service:  03/31/2021  CHIEF COMPLAINT: f/u of left breast cancer  CURRENT THERAPY:  Tamoxifen 76m daily starting 09/2020  ASSESSMENT & PLAN:  Jenny MASELLIis a 75y.o. female with   1. Malignant neoplasm of upper-outer quadrant of left breast, Stage IA, c(T1bN0M0), ER+/PR+/HER2-, Grade II -She was diagnosed in 07/2020 with 836mmass in left breast and biopsy confirmed invasive ductal carcinoma on biopsy.  -She underwent left breast lumpectomy on 09/08/20 with Dr ByBarry DienesHer Surgical path showed 0.7cm of invasive ductal carcinoma which was completely resected with negative margins, Grade 1.  -Radiation was discussed but pt opted to forego. Given small tumor size, Oncotype was not recommended.  -She started tamoxifen in 09/2020. She is tolerating well with minimal side effects. -she is clinically doing well. Labs reviewed, CBC WNL except plt 115k. CMP pending. Physical exam today unremarkable. There is no clinical concern for recurrence. -She will  continue annual screening mammogram, self exams, and a routine office visit with lab and exam with [place] -labs and f/u in 6 months with NP Lacie.    2. Comorbidities: HTN, HLD, GERD, Anxiety, Chronic back pain (manageable)  -On medication (Xanax, Amlodipine, Celebrex, Flexeril, Protonix, Prednisone). Continue to f/u with PCP  -Per pt, her PCP has concern she has underactive thyroid and may require thyroid replacement.  -placed on lexapro for recent increase in panic attacks on 03/15/21. She feels this is helping.   3. Genetics -Genetic testing was recommend given FMHx of breast cancer and Ovarian cancer: Patient seen by EmRaquel Sarnan 08/10/20 and previously declined testing -She agreed to proceed on 12/29/20. Results were negative.  4. Covid-19+ 08/20/20 -has residual issues with breathing. -high resolution chest CT on 01/20/21 showed interstitial lung disease, with a spectrum of findings considered most compatible with chronic hypersensitivity pneumonitis.   5. Osteopenia  -Her 06/2019 DEXA shows osteopenia at forearm radius with T-score -1.6. She taking Vit D3.   -I discussed if she proceeds with Tamoxifen this can help strengthen her bones.      PLAN:  -continue tamoxifen -next mammogram due 06/2021 -labs and f/u in 6 months with NP Lacie   No problem-specific Assessment & Plan notes found for this encounter.   SUMMARY OF ONCOLOGIC HISTORY: Oncology History Overview Note  Cancer Staging Malignant neoplasm of upper-outer quadrant of left breast in female, estrogen receptor positive ([place] form: Breast, AJCC 8th Edition - Clinical stage from 07/26/2020: Stage IA (cT1b, cN0, cM0, G2, ER+, PR+, HER2-) - Signed by FeTruitt MerleMD on 08/09/2020    Malignant neoplasm of upper-outer quadrant of left breast in female, estrogen receptor positive ([place] 07/04/2020 Mammogram   IMPRESSION: Indeterminate 4 x  8 x 5 mm irregular hypoechoic mass left breast 1 o'clock position 4 cm from the nipple.    07/26/2020 Cancer Staging   Staging form: Breast, AJCC 8th Edition - Clinical stage from 07/26/2020: Stage IA (cT1b, cN0, cM0, G2, ER+, PR+, HER2-) - Signed by Truitt Merle, MD on 08/09/2020   07/26/2020 Initial Biopsy   Diagnosis Breast, left, needle core biopsy, upper outer 1 o'clock - INVASIVE DUCTAL CARCINOMA - SEE COMMENT Microscopic Comment Based on the biopsy, the carcinoma appears Nottingham grade 2 of 3 and measures 0.7 cm in greatest linear extent. Prognostic markers (ER/PR/ki-67/HER2) are pending and will be reported in an addendum. Dr. Jeannie Done reviewed the case and agrees with the above diagnosis. These results were called to [facility] on July 27, 2018.   07/26/2020 Receptors her2   PROGNOSTIC INDICATORS Results: IMMUNOHISTOCHEMICAL AND MORPHOMETRIC ANALYSIS PERFORMED MANUALLY The tumor cells are NEGATIVE for Her2 (1+). Estrogen Receptor: 90%, POSITIVE, STRONG STAINING INTENSITY Progesterone Receptor: 80%, POSITIVE, STRONG STAINING INTENSITY Proliferation Marker Ki67: 2%   08/03/2020 Initial Diagnosis   Malignant neoplasm of upper-outer quadrant of left breast in female, estrogen receptor positive ([place])   09/08/2020 Surgery   LEFT BREAST LUMPECTOMY WITH RADIOACTIVE SEED LOCALIZATION by Dr Barry Dienes   09/08/2020 Pathology Results   FINAL MICROSCOPIC DIAGNOSIS:   A. 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  - Margins not involved.  - Invasive carcinoma 0.1 cm from anterior margin.  - Biopsy site and biopsy clip.  - Fibrocystic changes.  - See oncology table.     09/2020 -  Anti-estrogen oral therapy   Tamoxifen 50m daily starting 09/2020   12/27/2020 Cancer Staging   Staging form: Breast, AJCC 8th Edition - Pathologic: Stage Unknown (pT1b, pNX, cM0, G1, ER+, PR+, HER2-) - Signed by BAlla Feeling NP on 12/27/2020 Histologic grading system: 3 grade system   12/29/2020 Survivorship   SCP delivered by LCira Rue NP    01/17/2021  Genetic Testing   Negative genetic testing:  No pathogenic variants detected on the [facility] CancerNext-Expanded + RNAinsight panel. The report date is 01/17/2021.   The CancerNext-Expanded + RNAinsight gene panel offered by [employer] includes sequencing and rearrangement analysis for the following 77 genes: AIP, ALK, APC, ATM, AXIN2, BAP1, BARD1, BLM, BMPR1A, BRCA1, BRCA2, BRIP1, CDC73, CDH1, CDK4, CDKN1B, CDKN2A, CHEK2, CTNNA1, DICER1, FANCC, FH, FLCN, GALNT12, KIF1B, LZTR1, MAX, MEN1, MET, MLH1, MSH2, MSH3, MSH6, MUTYH, NBN, NF1, NF2, NTHL1, PALB2, PHOX2B, PMS2, POT1, PRKAR1A, PTCH1, PTEN, RAD51C, RAD51D, RB1, RECQL, RET, SDHA, SDHAF2, SDHB, SDHC, SDHD, SMAD4, SMARCA4, SMARCB1, SMARCE1, STK11, SUFU, TMEM127, TP53, TSC1, TSC2, VHL and XRCC2 (sequencing and deletion/duplication); EGFR, EGLN1, HOXB13, KIT, MITF, PDGFRA, POLD1 and POLE (sequencing only); EPCAM and GREM1 (deletion/duplication only). RNA data is routinely analyzed for use in variant interpretation for all genes.      INTERVAL HISTORY:  JSHITAL CRAYTONis here for a follow up of breast cancer. She was last seen by me on 09/30/20 and by NP Lacie in the interim. She presents to the clinic alone. She reports she has been having more panic attacks recently. She denies having anxiety previously (but her chart notes depression/anxiety since at least 2010). She reports she has been placed on medication, which has been helping.  She notes she lives with her son, and she has recently been caring for her brother. She reports issues with her breathing due to her prior Covid infection. She notes she is scheduled to  see pulmonology for this.   All other systems were reviewed with the patient and are negative.  MEDICAL HISTORY:  Past Medical History:  Diagnosis Date   Anxiety and depression    Arthritis    Blood transfusion without reported diagnosis 1989   GB hemorrage    Cataract    Bil/no surgery yet.   Coccyx pain    Secondary to  scar tissue from old pressure ulcer after back surgery, Dr. Brantley Stage   Coronary artery disease    Depression 2010   panic attacks and depression   Dizziness    severe: MRI chronic isch. changes and mastoiditis- saw Dr.Mundy(2007)  STATES SHE STILL HAS EPISODES- ESPECIALLY IF SHE GETS UP TOO QUICKLY AFTER LYING DOWN   Eczema    Family history of breast cancer    Family history of kidney cancer    Family history of lung cancer    Family history of melanoma    Family history of stomach cancer    Family history of throat cancer    GERD (gastroesophageal reflux disease)    Goiter    Headache(784.0)    Hyperlipidemia    Hypertension    Pain    PAIN LOWER BACK AND DOWN RIGHT LEG WITH NUMBNESS, TINGLING RT LEG AND FOOT--STENOSIS   Partial tear of right rotator cuff    & labral tear   S/P cardiac cath 2009   Revealing nonobstructive CAD w/ tubular, discrete 40% mid lesion in the circumflex; discrete 30% proximal lesion in the LAD; luminal irregularities, 35% mid lesion in RCA; and generic 40% mid lesion in the RCA. Medical treatment recommended.   S/P cardiac cath 11/24/2014   Revealing nonobstructive CAD w/ tubular, discrete 40% mid lesion in the circumflex; discrete 30% proximal lesion in the LAD; luminal irregularities, 35% mid lesion in RCA; and generic 40% mid lesion in the RCA. Medical treatment recommended.   Stroke [facility]) 2004   mild TIA    SURGICAL HISTORY: Past Surgical History:  Procedure Laterality Date   ABDOMINAL HYSTERECTOMY     oophorectomy L only   BREAST CYST ASPIRATION Left    BREAST CYST EXCISION Left    BREAST LUMPECTOMY WITH RADIOACTIVE SEED LOCALIZATION Left 09/08/2020   Procedure: LEFT BREAST LUMPECTOMY WITH RADIOACTIVE SEED LOCALIZATION;  Surgeon: Stark Klein, MD;  Location: [place];  Service: General;  Laterality: Left;   BUNIONECTOMY  1990s   LEFT   CHOLECYSTECTOMY     HAND SURGERY  1990s   R hand x 2, L hand x 1   LUMBAR  LAMINECTOMY/DECOMPRESSION MICRODISCECTOMY N/A 11/13/2012   Procedure: MICRO LUMBAR DECOMPRESSION L4 - L5 AND L2 - L3 2 LEVELS;  Surgeon: Johnn Hai, MD;  Location[facility];  Service: Orthopedics;  Laterality: N/A;   SHOULDER SURGERY  2006   left     I have reviewed the social history and family history with the patient and they are unchanged from previous note.  ALLERGIES:  is allergic to codeine, hydrocodone, and sertraline hcl.  MEDICATIONS:  Current Outpatient Medications  Medication Sig Dispense Refill   acetaminophen (TYLENOL) 500 MG tablet Take 500 mg by mouth every 6 (six) hours as needed.     ALPRAZolam (XANAX) 0.25 MG tablet Take 1-2 tablets (0.25-0.5 mg total) by mouth at bedtime as needed for anxiety or sleep. 30 tablet 3   amLODipine (NORVASC) 10 MG tablet Take 1 tablet (10 mg total) by mouth daily. 90 tablet 1   aspirin 81 MG  chewable tablet Chew by mouth daily.     calcium-vitamin D 250-100 MG-UNIT tablet Take 1 tablet by mouth 2 (two) times daily.     escitalopram (LEXAPRO) 20 MG tablet Take 1 tablet (20 mg total) by mouth daily. 30 tablet 4   famotidine (PEPCID) 20 MG tablet Take 1 tablet (20 mg total) by mouth 2 (two) times daily. 180 tablet 3   fluticasone furoate-vilanterol (BREO ELLIPTA) 200-25 MCG/INH AEPB Inhale 1 puff into the lungs daily. 60 each 11   simvastatin (ZOCOR) 20 MG tablet Take 1 tablet (20 mg total) by mouth at bedtime. 90 tablet 1   tamoxifen (NOLVADEX) 10 MG tablet Take 1 tablet (10 mg total) by mouth daily. 90 tablet 1   No current facility-administered medications for this visit.    PHYSICAL EXAMINATION: ECOG PERFORMANCE STATUS: 0 - Asymptomatic  Vitals:   03/31/21 1120  BP: 126/63  Pulse: 88  Resp: 20  Temp: 98.3 F (36.8 C)  SpO2: 96%   Wt Readings from Last 3 Encounters:  03/31/21 158 lb 4.8 oz (71.8 kg)  03/15/21 157 lb (71.2 kg)  02/01/21 162 lb 8 oz (73.7 kg)     GENERAL:alert, no distress and comfortable SKIN: skin color,  texture, turgor are normal, no rashes or significant lesions EYES: normal, Conjunctiva are pink and non-injected, sclera clear  NECK: supple, thyroid normal size, non-tender, without nodularity LYMPH:  no palpable lymphadenopathy in the cervical, axillary  LUNGS: clear to auscultation and percussion with normal breathing effort HEART: regular rate & rhythm and no murmurs and no lower extremity edema ABDOMEN:abdomen soft, non-tender and normal bowel sounds Musculoskeletal:no cyanosis of digits and no clubbing  NEURO: alert & oriented x 3 with fluent speech, no focal motor/sensory deficits BREAST: No palpable mass, nodules or adenopathy bilaterally. Breast exam benign.   LABORATORY DATA:  I have reviewed the data as listed CBC Latest Ref Rng & Units 03/31/2021 10/05/2020 08/10/2020  WBC 4.0 - 10.5 K/uL 6.7 9.4 4.6  Hemoglobin 12.0 - 15.0 g/dL 14.6 15.7(H) 16.3(H)  Hematocrit 36.0 - 46.0 % 42.9 46.5(H) 47.6(H)  Platelets 150 - 400 K/uL 115(L) 141.0(L) 121(L)     CMP Latest Ref Rng & Units 03/31/2021 10/05/2020 08/10/2020  Glucose 70 - 99 mg/dL 157(H) 135(H) 124(H)  BUN 8 - 23 mg/dL 10 11 6(L)  Creatinine 0.44 - 1.00 mg/dL 0.82 0.67 0.73  Sodium 135 - 145 mmol/L 141 138 142  Potassium 3.5 - 5.1 mmol/L 4.2 3.9 3.4(L)  Chloride 98 - 111 mmol/L 107 102 104  CO2 22 - 32 mmol/L 24 22 24   Calcium 8.9 - 10.3 mg/dL 9.4 9.5 9.0  Total Protein 6.5 - 8.1 g/dL 6.5 - 7.3  Total Bilirubin 0.3 - 1.2 mg/dL 0.5 - 0.6  Alkaline Phos 38 - 126 U/L 79 - 80  AST 15 - 41 U/L 16 - 46(H)  ALT 0 - 44 U/L 10 - 31      RADIOGRAPHIC STUDIES: I have personally reviewed the radiological images as listed and agreed with the findings in the report. No results found.    Orders Placed This Encounter  Procedures   MM DIAG BREAST TOMO BILATERAL    Standing Status:   Future    Standing Expiration Date:   03/31/2022    Order Specific Question:   Reason for Exam (SYMPTOM  OR DIAGNOSIS REQUIRED)    Answer:   screening     Order Specific Question:   Preferred imaging location?    Answer:  [facility]    All questions were answered. The patient knows to call the clinic with any problems, questions or concerns. No barriers to learning was detected. The total time spent in the appointment was 30 minutes.     Truitt Merle, MD 03/31/2021   I, Wilburn Mylar, am acting as scribe for Truitt Merle, MD.   I have reviewed the above documentation for accuracy and completeness, and I agree with the above.

## 2021-04-16 ENCOUNTER — Other Ambulatory Visit: Payer: Self-pay | Admitting: Internal Medicine

## 2021-05-05 ENCOUNTER — Ambulatory Visit: Payer: Medicare Other | Admitting: Pulmonary Disease

## 2021-05-16 DIAGNOSIS — C50412 Malignant neoplasm of upper-outer quadrant of left female breast: Secondary | ICD-10-CM | POA: Diagnosis not present

## 2021-05-16 DIAGNOSIS — Z170 Estrogen receptor positive status [ER+]: Secondary | ICD-10-CM | POA: Diagnosis not present

## 2021-05-23 ENCOUNTER — Other Ambulatory Visit: Payer: Self-pay | Admitting: Hematology

## 2021-07-13 ENCOUNTER — Other Ambulatory Visit: Payer: Self-pay | Admitting: Internal Medicine

## 2021-07-18 ENCOUNTER — Encounter: Payer: Self-pay | Admitting: Internal Medicine

## 2021-07-18 ENCOUNTER — Ambulatory Visit (INDEPENDENT_AMBULATORY_CARE_PROVIDER_SITE_OTHER): Payer: Medicare Other | Admitting: Internal Medicine

## 2021-07-18 VITALS — BP 136/88 | HR 56 | Temp 97.7°F | Resp 18 | Ht 62.0 in | Wt 147.2 lb

## 2021-07-18 DIAGNOSIS — R42 Dizziness and giddiness: Secondary | ICD-10-CM | POA: Diagnosis not present

## 2021-07-18 DIAGNOSIS — F32A Depression, unspecified: Secondary | ICD-10-CM | POA: Diagnosis not present

## 2021-07-18 DIAGNOSIS — R7989 Other specified abnormal findings of blood chemistry: Secondary | ICD-10-CM

## 2021-07-18 DIAGNOSIS — U071 COVID-19: Secondary | ICD-10-CM | POA: Diagnosis not present

## 2021-07-18 DIAGNOSIS — F419 Anxiety disorder, unspecified: Secondary | ICD-10-CM | POA: Diagnosis not present

## 2021-07-18 DIAGNOSIS — I10 Essential (primary) hypertension: Secondary | ICD-10-CM | POA: Diagnosis not present

## 2021-07-18 LAB — TSH: TSH: 4.76 u[IU]/mL (ref 0.35–5.50)

## 2021-07-18 LAB — T4, FREE: Free T4: 0.75 ng/dL (ref 0.60–1.60)

## 2021-07-18 NOTE — Progress Notes (Signed)
Subjective:    Patient ID: [name], female    DOB: December 29, 1945, 75 y.o.   MRN: [id number]  DOS:  07/18/2021 Type of visit - description: f/u  Multiple issues discussed .  She developed fever last week, on 07/11/2021 tested positive for COVID. She stayed home and did not seek medical attention. Developed fever, chronic dizziness got worse, had diarrhea, + cough w/ sputum. Overall this week she feels better, does not feel feverish, no nausea vomiting No diarrhea No chest pain Shortness of breath at baseline.  She has on and off dizziness, she thinks is worse due to the recent infection but also possibly related to increased Lexapro dose from 10 mg to 20 mg. Denies any stroke symptoms such as double vision, slurred speech, motor deficits  BP was in the low side last few days, as low was 115/54 so she is holding amlodipine.  Review of Systems See above   Past Medical History:  Diagnosis Date   Anxiety and depression    Arthritis    Blood transfusion without reported diagnosis 1989   GB hemorrage    Cataract    Bil/no surgery yet.   Coccyx pain    Secondary to scar tissue from old pressure ulcer after back surgery, Dr. Brantley Stage   Coronary artery disease    Depression 2010   panic attacks and depression   Dizziness    severe: MRI chronic isch. changes and mastoiditis- saw Dr.Mundy(2007)  STATES SHE STILL HAS EPISODES- ESPECIALLY IF SHE GETS UP TOO QUICKLY AFTER LYING DOWN   Eczema    Family history of breast cancer    Family history of kidney cancer    Family history of lung cancer    Family history of melanoma    Family history of stomach cancer    Family history of throat cancer    GERD (gastroesophageal reflux disease)    Goiter    Headache(784.0)    Hyperlipidemia    Hypertension    Pain    PAIN LOWER BACK AND DOWN RIGHT LEG WITH NUMBNESS, TINGLING RT LEG AND FOOT--STENOSIS   Partial tear of right rotator cuff    & labral tear   S/P cardiac cath 2009    Revealing nonobstructive CAD w/ tubular, discrete 40% mid lesion in the circumflex; discrete 30% proximal lesion in the LAD; luminal irregularities, 35% mid lesion in RCA; and generic 40% mid lesion in the RCA. Medical treatment recommended.   S/P cardiac cath 11/24/2014   Revealing nonobstructive CAD w/ tubular, discrete 40% mid lesion in the circumflex; discrete 30% proximal lesion in the LAD; luminal irregularities, 35% mid lesion in RCA; and generic 40% mid lesion in the RCA. Medical treatment recommended.   Stroke [facility] [place]) 2004   mild TIA    Past Surgical History:  Procedure Laterality Date   ABDOMINAL HYSTERECTOMY     oophorectomy L only   BREAST CYST ASPIRATION Left    BREAST CYST EXCISION Left    BREAST LUMPECTOMY WITH RADIOACTIVE SEED LOCALIZATION Left 09/08/2020   Procedure: LEFT BREAST LUMPECTOMY WITH RADIOACTIVE SEED LOCALIZATION;  Surgeon: Stark Klein, MD;  Location: [facility];  Service: General;  Laterality: Left;   BUNIONECTOMY  1990s   LEFT   CHOLECYSTECTOMY     HAND SURGERY  1990s   R hand x 2, L hand x 1   LUMBAR LAMINECTOMY/DECOMPRESSION MICRODISCECTOMY N/A 11/13/2012   Procedure: MICRO LUMBAR DECOMPRESSION L4 - L5 AND L2 - L3 2 LEVELS;  Surgeon:  Johnn Hai, MD;  Location: [facility];  Service: Orthopedics;  Laterality: N/A;   SHOULDER SURGERY  2006   left     Allergies as of 07/18/2021       Reactions   Codeine Other (See Comments)     chest and stomach pain, severe abd cramping   Hydrocodone    Whelts all over/swelling in lips   Sertraline Hcl Other (See Comments)    muscle aches, diarrhea, nausea        Medication List        Accurate as of July 18, 2021 11:59 PM. If you have any questions, ask your nurse or doctor.          acetaminophen 500 MG tablet Commonly known as: TYLENOL Take 500 mg by mouth every 6 (six) hours as needed.   ALPRAZolam 0.25 MG tablet Commonly known as: XANAX Take 1-2 tablets (0.25-0.5 mg total) by  mouth at bedtime as needed for anxiety or sleep.   amLODipine 10 MG tablet Commonly known as: NORVASC TAKE 1 TABLET(10 MG) BY MOUTH DAILY   aspirin 81 MG chewable tablet Chew by mouth daily.   Breo Ellipta 200-25 MCG/ACT Aepb Generic drug: fluticasone furoate-vilanterol Inhale 1 puff into the lungs daily.   calcium-vitamin D 250-100 MG-UNIT tablet Take 1 tablet by mouth 2 (two) times daily.   famotidine 20 MG tablet Commonly known as: PEPCID Take 1 tablet (20 mg total) by mouth 2 (two) times daily.   Lexapro 20 MG tablet Generic drug: escitalopram Take 0.5 tablets (10 mg total) by mouth daily. What changed: how much to take Changed by: Kathlene November, MD   simvastatin 20 MG tablet Commonly known as: ZOCOR TAKE 1 TABLET(20 MG) BY MOUTH AT BEDTIME   tamoxifen 10 MG tablet Commonly known as: NOLVADEX TAKE ONE TABLET BY MOUTH DAILY           Objective:   Physical Exam BP 136/88 (BP Location: Left Arm, Patient Position: Sitting, Cuff Size: Small)    Pulse (!) 56    Temp 97.7 F (36.5 C) (Oral)    Resp 18    Ht 5\' 2"  (1.575 m)    Wt 147 lb 4 oz (66.8 kg)    SpO2 96%    BMI 26.93 kg/m  General:   Well developed, NAD, BMI noted. HEENT:  Normocephalic . Face symmetric, atraumatic Lungs:  CTA B Normal respiratory effort, no intercostal retractions, no accessory muscle use. Heart: RRR,  no murmur.  Lower extremities: no pretibial edema bilaterally  Skin: Not pale. Not jaundice Neurologic:  alert & oriented X3.  Speech normal, gait appropriate for age and unassisted Psych--  Cognition and judgment appear intact.  Cooperative with normal attention span and concentration.  Behavior appropriate. No anxious or depressed appearing.      Assessment    Assessment  Prediabetes HTN Hyperlipidemia Anxiety depression, insomnia- xanax rx by pcp GERD CV: TIA? (2004) CAD: catheterization 2009 and 2016: Non-obstructive CAD, 40% blockages, see report. rx medical  treatment Goiter: Last ultrasound 12-2014, nodules decrease in size MSK:  --Back - coccyxt pain, lumbar surgery 2014, chronic residual pain --On prn celebrex --on Tramadol (rx by back surgeon)   Vit d def Chronic Dizziness Eczema/psoriasis , sees derm covid infex 08/20/20, 06-2021 Breast cancer 07/26/2020.  Lumpectomy 09/08/2020, Rx tamoxifen  PLAN: COVID-19: Had [place] infex 1- 2022 and again last week.  She felt feverish at first but that is resolved, she still has some cough with clear sputum.  She does not look toxic. Plan: Rest, fluids, Robitussin-DM, call if not gradually better. She never had a COVID-vaccine, encouraged to think about it. She is also aware that she is high risk for complications; also aware of antivirals  rx within the first 5 days of illness. HTN: Report patient BPs has been" low side" but never less than 115/54.  She is holding amlodipine, recommend to go back on it and monitor BPs. Anxiety: She thinks her chronic dizziness is worse since Lexapro increased to 20 mg, we agreed she go back on 10 mg only. Chronic dizziness: Possibly multifactorial, only when she moves, stands up, goes to bed.  If she is resting in the same position essentially no symptoms.  ROS : no stroke sxs.  Recommend good hydration.  See above. Increased TSH: Recheck labs Breast cancer: Saw oncology 03/31/2021 RTC 4 months   Today I managed for chronic problems (HTN, anxiety, dizziness, increased TSH) and an acute issue (recent COVID infection).  Patient extensively educated about COVID.  This visit occurred during the SARS-CoV-2 public health emergency.  Safety protocols were in place, including screening questions prior to the visit, additional usage of staff PPE, and extensive cleaning of exam room while observing appropriate contact time as indicated for disinfecting solutions.

## 2021-07-18 NOTE — Patient Instructions (Addendum)
Go back on amlodipine tomorrow. Drink plenty of fluids Check the  blood pressure regularly BP GOAL is between 110/65 and  135/85. If it is consistently higher or lower, let me know  Please consider COVID-vaccine.  GO TO THE LAB : Get the blood work     GO TO THE FRONT DESK, [place] Come back for  a physical exam in 4 months

## 2021-07-19 NOTE — Assessment & Plan Note (Signed)
COVID-19: Had [place] infex 1- 2022 and again last week.  She felt feverish at first but that is resolved, she still has some cough with clear sputum.  She does not look toxic. Plan: Rest, fluids, Robitussin-DM, call if not gradually better. She never had a COVID-vaccine, encouraged to think about it. She is also aware that she is high risk for complications; also aware of antivirals  rx within the first 5 days of illness. HTN: Report patient BPs has been" low side" but never less than 115/54.  She is holding amlodipine, recommend to go back on it and monitor BPs. Anxiety: She thinks her chronic dizziness is worse since Lexapro increased to 20 mg, we agreed she go back on 10 mg only. Chronic dizziness: Possibly multifactorial, only when she moves, stands up, goes to bed.  If she is resting in the same position essentially no symptoms.  ROS : no stroke sxs.  Recommend good hydration.  See above. Increased TSH: Recheck labs Breast cancer: Saw oncology 03/31/2021 RTC 4 months

## 2021-08-13 ENCOUNTER — Other Ambulatory Visit: Payer: Self-pay | Admitting: Internal Medicine

## 2021-09-01 ENCOUNTER — Other Ambulatory Visit: Payer: Self-pay | Admitting: Hematology

## 2021-09-27 NOTE — Progress Notes (Signed)
[place]   Telephone:([phone] Fax:(336) [phone]   Clinic Follow up Note   Patient Care Team: Colon Branch, MD as PCP - General [name], Nadara Mustard, MD as Consulting Physician (Gynecology) Ladene Artist, MD as Consulting Physician (Gastroenterology) Erroll Luna, MD as Consulting Physician (General Surgery) Pedro Earls, MD as Attending Physician (Orthopedic Surgery) Netta Cedars, MD as Consulting Physician (Orthopedic Surgery) Linda Hedges, DO as Consulting Physician (Obstetrics and Gynecology) Mauro Kaufmann, RN as Oncology Nurse Navigator Rockwell Germany, RN as Oncology Nurse Navigator Stark Klein, MD as Consulting Physician (General Surgery) Truitt Merle, MD as Consulting Physician (Hematology) Eppie Gibson, MD as Attending Physician (Radiation Oncology) Edythe Clarity, [facility] (Pharmacist) Edythe Clarity, [facility] (Pharmacist) Alla Feeling, NP as Nurse Practitioner (Nurse Practitioner) 09/28/2021  CHIEF COMPLAINT: Follow-up left breast cancer  SUMMARY OF ONCOLOGIC HISTORY: Oncology History Overview Note  Cancer Staging Malignant neoplasm of upper-outer quadrant of left breast in female, estrogen receptor positive ([place]) Staging form: Breast, AJCC 8th Edition - Clinical stage from 07/26/2020: Stage IA (cT1b, cN0, cM0, G2, ER+, PR+, HER2-) - Signed by Truitt Merle, MD on 08/09/2020    Malignant neoplasm of upper-outer quadrant of left breast in female, estrogen receptor positive ([place])  07/04/2020 Mammogram   IMPRESSION: Indeterminate 4 x 8 x 5 mm irregular hypoechoic mass left breast 1 o'clock position 4 cm from the nipple.   07/26/2020 Cancer Staging   Staging form: Breast, AJCC 8th Edition - Clinical stage from 07/26/2020: Stage IA (cT1b, cN0, cM0, G2, ER+, PR+, HER2-) - Signed by Truitt Merle, MD on 08/09/2020    07/26/2020 Initial Biopsy   Diagnosis Breast, left, needle core biopsy, upper outer 1 o'clock - INVASIVE DUCTAL CARCINOMA - SEE  COMMENT Microscopic Comment Based on the biopsy, the carcinoma appears Nottingham grade 2 of 3 and measures 0.7 cm in greatest linear extent. Prognostic markers (ER/PR/ki-67/HER2) are pending and will be reported in an addendum. Dr. Jeannie Done reviewed the case and agrees with the above diagnosis. These results were called to [facility] on July 27, 2018.   07/26/2020 Receptors her2   PROGNOSTIC INDICATORS Results: IMMUNOHISTOCHEMICAL AND MORPHOMETRIC ANALYSIS PERFORMED MANUALLY The tumor cells are NEGATIVE for Her2 (1+). Estrogen Receptor: 90%, POSITIVE, STRONG STAINING INTENSITY Progesterone Receptor: 80%, POSITIVE, STRONG STAINING INTENSITY Proliferation Marker Ki67: 2%   08/03/2020 Initial Diagnosis   Malignant neoplasm of upper-outer quadrant of left breast in female, estrogen receptor positive ([place])   09/08/2020 Surgery   LEFT BREAST LUMPECTOMY WITH RADIOACTIVE SEED LOCALIZATION by Dr Barry Dienes   09/08/2020 Pathology Results   FINAL MICROSCOPIC DIAGNOSIS:   A. 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  - Margins not involved.  - Invasive carcinoma 0.1 cm from anterior margin.  - Biopsy site and biopsy clip.  - Fibrocystic changes.  - See oncology table.     09/2020 -  Anti-estrogen oral therapy   Tamoxifen 18m daily starting 09/2020   12/27/2020 Cancer Staging   Staging form: Breast, AJCC 8th Edition - Pathologic: Stage Unknown (pT1b, pNX, cM0, G1, ER+, PR+, HER2-) - Signed by BAlla Feeling NP on 12/27/2020 Histologic grading system: 3 grade system    12/29/2020 Survivorship   SCP delivered by LCira Rue NP    01/17/2021 Genetic Testing   Negative genetic testing:  No pathogenic variants detected on the [facility] CancerNext-Expanded + RNAinsight panel. The report date is 01/17/2021.   The CancerNext-Expanded + RNAinsight gene panel offered  by [employer] and includes sequencing and rearrangement analysis for the following 77 genes:  AIP, ALK, APC, ATM, AXIN2, BAP1, BARD1, BLM, BMPR1A, BRCA1, BRCA2, BRIP1, CDC73, CDH1, CDK4, CDKN1B, CDKN2A, CHEK2, CTNNA1, DICER1, FANCC, FH, FLCN, GALNT12, KIF1B, LZTR1, MAX, MEN1, MET, MLH1, MSH2, MSH3, MSH6, MUTYH, NBN, NF1, NF2, NTHL1, PALB2, PHOX2B, PMS2, POT1, PRKAR1A, PTCH1, PTEN, RAD51C, RAD51D, RB1, RECQL, RET, SDHA, SDHAF2, SDHB, SDHC, SDHD, SMAD4, SMARCA4, SMARCB1, SMARCE1, STK11, SUFU, TMEM127, TP53, TSC1, TSC2, VHL and XRCC2 (sequencing and deletion/duplication); EGFR, EGLN1, HOXB13, KIT, MITF, PDGFRA, POLD1 and POLE (sequencing only); EPCAM and GREM1 (deletion/duplication only). RNA data is routinely analyzed for use in variant interpretation for all genes.     CURRENT THERAPY: Tamoxifen 20 mg daily starting 09/2020  INTERVAL HISTORY: [name] returns for follow-up as scheduled, last seen by Dr. Burr Medico 03/31/2021 and Dr. Barry Dienes 05/16/2021.  She feels well in general, Lexapro is helping her mood and anxiety.  She continues tamoxifen without obvious side effects.  Denies new concerns in her breast such as lump/mass, nipple discharge or inversion, or skin change.  She has psoriasis and has noticed 2 new skin eruptions and dry skin.  For the past couple months that she has been having unsteady and nausea spells.  Her blood pressure has been noticeably lower.  She plans to call Dr. Larose Kells to be seen soon.  She has a mammogram scheduled at the end of this week.  All other systems were reviewed with the patient and are negative.  MEDICAL HISTORY:  Past Medical History:  Diagnosis Date   Anxiety and depression    Arthritis    Blood transfusion without reported diagnosis 1989   GB hemorrage    Cataract    Bil/no surgery yet.   Coccyx pain    Secondary to scar tissue from old pressure ulcer after back surgery, Dr. Brantley Stage   Coronary artery disease    Depression 2010   panic attacks and depression   Dizziness    severe: MRI chronic isch. changes and mastoiditis- saw Dr.Mundy(2007)  STATES  SHE STILL HAS EPISODES- ESPECIALLY IF SHE GETS UP TOO QUICKLY AFTER LYING DOWN   Eczema    Family history of breast cancer    Family history of kidney cancer    Family history of lung cancer    Family history of melanoma    Family history of stomach cancer    Family history of throat cancer    GERD (gastroesophageal reflux disease)    Goiter    Headache(784.0)    Hyperlipidemia    Hypertension    Pain    PAIN LOWER BACK AND DOWN RIGHT LEG WITH NUMBNESS, TINGLING RT LEG AND FOOT--STENOSIS   Partial tear of right rotator cuff    & labral tear   S/P cardiac cath 2009   Revealing nonobstructive CAD w/ tubular, discrete 40% mid lesion in the circumflex; discrete 30% proximal lesion in the LAD; luminal irregularities, 35% mid lesion in RCA; and generic 40% mid lesion in the RCA. Medical treatment recommended.   S/P cardiac cath 11/24/2014   Revealing nonobstructive CAD w/ tubular, discrete 40% mid lesion in the circumflex; discrete 30% proximal lesion in the LAD; luminal irregularities, 35% mid lesion in RCA; and generic 40% mid lesion in the RCA. Medical treatment recommended.   Stroke [facility]) 2004   mild TIA    SURGICAL HISTORY: Past Surgical History:  Procedure Laterality Date   ABDOMINAL HYSTERECTOMY     oophorectomy L only   BREAST  CYST ASPIRATION Left    BREAST CYST EXCISION Left    BREAST LUMPECTOMY WITH RADIOACTIVE SEED LOCALIZATION Left 09/08/2020   Procedure: LEFT BREAST LUMPECTOMY WITH RADIOACTIVE SEED LOCALIZATION;  Surgeon: Stark Klein, MD;  Location: [facility];  Service: General;  Laterality: Left;   BUNIONECTOMY  1990s   LEFT   CHOLECYSTECTOMY     HAND SURGERY  1990s   R hand x 2, L hand x 1   LUMBAR LAMINECTOMY/DECOMPRESSION MICRODISCECTOMY N/A 11/13/2012   Procedure: MICRO LUMBAR DECOMPRESSION L4 - L5 AND L2 - L3 2 LEVELS;  Surgeon: Johnn Hai, MD;  Location: [facility];  Service: Orthopedics;  Laterality: N/A;   SHOULDER SURGERY  2006   left      I have reviewed the social history and family history with the patient and they are unchanged from previous note.  ALLERGIES:  is allergic to codeine, hydrocodone, and sertraline hcl.  MEDICATIONS:  Current Outpatient Medications  Medication Sig Dispense Refill   acetaminophen (TYLENOL) 500 MG tablet Take 500 mg by mouth every 6 (six) hours as needed.     ALPRAZolam (XANAX) 0.25 MG tablet Take 1-2 tablets (0.25-0.5 mg total) by mouth at bedtime as needed for anxiety or sleep. 30 tablet 3   amLODipine (NORVASC) 10 MG tablet TAKE 1 TABLET(10 MG) BY MOUTH DAILY (Patient not taking: Reported on 07/18/2021) 90 tablet 1   aspirin 81 MG chewable tablet Chew by mouth daily.     calcium-vitamin D 250-100 MG-UNIT tablet Take 1 tablet by mouth 2 (two) times daily.     escitalopram (LEXAPRO) 20 MG tablet TAKE 1 TABLET(20 MG) BY MOUTH DAILY 90 tablet 1   famotidine (PEPCID) 20 MG tablet Take 1 tablet (20 mg total) by mouth 2 (two) times daily. 180 tablet 3   fluticasone furoate-vilanterol (BREO ELLIPTA) 200-25 MCG/INH AEPB Inhale 1 puff into the lungs daily. 60 each 11   simvastatin (ZOCOR) 20 MG tablet TAKE 1 TABLET(20 MG) BY MOUTH AT BEDTIME 90 tablet 1   tamoxifen (NOLVADEX) 10 MG tablet Take 1 tablet (10 mg total) by mouth daily. 90 tablet 3   No current facility-administered medications for this visit.    PHYSICAL EXAMINATION:  Vitals:   09/28/21 1034  BP: (!) 127/56  Pulse: 95  Resp: 20  Temp: 98.1 F (36.7 C)  SpO2: 95%   Filed Weights   09/28/21 1034  Weight: 146 lb 1.6 oz (66.3 kg)    GENERAL:alert, no distress and comfortable SKIN: No rash EYES: sclera clear NECK: Without mass LYMPH:  no palpable cervical or supraclavicular lymphadenopathy LUNGS:  normal breathing effort HEART: no lower extremity edema ABDOMEN:abdomen soft, non-tender and normal bowel sounds NEURO: alert & oriented x 3 with fluent speech, no focal motor deficits Breast exam: Breasts are symmetrical  without nipple discharge or inversion.  S/p left lumpectomy, incision completely healed with mild scar tissue.  No palpable mass or nodularity in either breast or axilla that I could appreciate.  LABORATORY DATA:  I have reviewed the data as listed CBC Latest Ref Rng & Units 09/28/2021 03/31/2021 10/05/2020  WBC 4.0 - 10.5 K/uL 8.0 6.7 9.4  Hemoglobin 12.0 - 15.0 g/dL 14.3 14.6 15.7(H)  Hematocrit 36.0 - 46.0 % 42.3 42.9 46.5(H)  Platelets 150 - 400 K/uL 103(L) 115(L) 141.0(L)     CMP Latest Ref Rng & Units 09/28/2021 03/31/2021 10/05/2020  Glucose 70 - 99 mg/dL 138(H) 157(H) 135(H)  BUN 8 - 23 mg/dL 14 10 11  Creatinine 0.44 - 1.00 mg/dL 0.74 0.82 0.67  Sodium 135 - 145 mmol/L 140 141 138  Potassium 3.5 - 5.1 mmol/L 3.7 4.2 3.9  Chloride 98 - 111 mmol/L 108 107 102  CO2 22 - 32 mmol/L _0 Calcium 8.9 - 10.3 mg/dL 9.2 9.4 9.5  Total Protein 6.5 - 8.1 g/dL 6.3(L) 6.5 -  Total Bilirubin 0.3 - 1.2 mg/dL 0.5 0.5 -  Alkaline Phos 38 - 126 U/L 68 79 -  AST 15 - 41 U/L 12(L) 16 -  ALT 0 - 44 U/L 6 10 -      RADIOGRAPHIC STUDIES: I have personally reviewed the radiological images as listed and agreed with the findings in the report. No results found.   ASSESSMENT & PLAN: ARIEN MORINE is a 76 y.o. female with    1. Malignant neoplasm of upper-outer quadrant of left breast, Stage IA, c(T1bN0M0), ER+/PR+/HER2-, Grade II -Diagnosed in 07/2020, s/p left breast lumpectomy on 09/08/20 with Dr Barry Dienes. Her Surgical path showed 0.7cm of invasive ductal carcinoma which was completely resected with negative margins, Grade 1.  Given her small tumor size, Oncotype was not recommended and patient opted to forego adjuvant radiation -She started tamoxifen in 09/2020. She is tolerating well with minimal side effects. -[name] is clinically doing well.  Tolerating tamoxifen without significant side effects.  Breast exam is benign.  Labs are unremarkable.  There is no clinical concern for breast cancer  recurrence. -Continue surveillance and tamoxifen -Mammogram scheduled 09/29/2021 -Follow-up in 6 months, or sooner if needed  2.  Nausea and dizzy spells -For past couple months she has been having nausea and dizzy spells -BP lower, on amlodipine -BP today 127/56.  I recommend to hold amlodipine and call PCP   3.  Thrombocytopenia  -Since 07/2020 platelet count has trended down.  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 comorbidities: HTN, HLD, GERD, Anxiety, Chronic back pain (manageable)  -Continue med regimen and PCP follow-up -Lexapro is helping her anxiety/mood.   5. Genetics -Genetic testing was recommend given FMHx of breast cancer and Ovarian cancer: Patient seen by Raquel Sarna on 08/10/20 and previously declined testing -Results were negative.   6. Covid-19+ 08/20/20 -has residual issues with breathing. -high resolution chest CT on 01/20/21 showed interstitial lung disease, with a spectrum of findings considered most compatible with chronic hypersensitivity pneumonitis.   7. Osteopenia  -Her 06/2019 DEXA shows osteopenia at forearm radius with T-score -1.6. She taking Vit D3.   -Previously reviewed the bone strengthening quality of tamoxifen  Plan: -Labs reviewed -No clinical concern for breast cancer recurrence -Continue surveillance and tamoxifen -Hold amlodipine for symptomatic hypotension and follow-up PCP for dizzy/nausea spells -Mammogram 09/29/2021 as scheduled -Surveillance visit in 6 months, or sooner if needed  All questions were answered. The patient knows to call the clinic with any problems, questions or concerns. No barriers to learning was detected. I spent 20 minutes counseling the patient face to face. The total time spent in the appointment was 30 minutes and more than 50% was on counseling and  review of test results     Alla Feeling, NP 09/28/21

## 2021-09-28 ENCOUNTER — Inpatient Hospital Stay: Payer: Medicare Other | Attending: Nurse Practitioner

## 2021-09-28 ENCOUNTER — Inpatient Hospital Stay: Payer: Medicare Other | Admitting: Nurse Practitioner

## 2021-09-28 ENCOUNTER — Other Ambulatory Visit: Payer: Self-pay

## 2021-09-28 ENCOUNTER — Encounter: Payer: Self-pay | Admitting: Nurse Practitioner

## 2021-09-28 VITALS — BP 127/56 | HR 95 | Temp 98.1°F | Resp 20 | Ht 62.0 in | Wt 146.1 lb

## 2021-09-28 DIAGNOSIS — E785 Hyperlipidemia, unspecified: Secondary | ICD-10-CM | POA: Diagnosis not present

## 2021-09-28 DIAGNOSIS — Z885 Allergy status to narcotic agent status: Secondary | ICD-10-CM | POA: Diagnosis not present

## 2021-09-28 DIAGNOSIS — F419 Anxiety disorder, unspecified: Secondary | ICD-10-CM | POA: Diagnosis not present

## 2021-09-28 DIAGNOSIS — I2510 Atherosclerotic heart disease of native coronary artery without angina pectoris: Secondary | ICD-10-CM | POA: Diagnosis not present

## 2021-09-28 DIAGNOSIS — Z803 Family history of malignant neoplasm of breast: Secondary | ICD-10-CM | POA: Insufficient documentation

## 2021-09-28 DIAGNOSIS — I10 Essential (primary) hypertension: Secondary | ICD-10-CM | POA: Insufficient documentation

## 2021-09-28 DIAGNOSIS — Z8041 Family history of malignant neoplasm of ovary: Secondary | ICD-10-CM | POA: Insufficient documentation

## 2021-09-28 DIAGNOSIS — Z170 Estrogen receptor positive status [ER+]: Secondary | ICD-10-CM | POA: Insufficient documentation

## 2021-09-28 DIAGNOSIS — M8580 Other specified disorders of bone density and structure, unspecified site: Secondary | ICD-10-CM | POA: Insufficient documentation

## 2021-09-28 DIAGNOSIS — L409 Psoriasis, unspecified: Secondary | ICD-10-CM | POA: Diagnosis not present

## 2021-09-28 DIAGNOSIS — D696 Thrombocytopenia, unspecified: Secondary | ICD-10-CM | POA: Diagnosis not present

## 2021-09-28 DIAGNOSIS — Z801 Family history of malignant neoplasm of trachea, bronchus and lung: Secondary | ICD-10-CM | POA: Diagnosis not present

## 2021-09-28 DIAGNOSIS — K219 Gastro-esophageal reflux disease without esophagitis: Secondary | ICD-10-CM | POA: Insufficient documentation

## 2021-09-28 DIAGNOSIS — C50412 Malignant neoplasm of upper-outer quadrant of left female breast: Secondary | ICD-10-CM | POA: Insufficient documentation

## 2021-09-28 DIAGNOSIS — Z79899 Other long term (current) drug therapy: Secondary | ICD-10-CM | POA: Diagnosis not present

## 2021-09-28 DIAGNOSIS — Z800 Family history of malignant neoplasm of digestive organs: Secondary | ICD-10-CM | POA: Insufficient documentation

## 2021-09-28 DIAGNOSIS — R42 Dizziness and giddiness: Secondary | ICD-10-CM | POA: Insufficient documentation

## 2021-09-28 DIAGNOSIS — Z808 Family history of malignant neoplasm of other organs or systems: Secondary | ICD-10-CM | POA: Diagnosis not present

## 2021-09-28 DIAGNOSIS — Z8051 Family history of malignant neoplasm of kidney: Secondary | ICD-10-CM | POA: Diagnosis not present

## 2021-09-28 DIAGNOSIS — Z8673 Personal history of transient ischemic attack (TIA), and cerebral infarction without residual deficits: Secondary | ICD-10-CM | POA: Diagnosis not present

## 2021-09-28 DIAGNOSIS — Z8616 Personal history of COVID-19: Secondary | ICD-10-CM | POA: Diagnosis not present

## 2021-09-28 DIAGNOSIS — F32A Depression, unspecified: Secondary | ICD-10-CM | POA: Insufficient documentation

## 2021-09-28 DIAGNOSIS — G8929 Other chronic pain: Secondary | ICD-10-CM | POA: Diagnosis not present

## 2021-09-28 DIAGNOSIS — Z9049 Acquired absence of other specified parts of digestive tract: Secondary | ICD-10-CM | POA: Diagnosis not present

## 2021-09-28 LAB — CMP (CANCER CENTER ONLY)
ALT: 6 U/L (ref 0–44)
AST: 12 U/L — ABNORMAL LOW (ref 15–41)
Albumin: 3.9 g/dL (ref 3.5–5.0)
Alkaline Phosphatase: 68 U/L (ref 38–126)
Anion gap: 7 (ref 5–15)
BUN: 14 mg/dL (ref 8–23)
CO2: 25 mmol/L (ref 22–32)
Calcium: 9.2 mg/dL (ref 8.9–10.3)
Chloride: 108 mmol/L (ref 98–111)
Creatinine: 0.74 mg/dL (ref 0.44–1.00)
GFR, Estimated: >60 mL/min (ref >60)
Glucose, Bld: 138 mg/dL — ABNORMAL HIGH (ref 70–99)
Potassium: 3.7 mmol/L (ref 3.5–5.1)
Sodium: 140 mmol/L (ref 135–145)
Total Bilirubin: 0.5 mg/dL (ref 0.3–1.2)
Total Protein: 6.3 g/dL — ABNORMAL LOW (ref 6.5–8.1)

## 2021-09-28 LAB — CBC WITH DIFFERENTIAL (CANCER CENTER ONLY)
Abs Immature Granulocytes: 0.02 10*3/uL (ref 0.00–0.07)
Basophils Absolute: 0 10*3/uL (ref 0.0–0.1)
Basophils Relative: 0 %
Eosinophils Absolute: 0.1 10*3/uL (ref 0.0–0.5)
Eosinophils Relative: 1 %
HCT: 42.3 % (ref 36.0–46.0)
Hemoglobin: 14.3 g/dL (ref 12.0–15.0)
Immature Granulocytes: 0 %
Lymphocytes Relative: 22 %
Lymphs Abs: 1.7 10*3/uL (ref 0.7–4.0)
MCH: 32.8 pg (ref 26.0–34.0)
MCHC: 33.8 g/dL (ref 30.0–36.0)
MCV: 97 fL (ref 80.0–100.0)
Monocytes Absolute: 0.6 10*3/uL (ref 0.1–1.0)
Monocytes Relative: 7 %
Neutro Abs: 5.6 10*3/uL (ref 1.7–7.7)
Neutrophils Relative %: 70 %
Platelet Count: 103 10*3/uL — ABNORMAL LOW (ref 150–400)
RBC: 4.36 MIL/uL (ref 3.87–5.11)
RDW: 13.1 % (ref 11.5–15.5)
WBC Count: 8 10*3/uL (ref 4.0–10.5)
nRBC: 0 % (ref 0.0–0.2)

## 2021-09-28 MED ORDER — TAMOXIFEN CITRATE 10 MG PO TABS: 10.0000 mg | ORAL_TABLET | Freq: Every day | ORAL | 3 refills | Status: DC

## 2021-09-29 ENCOUNTER — Ambulatory Visit
Admission: RE | Admit: 2021-09-29 | Discharge: 2021-09-29 | Disposition: A | Discharge status: Home / Self Care | Payer: Medicare Other | Source: Ambulatory Visit | Attending: Hematology | Admitting: Hematology

## 2021-09-29 ENCOUNTER — Telehealth: Payer: Self-pay | Admitting: Nurse Practitioner

## 2021-09-29 DIAGNOSIS — R922 Inconclusive mammogram: Secondary | ICD-10-CM | POA: Diagnosis not present

## 2021-09-29 DIAGNOSIS — C50412 Malignant neoplasm of upper-outer quadrant of left female breast: Secondary | ICD-10-CM

## 2021-09-29 DIAGNOSIS — Z170 Estrogen receptor positive status [ER+]: Secondary | ICD-10-CM

## 2021-09-29 NOTE — Telephone Encounter (Signed)
Scheduled follow-up appointment per 3/9 los. Patient is aware. ?

## 2021-10-04 ENCOUNTER — Telehealth: Payer: Self-pay | Admitting: Internal Medicine

## 2021-10-04 NOTE — Telephone Encounter (Signed)
Spoke w/ Pt- she saw oncology on 09/28/21 and BP that day was 151/56, they had her go ahead and cut amlodipine 10g to 1/2 tablet. Her BP the last few days continue to drop, 115/76, 117/58, 108/50, and 127/49. Informed I'd make PCP aware and call back with instructions.  ?

## 2021-10-04 NOTE — Telephone Encounter (Signed)
Please advise.  ? ?BP Readings from Last 3 Encounters:  ?09/28/21 (!) 127/56  ?07/18/21 136/88  ?03/31/21 126/63  ? ? ?Last 3 BP's at OV above.  ?

## 2021-10-04 NOTE — Telephone Encounter (Signed)
Pt states she has not checked her bp this morning but Monday was reflecting 108/50 ? ? ?Please advise  ?

## 2021-10-04 NOTE — Telephone Encounter (Signed)
At the last visit, I recommended to go back on amlodipine.  I assume she is taking it. ?Recommend to decrease amlodipine 10 mg to half tablet daily. ?Monitor BPs, call if they are higher than 135/85. ?

## 2021-10-04 NOTE — Telephone Encounter (Signed)
Spoke w/ Pt- informed of recommendations. Med list updated.  ?

## 2021-10-04 NOTE — Telephone Encounter (Signed)
Okay, only 1 reading has been low.  Continue with 10 mg half tablet daily, check BPs frequently, call if consistently less than 110/60. ?

## 2021-10-12 ENCOUNTER — Other Ambulatory Visit: Payer: Self-pay | Admitting: Internal Medicine

## 2021-10-13 ENCOUNTER — Ambulatory Visit: Payer: Medicare Other

## 2021-10-17 ENCOUNTER — Ambulatory Visit (INDEPENDENT_AMBULATORY_CARE_PROVIDER_SITE_OTHER): Payer: Medicare Other

## 2021-10-17 ENCOUNTER — Other Ambulatory Visit: Payer: Self-pay

## 2021-10-17 DIAGNOSIS — Z0000 Encounter for general adult medical examination without abnormal findings: Secondary | ICD-10-CM

## 2021-10-17 NOTE — Patient Instructions (Signed)
Ms. Ingram , ?Thank you for taking time to come for your Medicare Wellness Visit. I appreciate your ongoing commitment to your health goals. Please review the following plan we discussed and let me know if I can assist you in the future.  ? ?Screening recommendations/referrals: ?Colonoscopy: no longer required  ?Mammogram: Done 09/29/21  repeat every year  ?Bone Density: Done 07/02/19 repeat every 5 years  ?Recommended yearly ophthalmology/optometry visit for glaucoma screening and checkup ?Recommended yearly dental visit for hygiene and checkup ? ?Vaccinations: ?Influenza vaccine: Due and discussed  ?Pneumococcal vaccine: Up to date ?Tdap vaccine: Due and discussed  ?Shingles vaccine: Shingrix discussed. Please contact your pharmacy for coverage information.    ?Covid-19:postponed  ? ?Advanced directives: Please bring a copy of your health care power of attorney and living will to the office at your convenience. ? ?Conditions/risks identified: monitor blood pressure  ? ?Next appointment: Follow up in one year for your annual wellness visit  ? ? ?Preventive Care 78 Years and Older, Female ?Preventive care refers to lifestyle choices and visits with your health care provider that can promote health and wellness. ?What does preventive care include? ?A yearly physical exam. This is also called an annual well check. ?Dental exams once or twice a year. ?Routine eye exams. Ask your health care provider how often you should have your eyes checked. ?Personal lifestyle choices, including: ?Daily care of your teeth and gums. ?Regular physical activity. ?Eating a healthy diet. ?Avoiding tobacco and drug use. ?Limiting alcohol use. ?Practicing safe sex. ?Taking low-dose aspirin every day. ?Taking vitamin and mineral supplements as recommended by your health care provider. ?What happens during an annual well check? ?The services and screenings done by your health care provider during your annual well check will depend on your  age, overall health, lifestyle risk factors, and family history of disease. ?Counseling  ?Your health care provider may ask you questions about your: ?Alcohol use. ?Tobacco use. ?Drug use. ?Emotional well-being. ?Home and relationship well-being. ?Sexual activity. ?Eating habits. ?History of falls. ?Memory and ability to understand (cognition). ?Work and work Statistician. ?Reproductive health. ?Screening  ?You may have the following tests or measurements: ?Height, weight, and BMI. ?Blood pressure. ?Lipid and cholesterol levels. These may be checked every 5 years, or more frequently if you are over 78 years old. ?Skin check. ?Lung cancer screening. You may have this screening every year starting at age 78 if you have a 30-pack-year history of smoking and currently smoke or have quit within the past 15 years. ?Fecal occult blood test (FOBT) of the stool. You may have this test every year starting at age 78. ?Flexible sigmoidoscopy or colonoscopy. You may have a sigmoidoscopy every 5 years or a colonoscopy every 10 years starting at age 78. ?Hepatitis C blood test. ?Hepatitis B blood test. ?Sexually transmitted disease (STD) testing. ?Diabetes screening. This is done by checking your blood sugar (glucose) after you have not eaten for a while (fasting). You may have this done every 1-3 years. ?Bone density scan. This is done to screen for osteoporosis. You may have this done starting at age 78. ?Mammogram. This may be done every 1-2 years. Talk to your health care provider about how often you should have regular mammograms. ?Talk with your health care provider about your test results, treatment options, and if necessary, the need for more tests. ?Vaccines  ?Your health care provider may recommend certain vaccines, such as: ?Influenza vaccine. This is recommended every year. ?Tetanus, diphtheria, and acellular pertussis (Tdap,  Td) vaccine. You may need a Td booster every 10 years. ?Zoster vaccine. You may need this after  age 13. ?Pneumococcal 13-valent conjugate (PCV13) vaccine. One dose is recommended after age 78. ?Pneumococcal polysaccharide (PPSV23) vaccine. One dose is recommended after age 78. ?Talk to your health care provider about which screenings and vaccines you need and how often you need them. ?This information is not intended to replace advice given to you by your health care provider. Make sure you discuss any questions you have with your health care provider. ?Document Released: 08/05/2015 Document Revised: 03/28/2016 Document Reviewed: 05/10/2015 ?Elsevier Interactive Patient Education ? 2017 [employer]. ? ?Fall Prevention in the Home ?Falls can cause injuries. They can happen to people of all ages. There are many things you can do to make your home safe and to help prevent falls. ?What can I do on the outside of my home? ?Regularly fix the edges of walkways and driveways and fix any cracks. ?Remove anything that might make you trip as you walk through a door, such as a raised step or threshold. ?Trim any bushes or trees on the path to your home. ?Use bright outdoor lighting. ?Clear any walking paths of anything that might make someone trip, such as rocks or tools. ?Regularly check to see if handrails are loose or broken. Make sure that both sides of any steps have handrails. ?Any raised decks and porches should have guardrails on the edges. ?Have any leaves, snow, or ice cleared regularly. ?Use sand or salt on walking paths during winter. ?Clean up any spills in your garage right away. This includes oil or grease spills. ?What can I do in the bathroom? ?Use night lights. ?Install grab bars by the toilet and in the tub and shower. Do not use towel bars as grab bars. ?Use non-skid mats or decals in the tub or shower. ?If you need to sit down in the shower, use a plastic, non-slip stool. ?Keep the floor dry. Clean up any water that spills on the floor as soon as it happens. ?Remove soap buildup in the tub or shower  regularly. ?Attach bath mats securely with double-sided non-slip rug tape. ?Do not have throw rugs and other things on the floor that can make you trip. ?What can I do in the bedroom? ?Use night lights. ?Make sure that you have a light by your bed that is easy to reach. ?Do not use any sheets or blankets that are too big for your bed. They should not hang down onto the floor. ?Have a firm chair that has side arms. You can use this for support while you get dressed. ?Do not have throw rugs and other things on the floor that can make you trip. ?What can I do in the kitchen? ?Clean up any spills right away. ?Avoid walking on wet floors. ?Keep items that you use a lot in easy-to-reach places. ?If you need to reach something above you, use a strong step stool that has a grab bar. ?Keep electrical cords out of the way. ?Do not use floor polish or wax that makes floors slippery. If you must use wax, use non-skid floor wax. ?Do not have throw rugs and other things on the floor that can make you trip. ?What can I do with my stairs? ?Do not leave any items on the stairs. ?Make sure that there are handrails on both sides of the stairs and use them. Fix handrails that are broken or loose. Make sure that handrails are as  long as the stairways. ?Check any carpeting to make sure that it is firmly attached to the stairs. Fix any carpet that is loose or worn. ?Avoid having throw rugs at the top or bottom of the stairs. If you do have throw rugs, attach them to the floor with carpet tape. ?Make sure that you have a light switch at the top of the stairs and the bottom of the stairs. If you do not have them, ask someone to add them for you. ?What else can I do to help prevent falls? ?Wear shoes that: ?Do not have high heels. ?Have rubber bottoms. ?Are comfortable and fit you well. ?Are closed at the toe. Do not wear sandals. ?If you use a stepladder: ?Make sure that it is fully opened. Do not climb a closed stepladder. ?Make sure that  both sides of the stepladder are locked into place. ?Ask someone to hold it for you, if possible. ?Clearly mark and make sure that you can see: ?Any grab bars or handrails. ?First and last steps. ?Wh

## 2021-10-17 NOTE — Progress Notes (Addendum)
Virtual Visit via Telephone Note ? ?I connected with  [name] on 10/17/21 at  1:00 PM EDT by telephone and verified that I am speaking with the correct person using two identifiers. ? ?Medicare Annual Wellness visit completed telephonically due to Covid-19 pandemic.  ? ?Persons participating in this call: This Health Coach and this patient.  ? ?Location: ?Patient: home ?Provider: office ?  ?I discussed the limitations, risks, security and privacy concerns of performing an evaluation and management service by telephone and the availability of in person appointments. The patient expressed understanding and agreed to proceed. ? ?Unable to perform video visit due to video visit attempted and failed and/or patient does not have video capability.  ? ?Some vital signs may be absent or patient reported.  ? ?Jenny Brace, LPN ? ? ?Subjective:  ? [name] is a 76 y.o. female who presents for Medicare Annual (Subsequent) preventive examination. ? ?Review of Systems    ? ?Cardiac Risk Factors include: advanced age (>36mn, >>15women);dyslipidemia;hypertension ? ?   ?Objective:  ?  ?There were no vitals filed for this visit. ?There is no height or weight on file to calculate BMI. ? ? ?  10/17/2021  ?  1:07 PM 12/29/2020  ? 12:35 PM 09/08/2020  ? 11:40 AM 08/18/2020  ?  1:09 PM 08/10/2020  ?  4:49 PM 07/21/2020  ?  1:34 PM 06/23/2019  ? 10:56 AM  ?Advanced Directives  ?Does Patient Have a Medical Advance Directive? Yes No Yes Yes No No No  ?Type of AScientist, physiologicalof [place] will     ?Does patient want to make changes to medical advance directive? Yes (MAU/Ambulatory/Procedural Areas - Information given) No - Patient declined No - Patient declined No - Patient declined  Yes (MAU/Ambulatory/Procedural Areas - Information given)   ?Copy of [place]?   No - copy requested      ?Would patient like information on creating a medical  advance directive?  No - Patient declined     No - Patient declined  ? ? ?Current Medications (verified) ?Outpatient Encounter Medications as of 10/17/2021  ?Medication Sig  ? acetaminophen (TYLENOL) 500 MG tablet Take 500 mg by mouth every 6 (six) hours as needed.  ? ALPRAZolam (XANAX) 0.25 MG tablet Take 1-2 tablets (0.25-0.5 mg total) by mouth at bedtime as needed for anxiety or sleep.  ? amLODipine (NORVASC) 10 MG tablet TAKE 1 TABLET(10 MG) BY MOUTH DAILY  ? aspirin 81 MG chewable tablet Chew by mouth daily.  ? calcium-vitamin D 250-100 MG-UNIT tablet Take 1 tablet by mouth 2 (two) times daily.  ? escitalopram (LEXAPRO) 20 MG tablet TAKE 1 TABLET(20 MG) BY MOUTH DAILY  ? famotidine (PEPCID) 20 MG tablet Take 1 tablet (20 mg total) by mouth 2 (two) times daily.  ? fluticasone furoate-vilanterol (BREO ELLIPTA) 200-25 MCG/INH AEPB Inhale 1 puff into the lungs daily.  ? simvastatin (ZOCOR) 20 MG tablet TAKE 1 TABLET(20 MG) BY MOUTH AT BEDTIME  ? tamoxifen (NOLVADEX) 10 MG tablet Take 1 tablet (10 mg total) by mouth daily.  ? ?No facility-administered encounter medications on file as of 10/17/2021.  ? ? ?Allergies (verified) ?Codeine, Hydrocodone, and Sertraline hcl  ? ?History: ?Past Medical History:  ?Diagnosis Date  ? Anxiety and depression   ? Arthritis   ? Blood transfusion without reported diagnosis 1989  ? GB hemorrage   ? Cataract   ?  Bil/no surgery yet.  ? Coccyx pain   ? Secondary to scar tissue from old pressure ulcer after back surgery, Dr. Brantley Stage  ? Coronary artery disease   ? Depression 2010  ? panic attacks and depression  ? Dizziness   ? severe: MRI chronic isch. changes and mastoiditis- saw Dr.Mundy(2007)  STATES SHE STILL HAS EPISODES- ESPECIALLY IF SHE GETS UP TOO QUICKLY AFTER LYING DOWN  ? Eczema   ? Family history of breast cancer   ? Family history of kidney cancer   ? Family history of lung cancer   ? Family history of melanoma   ? Family history of stomach cancer   ? Family history of throat  cancer   ? GERD (gastroesophageal reflux disease)   ? Goiter   ? Headache(784.0)   ? Hyperlipidemia   ? Hypertension   ? Pain   ? PAIN LOWER BACK AND DOWN RIGHT LEG WITH NUMBNESS, TINGLING RT LEG AND FOOT--STENOSIS  ? Partial tear of right rotator cuff   ? & labral tear  ? S/P cardiac cath 2009  ? Revealing nonobstructive CAD w/ tubular, discrete 40% mid lesion in the circumflex; discrete 30% proximal lesion in the LAD; luminal irregularities, 35% mid lesion in RCA; and generic 40% mid lesion in the RCA. Medical treatment recommended.  ? S/P cardiac cath 11/24/2014  ? Revealing nonobstructive CAD w/ tubular, discrete 40% mid lesion in the circumflex; discrete 30% proximal lesion in the LAD; luminal irregularities, 35% mid lesion in RCA; and generic 40% mid lesion in the RCA. Medical treatment recommended.  ? Stroke [facility]) 2004  ? mild TIA  ? ?Past Surgical History:  ?Procedure Laterality Date  ? ABDOMINAL HYSTERECTOMY    ? oophorectomy L only  ? BREAST CYST ASPIRATION Left   ? BREAST CYST EXCISION Left   ? BREAST LUMPECTOMY    ? BREAST LUMPECTOMY WITH RADIOACTIVE SEED LOCALIZATION Left 09/08/2020  ? Procedure: LEFT BREAST LUMPECTOMY WITH RADIOACTIVE SEED LOCALIZATION;  Surgeon: Stark Klein, MD;  Location: [facility];  Service: General;  Laterality: Left;  ? BUNIONECTOMY  1990s  ? LEFT  ? CHOLECYSTECTOMY    ? HAND SURGERY  1990s  ? R hand x 2, L hand x 1  ? LUMBAR LAMINECTOMY/DECOMPRESSION MICRODISCECTOMY N/A 11/13/2012  ? Procedure: MICRO LUMBAR DECOMPRESSION L4 - L5 AND L2 - L3 2 LEVELS;  Surgeon: Johnn Hai, MD;  Location: [facility];  Service: Orthopedics;  Laterality: N/A;  ? SHOULDER SURGERY  2006  ? left   ? ?Family History  ?Problem Relation Age of Onset  ? Throat cancer Other   ?     cousin  ? Heart attack Half-Brother   ?     2 brother s, CABG  ? Heart disease Half-Brother   ? Lung cancer Mother   ?     dx late 88s, cousin  ? Melanoma Mother   ?     multiple, first diagnosed 46s  ? Lung  cancer Brother   ? Throat cancer Brother 51  ? Cataracts Father   ? Heart disease Half-Brother   ? Kidney cancer Maternal Aunt 3  ? Cancer Maternal Grandfather   ?     NOS, dx 39s  ? Stomach cancer Maternal Uncle   ? Breast cancer Cousin   ?     dx >50, maternal first cousin  ? Breast cancer Cousin   ?     dx >50, maternal first cousin  ? Colon cancer Neg  Hx   ? Diabetes Neg Hx   ? ?Social History  ? ?Socioeconomic History  ? Marital status: Widowed  ?  Spouse name: Not on file  ? Number of children: 4  ? Years of education: Not on file  ? Highest education level: Not on file  ?Occupational History  ? Occupation: retired   ?  Employer: RETIRED  ?Tobacco Use  ? Smoking status: Former  ?  Packs/day: 1.00  ?  Years: 20.00  ?  Pack years: 20.00  ?  Types: Cigarettes  ?  Quit date: 2000  ?  Years since quitting: 23.2  ? Smokeless tobacco: Never  ? Tobacco comments:  ?  quit aprox 2000 after 30 years, 1 to 1.5 ppd  ?Substance and Sexual Activity  ? Alcohol use: No  ? Drug use: No  ? Sexual activity: Not Currently  ?Other Topics Concern  ? Not on file  ?Social History Narrative  ? Lost husband [name]) 12/2018; 2 daughters. 2 sons   ? youngest son lives w/her   ? ?Social Determinants of Health  ? ?Financial Resource Strain: Low Risk   ? Difficulty of Paying Living Expenses: Not hard at all  ?Food Insecurity: No Food Insecurity  ? Worried About Charity fundraiser in the Last Year: Never true  ? Ran Out of Food in the Last Year: Never true  ?Transportation Needs: No Transportation Needs  ? Lack of Transportation (Medical): No  ? Lack of Transportation (Non-Medical): No  ?Physical Activity: Insufficiently Active  ? Days of Exercise per Week: 3 days  ? Minutes of Exercise per Session: 20 min  ?Stress: Stress Concern Present  ? Feeling of Stress : To some extent  ?Social Connections: Unknown  ? Frequency of Communication with Friends and Family: More than three times a week  ? Frequency of Social Gatherings with Friends and  Family: Three times a week  ? Attends Religious Services: 1 to 4 times per year  ? Active Member of Clubs or Organizations: No  ? Attends Archivist Meetings: Never  ? Marital Status: Not on file  ?

## 2021-10-18 ENCOUNTER — Telehealth: Payer: Self-pay | Admitting: Internal Medicine

## 2021-10-18 MED ORDER — ALPRAZOLAM 0.25 MG PO TABS: 0.2500 mg | ORAL_TABLET | Freq: Every evening | ORAL | 1 refills | Status: DC | PRN

## 2021-10-18 NOTE — Telephone Encounter (Signed)
Medication: ALPRAZolam (XANAX) 0.25 MG tablet  ? ?Has the patient contacted their pharmacy? No. ? ? ?Preferred Pharmacy: [facility] [place], [address] [facility]  ?[place] [address] [address]  ?Phone:  [phone]  Fax:  [phone]  ? ?

## 2021-10-18 NOTE — Telephone Encounter (Signed)
Requesting: alprazolam 0.'25mg'$   ?Contract:10/26/2020 ?UDS: 04/28/2019 ?Last Visit: 07/18/2021 ?Next Visit: 11/23/2021 ?Last Refill: 02/01/21 #30 and 3RF ? ?Please Advise ? ?

## 2021-10-18 NOTE — Telephone Encounter (Signed)
PDMP okay, Rx sent 

## 2021-11-23 ENCOUNTER — Ambulatory Visit (INDEPENDENT_AMBULATORY_CARE_PROVIDER_SITE_OTHER): Payer: Medicare Other | Admitting: Internal Medicine

## 2021-11-23 ENCOUNTER — Encounter: Payer: Self-pay | Admitting: Internal Medicine

## 2021-11-23 ENCOUNTER — Telehealth: Payer: Self-pay | Admitting: *Deleted

## 2021-11-23 VITALS — BP 138/72 | HR 74 | Temp 97.8°F | Resp 18 | Ht 62.0 in | Wt 146.1 lb

## 2021-11-23 DIAGNOSIS — T452X1A Poisoning by vitamins, accidental (unintentional), initial encounter: Secondary | ICD-10-CM | POA: Diagnosis not present

## 2021-11-23 DIAGNOSIS — Z0001 Encounter for general adult medical examination with abnormal findings: Secondary | ICD-10-CM | POA: Diagnosis not present

## 2021-11-23 DIAGNOSIS — R739 Hyperglycemia, unspecified: Secondary | ICD-10-CM

## 2021-11-23 DIAGNOSIS — I10 Essential (primary) hypertension: Secondary | ICD-10-CM | POA: Diagnosis not present

## 2021-11-23 DIAGNOSIS — M25552 Pain in left hip: Secondary | ICD-10-CM

## 2021-11-23 DIAGNOSIS — R7989 Other specified abnormal findings of blood chemistry: Secondary | ICD-10-CM | POA: Diagnosis not present

## 2021-11-23 DIAGNOSIS — E785 Hyperlipidemia, unspecified: Secondary | ICD-10-CM | POA: Diagnosis not present

## 2021-11-23 DIAGNOSIS — Z79899 Other long term (current) drug therapy: Secondary | ICD-10-CM | POA: Diagnosis not present

## 2021-11-23 DIAGNOSIS — F419 Anxiety disorder, unspecified: Secondary | ICD-10-CM

## 2021-11-23 DIAGNOSIS — F32A Depression, unspecified: Secondary | ICD-10-CM | POA: Diagnosis not present

## 2021-11-23 DIAGNOSIS — Z0000 Encounter for general adult medical examination without abnormal findings: Secondary | ICD-10-CM

## 2021-11-23 LAB — LIPID PANEL
Cholesterol: 138 mg/dL (ref 0–200)
HDL: 52.8 mg/dL (ref >39.00)
LDL Cholesterol: 53 mg/dL (ref 0–99)
NonHDL: 84.83
Total CHOL/HDL Ratio: 3
Triglycerides: 160 mg/dL — ABNORMAL HIGH (ref 0.0–149.0)
VLDL: 32 mg/dL (ref 0.0–40.0)

## 2021-11-23 LAB — VITAMIN D 25 HYDROXY (VIT D DEFICIENCY, FRACTURES): VITD: 19.64 ng/mL — ABNORMAL LOW (ref 30.00–100.00)

## 2021-11-23 LAB — T4, FREE: Free T4: 0.73 ng/dL (ref 0.60–1.60)

## 2021-11-23 LAB — TSH: TSH: 10.39 u[IU]/mL — ABNORMAL HIGH (ref 0.35–5.50)

## 2021-11-23 LAB — HEMOGLOBIN A1C: Hgb A1c MFr Bld: 5.4 % (ref 4.6–6.5)

## 2021-11-23 MED ORDER — ESCITALOPRAM OXALATE 20 MG PO TABS: 10.0000 mg | ORAL_TABLET | Freq: Every day | ORAL | Status: DC

## 2021-11-23 NOTE — Progress Notes (Signed)
? ?Subjective:  ? ? Patient ID: [name], female    DOB: 11/29/1945, 76 y.o.   MRN: [id number] ? ?DOS:  11/23/2021 ?Type of visit - description: CPX ?Here for CPX ?All chronic medical problems were reviewed ?C/o pain at the left hip, going from the front to the back.  Some back pain.  Some pain at the lateral side of the left leg.  Symptoms increased by walking.  Denies any rash or bulging area at the groin. ?Ambulatory BPs okay, occasionally diastolic BP slightly low. ?In previous years complaining of dizziness, that has improved ? ? ?Review of Systems ? ?Other than above, a 14 point review of systems is negative  ?  ? ? ?Past Medical History:  ?Diagnosis Date  ? Anxiety and depression   ? Arthritis   ? Blood transfusion without reported diagnosis 1989  ? GB hemorrage   ? Cataract   ? Bil/no surgery yet.  ? Coccyx pain   ? Secondary to scar tissue from old pressure ulcer after back surgery, Dr. Brantley Stage  ? Coronary artery disease   ? Depression 2010  ? panic attacks and depression  ? Dizziness   ? severe: MRI chronic isch. changes and mastoiditis- saw Dr.Mundy(2007)  STATES SHE STILL HAS EPISODES- ESPECIALLY IF SHE GETS UP TOO QUICKLY AFTER LYING DOWN  ? Eczema   ? Family history of breast cancer   ? Family history of kidney cancer   ? Family history of lung cancer   ? Family history of melanoma   ? Family history of stomach cancer   ? Family history of throat cancer   ? GERD (gastroesophageal reflux disease)   ? Goiter   ? Headache(784.0)   ? Hyperlipidemia   ? Hypertension   ? Pain   ? PAIN LOWER BACK AND DOWN RIGHT LEG WITH NUMBNESS, TINGLING RT LEG AND FOOT--STENOSIS  ? Partial tear of right rotator cuff   ? & labral tear  ? S/P cardiac cath 2009  ? Revealing nonobstructive CAD w/ tubular, discrete 40% mid lesion in the circumflex; discrete 30% proximal lesion in the LAD; luminal irregularities, 35% mid lesion in RCA; and generic 40% mid lesion in the RCA. Medical treatment recommended.  ? S/P cardiac cath  11/24/2014  ? Revealing nonobstructive CAD w/ tubular, discrete 40% mid lesion in the circumflex; discrete 30% proximal lesion in the LAD; luminal irregularities, 35% mid lesion in RCA; and generic 40% mid lesion in the RCA. Medical treatment recommended.  ? Stroke [facility]) 2004  ? mild TIA  ? ? ?Past Surgical History:  ?Procedure Laterality Date  ? ABDOMINAL HYSTERECTOMY    ? oophorectomy L only  ? BREAST CYST ASPIRATION Left   ? BREAST CYST EXCISION Left   ? BREAST LUMPECTOMY    ? BREAST LUMPECTOMY WITH RADIOACTIVE SEED LOCALIZATION Left 09/08/2020  ? Procedure: LEFT BREAST LUMPECTOMY WITH RADIOACTIVE SEED LOCALIZATION;  Surgeon: Stark Klein, MD;  Location: [place];  Service: General;  Laterality: Left;  ? BUNIONECTOMY  1990s  ? LEFT  ? CHOLECYSTECTOMY    ? HAND SURGERY  1990s  ? R hand x 2, L hand x 1  ? LUMBAR LAMINECTOMY/DECOMPRESSION MICRODISCECTOMY N/A 11/13/2012  ? Procedure: MICRO LUMBAR DECOMPRESSION L4 - L5 AND L2 - L3 2 LEVELS;  Surgeon: Johnn Hai, MD;  Location: [facility];  Service: Orthopedics;  Laterality: N/A;  ? SHOULDER SURGERY  2006  ? left   ? ?Social History  ? ?Socioeconomic History  ?  Marital status: Widowed  ?  Spouse name: Not on file  ? Number of children: 4  ? Years of education: Not on file  ? Highest education level: Not on file  ?Occupational History  ? Occupation: retired   ?  Employer: RETIRED  ?Tobacco Use  ? Smoking status: Former  ?  Packs/day: 1.00  ?  Years: 20.00  ?  Pack years: 20.00  ?  Types: Cigarettes  ?  Quit date: 2000  ?  Years since quitting: 23.3  ? Smokeless tobacco: Never  ? Tobacco comments:  ?  quit aprox 2000 after 30 years, 1 to 1.5 ppd  ?Substance and Sexual Activity  ? Alcohol use: No  ? Drug use: No  ? Sexual activity: Not Currently  ?Other Topics Concern  ? Not on file  ?Social History Narrative  ? Lost husband [name]) 12/2018; 2 daughters. 2 sons   ? youngest son lives w/her   ? ?Social Determinants of Health  ? ?Financial Resource Strain:  Low Risk   ? Difficulty of Paying Living Expenses: Not hard at all  ?Food Insecurity: No Food Insecurity  ? Worried About Charity fundraiser in the Last Year: Never true  ? Ran Out of Food in the Last Year: Never true  ?Transportation Needs: No Transportation Needs  ? Lack of Transportation (Medical): No  ? Lack of Transportation (Non-Medical): No  ?Physical Activity: Insufficiently Active  ? Days of Exercise per Week: 3 days  ? Minutes of Exercise per Session: 20 min  ?Stress: Stress Concern Present  ? Feeling of Stress : To some extent  ?Social Connections: Unknown  ? Frequency of Communication with Friends and Family: More than three times a week  ? Frequency of Social Gatherings with Friends and Family: Three times a week  ? Attends Religious Services: 1 to 4 times per year  ? Active Member of Clubs or Organizations: No  ? Attends Archivist Meetings: Never  ? Marital Status: Not on file  ?Intimate Partner Violence: Not At Risk  ? Fear of Current or Ex-Partner: No  ? Emotionally Abused: No  ? Physically Abused: No  ? Sexually Abused: No  ? ? ?Current Outpatient Medications  ?Medication Instructions  ? acetaminophen (TYLENOL) 500 mg, Oral, Every 6 hours PRN  ? ALPRAZolam (XANAX) 0.25-0.5 mg, Oral, At bedtime PRN  ? amLODipine (NORVASC) 10 MG tablet TAKE 1 TABLET(10 MG) BY MOUTH DAILY  ? aspirin 81 MG chewable tablet Oral, Daily  ? calcium-vitamin D 250-100 MG-UNIT tablet 1 tablet, Oral, 2 times daily  ? escitalopram (LEXAPRO) 10 mg, Oral, Daily  ? famotidine (PEPCID) 20 mg, Oral, 2 times daily  ? fluticasone furoate-vilanterol (BREO ELLIPTA) 200-25 MCG/INH AEPB 1 puff, Inhalation, Daily  ? simvastatin (ZOCOR) 20 MG tablet TAKE 1 TABLET(20 MG) BY MOUTH AT BEDTIME  ? tamoxifen (NOLVADEX) 10 mg, Oral, Daily  ? traMADol (ULTRAM) 50 mg, Oral, Daily PRN  ? ? ?   ?Objective:  ? Physical Exam ?BP 138/72 (BP Location: Left Arm, Patient Position: Sitting, Cuff Size: Small)   Pulse 74   Temp 97.8 ?F (36.6 ?C)  (Oral)   Resp 18   Ht '5\' 2"'$  (1.575 m)   Wt 146 lb 2 oz (66.3 kg)   SpO2 96%   BMI 26.73 kg/m?  ?General: ?Well developed, NAD, BMI noted ?Neck: No  thyromegaly  ?HEENT:  ?Normocephalic . Face symmetric, atraumatic ?Lungs:  ?CTA B ?Normal respiratory effort, no intercostal retractions, no accessory  muscle use. ?Heart: RRR,  no murmur.  ?Abdomen:  ?Not distended, soft, non-tender. No rebound or rigidity. ?Groins: No hernias noted ?Lower extremities: no pretibial edema bilaterally ?MSK: ?Hip rotation is normal bilaterally, + pain on the left. ?No trochanteric bursa pain to palpation ?Skin: Exposed areas without rash. Not pale. Not jaundice ?Neurologic:  ?alert & oriented X3.  ?Speech normal, gait appropriate for age and unassisted ?Strength symmetric and appropriate for age.  ?Psych: ?Cognition and judgment appear intact.  ?Cooperative with normal attention span and concentration.  ?Behavior appropriate. ?No anxious or depressed appearing. ? ?   ?Assessment   ? ? Assessment  ?Prediabetes ?HTN ?Hyperlipidemia ?Anxiety depression, insomnia- xanax rx by pcp ?GERD ?CV: ?TIA? (2004) ?CAD: catheterization 2009 and 2016: Non-obstructive CAD, 40% blockages, see report. rx medical treatment ?Goiter: Last ultrasound 12-2014, nodules decrease in size ?MSK:  ?--Back - coccyxt pain, lumbar surgery 2014, chronic residual pain ?--on Tramadol (rx by back surgeon)   ?Vit d def ?Chronic Dizziness ?Eczema/psoriasis , sees derm ?covid infex 08/20/20, 06-2021 ?Breast cancer 07/26/2020.  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  See OV note pulmonary 10/2020. ? ? ?PLAN: ?Here for CPX, LOV 04-2021 ?Prediabetes: Check A1c, diet discussed ?HTN: BP is very good today, at home has occasional low diastolic BP, rec to continue checking, if low BPs are more frequently - let me know.  Continue amlodipine. ?Hyperlipidemia: On simvastatin 20 mg, history of CAD per  cardiac catheterization, check FLP ?MSK: Left hip pain, currently on  tramadol and ibuprofen.  Request a referral to Ortho, Dr. Maxie Better.  Will do.  Advise to minimize ibuprofen -I favor tylenol -  GI precautions disc

## 2021-11-23 NOTE — Patient Instructions (Addendum)
Vaccines I recommend: Pneumonia #20, Shingrix and tetanus booster ? ?Continue checking your blood pressure ?BP GOAL is between 110/65 and  135/85. ?If it is consistently higher or lower, let me know ? ?Referring you to orthopedic surgery ? ? ?GO TO THE LAB : Get the blood work   ? ? ?[place], [place] ?Come back for   a checkup in 6 to 8  months ? ? ?"Living will", "Health Care Power of attorney": Advanced care planning ? ?(If you already have a living will or healthcare power of attorney, please bring the copy to be scanned in your chart.) ? ?Advance care planning is a process that supports adults in  understanding and sharing their preferences regarding future medical care.  ? ?[name] preferences are recorded in documents called Advance Directives.    ?Advanced directives are completed (and can be modified at any time) while the patient is in full mental capacity.  ? ?The documentation should be available at all times to the patient, the family and the healthcare providers.  ?Bring in a copy to be scanned in your chart is an excellent idea and is recommended  ? ?This legal documents direct treatment decision making and/or appoint a surrogate to make the decision if the patient is not capable to do so.  ? ? ?Advance directives can be documented in many types of formats,  documents have names such as:  ?Lliving will  ?Durable power of attorney for healthcare (healthcare proxy or healthcare power of attorney)  ?Combined directives  ?Physician orders for life-sustaining treatment  ?  ?More information at: ? [email or web address]  ?

## 2021-11-23 NOTE — Telephone Encounter (Signed)
Patient had lab work and advised that she needed to collect a urine.  After patient left we checked in the bathroom and she left an empty cup.   ? ?Called and advised that we needed to collect the urine in order to continue to fill her medication.  Patient scheduled to come back next week and advised patient to let us know if she is unable to get a urine. ?

## 2021-11-24 ENCOUNTER — Encounter: Payer: Self-pay | Admitting: Internal Medicine

## 2021-11-24 NOTE — Assessment & Plan Note (Signed)
Here for CPX, LOV 04-2021 ?Prediabetes: Check A1c, diet discussed ?HTN: BP is very good today, at home has occasional low diastolic BP, rec to continue checking, if low BPs are more frequently - let me know.  Continue amlodipine. ?Hyperlipidemia: On simvastatin 20 mg, history of CAD per cardiac catheterization, check FLP ?MSK: Left hip pain, currently on  tramadol and ibuprofen.  Request a referral to Ortho, Dr. Maxie Better.  Will do.  Advise to minimize ibuprofen -I favor tylenol -  GI precautions discussed. ?Vitamin D deficiency: Previously vitamin D has been very high, recheck today. ?Anxiety: Controlled on Lexapro 10 mg, on Xanax as needed, check UDS ?Pulmonary: DOE, lung fibrosis per CT: Seems stable on Breo. ?Increased TSH: Checking labs ?RTC 8 months ?

## 2021-11-24 NOTE — Assessment & Plan Note (Signed)
-   Td 2012: Recommend booster ?- PNM 23: 2010;  prevnar--2015.  PNM 20: Declined today ?- shingles shot 2013 ?-Shingrix : Recommended ?- C-19 vaccine: Has consistently declined ?-female care ?No recent PAP, h/o hysterectomy, no h/o abnormal PAPs, no sxs: no further PAPs ?MMG 09-2021, [facility]. ?DEXA 2016 T score -1.1, 06/2019 T score - 1.6 ? --CCS: ?Cscope 2004, + polyps adenomatous ?colonoscopy  07-2009, no polyps.  + cologuard 2019, Cscope 01/2018, 2 polyps, next 5 years per GI letter ?-Labs:   FLP, A1c, TSH, free T4, vitamin D ?-Diet and exercise discussed.  ?-ACP discussed & info provided  ?

## 2021-11-27 MED ORDER — LEVOTHYROXINE SODIUM 50 MCG PO TABS: 50.0000 ug | ORAL_TABLET | Freq: Every day | ORAL | 0 refills | Status: DC

## 2021-11-27 MED ORDER — VITAMIN D (ERGOCALCIFEROL) 1.25 MG (50000 UNIT) PO CAPS: 50000.0000 [IU] | ORAL_CAPSULE | ORAL | 0 refills | Status: DC

## 2021-11-27 NOTE — Addendum Note (Signed)
Addended byDamita Dunnings D on: 11/27/2021 09:15 AM ? ? Modules accepted: Orders ? ?

## 2021-11-28 ENCOUNTER — Other Ambulatory Visit: Payer: Medicare Other

## 2021-11-28 DIAGNOSIS — Z79899 Other long term (current) drug therapy: Secondary | ICD-10-CM | POA: Diagnosis not present

## 2021-11-28 DIAGNOSIS — F419 Anxiety disorder, unspecified: Secondary | ICD-10-CM

## 2021-11-28 DIAGNOSIS — F32A Depression, unspecified: Secondary | ICD-10-CM | POA: Diagnosis not present

## 2021-11-30 ENCOUNTER — Encounter (HOSPITAL_COMMUNITY): Payer: Self-pay

## 2021-11-30 LAB — DRUG MONITORING PANEL 375977 , URINE
Alcohol Metabolites: NEGATIVE ng/mL (ref <500)
Amphetamines: NEGATIVE ng/mL (ref <500)
Barbiturates: NEGATIVE ng/mL (ref <300)
Benzodiazepines: NEGATIVE ng/mL (ref <100)
Cocaine Metabolite: NEGATIVE ng/mL (ref <150)
Desmethyltramadol: 2303 ng/mL — ABNORMAL HIGH (ref <100)
Marijuana Metabolite: NEGATIVE ng/mL (ref <20)
Opiates: NEGATIVE ng/mL (ref <100)
Oxycodone: NEGATIVE ng/mL (ref <100)
Tramadol: 3846 ng/mL — ABNORMAL HIGH (ref <100)

## 2021-11-30 LAB — DM TEMPLATE

## 2021-12-20 DIAGNOSIS — M5451 Vertebrogenic low back pain: Secondary | ICD-10-CM | POA: Diagnosis not present

## 2022-01-12 ENCOUNTER — Other Ambulatory Visit: Payer: Self-pay | Admitting: Internal Medicine

## 2022-02-05 DIAGNOSIS — N6321 Unspecified lump in the left breast, upper outer quadrant: Secondary | ICD-10-CM | POA: Diagnosis not present

## 2022-02-05 DIAGNOSIS — Z170 Estrogen receptor positive status [ER+]: Secondary | ICD-10-CM | POA: Diagnosis not present

## 2022-02-05 DIAGNOSIS — C50412 Malignant neoplasm of upper-outer quadrant of left female breast: Secondary | ICD-10-CM | POA: Diagnosis not present

## 2022-02-06 ENCOUNTER — Ambulatory Visit (INDEPENDENT_AMBULATORY_CARE_PROVIDER_SITE_OTHER): Payer: Medicare Other | Admitting: Internal Medicine

## 2022-02-06 ENCOUNTER — Encounter: Payer: Self-pay | Admitting: Internal Medicine

## 2022-02-06 VITALS — BP 132/70 | HR 76 | Temp 97.9°F | Resp 16 | Ht 62.0 in | Wt 142.2 lb

## 2022-02-06 DIAGNOSIS — E559 Vitamin D deficiency, unspecified: Secondary | ICD-10-CM | POA: Diagnosis not present

## 2022-02-06 DIAGNOSIS — E039 Hypothyroidism, unspecified: Secondary | ICD-10-CM | POA: Diagnosis not present

## 2022-02-06 LAB — TSH: TSH: 2.58 u[IU]/mL (ref 0.35–5.50)

## 2022-02-06 NOTE — Assessment & Plan Note (Signed)
Hypothyroidism: New dx, this was explained to the patient.  On Synthroid, check a TSH, will continue adjusting medications as needed. Vitamin D deficiency: Currently on ergocalciferol.  Recheck labs in few months History of breast cancer: She felt a lump at the left breast about 3 to 4 weeks ago, went to see Dr. Barry Dienes yesterday, they recommended a MMG & [place] RTC 6 months

## 2022-02-06 NOTE — Patient Instructions (Signed)
    GO TO THE LAB : Get the blood work     GO TO THE FRONT DESK, PLEASE SCHEDULE YOUR APPOINTMENTS Come back for a checkup in 6 months 

## 2022-02-06 NOTE — Progress Notes (Signed)
Subjective:    Patient ID: [name], female    DOB: 05/15/1946, 76 y.o.   MRN: [id number]  DOS:  02/06/2022 Type of visit - description: f/u  Since the last office visit, was diagnosed with hypothyroidism, on Synthroid. Good compliance and tolerance. Also, vitamin D was low and she is taking ergocalciferol.  Admits to feeling slightly more energetic. No nausea vomiting or diarrhea No chest pain no palpitations No lower extremity edema  Review of Systems See above   Past Medical History:  Diagnosis Date   Anxiety and depression    Arthritis    Blood transfusion without reported diagnosis 1989   GB hemorrage    Cataract    Bil/no surgery yet.   Coccyx pain    Secondary to scar tissue from old pressure ulcer after back surgery, Dr. Brantley Stage   Coronary artery disease    Depression 2010   panic attacks and depression   Dizziness    severe: MRI chronic isch. changes and mastoiditis- saw Dr.Mundy(2007)  STATES SHE STILL HAS EPISODES- ESPECIALLY IF SHE GETS UP TOO QUICKLY AFTER LYING DOWN   Eczema    Family history of breast cancer    Family history of kidney cancer    Family history of lung cancer    Family history of melanoma    Family history of stomach cancer    Family history of throat cancer    GERD (gastroesophageal reflux disease)    Goiter    Headache(784.0)    Hyperlipidemia    Hypertension    Pain    PAIN LOWER BACK AND DOWN RIGHT LEG WITH NUMBNESS, TINGLING RT LEG AND FOOT--STENOSIS   Partial tear of right rotator cuff    & labral tear   S/P cardiac cath 2009   Revealing nonobstructive CAD w/ tubular, discrete 40% mid lesion in the circumflex; discrete 30% proximal lesion in the LAD; luminal irregularities, 35% mid lesion in RCA; and generic 40% mid lesion in the RCA. Medical treatment recommended.   S/P cardiac cath 11/24/2014   Revealing nonobstructive CAD w/ tubular, discrete 40% mid lesion in the circumflex; discrete 30% proximal lesion in the  LAD; luminal irregularities, 35% mid lesion in RCA; and generic 40% mid lesion in the RCA. Medical treatment recommended.   Stroke [facility]) 2004   mild TIA    Past Surgical History:  Procedure Laterality Date   ABDOMINAL HYSTERECTOMY     oophorectomy L only   BREAST CYST ASPIRATION Left    BREAST CYST EXCISION Left    BREAST LUMPECTOMY     BREAST LUMPECTOMY WITH RADIOACTIVE SEED LOCALIZATION Left 09/08/2020   Procedure: LEFT BREAST LUMPECTOMY WITH RADIOACTIVE SEED LOCALIZATION;  Surgeon: Stark Klein, MD;  Location: [place];  Service: General;  Laterality: Left;   BUNIONECTOMY  1990s   LEFT   CHOLECYSTECTOMY     HAND SURGERY  1990s   R hand x 2, L hand x 1   LUMBAR LAMINECTOMY/DECOMPRESSION MICRODISCECTOMY N/A 11/13/2012   Procedure: MICRO LUMBAR DECOMPRESSION L4 - L5 AND L2 - L3 2 LEVELS;  Surgeon: Johnn Hai, MD;  Location: [facility];  Service: Orthopedics;  Laterality: N/A;   SHOULDER SURGERY  2006   left     Current Outpatient Medications  Medication Instructions   acetaminophen (TYLENOL) 500 mg, Oral, Every 6 hours PRN   ALPRAZolam (XANAX) 0.25-0.5 mg, Oral, At bedtime PRN   amLODipine (NORVASC) 10 MG tablet TAKE 1 TABLET(10 MG) BY MOUTH DAILY  aspirin 81 MG chewable tablet Oral, Daily   calcium-vitamin D 250-100 MG-UNIT tablet 1 tablet, Oral, 2 times daily   escitalopram (LEXAPRO) 10 mg, Oral, Daily   famotidine (PEPCID) 20 mg, Oral, 2 times daily   fluticasone furoate-vilanterol (BREO ELLIPTA) 200-25 MCG/INH AEPB 1 puff, Inhalation, Daily   levothyroxine (SYNTHROID) 50 mcg, Oral, Daily before breakfast   simvastatin (ZOCOR) 20 MG tablet TAKE 1 TABLET(20 MG) BY MOUTH AT BEDTIME   tamoxifen (NOLVADEX) 10 mg, Oral, Daily   traMADol (ULTRAM) 50 mg, Oral, Daily PRN   Vitamin D (Ergocalciferol) (DRISDOL) 50,000 Units, Oral, Every 7 days       Objective:   Physical Exam BP 132/70   Pulse 76   Temp 97.9 F (36.6 C) (Oral)   Resp 16   Ht '5\' 2"'$  (1.575  m)   Wt 142 lb 4 oz (64.5 kg)   SpO2 97%   BMI 26.02 kg/m  General:   Well developed, NAD, BMI noted. HEENT:  Normocephalic . Face symmetric, atraumatic Lungs:  CTA B Normal respiratory effort, no intercostal retractions, no accessory muscle use. Heart: RRR,  no murmur.  Lower extremities: no pretibial edema bilaterally  Skin: Not pale. Not jaundice Neurologic:  alert & oriented X3.  Speech normal, gait appropriate for age and unassisted Psych--  Cognition and judgment appear intact.  Cooperative with normal attention span and concentration.  Behavior appropriate. No anxious or depressed appearing.      Assessment     Assessment  Prediabetes HTN Hyperlipidemia Anxiety depression, insomnia- xanax rx by pcp GERD CV: TIA? (2004) CAD: catheterization 2009 and 2016: Non-obstructive CAD, 40% blockages, see report. rx medical treatment Goiter: Last ultrasound 12-2014, nodules decrease in size MSK:  --Back - coccyxt pain, lumbar surgery 2014, chronic residual pain --on Tramadol (rx by back surgeon)   Vit d def Chronic Dizziness Eczema/psoriasis , sees derm covid infex 08/20/20, 06-2021 Breast cancer 07/26/2020.  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  See OV note pulmonary 10/2020.   PLAN: Hypothyroidism: New dx, this was explained to the patient.  On Synthroid, check a TSH, will continue adjusting medications as needed. Vitamin D deficiency: Currently on ergocalciferol.  Recheck labs in few months History of breast cancer: She felt a lump at the left breast about 3 to 4 weeks ago, went to see Dr. Barry Dienes yesterday, they recommended a MMG & [place] RTC 6 months

## 2022-02-08 MED ORDER — LEVOTHYROXINE SODIUM 50 MCG PO TABS: 50.0000 ug | ORAL_TABLET | Freq: Every day | ORAL | 1 refills | Status: DC

## 2022-02-08 NOTE — Addendum Note (Signed)
Addended byDamita Dunnings D on: 02/08/2022 01:10 PM   Modules accepted: Orders

## 2022-02-09 ENCOUNTER — Other Ambulatory Visit: Payer: Self-pay | Admitting: Internal Medicine

## 2022-02-12 ENCOUNTER — Other Ambulatory Visit: Payer: Self-pay | Admitting: General Surgery

## 2022-02-12 DIAGNOSIS — N6321 Unspecified lump in the left breast, upper outer quadrant: Secondary | ICD-10-CM

## 2022-02-13 DIAGNOSIS — H25013 Cortical age-related cataract, bilateral: Secondary | ICD-10-CM | POA: Diagnosis not present

## 2022-02-13 DIAGNOSIS — H2513 Age-related nuclear cataract, bilateral: Secondary | ICD-10-CM | POA: Diagnosis not present

## 2022-02-13 DIAGNOSIS — H2511 Age-related nuclear cataract, right eye: Secondary | ICD-10-CM | POA: Diagnosis not present

## 2022-02-13 DIAGNOSIS — H18413 Arcus senilis, bilateral: Secondary | ICD-10-CM | POA: Diagnosis not present

## 2022-02-13 DIAGNOSIS — H25043 Posterior subcapsular polar age-related cataract, bilateral: Secondary | ICD-10-CM | POA: Diagnosis not present

## 2022-02-16 ENCOUNTER — Other Ambulatory Visit: Payer: Self-pay | Admitting: Internal Medicine

## 2022-02-20 ENCOUNTER — Ambulatory Visit
Admission: RE | Admit: 2022-02-20 | Discharge: 2022-02-20 | Disposition: A | Discharge status: Home / Self Care | Payer: Medicare Other | Source: Ambulatory Visit | Attending: General Surgery | Admitting: General Surgery

## 2022-02-20 DIAGNOSIS — N6321 Unspecified lump in the left breast, upper outer quadrant: Secondary | ICD-10-CM

## 2022-02-20 DIAGNOSIS — Z853 Personal history of malignant neoplasm of breast: Secondary | ICD-10-CM | POA: Diagnosis not present

## 2022-03-19 ENCOUNTER — Telehealth: Payer: Self-pay | Admitting: Internal Medicine

## 2022-03-19 NOTE — Telephone Encounter (Signed)
Pt stated her belly button has been hurting for a while and is bloody with drainage. Transferred to triage for nurse eval.

## 2022-03-19 NOTE — Telephone Encounter (Signed)
Nurse Assessment Nurse: Jenny Evert, RN, Levada Dy Date/Time Eilene Ghazi Time): 03/19/2022 10:19:14 AM Confirm and document reason for call. If symptomatic, describe symptoms. ---Caller states she is having drainage from her navel area. It is painful and the drainage has a foul odor Does the patient have any new or worsening symptoms? ---Yes Will a triage be completed? ---Yes Related visit to physician within the last 2 weeks? ---No Does the PT have any chronic conditions? (i.e. diabetes, asthma, this includes High risk factors for pregnancy, etc.) ---Yes List chronic conditions. ---hypothyroid Is this a behavioral health or substance abuse call? ---No Guidelines Guideline Title Affirmed Question Affirmed Notes Nurse Date/Time (Eastern Time) Sores [1] Looks infected (spreading redness, pus) AND [2] large red area (> 2 in. or 5 cm) Jenny Evert, RN, Levada Dy 03/19/2022 10:21:02 AM Disp. Time Eilene Ghazi Time) Disposition Final User 03/19/2022 10:22:44 AM See HCP within 4 Hours (or PCP triage) Yes Jenny Evert, RN, Levada Dy PLEASE NOTE: All timestamps contained within this report are represented as [place] Standard Time. CONFIDENTIALTY NOTICE: This fax transmission is intended only for the addressee. It contains information that is legally privileged, confidential or otherwise protected from use or disclosure. If you are not the intended recipient, you are strictly prohibited from reviewing, disclosing, copying using or disseminating any of this information or taking any action in reliance on or regarding this information. If you have received this fax in error, please notify us immediately by telephone so that we can arrange for its return to [place]. Phone: [phone], Toll-Free: [phone], Fax: [phone] Page: 2 of 2 Call Id: [id number] Final Disposition 03/19/2022 10:22:44 AM See HCP within 4 Hours (or PCP triage) Yes Jenny Evert, RN, Marin Shutter Disagree/Comply Comply Caller Understands Yes PreDisposition  Did not know what to do Care Advice Given Per Guideline SEE HCP (OR PCP TRIAGE) WITHIN 4 HOURS: CALL BACK IF: * You become worse CARE ADVICE given per Sores (Adult) guideline. Referrals REFERRED TO PCP OFFICE

## 2022-04-02 ENCOUNTER — Other Ambulatory Visit: Payer: Self-pay

## 2022-04-02 ENCOUNTER — Inpatient Hospital Stay: Payer: Medicare Other

## 2022-04-02 ENCOUNTER — Inpatient Hospital Stay: Payer: Medicare Other | Attending: Hematology | Admitting: Hematology

## 2022-04-02 ENCOUNTER — Encounter: Payer: Self-pay | Admitting: Hematology

## 2022-04-02 VITALS — BP 126/71 | HR 85 | Temp 99.1°F | Resp 19 | Ht 62.0 in | Wt 143.4 lb

## 2022-04-02 DIAGNOSIS — L409 Psoriasis, unspecified: Secondary | ICD-10-CM | POA: Diagnosis not present

## 2022-04-02 DIAGNOSIS — Z800 Family history of malignant neoplasm of digestive organs: Secondary | ICD-10-CM | POA: Diagnosis not present

## 2022-04-02 DIAGNOSIS — Z885 Allergy status to narcotic agent status: Secondary | ICD-10-CM | POA: Insufficient documentation

## 2022-04-02 DIAGNOSIS — F419 Anxiety disorder, unspecified: Secondary | ICD-10-CM | POA: Insufficient documentation

## 2022-04-02 DIAGNOSIS — M8580 Other specified disorders of bone density and structure, unspecified site: Secondary | ICD-10-CM | POA: Diagnosis not present

## 2022-04-02 DIAGNOSIS — Z8673 Personal history of transient ischemic attack (TIA), and cerebral infarction without residual deficits: Secondary | ICD-10-CM | POA: Diagnosis not present

## 2022-04-02 DIAGNOSIS — Z808 Family history of malignant neoplasm of other organs or systems: Secondary | ICD-10-CM | POA: Diagnosis not present

## 2022-04-02 DIAGNOSIS — Z79899 Other long term (current) drug therapy: Secondary | ICD-10-CM | POA: Diagnosis not present

## 2022-04-02 DIAGNOSIS — E2839 Other primary ovarian failure: Secondary | ICD-10-CM

## 2022-04-02 DIAGNOSIS — Z8051 Family history of malignant neoplasm of kidney: Secondary | ICD-10-CM | POA: Insufficient documentation

## 2022-04-02 DIAGNOSIS — Z79890 Hormone replacement therapy (postmenopausal): Secondary | ICD-10-CM | POA: Diagnosis not present

## 2022-04-02 DIAGNOSIS — Z170 Estrogen receptor positive status [ER+]: Secondary | ICD-10-CM

## 2022-04-02 DIAGNOSIS — D693 Immune thrombocytopenic purpura: Secondary | ICD-10-CM | POA: Insufficient documentation

## 2022-04-02 DIAGNOSIS — I2510 Atherosclerotic heart disease of native coronary artery without angina pectoris: Secondary | ICD-10-CM | POA: Diagnosis not present

## 2022-04-02 DIAGNOSIS — F32A Depression, unspecified: Secondary | ICD-10-CM | POA: Insufficient documentation

## 2022-04-02 DIAGNOSIS — E785 Hyperlipidemia, unspecified: Secondary | ICD-10-CM | POA: Diagnosis not present

## 2022-04-02 DIAGNOSIS — Z79810 Long term (current) use of selective estrogen receptor modulators (SERMs): Secondary | ICD-10-CM | POA: Insufficient documentation

## 2022-04-02 DIAGNOSIS — Z9049 Acquired absence of other specified parts of digestive tract: Secondary | ICD-10-CM | POA: Diagnosis not present

## 2022-04-02 DIAGNOSIS — C50412 Malignant neoplasm of upper-outer quadrant of left female breast: Secondary | ICD-10-CM

## 2022-04-02 DIAGNOSIS — I10 Essential (primary) hypertension: Secondary | ICD-10-CM | POA: Insufficient documentation

## 2022-04-02 DIAGNOSIS — E039 Hypothyroidism, unspecified: Secondary | ICD-10-CM | POA: Insufficient documentation

## 2022-04-02 DIAGNOSIS — Z803 Family history of malignant neoplasm of breast: Secondary | ICD-10-CM | POA: Insufficient documentation

## 2022-04-02 LAB — COMPREHENSIVE METABOLIC PANEL
ALT: 6 U/L (ref 0–44)
AST: 13 U/L — ABNORMAL LOW (ref 15–41)
Albumin: 3.8 g/dL (ref 3.5–5.0)
Alkaline Phosphatase: 53 U/L (ref 38–126)
Anion gap: 4 — ABNORMAL LOW (ref 5–15)
BUN: 11 mg/dL (ref 8–23)
CO2: 28 mmol/L (ref 22–32)
Calcium: 9.4 mg/dL (ref 8.9–10.3)
Chloride: 106 mmol/L (ref 98–111)
Creatinine, Ser: 0.81 mg/dL (ref 0.44–1.00)
GFR, Estimated: >60 mL/min (ref >60)
Glucose, Bld: 114 mg/dL — ABNORMAL HIGH (ref 70–99)
Potassium: 3.9 mmol/L (ref 3.5–5.1)
Sodium: 138 mmol/L (ref 135–145)
Total Bilirubin: 0.3 mg/dL (ref 0.3–1.2)
Total Protein: 6.2 g/dL — ABNORMAL LOW (ref 6.5–8.1)

## 2022-04-02 LAB — CBC WITH DIFFERENTIAL/PLATELET
Abs Immature Granulocytes: 0.01 10*3/uL (ref 0.00–0.07)
Basophils Absolute: 0 10*3/uL (ref 0.0–0.1)
Basophils Relative: 0 %
Eosinophils Absolute: 0.1 10*3/uL (ref 0.0–0.5)
Eosinophils Relative: 2 %
HCT: 40.3 % (ref 36.0–46.0)
Hemoglobin: 13.9 g/dL (ref 12.0–15.0)
Immature Granulocytes: 0 %
Lymphocytes Relative: 39 %
Lymphs Abs: 3 10*3/uL (ref 0.7–4.0)
MCH: 33.1 pg (ref 26.0–34.0)
MCHC: 34.5 g/dL (ref 30.0–36.0)
MCV: 96 fL (ref 80.0–100.0)
Monocytes Absolute: 0.6 10*3/uL (ref 0.1–1.0)
Monocytes Relative: 8 %
Neutro Abs: 4.1 10*3/uL (ref 1.7–7.7)
Neutrophils Relative %: 51 %
Platelets: 112 10*3/uL — ABNORMAL LOW (ref 150–400)
RBC: 4.2 MIL/uL (ref 3.87–5.11)
RDW: 13 % (ref 11.5–15.5)
WBC: 7.8 10*3/uL (ref 4.0–10.5)
nRBC: 0 % (ref 0.0–0.2)

## 2022-04-02 NOTE — Progress Notes (Signed)
[name]   [phone]) [phone] [phone]   Clinic Follow up Note   Patient Care Team: Colon Branch, MD as PCP - General [name], Nadara Mustard, MD as Consulting Physician (Gynecology) Ladene Artist, MD as Consulting Physician (Gastroenterology) Erroll Luna, MD as Consulting Physician (General Surgery) Pedro Earls, MD as Attending Physician (Orthopedic Surgery) Netta Cedars, MD as Consulting Physician (Orthopedic Surgery) Linda Hedges, DO as Consulting Physician (Obstetrics and Gynecology) Mauro Kaufmann, RN as Oncology Nurse Navigator Rockwell Germany, RN as Oncology Nurse Navigator Stark Klein, MD as Consulting Physician (General Surgery) Truitt Merle, MD as Consulting Physician (Hematology) Eppie Gibson, MD as Attending Physician (Radiation Oncology) Edythe Clarity, [facility] (Pharmacist) Edythe Clarity, [facility] (Pharmacist) Alla Feeling, NP as Nurse Practitioner (Nurse Practitioner)  Date of Service:  04/02/2022  CHIEF COMPLAINT: f/u of left breast cancer  CURRENT THERAPY:  Tamoxifen $RemoveBe'20mg'urwyCVSMy$  daily starting 09/2020  ASSESSMENT & PLAN:  [name] is a 76 y.o. post-hysterectomy female with   1. Malignant neoplasm of upper-outer quadrant of left breast, Stage IA, c(T1bN0M0), ER+/PR+/HER2-, Grade I -Diagnosed in 07/2020, s/p left breast lumpectomy on 09/08/20 with Dr Barry Dienes, path showed 0.7cm of invasive ductal carcinoma, with negative margins. Patient opted to forego adjuvant radiation -She started tamoxifen in 09/2020. She is tolerating well with minimal side effects. -most recent b/l MM on 09/29/21 was benign.  -left MM and [place] on 02/20/22 for palpable nodule at incision site was benign. -she is clinically doing well.  Tolerating tamoxifen without significant side effects.  Breast exam showed a 2 cm nodule at her incision site, consistent with the cyst seen on recent US.  Labs are unremarkable.  There is no clinical concern for breast cancer  recurrence. -Continue surveillance and tamoxifen -Follow-up in 6 months, or sooner if needed   2.  Mild Thrombocytopenia, ? ITP -new mild thrombocytopenia noted in lab in 07/2020.  She began tamoxifen 09/2020, so unlikely related to Tamoxifen  -She has psoriasis, she may have autoimmune component such as ITP -plt 112k today (04/02/22), stable   3. Osteopenia  -Her 06/2019 DEXA shows osteopenia at forearm radius with T-score -1.6. She taking Vit D3.   -Previously reviewed the bone strengthening quality of tamoxifen    Plan: -Continue tamoxifen -mammogram and DEXA due 09/2022 -lab and f/u in 6 months   No problem-specific Assessment & Plan notes found for this encounter.   SUMMARY OF ONCOLOGIC HISTORY: Oncology History Overview Note  Cancer Staging Malignant neoplasm of upper-outer quadrant of left breast in female, estrogen receptor positive ([place]) Staging form: Breast, AJCC 8th Edition - Clinical stage from 07/26/2020: Stage IA (cT1b, cN0, cM0, G2, ER+, PR+, HER2-) - Signed by Truitt Merle, MD on 08/09/2020    Malignant neoplasm of upper-outer quadrant of left breast in female, estrogen receptor positive ([place])  07/04/2020 Mammogram   IMPRESSION: Indeterminate 4 x 8 x 5 mm irregular hypoechoic mass left breast 1 o'clock position 4 cm from the nipple.   07/26/2020 Cancer Staging   Staging form: Breast, AJCC 8th Edition - Clinical stage from 07/26/2020: Stage IA (cT1b, cN0, cM0, G2, ER+, PR+, HER2-) - Signed by Truitt Merle, MD on 08/09/2020   07/26/2020 Initial Biopsy   Diagnosis Breast, left, needle core biopsy, upper outer 1 o'clock - INVASIVE DUCTAL CARCINOMA - SEE COMMENT Microscopic Comment Based on the biopsy, the carcinoma appears Nottingham grade 2 of 3 and measures 0.7 cm in greatest linear extent. Prognostic markers (ER/PR/ki-67/HER2) are pending  and will be reported in an addendum. Dr. Jeannie Done reviewed the case and agrees with the above diagnosis. These results were called to [facility] on July 27, 2018.   07/26/2020 Receptors her2   PROGNOSTIC INDICATORS Results: IMMUNOHISTOCHEMICAL AND MORPHOMETRIC ANALYSIS PERFORMED MANUALLY The tumor cells are NEGATIVE for Her2 (1+). Estrogen Receptor: 90%, POSITIVE, STRONG STAINING INTENSITY Progesterone Receptor: 80%, POSITIVE, STRONG STAINING INTENSITY Proliferation Marker Ki67: 2%   08/03/2020 Initial Diagnosis   Malignant neoplasm of upper-outer quadrant of left breast in female, estrogen receptor positive ([place])   09/08/2020 Surgery   LEFT BREAST LUMPECTOMY WITH RADIOACTIVE SEED LOCALIZATION by Dr Barry Dienes   09/08/2020 Pathology Results   FINAL MICROSCOPIC DIAGNOSIS:   A. 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  - Margins not involved.  - Invasive carcinoma 0.1 cm from anterior margin.  - Biopsy site and biopsy clip.  - Fibrocystic changes.  - See oncology table.     09/2020 -  Anti-estrogen oral therapy   Tamoxifen 37m daily starting 09/2020   12/27/2020 Cancer Staging   Staging form: Breast, AJCC 8th Edition - Pathologic: Stage Unknown (pT1b, pNX, cM0, G1, ER+, PR+, HER2-) - Signed by BAlla Feeling NP on 12/27/2020 Histologic grading system: 3 grade system   12/29/2020 Survivorship   SCP delivered by LCira Rue NP    01/17/2021 Genetic Testing   Negative genetic testing:  No pathogenic variants detected on the [facility] CancerNext-Expanded + RNAinsight panel. The report date is 01/17/2021.   The CancerNext-Expanded + RNAinsight gene panel offered by [employer] includes sequencing and rearrangement analysis for the following 77 genes: AIP, ALK, APC, ATM, AXIN2, BAP1, BARD1, BLM, BMPR1A, BRCA1, BRCA2, BRIP1, CDC73, CDH1, CDK4, CDKN1B, CDKN2A, CHEK2, CTNNA1, DICER1, FANCC, FH, FLCN, GALNT12, KIF1B, LZTR1, MAX, MEN1, MET, MLH1, MSH2, MSH3, MSH6, MUTYH, NBN, NF1, NF2, NTHL1, PALB2, PHOX2B, PMS2, POT1, PRKAR1A, PTCH1, PTEN, RAD51C, RAD51D, RB1, RECQL, RET, SDHA,  SDHAF2, SDHB, SDHC, SDHD, SMAD4, SMARCA4, SMARCB1, SMARCE1, STK11, SUFU, TMEM127, TP53, TSC1, TSC2, VHL and XRCC2 (sequencing and deletion/duplication); EGFR, EGLN1, HOXB13, KIT, MITF, PDGFRA, POLD1 and POLE (sequencing only); EPCAM and GREM1 (deletion/duplication only). RNA data is routinely analyzed for use in variant interpretation for all genes.      INTERVAL HISTORY:  Jenny PARLINis here for a follow up of breast cancer. She was last seen by NP Lacie on 09/28/21. She presents to the clinic alone. She reports she is doing well overall. She notes she was recently diagnosed with hypothyroidism.   All other systems were reviewed with the patient and are negative.  MEDICAL HISTORY:  Past Medical History:  Diagnosis Date   Anxiety and depression    Arthritis    Blood transfusion without reported diagnosis 1989   GB hemorrage    Cataract    Bil/no surgery yet.   Coccyx pain    Secondary to scar tissue from old pressure ulcer after back surgery, Dr. CBrantley Stage  Coronary artery disease    Depression 2010   panic attacks and depression   Dizziness    severe: MRI chronic isch. changes and mastoiditis- saw Dr.Mundy(2007)  STATES SHE STILL HAS EPISODES- ESPECIALLY IF SHE GETS UP TOO QUICKLY AFTER LYING DOWN   Eczema    Family history of breast cancer    Family history of kidney cancer    Family history of lung cancer    Family history of melanoma    Family history of stomach cancer  Family history of throat cancer    GERD (gastroesophageal reflux disease)    Goiter    Headache(784.0)    Hyperlipidemia    Hypertension    Pain    PAIN LOWER BACK AND DOWN RIGHT LEG WITH NUMBNESS, TINGLING RT LEG AND FOOT--STENOSIS   Partial tear of right rotator cuff    & labral tear   S/P cardiac cath 2009   Revealing nonobstructive CAD w/ tubular, discrete 40% mid lesion in the circumflex; discrete 30% proximal lesion in the LAD; luminal irregularities, 35% mid lesion in RCA; and generic 40%  mid lesion in the RCA. Medical treatment recommended.   S/P cardiac cath 11/24/2014   Revealing nonobstructive CAD w/ tubular, discrete 40% mid lesion in the circumflex; discrete 30% proximal lesion in the LAD; luminal irregularities, 35% mid lesion in RCA; and generic 40% mid lesion in the RCA. Medical treatment recommended.   Stroke [facility]) 2004   mild TIA    SURGICAL HISTORY: Past Surgical History:  Procedure Laterality Date   ABDOMINAL HYSTERECTOMY     oophorectomy L only   BREAST CYST ASPIRATION Left    BREAST CYST EXCISION Left    BREAST LUMPECTOMY     BREAST LUMPECTOMY WITH RADIOACTIVE SEED LOCALIZATION Left 09/08/2020   Procedure: LEFT BREAST LUMPECTOMY WITH RADIOACTIVE SEED LOCALIZATION;  Surgeon: Stark Klein, MD;  Location: [facility];  Service: General;  Laterality: Left;   BUNIONECTOMY  1990s   LEFT   CHOLECYSTECTOMY     HAND SURGERY  1990s   R hand x 2, L hand x 1   LUMBAR LAMINECTOMY/DECOMPRESSION MICRODISCECTOMY N/A 11/13/2012   Procedure: MICRO LUMBAR DECOMPRESSION L4 - L5 AND L2 - L3 2 LEVELS;  Surgeon: Johnn Hai, MD;  Location: [facility];  Service: Orthopedics;  Laterality: N/A;   SHOULDER SURGERY  2006   left     I have reviewed the social history and family history with the patient and they are unchanged from previous note.  ALLERGIES:  is allergic to codeine, hydrocodone, and sertraline hcl.  MEDICATIONS:  Current Outpatient Medications  Medication Sig Dispense Refill   acetaminophen (TYLENOL) 500 MG tablet Take 500 mg by mouth every 6 (six) hours as needed.     ALPRAZolam (XANAX) 0.25 MG tablet Take 1-2 tablets (0.25-0.5 mg total) by mouth at bedtime as needed for anxiety or sleep. 30 tablet 1   amLODipine (NORVASC) 10 MG tablet TAKE 1 TABLET(10 MG) BY MOUTH DAILY 90 tablet 1   aspirin 81 MG chewable tablet Chew by mouth daily.     calcium-vitamin D 250-100 MG-UNIT tablet Take 1 tablet by mouth 2 (two) times daily.     escitalopram  (LEXAPRO) 20 MG tablet Take 0.5 tablets (10 mg total) by mouth daily. 45 tablet 1   famotidine (PEPCID) 20 MG tablet TAKE 1 TABLET(20 MG) BY MOUTH TWICE DAILY 180 tablet 1   fluticasone furoate-vilanterol (BREO ELLIPTA) 200-25 MCG/INH AEPB Inhale 1 puff into the lungs daily. 60 each 11   levothyroxine (SYNTHROID) 50 MCG tablet Take 1 tablet (50 mcg total) by mouth daily before breakfast. 90 tablet 1   simvastatin (ZOCOR) 20 MG tablet TAKE 1 TABLET(20 MG) BY MOUTH AT BEDTIME 90 tablet 1   tamoxifen (NOLVADEX) 10 MG tablet Take 1 tablet (10 mg total) by mouth daily. 90 tablet 3   traMADol (ULTRAM) 50 MG tablet Take 50 mg by mouth daily as needed for moderate pain.     Vitamin D, Ergocalciferol, (DRISDOL) 1.25  MG (50000 UNIT) CAPS capsule Take 1 capsule (50,000 Units total) by mouth every 7 (seven) days. 12 capsule 0   No current facility-administered medications for this visit.    PHYSICAL EXAMINATION: ECOG PERFORMANCE STATUS: 0 - Asymptomatic  Vitals:   04/02/22 1143  BP: 126/71  Pulse: 85  Resp: 19  Temp: 99.1 F (37.3 C)  SpO2: 97%   Wt Readings from Last 3 Encounters:  04/02/22 143 lb 6.4 oz (65 kg)  02/06/22 142 lb 4 oz (64.5 kg)  11/23/21 146 lb 2 oz (66.3 kg)     GENERAL:alert, no distress and comfortable SKIN: skin color, texture, turgor are normal, no rashes or significant lesions EYES: normal, Conjunctiva are pink and non-injected, sclera clear  NECK: supple, thyroid normal size, non-tender, without nodularity LYMPH:  no palpable lymphadenopathy in the cervical, axillary LUNGS: clear to auscultation and percussion with normal breathing effort HEART: regular rate & rhythm and no murmurs and no lower extremity edema ABDOMEN:abdomen soft, non-tender and normal bowel sounds Musculoskeletal:no cyanosis of digits and no clubbing  NEURO: alert & oriented x 3 with fluent speech, no focal motor/sensory deficits BREAST: (+) 2 cm nodule at 2 o'clock left breast,  2cmfn  LABORATORY DATA:  I have reviewed the data as listed    Latest Ref Rng & Units 04/02/2022   11:25 AM 09/28/2021   10:11 AM 03/31/2021   11:04 AM  CBC  WBC 4.0 - 10.5 K/uL 7.8  8.0  6.7   Hemoglobin 12.0 - 15.0 g/dL 13.9  14.3  14.6   Hematocrit 36.0 - 46.0 % 40.3  42.3  42.9   Platelets 150 - 400 K/uL 112  103  115         Latest Ref Rng & Units 04/02/2022   11:25 AM 09/28/2021   10:11 AM 03/31/2021   11:04 AM  CMP  Glucose 70 - 99 mg/dL 114  138  157   BUN 8 - 23 mg/dL 11  14  10    Creatinine 0.44 - 1.00 mg/dL 0.81  0.74  0.82   Sodium 135 - 145 mmol/L 138  140  141   Potassium 3.5 - 5.1 mmol/L 3.9  3.7  4.2   Chloride 98 - 111 mmol/L 106  108  107   CO2 22 - 32 mmol/L 28  25  24    Calcium 8.9 - 10.3 mg/dL 9.4  9.2  9.4   Total Protein 6.5 - 8.1 g/dL 6.2  6.3  6.5   Total Bilirubin 0.3 - 1.2 mg/dL 0.3  0.5  0.5   Alkaline Phos 38 - 126 U/L 53  68  79   AST 15 - 41 U/L 13  12  16    ALT 0 - 44 U/L 6  6  10        RADIOGRAPHIC STUDIES: I have personally reviewed the radiological images as listed and agreed with the findings in the report. No results found.    Orders Placed This Encounter  Procedures   MM DIAG BREAST TOMO BILATERAL    Standing Status:   Future    Standing Expiration Date:   04/03/2023    Order Specific Question:   Reason for Exam (SYMPTOM  OR DIAGNOSIS REQUIRED)    Answer:   screening    Order Specific Question:   Preferred imaging location?    Answer:   [facility]   DG Bone Density    Standing Status:   Future    Standing Expiration Date:  04/02/2023    Order Specific Question:   Reason for Exam (SYMPTOM  OR DIAGNOSIS REQUIRED)    Answer:   screening    Order Specific Question:   Preferred imaging location?    Answer:   [facility]   All questions were answered. The patient knows to call the clinic with any problems, questions or concerns. No barriers to learning was detected. The total time spent in the appointment was 30  minutes.     Truitt Merle, MD 04/02/2022   I, Wilburn Mylar, am acting as scribe for Truitt Merle, MD.   I have reviewed the above documentation for accuracy and completeness, and I agree with the above.

## 2022-04-13 ENCOUNTER — Other Ambulatory Visit: Payer: Self-pay | Admitting: Internal Medicine

## 2022-04-20 DIAGNOSIS — H2512 Age-related nuclear cataract, left eye: Secondary | ICD-10-CM | POA: Diagnosis not present

## 2022-04-20 DIAGNOSIS — H2511 Age-related nuclear cataract, right eye: Secondary | ICD-10-CM | POA: Diagnosis not present

## 2022-05-03 ENCOUNTER — Telehealth: Payer: Self-pay | Admitting: Internal Medicine

## 2022-05-03 MED ORDER — ALPRAZOLAM 0.25 MG PO TABS
0.2500 mg | ORAL_TABLET | Freq: Every evening | ORAL | 2 refills | Status: DC | PRN
Start: 2022-05-03 — End: 2022-08-07

## 2022-05-03 NOTE — Telephone Encounter (Signed)
Requesting: alprazolam 0.'25mg'$   Contract: 10/26/20 UDS: 11/28/21 Last Visit: 02/06/22 Next Visit: 08/09/22 Last Refill: 10/18/21 #30 and 1RF  Please Advise

## 2022-05-03 NOTE — Telephone Encounter (Signed)
Medication: ALPRAZolam (XANAX) 0.25 MG table  Has the patient contacted their pharmacy? No.   Preferred Pharmacy:   [facility] [address], [place] - [address], [place] [address] Phone: [phone]  Fax: [phone]

## 2022-05-03 NOTE — Telephone Encounter (Signed)
PDMP review, Rx sent

## 2022-05-11 DIAGNOSIS — H2512 Age-related nuclear cataract, left eye: Secondary | ICD-10-CM | POA: Diagnosis not present

## 2022-06-26 ENCOUNTER — Ambulatory Visit: Payer: Medicare Other | Admitting: Internal Medicine

## 2022-07-16 ENCOUNTER — Other Ambulatory Visit: Payer: Self-pay | Admitting: Internal Medicine

## 2022-07-23 ENCOUNTER — Ambulatory Visit
Admission: EM | Admit: 2022-07-23 | Discharge: 2022-07-23 | Disposition: A | Discharge status: Home / Self Care | Payer: Medicare Other | Attending: Physician Assistant | Admitting: Physician Assistant

## 2022-07-23 DIAGNOSIS — H66011 Acute suppurative otitis media with spontaneous rupture of ear drum, right ear: Secondary | ICD-10-CM | POA: Diagnosis not present

## 2022-07-23 MED ORDER — OFLOXACIN 0.3 % OT SOLN: 5.0000 [drp] | Freq: Two times a day (BID) | OTIC | 0 refills | Status: AC

## 2022-07-23 MED ORDER — AMOXICILLIN-POT CLAVULANATE 875-125 MG PO TABS: 1.0000 | ORAL_TABLET | Freq: Two times a day (BID) | ORAL | 0 refills | Status: DC

## 2022-07-23 NOTE — ED Provider Notes (Signed)
[facility]    CSN: [id number] Arrival date & time: 07/23/22  1341      History   Chief Complaint Chief Complaint  Patient presents with   Ear Drainage    HPI [name] is a 77 y.o. female.   Patient here today for evaluation of right ear drainage and pain that started yesterday. She reports she has had some mild discomfort to her left ear as well.  She noted some mild blood-tinged to the drainage from her ear yesterday.  She reports she has had some recent congestion.  She denies any vomiting or diarrhea.  She does not report treatment for symptoms.  The history is provided by the patient.  Ear Drainage Pertinent negatives include no shortness of breath.    Past Medical History:  Diagnosis Date   Anxiety and depression    Arthritis    Blood transfusion without reported diagnosis 1989   GB hemorrage    Cataract    Bil/no surgery yet.   Coccyx pain    Secondary to scar tissue from old pressure ulcer after back surgery, Dr. Brantley Stage   Coronary artery disease    Depression 2010   panic attacks and depression   Dizziness    severe: MRI chronic isch. changes and mastoiditis- saw Dr.Mundy(2007)  STATES SHE STILL HAS EPISODES- ESPECIALLY IF SHE GETS UP TOO QUICKLY AFTER LYING DOWN   Eczema    Family history of breast cancer    Family history of kidney cancer    Family history of lung cancer    Family history of melanoma    Family history of stomach cancer    Family history of throat cancer    GERD (gastroesophageal reflux disease)    Goiter    Headache(784.0)    Hyperlipidemia    Hypertension    Pain    PAIN LOWER BACK AND DOWN RIGHT LEG WITH NUMBNESS, TINGLING RT LEG AND FOOT--STENOSIS   Partial tear of right rotator cuff    & labral tear   S/P cardiac cath 2009   Revealing nonobstructive CAD w/ tubular, discrete 40% mid lesion in the circumflex; discrete 30% proximal lesion in the LAD; luminal irregularities, 35% mid lesion in RCA; and generic  40% mid lesion in the RCA. Medical treatment recommended.   S/P cardiac cath 11/24/2014   Revealing nonobstructive CAD w/ tubular, discrete 40% mid lesion in the circumflex; discrete 30% proximal lesion in the LAD; luminal irregularities, 35% mid lesion in RCA; and generic 40% mid lesion in the RCA. Medical treatment recommended.   Stroke [facility]) 2004   mild TIA    Patient Active Problem List   Diagnosis Date Noted   Vitamin D deficiency 02/06/2022   Genetic testing 01/17/2021   Atherosclerosis of aorta ([place]) 10/26/2020   Family history of breast cancer    Family history of melanoma    Family history of throat cancer    Family history of lung cancer    Family history of kidney cancer    Family history of stomach cancer    Malignant neoplasm of upper-outer quadrant of left breast in female, estrogen receptor positive ([place]) 08/03/2020   Insomnia 05/24/2017   PCP NOTES >>>> 06/02/2015   GERD (gastroesophageal reflux disease) 11/24/2014   S/P cardiac cath 11/24/2014   Allergic reaction 11/28/2012   Spinal stenosis of lumbar region 11/13/2012   Hyperglycemia 02/16/2011   Annual physical exam 12/20/2010   Dyslipidemia 06/12/2010   DJD , back pain, hand pain  06/12/2010   Anxiety and depression 10/15/2007   Essential hypertension 05/09/2007   GOITER, MULTINODULAR 11/12/2006    Past Surgical History:  Procedure Laterality Date   ABDOMINAL HYSTERECTOMY     oophorectomy L only   BREAST CYST ASPIRATION Left    BREAST CYST EXCISION Left    BREAST LUMPECTOMY     BREAST LUMPECTOMY WITH RADIOACTIVE SEED LOCALIZATION Left 09/08/2020   Procedure: LEFT BREAST LUMPECTOMY WITH RADIOACTIVE SEED LOCALIZATION;  Surgeon: Stark Klein, MD;  Location: [facility];  Service: General;  Laterality: Left;   BUNIONECTOMY  1990s   LEFT   CHOLECYSTECTOMY     HAND SURGERY  1990s   R hand x 2, L hand x 1   LUMBAR LAMINECTOMY/DECOMPRESSION MICRODISCECTOMY N/A 11/13/2012   Procedure: MICRO  LUMBAR DECOMPRESSION L4 - L5 AND L2 - L3 2 LEVELS;  Surgeon: Johnn Hai, MD;  Location: [facility];  Service: Orthopedics;  Laterality: N/A;   SHOULDER SURGERY  2006   left     OB History   No obstetric history on file.      Home Medications    Prior to Admission medications   Medication Sig Start Date End Date Taking? Authorizing Provider  amoxicillin-clavulanate (AUGMENTIN) 875-125 MG tablet Take 1 tablet by mouth every 12 (twelve) hours. 07/23/22  Yes Francene Finders, PA-C  ofloxacin (FLOXIN) 0.3 % OTIC solution Place 5 drops into the right ear 2 (two) times daily for 7 days. 07/23/22 07/30/22 Yes Francene Finders, PA-C  acetaminophen (TYLENOL) 500 MG tablet Take 500 mg by mouth every 6 (six) hours as needed.    [provider]  ALPRAZolam Duanne Moron) 0.25 MG tablet Take 1-2 tablets (0.25-0.5 mg total) by mouth at bedtime as needed for anxiety or sleep. 05/03/22   Colon Branch, MD  amLODipine (NORVASC) 10 MG tablet Take 1 tablet (10 mg total) by mouth daily. 04/13/22   Colon Branch, MD  aspirin 81 MG chewable tablet Chew by mouth daily.    [provider]  calcium-vitamin D 250-100 MG-UNIT tablet Take 1 tablet by mouth 2 (two) times daily.    [provider]  escitalopram (LEXAPRO) 20 MG tablet Take 0.5 tablets (10 mg total) by mouth daily. 02/09/22   Colon Branch, MD  famotidine (PEPCID) 20 MG tablet TAKE 1 TABLET(20 MG) BY MOUTH TWICE DAILY 02/09/22   Colon Branch, MD  fluticasone furoate-vilanterol (BREO ELLIPTA) 200-25 MCG/INH AEPB Inhale 1 puff into the lungs daily. 10/27/20   Hunsucker, Bonna Gains, MD  levothyroxine (SYNTHROID) 50 MCG tablet Take 1 tablet (50 mcg total) by mouth daily before breakfast. 02/08/22   Colon Branch, MD  simvastatin (ZOCOR) 20 MG tablet TAKE 1 TABLET(20 MG) BY MOUTH AT BEDTIME 07/17/22   Colon Branch, MD  tamoxifen (NOLVADEX) 10 MG tablet Take 1 tablet (10 mg total) by mouth daily. 09/28/21   Alla Feeling, NP  traMADol (ULTRAM) 50 MG tablet Take  50 mg by mouth daily as needed for moderate pain.    [provider]  Vitamin D, Ergocalciferol, (DRISDOL) 1.25 MG (50000 UNIT) CAPS capsule Take 1 capsule (50,000 Units total) by mouth every 7 (seven) days. 11/27/21   Colon Branch, MD    Family History Family History  Problem Relation Age of Onset   Throat cancer Other        cousin   Heart attack Half-Brother        2 brother s, CABG  Heart disease Half-Brother    Lung cancer Mother        dx late 61s, cousin   Melanoma Mother        multiple, first diagnosed 37s   Lung cancer Brother    Throat cancer Brother 34   Cataracts Father    Heart disease Half-Brother    Kidney cancer Maternal Aunt 96   Cancer Maternal Grandfather        NOS, dx 30s   Stomach cancer Maternal Uncle    Breast cancer Cousin        dx >50, maternal first cousin   Breast cancer Cousin        dx >50, maternal first cousin   Colon cancer Neg Hx    Diabetes Neg Hx     Social History Social History   Tobacco Use   Smoking status: Former    Packs/day: 1.00    Years: 20.00    Total pack years: 20.00    Types: Cigarettes    Quit date: 2000    Years since quitting: 24.0   Smokeless tobacco: Never   Tobacco comments:    quit aprox 2000 after 30 years, 1 to 1.5 ppd  Substance Use Topics   Alcohol use: No   Drug use: No     Allergies   Codeine, Hydrocodone, and Sertraline hcl   Review of Systems Review of Systems  Constitutional:  Negative for chills and fever.  HENT:  Positive for congestion, ear discharge and ear pain.   Eyes:  Negative for discharge and redness.  Respiratory:  Negative for cough and shortness of breath.   Gastrointestinal:  Negative for nausea and vomiting.     Physical Exam Triage Vital Signs ED Triage Vitals  Enc Vitals Group     BP 07/23/22 1444 120/60     Pulse Rate 07/23/22 1443 82     Resp 07/23/22 1443 18     Temp 07/23/22 1443 97.9 F (36.6 C)     Temp Source 07/23/22 1443 Oral     SpO2 07/23/22  1443 94 %     Weight --      Height --      Head Circumference --      Peak Flow --      Pain Score 07/23/22 1442 4     Pain Loc --      Pain Edu? --      Excl. in [place]? --    No data found.  Updated Vital Signs BP 120/60   Pulse 82   Temp 97.9 F (36.6 C) (Oral)   Resp 18   SpO2 94%   Physical Exam Vitals and nursing note reviewed.  Constitutional:      General: She is not in acute distress.    Appearance: Normal appearance. She is not ill-appearing.  HENT:     Head: Normocephalic and atraumatic.     Ears:     Comments: Right TM perforated, purulent drainage noted    Nose: Congestion present.     Mouth/Throat:     Mouth: Mucous membranes are moist.     Pharynx: Oropharynx is clear. No oropharyngeal exudate or posterior oropharyngeal erythema.  Eyes:     Conjunctiva/sclera: Conjunctivae normal.  Cardiovascular:     Rate and Rhythm: Normal rate.  Pulmonary:     Effort: Pulmonary effort is normal.  Neurological:     Mental Status: She is alert.  Psychiatric:        Mood  and Affect: Mood normal.        Behavior: Behavior normal.        Thought Content: Thought content normal.      UC Treatments / Results  Labs (all labs ordered are listed, but only abnormal results are displayed) Labs Reviewed - No data to display  EKG   Radiology No results found.  Procedures Procedures (including critical care time)  Medications Ordered in UC Medications - No data to display  Initial Impression / Assessment and Plan / UC Course  I have reviewed the triage vital signs and the nursing notes.  Pertinent labs & imaging results that were available during my care of the patient were reviewed by me and considered in my medical decision making (see chart for details).    Augmentin prescribed for treatment of suspected otitis media with tympanic membrane rupture.  Ofloxacin drops also prescribed.  Encouraged follow-up with primary care, patient reports that she has  appointment in about 17 days with same.  Encouraged sooner follow-up with any further concerns.  Final Clinical Impressions(s) / UC Diagnoses   Final diagnoses:  Acute suppurative otitis media of right ear with spontaneous rupture of tympanic membrane, recurrence not specified   Discharge Instructions   None    ED Prescriptions     Medication Sig Dispense Auth. Provider   ofloxacin (FLOXIN) 0.3 % OTIC solution Place 5 drops into the right ear 2 (two) times daily for 7 days. 5 mL Francene Finders, PA-C   amoxicillin-clavulanate (AUGMENTIN) 875-125 MG tablet Take 1 tablet by mouth every 12 (twelve) hours. 14 tablet Francene Finders, PA-C      PDMP not reviewed this encounter.   Francene Finders, PA-C 07/23/22 1659

## 2022-07-23 NOTE — ED Triage Notes (Signed)
Pt presents with right ear drainage and pain since yesterday.

## 2022-08-03 ENCOUNTER — Telehealth: Payer: Self-pay | Admitting: Internal Medicine

## 2022-08-03 MED ORDER — AMOXICILLIN-POT CLAVULANATE 875-125 MG PO TABS: 1.0000 | ORAL_TABLET | Freq: Two times a day (BID) | ORAL | 0 refills | Status: DC

## 2022-08-03 NOTE — Telephone Encounter (Signed)
Spoke w/ Pt- informed of recommendations. Pt verbalized understanding.  

## 2022-08-03 NOTE — Telephone Encounter (Signed)
Please advise 

## 2022-08-03 NOTE — Telephone Encounter (Signed)
Advise patient, 5 additional days of antibiotics sent.  Will see her next week. ER again if severe symptoms, ear swelling, fever or chills.

## 2022-08-03 NOTE — Telephone Encounter (Signed)
Pt stated she was seen at the ED for fluid build up in her ear. She has taken all the antibiotics given at the ED but she would like to know if pcp would be able to give her another round as she is still leaking fluid out of her ear. She is scheduled to see pcp on Tuesday.   amoxicillin-clavulanate (AUGMENTIN) 875-125 MG tablet  [facility] ([address]), [place] [facility] [place] [address] ([place]) [place] [address] Phone: [phone]  Fax: [phone]

## 2022-08-07 ENCOUNTER — Ambulatory Visit (INDEPENDENT_AMBULATORY_CARE_PROVIDER_SITE_OTHER): Payer: Medicare Other | Admitting: Internal Medicine

## 2022-08-07 ENCOUNTER — Encounter: Payer: Self-pay | Admitting: Internal Medicine

## 2022-08-07 VITALS — BP 134/86 | HR 76 | Temp 98.0°F | Resp 16 | Ht 62.0 in | Wt 143.0 lb

## 2022-08-07 DIAGNOSIS — H6690 Otitis media, unspecified, unspecified ear: Secondary | ICD-10-CM | POA: Diagnosis not present

## 2022-08-07 DIAGNOSIS — Z79899 Other long term (current) drug therapy: Secondary | ICD-10-CM | POA: Diagnosis not present

## 2022-08-07 DIAGNOSIS — F419 Anxiety disorder, unspecified: Secondary | ICD-10-CM

## 2022-08-07 DIAGNOSIS — E039 Hypothyroidism, unspecified: Secondary | ICD-10-CM | POA: Diagnosis not present

## 2022-08-07 DIAGNOSIS — E559 Vitamin D deficiency, unspecified: Secondary | ICD-10-CM

## 2022-08-07 DIAGNOSIS — F32A Depression, unspecified: Secondary | ICD-10-CM | POA: Diagnosis not present

## 2022-08-07 MED ORDER — FLUCONAZOLE 150 MG PO TABS: 150.0000 mg | ORAL_TABLET | Freq: Every day | ORAL | 0 refills | Status: DC

## 2022-08-07 MED ORDER — ALPRAZOLAM 0.25 MG PO TABS
0.2500 mg | ORAL_TABLET | Freq: Every evening | ORAL | 1 refills | Status: DC | PRN
Start: 2022-08-07 — End: 2023-03-13

## 2022-08-07 NOTE — Patient Instructions (Addendum)
If your ears not completely back to normal in 2 weeks please let me know  When you take Diflucan, hold simvastatin for 3 to 4 days, they may interact.  Reach out to your pulmonary doctors   Vaccines I recommend:  Tdap (tetanus) RSV vaccine Flu shot    GO TO THE LAB : Get the blood work     [place], [place] Come back for a physical exam by May 2024

## 2022-08-07 NOTE — Progress Notes (Signed)
Subjective:    Patient ID: [name], female    DOB: 1946/02/23, 77 y.o.   MRN: [id number]  DOS:  08/07/2022 Type of visit - description: Urgent care follow-up  Symptoms started 07/22/2022, pain and discharge from the right ear.  Hearing was slightly muffled. No tinnitus. Went to the urgent care 07/23/2022, Rx Augmentin, ofloxacin eardrops. Since then, pain and amount of discharge has decreased but is not completely gone.  On further questioning, denies any recent URI however on looking back for the last few weeks she has sneezing, runny nose mild cough.  Minimal sputum production.  She requested to address her chronic medical problems.  Good medication compliance.  Review of Systems See above   Past Medical History:  Diagnosis Date   Anxiety and depression    Arthritis    Blood transfusion without reported diagnosis 1989   GB hemorrage    Cataract    Bil/no surgery yet.   Coccyx pain    Secondary to scar tissue from old pressure ulcer after back surgery, Dr. Brantley Stage   Coronary artery disease    Depression 2010   panic attacks and depression   Dizziness    severe: MRI chronic isch. changes and mastoiditis- saw Dr.Mundy(2007)  STATES SHE STILL HAS EPISODES- ESPECIALLY IF SHE GETS UP TOO QUICKLY AFTER LYING DOWN   Eczema    Family history of breast cancer    Family history of kidney cancer    Family history of lung cancer    Family history of melanoma    Family history of stomach cancer    Family history of throat cancer    GERD (gastroesophageal reflux disease)    Goiter    Headache(784.0)    Hyperlipidemia    Hypertension    Pain    PAIN LOWER BACK AND DOWN RIGHT LEG WITH NUMBNESS, TINGLING RT LEG AND FOOT--STENOSIS   Partial tear of right rotator cuff    & labral tear   S/P cardiac cath 2009   Revealing nonobstructive CAD w/ tubular, discrete 40% mid lesion in the circumflex; discrete 30% proximal lesion in the LAD; luminal irregularities, 35% mid lesion in  RCA; and generic 40% mid lesion in the RCA. Medical treatment recommended.   S/P cardiac cath 11/24/2014   Revealing nonobstructive CAD w/ tubular, discrete 40% mid lesion in the circumflex; discrete 30% proximal lesion in the LAD; luminal irregularities, 35% mid lesion in RCA; and generic 40% mid lesion in the RCA. Medical treatment recommended.   Stroke [facility]) 2004   mild TIA    Past Surgical History:  Procedure Laterality Date   ABDOMINAL HYSTERECTOMY     oophorectomy L only   BREAST CYST ASPIRATION Left    BREAST CYST EXCISION Left    BREAST LUMPECTOMY     BREAST LUMPECTOMY WITH RADIOACTIVE SEED LOCALIZATION Left 09/08/2020   Procedure: LEFT BREAST LUMPECTOMY WITH RADIOACTIVE SEED LOCALIZATION;  Surgeon: Stark Klein, MD;  Location: [facility];  Service: General;  Laterality: Left;   BUNIONECTOMY  1990s   LEFT   CHOLECYSTECTOMY     HAND SURGERY  1990s   R hand x 2, L hand x 1   LUMBAR LAMINECTOMY/DECOMPRESSION MICRODISCECTOMY N/A 11/13/2012   Procedure: MICRO LUMBAR DECOMPRESSION L4 - L5 AND L2 - L3 2 LEVELS;  Surgeon: Johnn Hai, MD;  Location: [facility];  Service: Orthopedics;  Laterality: N/A;   SHOULDER SURGERY  2006   left     Current Outpatient Medications  Medication Instructions   acetaminophen (TYLENOL) 500 mg, Oral, Every 6 hours PRN   ALPRAZolam (XANAX) 0.25-0.5 mg, Oral, At bedtime PRN   amLODipine (NORVASC) 10 mg, Oral, Daily   amoxicillin-clavulanate (AUGMENTIN) 875-125 MG tablet 1 tablet, Oral, Every 12 hours   aspirin 81 MG chewable tablet Oral, Daily   calcium-vitamin D 250-100 MG-UNIT tablet 1 tablet, Oral, 2 times daily   escitalopram (LEXAPRO) 10 mg, Oral, Daily   famotidine (PEPCID) 20 MG tablet TAKE 1 TABLET(20 MG) BY MOUTH TWICE DAILY   fluticasone furoate-vilanterol (BREO ELLIPTA) 200-25 MCG/INH AEPB 1 puff, Inhalation, Daily   levothyroxine (SYNTHROID) 50 mcg, Oral, Daily before breakfast   simvastatin (ZOCOR) 20 MG tablet TAKE 1  TABLET(20 MG) BY MOUTH AT BEDTIME   tamoxifen (NOLVADEX) 10 mg, Oral, Daily   traMADol (ULTRAM) 50 mg, Daily PRN   Vitamin D (Ergocalciferol) (DRISDOL) 50,000 Units, Oral, Every 7 days       Objective:   Physical Exam BP 134/86   Pulse 76   Temp 98 F (36.7 C) (Oral)   Resp 16   Ht '5\' 2"'$  (1.575 m)   Wt 143 lb (64.9 kg)   SpO2 96%   BMI 26.16 kg/m  General:   Well developed, NAD, BMI noted. HEENT:  Normocephalic . Face symmetric, atraumatic. Left ear: Normal Right ear: TM with some amount of discharge covering it, I was not able to see the actual TM.  Canal seems normal. Throat: Symmetric, no lesions. Lungs:  Slightly decreased breath sounds but otherwise clear.  No rhonchi, no quizzing. Normal respiratory effort, no intercostal retractions, no accessory muscle use. Heart: RRR,  no murmur.  Lower extremities: no pretibial edema bilaterally  Skin: Not pale. Not jaundice Neurologic:  alert & oriented X3.  Speech normal, gait appropriate for age and unassisted Psych--  Cognition and judgment appear intact.  Cooperative with normal attention span and concentration.  Behavior appropriate. No anxious or depressed appearing.      Assessment     Assessment  Prediabetes HTN Hyperlipidemia Anxiety depression, insomnia- xanax rx by pcp GERD CV: TIA? (2004) CAD: catheterization 2009 and 2016: Non-obstructive CAD, 40% blockages, see report. rx medical treatment Goiter: Last ultrasound 12-2014, nodules decrease in size MSK:  --Back - coccyxt pain, lumbar surgery 2014, chronic residual pain --on Tramadol (rx by back surgeon)   Vit d def Chronic Dizziness Eczema/psoriasis , sees derm Breast cancer 07/26/2020.  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  See OV note pulmonary 10/2020.   PLAN: R middle ear infection: Purulent infection, on Augmentin, on eardrops, seems to be improving.  This is  the first time she has this kind of issue, asked her to call me if not completely well in 2 weeks and reassess on RTC.  Diflucan requested, sent.  Hold simvastatin for 2 to 3 days when she takes Diflucan. Prediabetes: Last A1c satisfactory HTN: BP is okay today, continue amlodipine. Anxiety, depression, insomnia: PDMP okay, Xanax refill at patient request Breast cancer: Saw oncology 04/02/2022, noted to be doing well, on tamoxifen.  Next visit should be by 09-2022. Thrombocytopenia,?  ITP.  See note from hematology 04/02/2022. Pulmonary fibrosis, last visit with pulmonary April 2022, was recommended to return to the office, has been very busy, but plans to call them at some point.  She is currently asymptomatic other than a mild cough.  Continue Breo Ellipta. Vitamin D deficiency: Had ergocalciferol, currently on OTCs.  Checking levels.  Vaccine advice: See AVS Social: Has 4 brothers, recently lost another brother, has only 1 living.  Emotional support provided. RTC May 2024.

## 2022-08-08 LAB — VITAMIN D 25 HYDROXY (VIT D DEFICIENCY, FRACTURES): VITD: 62.33 ng/mL (ref 30.00–100.00)

## 2022-08-08 LAB — TSH: TSH: 1.73 u[IU]/mL (ref 0.35–5.50)

## 2022-08-08 NOTE — Assessment & Plan Note (Signed)
R middle ear infection: Purulent infection, on Augmentin, on eardrops, seems to be improving.  This is the first time she has this kind of issue, asked her to call me if not completely well in 2 weeks and reassess on RTC.  Diflucan requested, sent.  Hold simvastatin for 2 to 3 days when she takes Diflucan. Prediabetes: Last A1c satisfactory HTN: BP is okay today, continue amlodipine. Anxiety, depression, insomnia: PDMP okay, Xanax refill at patient request Breast cancer: Saw oncology 04/02/2022, noted to be doing well, on tamoxifen.  Next visit should be by 09-2022. Thrombocytopenia,?  ITP.  See note from hematology 04/02/2022. Pulmonary fibrosis, last visit with pulmonary April 2022, was recommended to return to the office, has been very busy, but plans to call them at some point.  She is currently asymptomatic other than a mild cough.  Continue Breo Ellipta. Vitamin D deficiency: Had ergocalciferol, currently on OTCs.  Checking levels. Vaccine advice: See AVS Social: Has 4 brothers, recently lost another brother, has only 1 living.  Emotional support provided. RTC May 2024.

## 2022-08-09 ENCOUNTER — Telehealth: Payer: Self-pay | Admitting: Internal Medicine

## 2022-08-09 ENCOUNTER — Other Ambulatory Visit: Payer: Self-pay | Admitting: Internal Medicine

## 2022-08-09 ENCOUNTER — Ambulatory Visit: Payer: Medicare Other | Admitting: Internal Medicine

## 2022-08-09 LAB — DRUG MONITORING PANEL 375977 , URINE

## 2022-08-09 LAB — DM TEMPLATE

## 2022-08-09 NOTE — Telephone Encounter (Signed)
Patient said that she still has a runny nose and sore throat, feels like she doesn't have much energy.patient wants to know if something can be called in to the [facility] on [address] for her. Please call to advise.

## 2022-08-09 NOTE — Telephone Encounter (Signed)
Please advise 

## 2022-08-10 NOTE — Telephone Encounter (Signed)
Spoke w/ Pt- informed of recommendations. Pt verbalized understanding.  

## 2022-08-10 NOTE — Telephone Encounter (Signed)
Symptoms started 07/22/2022, prescribed Augmentin 07/23/2022 and ofloxacin at the urgent care for otitis media.  Seen by me 08/07/2022. Plan: Flonase 2 sprays on each side of the nose daily Astepro 2 sprays on each side of the nose twice daily Come back next week if not much better.

## 2022-08-20 ENCOUNTER — Telehealth: Payer: Self-pay | Admitting: Internal Medicine

## 2022-08-20 DIAGNOSIS — H6690 Otitis media, unspecified, unspecified ear: Secondary | ICD-10-CM

## 2022-08-20 NOTE — Telephone Encounter (Signed)
Pt called stating that she is still having issues with her ear and was wondering if Dr. Larose Kells would refer her to an ENT specialist.

## 2022-08-20 NOTE — Telephone Encounter (Signed)
ENT referral placed.

## 2022-08-28 ENCOUNTER — Other Ambulatory Visit: Payer: Self-pay | Admitting: Internal Medicine

## 2022-09-06 ENCOUNTER — Encounter: Payer: Self-pay | Admitting: Pulmonary Disease

## 2022-09-06 ENCOUNTER — Ambulatory Visit: Payer: Medicare Other | Admitting: Pulmonary Disease

## 2022-09-06 VITALS — BP 134/80 | HR 86 | Temp 98.7°F | Ht 61.0 in | Wt 139.0 lb

## 2022-09-06 DIAGNOSIS — J849 Interstitial pulmonary disease, unspecified: Secondary | ICD-10-CM | POA: Diagnosis not present

## 2022-09-06 DIAGNOSIS — R052 Subacute cough: Secondary | ICD-10-CM

## 2022-09-06 NOTE — Progress Notes (Signed)
$@PatientY$  ID: [name], female    DOB: May 30, 1946, 77 y.o.   MRN: [id number]  Chief Complaint  Patient presents with   Follow-up    Productive cough with clear mucus x 3 weeks.  Completed Augmentin x 2 courses for ear infection Jan 2024.    Referring provider: Colon Branch, MD  HPI:   77 y.o. 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  Multiple PCP notes reviewed.    Overall doing well.  Last seen nearly 2 years ago.  Lost to follow-up.  Sound like he been doing well.  On Breo.  No issues with breathing.  Had repeat CT scan 01/2021 that showed stable upper lobe fibrosis and unusual pattern, felt to be related to prior COVID infection given preceding chest images on x-ray.  Developed ear pain fullness 3 to 4 weeks ago.  Been on 2 courses of Augmentin.  Still persistent symptoms.  Had associated onset of cough at the same time.  Produces white to yellow phlegm.  Cough worse in the evenings.  No position makes his better or worse.  No seasonal environmental factors she identified to make things better or worse.  No other alleviating or exacerbating factors.  She took prednisone early in the course of the it helped too much.  She continues on the [facility] but cough persists.  She thinks cough really is no better or worse, consistent since onset.  HPI at initial visit: Patient onset of viral symptoms around Thanksgiving 2022.  Cough, congestion, sore throat.  Prompted chest x-ray 06/13/2020 with bilateral interstitial infiltrates on my interpretation.  She was prescribed antibiotics.  Repeat chest x-ray 07/21/2020 with persistent slightly worse bilateral infiltrates on my interpretation.  Again prescribed antibiotics.  Had chest x-ray 10/05/2020 which showed persistent infiltrates slightly improved.  Notably, in the interim in 07/2018 she testifies for Covid.  She had D-dimer performed the same day the chest x-ray given  these dyspnea symptoms it was positive.  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  Cough is improved.  Still with dyspnea exertion.  Described as severe.  Worse with inclines or stairs.  No times a day were better or worse.  No seasonal or environmental changes.  No positional changes.  No alleviating or exacerbating factors.  PMH: Anxiety, GERD, breast cancer status post lumpectomy on tamoxifen therapy Surgical history: Lumpectomy Family history: Mother with melanoma, colon cancer, Social history: Former smoker, approximate 20-pack-year history, quit 20+ years ago around 2000  Questionaires / Pulmonary Flowsheets:   ACT:      No data to display          MMRC:     No data to display          Epworth:      No data to display          Tests:   FENO:  No results found for: "NITRICOXIDE"  PFT:     No data to display          WALK:      No data to display          Imaging: Reviewed as per EMR discussion this note No results found.   Lab Results: Reviewed and as per  EMR CBC    Component Value Date/Time   WBC 7.8 04/02/2022 1125   RBC 4.20 04/02/2022 1125   HGB 13.9 04/02/2022 1125   HGB 14.3 09/28/2021 1011   HCT 40.3 04/02/2022 1125   PLT 112 (L) 04/02/2022 1125   PLT 103 (L) 09/28/2021 1011   MCV 96.0 04/02/2022 1125   MCH 33.1 04/02/2022 1125   MCHC 34.5 04/02/2022 1125   RDW 13.0 04/02/2022 1125   LYMPHSABS 3.0 04/02/2022 1125   MONOABS 0.6 04/02/2022 1125   EOSABS 0.1 04/02/2022 1125   BASOSABS 0.0 04/02/2022 1125    BMET    Component Value Date/Time   NA 138 04/02/2022 1125   K 3.9 04/02/2022 1125   CL 106 04/02/2022 1125   CO2 28 04/02/2022 1125   GLUCOSE 114 (H) 04/02/2022 1125   GLUCOSE 131 (H) 06/19/2006  1135   BUN 11 04/02/2022 1125   CREATININE 0.81 04/02/2022 1125   CREATININE 0.74 09/28/2021 1011   CREATININE 0.80 04/27/2020 1056   CALCIUM 9.4 04/02/2022 1125   GFRNONAA >60 04/02/2022 1125   GFRNONAA >60 09/28/2021 1011   GFRAA >90 11/21/2012 0045    BNP No results found for: "BNP"  ProBNP No results found for: "PROBNP"  Specialty Problems   None  Allergies  Allergen Reactions   Codeine Other (See Comments)      chest and stomach pain, severe abd cramping   Hydrocodone     Whelts all over/swelling in lips   Sertraline Hcl Other (See Comments)     muscle aches, diarrhea, nausea    Immunization History  Administered Date(s) Administered   Fluad Quad(high Dose 65+) 04/28/2019, 04/27/2020   Influenza Whole 05/09/2007, 07/18/2009, 06/12/2010   Influenza, High Dose Seasonal PF 06/01/2015, 05/23/2017, 05/08/2018   Influenza, Seasonal, Injecte, Preservative Fre 08/01/2012   Influenza,inj,Quad PF,6+ Mos 04/07/2014   Pneumococcal Conjugate-13 12/03/2013   Pneumococcal Polysaccharide-23 07/18/2009   Tdap 12/20/2010   Zoster, Live 12/26/2011    Past Medical History:  Diagnosis Date   Anxiety and depression    Arthritis    Blood transfusion without reported diagnosis 1989   GB hemorrage    Cataract    Bil/no surgery yet.   Coccyx pain    Secondary to scar tissue from old pressure ulcer after back surgery, Dr. Brantley Stage   Coronary artery disease    Depression 2010   panic attacks and depression   Dizziness    severe: MRI chronic isch. changes and mastoiditis- saw Dr.Mundy(2007)  STATES SHE STILL HAS EPISODES- ESPECIALLY IF SHE GETS UP TOO QUICKLY AFTER LYING DOWN   Eczema    Family history of breast cancer    Family history of kidney cancer    Family history of lung cancer    Family history of melanoma    Family history of stomach cancer    Family history of throat cancer    GERD (gastroesophageal reflux disease)    Goiter    Headache(784.0)    Hyperlipidemia     Hypertension    Pain    PAIN LOWER BACK AND DOWN RIGHT LEG WITH NUMBNESS, TINGLING RT LEG AND FOOT--STENOSIS   Partial tear of right rotator cuff    & labral tear   S/P cardiac cath 2009   Revealing nonobstructive CAD w/ tubular, discrete 40% mid lesion in the circumflex; discrete 30% proximal lesion in the LAD; luminal irregularities, 35% mid lesion in RCA; and generic 40% mid lesion in the RCA. Medical treatment recommended.  S/P cardiac cath 11/24/2014   Revealing nonobstructive CAD w/ tubular, discrete 40% mid lesion in the circumflex; discrete 30% proximal lesion in the LAD; luminal irregularities, 35% mid lesion in RCA; and generic 40% mid lesion in the RCA. Medical treatment recommended.   Stroke ([place]) 2004   mild TIA    Tobacco History: Social History   Tobacco Use  Smoking Status Former   Packs/day: 1.00   Years: 20.00   Total pack years: 20.00   Types: Cigarettes   Quit date: 2000   Years since quitting: 24.1  Smokeless Tobacco Never  Tobacco Comments   quit aprox 2000 after 30 years, 1 to 1.5 ppd   Counseling given: Not Answered Tobacco comments: quit aprox 2000 after 30 years, 1 to 1.5 ppd   Continue to not smoke  Outpatient Encounter Medications as of 09/06/2022  Medication Sig   acetaminophen (TYLENOL) 500 MG tablet Take 500 mg by mouth every 6 (six) hours as needed.   ALPRAZolam (XANAX) 0.25 MG tablet Take 1-2 tablets (0.25-0.5 mg total) by mouth at bedtime as needed for anxiety or sleep.   amLODipine (NORVASC) 10 MG tablet Take 1 tablet (10 mg total) by mouth daily.   aspirin 81 MG chewable tablet Chew by mouth daily.   calcium-vitamin D 250-100 MG-UNIT tablet Take 1 tablet by mouth 2 (two) times daily.   escitalopram (LEXAPRO) 20 MG tablet Take 0.5 tablets (10 mg total) by mouth daily.   famotidine (PEPCID) 20 MG tablet TAKE 1 TABLET(20 MG) BY MOUTH TWICE DAILY   fluticasone furoate-vilanterol (BREO ELLIPTA) 200-25 MCG/INH AEPB Inhale 1 puff into the  lungs daily.   levothyroxine (SYNTHROID) 50 MCG tablet Take 1 tablet (50 mcg total) by mouth daily before breakfast.   simvastatin (ZOCOR) 20 MG tablet TAKE 1 TABLET(20 MG) BY MOUTH AT BEDTIME   tamoxifen (NOLVADEX) 10 MG tablet Take 1 tablet (10 mg total) by mouth daily.   amoxicillin-clavulanate (AUGMENTIN) 875-125 MG tablet Take 1 tablet by mouth every 12 (twelve) hours. (Patient not taking: Reported on 09/06/2022)   fluconazole (DIFLUCAN) 150 MG tablet Take 1 tablet (150 mg total) by mouth daily. For 2 doses (Patient not taking: Reported on 09/06/2022)   No facility-administered encounter medications on file as of 09/06/2022.     Review of Systems  Review of Systems  No chest pain of exertion.  No orthopnea or PND.  Conference review of systems otherwise negative. Physical Exam  BP 134/80 (BP Location: Left Arm, Patient Position: Sitting, Cuff Size: Normal)   Pulse 86   Temp 98.7 F (37.1 C) (Oral)   Ht 5' 1"$  (1.549 m)   Wt 139 lb (63 kg)   SpO2 96%   BMI 26.26 kg/m   Wt Readings from Last 5 Encounters:  09/06/22 139 lb (63 kg)  08/07/22 143 lb (64.9 kg)  04/02/22 143 lb 6.4 oz (65 kg)  02/06/22 142 lb 4 oz (64.5 kg)  11/23/21 146 lb 2 oz (66.3 kg)    BMI Readings from Last 5 Encounters:  09/06/22 26.26 kg/m  08/07/22 26.16 kg/m  04/02/22 26.23 kg/m  02/06/22 26.02 kg/m  11/23/21 26.73 kg/m     Physical Exam General: Well-appearing in no acute distress Eyes: EOMI, no icterus Neck: Supple no JVP Cardiovascular: Regular rhythm, no murmur Pulmonary: Clear rotation bilaterally no crackles Abdomen: Nondistended, bowel sounds present MSK: No synovitis, no joint effusion Neuro: No weakness, normal gait Psych: Normal mood, flat affect   Assessment & Plan:   Subacute  cough: Associated ear discomfort, suspect viral in nature, postviral.  Not improved with Augmentin x 2.  Chest exam is clear.  Continue Breo.  Prednisone taper 40 mg for 5 days then 20 mg 5 days  then 10 mg for 5 days then stop.  If not improving in the coming days recommend she come back for chest x-ray for further evaluation.  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  No connective tissue diseases symptoms.  Will screen with serologic work-up.  Most likely irreversible.  Unlikely to worsen if related to viral process.  Still some time to hope for recovery or improvement given Covid + 07/2020.  Although I am suspicious that this initially started 05/2021 given abnormal chest x-ray and possible viral illness, possibly Covid at that time.  CT scan 01/2021 stable.  Repeat CT scan 01/2023 for 2-year interval follow-up ordered today.  She is essentially asymptomatic in terms of dyspnea etc.   Return in about 5 months (around 02/04/2023).   Lanier Clam, MD 09/06/2022

## 2022-09-06 NOTE — Patient Instructions (Signed)
Nice to see you again  For the ongoing cough, I think this is likely related to ongoing inflammation from prior viral infection.  Especially since the antibiotics have not helped with the cough.  Use prednisone 40 mg for 5 days, then 20 mg for 5 days, then 10 mg for 5 days then stop.  If it is not improving in the next several days please let me know and we will bring you in for a chest x-ray  I would like to repeat a CT scan to keep an eye on the scarring or fibrosis I think is related to COVID infection in the past.  Would like to repeat this in July with a follow-up visit with me shortly thereafter to discuss results.  Return to clinic in 5 months or sooner as needed with Dr. Silas Flood, after CT high-resolution is performed

## 2022-09-30 DIAGNOSIS — M8580 Other specified disorders of bone density and structure, unspecified site: Secondary | ICD-10-CM | POA: Insufficient documentation

## 2022-09-30 NOTE — Assessment & Plan Note (Deleted)
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 She taking Vit D3.   -she is scheduled for DEXA later this week -Previously reviewed the bone strengthening quality of tamoxifen

## 2022-09-30 NOTE — Assessment & Plan Note (Deleted)
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 Patient opted to forego adjuvant radiation -She started tamoxifen in 09/2020. She is tolerating well with minimal side effects. -most recent b/l MM on 09/29/21 was benign.  -left MM and [place] on 02/20/22 for palpable nodule at incision site was benign. -she is clinically doing well.  Tolerating tamoxifen without significant side effects.  Breast exam showed a 2 cm nodule at her incision site, consistent with the cyst seen on recent US.  Labs are unremarkable.  There is no clinical concern for breast cancer recurrence. -Continue surveillance and tamoxifen

## 2022-10-01 ENCOUNTER — Telehealth: Payer: Self-pay | Admitting: Hematology

## 2022-10-01 ENCOUNTER — Inpatient Hospital Stay: Payer: Medicare Other

## 2022-10-01 ENCOUNTER — Inpatient Hospital Stay: Payer: Medicare Other | Admitting: Hematology

## 2022-10-01 DIAGNOSIS — C50412 Malignant neoplasm of upper-outer quadrant of left female breast: Secondary | ICD-10-CM

## 2022-10-01 DIAGNOSIS — M85839 Other specified disorders of bone density and structure, unspecified forearm: Secondary | ICD-10-CM

## 2022-10-01 NOTE — Telephone Encounter (Signed)
Contacted patient to scheduled appointments. Patient is aware of appointments that are scheduled.   

## 2022-10-03 ENCOUNTER — Ambulatory Visit
Admission: RE | Admit: 2022-10-03 | Discharge: 2022-10-03 | Disposition: A | Discharge status: Home / Self Care | Payer: Medicare Other | Source: Ambulatory Visit | Attending: Hematology | Admitting: Hematology

## 2022-10-03 DIAGNOSIS — Z853 Personal history of malignant neoplasm of breast: Secondary | ICD-10-CM | POA: Diagnosis not present

## 2022-10-03 DIAGNOSIS — C50412 Malignant neoplasm of upper-outer quadrant of left female breast: Secondary | ICD-10-CM

## 2022-10-03 DIAGNOSIS — Z780 Asymptomatic menopausal state: Secondary | ICD-10-CM | POA: Diagnosis not present

## 2022-10-03 DIAGNOSIS — M810 Age-related osteoporosis without current pathological fracture: Secondary | ICD-10-CM | POA: Diagnosis not present

## 2022-10-03 DIAGNOSIS — M85851 Other specified disorders of bone density and structure, right thigh: Secondary | ICD-10-CM | POA: Diagnosis not present

## 2022-10-03 DIAGNOSIS — E2839 Other primary ovarian failure: Secondary | ICD-10-CM

## 2022-10-03 DIAGNOSIS — R928 Other abnormal and inconclusive findings on diagnostic imaging of breast: Secondary | ICD-10-CM | POA: Diagnosis not present

## 2022-10-07 NOTE — Assessment & Plan Note (Signed)
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 Patient opted to forego adjuvant radiation -She started tamoxifen in 09/2020. She is tolerating well with minimal side effects. -most recent b/l MM on 09/29/21 was benign.  -left MM and [place] on 02/20/22 for palpable nodule at incision site was benign. -she is clinically doing well.  Tolerating tamoxifen without significant side effects.  Breast exam showed a 2 cm nodule at her incision site, consistent with the cyst seen on recent US.  Labs are unremarkable.  There is no clinical concern for breast cancer recurrence. -Continue surveillance and tamoxifen -Follow-up in 6 months, or sooner if needed

## 2022-10-07 NOTE — Assessment & Plan Note (Signed)
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 She taking Vit D3.   -Previously reviewed the bone strengthening quality of tamoxifen

## 2022-10-08 ENCOUNTER — Other Ambulatory Visit: Payer: Self-pay

## 2022-10-08 ENCOUNTER — Inpatient Hospital Stay: Payer: Medicare Other | Admitting: Hematology

## 2022-10-08 ENCOUNTER — Encounter: Payer: Self-pay | Admitting: Hematology

## 2022-10-08 ENCOUNTER — Inpatient Hospital Stay: Payer: Medicare Other | Attending: Hematology

## 2022-10-08 VITALS — BP 125/70 | HR 92 | Temp 98.0°F | Resp 19 | Ht 61.0 in | Wt 138.0 lb

## 2022-10-08 DIAGNOSIS — E039 Hypothyroidism, unspecified: Secondary | ICD-10-CM | POA: Insufficient documentation

## 2022-10-08 DIAGNOSIS — Z9049 Acquired absence of other specified parts of digestive tract: Secondary | ICD-10-CM | POA: Diagnosis not present

## 2022-10-08 DIAGNOSIS — R011 Cardiac murmur, unspecified: Secondary | ICD-10-CM | POA: Insufficient documentation

## 2022-10-08 DIAGNOSIS — I10 Essential (primary) hypertension: Secondary | ICD-10-CM | POA: Insufficient documentation

## 2022-10-08 DIAGNOSIS — C50412 Malignant neoplasm of upper-outer quadrant of left female breast: Secondary | ICD-10-CM | POA: Diagnosis not present

## 2022-10-08 DIAGNOSIS — Z808 Family history of malignant neoplasm of other organs or systems: Secondary | ICD-10-CM | POA: Diagnosis not present

## 2022-10-08 DIAGNOSIS — Z9889 Other specified postprocedural states: Secondary | ICD-10-CM | POA: Insufficient documentation

## 2022-10-08 DIAGNOSIS — K219 Gastro-esophageal reflux disease without esophagitis: Secondary | ICD-10-CM | POA: Diagnosis not present

## 2022-10-08 DIAGNOSIS — Z79810 Long term (current) use of selective estrogen receptor modulators (SERMs): Secondary | ICD-10-CM | POA: Insufficient documentation

## 2022-10-08 DIAGNOSIS — Z8051 Family history of malignant neoplasm of kidney: Secondary | ICD-10-CM | POA: Diagnosis not present

## 2022-10-08 DIAGNOSIS — Z803 Family history of malignant neoplasm of breast: Secondary | ICD-10-CM | POA: Insufficient documentation

## 2022-10-08 DIAGNOSIS — Z8673 Personal history of transient ischemic attack (TIA), and cerebral infarction without residual deficits: Secondary | ICD-10-CM | POA: Diagnosis not present

## 2022-10-08 DIAGNOSIS — Z800 Family history of malignant neoplasm of digestive organs: Secondary | ICD-10-CM | POA: Diagnosis not present

## 2022-10-08 DIAGNOSIS — M8580 Other specified disorders of bone density and structure, unspecified site: Secondary | ICD-10-CM | POA: Diagnosis not present

## 2022-10-08 DIAGNOSIS — M85839 Other specified disorders of bone density and structure, unspecified forearm: Secondary | ICD-10-CM | POA: Diagnosis not present

## 2022-10-08 DIAGNOSIS — Z170 Estrogen receptor positive status [ER+]: Secondary | ICD-10-CM | POA: Insufficient documentation

## 2022-10-08 DIAGNOSIS — F32A Depression, unspecified: Secondary | ICD-10-CM | POA: Diagnosis not present

## 2022-10-08 DIAGNOSIS — Z90710 Acquired absence of both cervix and uterus: Secondary | ICD-10-CM | POA: Insufficient documentation

## 2022-10-08 DIAGNOSIS — Z885 Allergy status to narcotic agent status: Secondary | ICD-10-CM | POA: Insufficient documentation

## 2022-10-08 DIAGNOSIS — Z801 Family history of malignant neoplasm of trachea, bronchus and lung: Secondary | ICD-10-CM | POA: Diagnosis not present

## 2022-10-08 DIAGNOSIS — Z79899 Other long term (current) drug therapy: Secondary | ICD-10-CM | POA: Insufficient documentation

## 2022-10-08 DIAGNOSIS — F419 Anxiety disorder, unspecified: Secondary | ICD-10-CM | POA: Diagnosis not present

## 2022-10-08 LAB — CBC WITH DIFFERENTIAL/PLATELET
Abs Immature Granulocytes: 0.02 10*3/uL (ref 0.00–0.07)
Basophils Absolute: 0 10*3/uL (ref 0.0–0.1)
Basophils Relative: 0 %
Eosinophils Absolute: 0.1 10*3/uL (ref 0.0–0.5)
Eosinophils Relative: 1 %
HCT: 40.5 % (ref 36.0–46.0)
Hemoglobin: 13.9 g/dL (ref 12.0–15.0)
Immature Granulocytes: 0 %
Lymphocytes Relative: 37 %
Lymphs Abs: 2.7 10*3/uL (ref 0.7–4.0)
MCH: 33.7 pg (ref 26.0–34.0)
MCHC: 34.3 g/dL (ref 30.0–36.0)
MCV: 98.3 fL (ref 80.0–100.0)
Monocytes Absolute: 0.5 10*3/uL (ref 0.1–1.0)
Monocytes Relative: 7 %
Neutro Abs: 3.9 10*3/uL (ref 1.7–7.7)
Neutrophils Relative %: 55 %
Platelets: 116 10*3/uL — ABNORMAL LOW (ref 150–400)
RBC: 4.12 MIL/uL (ref 3.87–5.11)
RDW: 13.2 % (ref 11.5–15.5)
WBC: 7.2 10*3/uL (ref 4.0–10.5)
nRBC: 0 % (ref 0.0–0.2)

## 2022-10-08 LAB — COMPREHENSIVE METABOLIC PANEL
ALT: 7 U/L (ref 0–44)
AST: 12 U/L — ABNORMAL LOW (ref 15–41)
Albumin: 3.8 g/dL (ref 3.5–5.0)
Alkaline Phosphatase: 57 U/L (ref 38–126)
Anion gap: 7 (ref 5–15)
BUN: 15 mg/dL (ref 8–23)
CO2: 26 mmol/L (ref 22–32)
Calcium: 9 mg/dL (ref 8.9–10.3)
Chloride: 104 mmol/L (ref 98–111)
Creatinine, Ser: 0.79 mg/dL (ref 0.44–1.00)
GFR, Estimated: >60 mL/min (ref >60)
Glucose, Bld: 135 mg/dL — ABNORMAL HIGH (ref 70–99)
Potassium: 3.8 mmol/L (ref 3.5–5.1)
Sodium: 137 mmol/L (ref 135–145)
Total Bilirubin: 0.4 mg/dL (ref 0.3–1.2)
Total Protein: 6.2 g/dL — ABNORMAL LOW (ref 6.5–8.1)

## 2022-10-08 MED ORDER — TAMOXIFEN CITRATE 20 MG PO TABS: 20.0000 mg | ORAL_TABLET | Freq: Every day | ORAL | 1 refills | Status: DC

## 2022-10-08 NOTE — Progress Notes (Signed)
[place]   [phone] Fax:([phone]) [phone]   Clinic Follow up Note   Patient Care Team: Colon Branch, MD as PCP - General [name], Nadara Mustard, MD as Consulting Physician (Gynecology) Ladene Artist, MD as Consulting Physician (Gastroenterology) Erroll Luna, MD as Consulting Physician (General Surgery) Pedro Earls, MD as Attending Physician (Orthopedic Surgery) Netta Cedars, MD as Consulting Physician (Orthopedic Surgery) Linda Hedges, DO as Consulting Physician (Obstetrics and Gynecology) Mauro Kaufmann, RN as Oncology Nurse Navigator Rockwell Germany, RN as Oncology Nurse Navigator Stark Klein, MD as Consulting Physician (General Surgery) Truitt Merle, MD as Consulting Physician (Hematology) Eppie Gibson, MD as Attending Physician (Radiation Oncology) Edythe Clarity, [facility] (Pharmacist) Edythe Clarity, [facility] (Pharmacist) Alla Feeling, NP as Nurse Practitioner (Nurse Practitioner)  Date of Service:  10/08/2022  CHIEF COMPLAINT: f/u of left breast cancer   CURRENT THERAPY:  Tamoxifen 20mg  daily starting 09/2020    ASSESSMENT:  [name] is a 77 y.o. female with   Malignant neoplasm of upper-outer quadrant of left breast in female, estrogen receptor positive ([place]) Stage IA, c(T1bN0M0), ER+/PR+/HER2-, Grade I -Diagnosed in 07/2020, s/p left breast lumpectomy on 09/08/20 with Dr Barry Dienes, path showed 0.7cm of invasive ductal carcinoma, with negative margins. Patient opted to forego adjuvant radiation -She started tamoxifen in 09/2020. She is tolerating well with minimal side effects. -most recent b/l MM on 09/29/21 was benign.  -left MM and [place] on 02/20/22 for palpable nodule at incision site was benign. -she is clinically doing well.  Tolerating tamoxifen without significant side effects.  Breast exam showed a 2 cm nodule at her incision site, consistent with the cyst seen on recent US.  Labs are unremarkable.  There is no clinical concern  for breast cancer recurrence. -Continue surveillance and tamoxifen -Follow-up in 6 months, or sooner if needed  Osteopenia -Her 06/2019 DEXA shows osteopenia at forearm radius with T-score -1.6. She taking Vit D3.   -Previously reviewed the bone strengthening quality of tamoxifen  Repeated bone density scan from October 03, 2022 showed osteoporosis, with T-score -2.6 -We discussed medical treatment for osteoporosis, especially biphosphonate.  I recommended her to consider Zometa infusion every 6 months for 4 treatments, due to the benefit of reduced bone metastasis from breast cancer.  Potential side effect discussed with her in detail, including risk of jaw necrosis.  She will think about it.  I gave her the letter of dental clearance before Zometa, she will contact her dentist if she decides to proceed.     PLAN: -lab reviewed. -Discuss biphosphonate for osteoporosis, especially Zometa. -Discuss Tamoxifen and its side effects, she will continue, I refilled for her. - I change Tamoxifen 10 mg (probably incidental mistake) back to 20 mg daily  -lab and f/u in 6 months -I encouraged her to think about the Zometa infusion, and she will contact her dentist for dental clearance if she is interested.   SUMMARY OF ONCOLOGIC HISTORY: Oncology History Overview Note  Cancer Staging Malignant neoplasm of upper-outer quadrant of left breast in female, estrogen receptor positive ([place]) Staging form: Breast, AJCC 8th Edition - Clinical stage from 07/26/2020: Stage IA (cT1b, cN0, cM0, G2, ER+, PR+, HER2-) - Signed by Truitt Merle, MD on 08/09/2020    Malignant neoplasm of upper-outer quadrant of left breast in female, estrogen receptor positive ([place])  07/04/2020 Mammogram   IMPRESSION: Indeterminate 4 x 8 x 5 mm irregular hypoechoic mass left breast 1 o'clock position 4 cm from the  nipple.   07/26/2020 Cancer Staging   Staging form: Breast, AJCC 8th Edition - Clinical stage from 07/26/2020: Stage IA  (cT1b, cN0, cM0, G2, ER+, PR+, HER2-) - Signed by Truitt Merle, MD on 08/09/2020   07/26/2020 Initial Biopsy   Diagnosis Breast, left, needle core biopsy, upper outer 1 o'clock - INVASIVE DUCTAL CARCINOMA - SEE COMMENT Microscopic Comment Based on the biopsy, the carcinoma appears Nottingham grade 2 of 3 and measures 0.7 cm in greatest linear extent. Prognostic markers (ER/PR/ki-67/HER2) are pending and will be reported in an addendum. Dr. Jeannie Done reviewed the case and agrees with the above diagnosis. These results were called to [facility] on July 27, 2018.   07/26/2020 Receptors her2   PROGNOSTIC INDICATORS Results: IMMUNOHISTOCHEMICAL AND MORPHOMETRIC ANALYSIS PERFORMED MANUALLY The tumor cells are NEGATIVE for Her2 (1+). Estrogen Receptor: 90%, POSITIVE, STRONG STAINING INTENSITY Progesterone Receptor: 80%, POSITIVE, STRONG STAINING INTENSITY Proliferation Marker Ki67: 2%   08/03/2020 Initial Diagnosis   Malignant neoplasm of upper-outer quadrant of left breast in female, estrogen receptor positive ([place])   09/08/2020 Surgery   LEFT BREAST LUMPECTOMY WITH RADIOACTIVE SEED LOCALIZATION by Dr Barry Dienes   09/08/2020 Pathology Results   FINAL MICROSCOPIC DIAGNOSIS:   A. 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  - Margins not involved.  - Invasive carcinoma 0.1 cm from anterior margin.  - Biopsy site and biopsy clip.  - Fibrocystic changes.  - See oncology table.     09/2020 -  Anti-estrogen oral therapy   Tamoxifen 20mg  daily starting 09/2020   12/27/2020 Cancer Staging   Staging form: Breast, AJCC 8th Edition - Pathologic: Stage Unknown (pT1b, pNX, cM0, G1, ER+, PR+, HER2-) - Signed by Alla Feeling, NP on 12/27/2020 Histologic grading system: 3 grade system   12/29/2020 Survivorship   SCP delivered by Cira Rue, NP    01/17/2021 Genetic Testing   Negative genetic testing:  No pathogenic variants detected on the [facility]  CancerNext-Expanded + RNAinsight panel. The report date is 01/17/2021.   The CancerNext-Expanded + RNAinsight gene panel offered by [employer] and includes sequencing and rearrangement analysis for the following 77 genes: AIP, ALK, APC, ATM, AXIN2, BAP1, BARD1, BLM, BMPR1A, BRCA1, BRCA2, BRIP1, CDC73, CDH1, CDK4, CDKN1B, CDKN2A, CHEK2, CTNNA1, DICER1, FANCC, FH, FLCN, GALNT12, KIF1B, LZTR1, MAX, MEN1, MET, MLH1, MSH2, MSH3, MSH6, MUTYH, NBN, NF1, NF2, NTHL1, PALB2, PHOX2B, PMS2, POT1, PRKAR1A, PTCH1, PTEN, RAD51C, RAD51D, RB1, RECQL, RET, SDHA, SDHAF2, SDHB, SDHC, SDHD, SMAD4, SMARCA4, SMARCB1, SMARCE1, STK11, SUFU, TMEM127, TP53, TSC1, TSC2, VHL and XRCC2 (sequencing and deletion/duplication); EGFR, EGLN1, HOXB13, KIT, MITF, PDGFRA, POLD1 and POLE (sequencing only); EPCAM and GREM1 (deletion/duplication only). RNA data is routinely analyzed for use in variant interpretation for all genes.      INTERVAL HISTORY:  KOURTNEY KNAPPER is here for a follow up of  left breast cancer She was last seen by me on 04/02/2022 She presents to the clinic alone. Pt reports she is doing well. Pt state she has no problems while taking Tamoxifen. Pt state she was diagnose with Hypothyroidism.     All other systems were reviewed with the patient and are negative.  MEDICAL HISTORY:  Past Medical History:  Diagnosis Date   Anxiety and depression    Arthritis    Blood transfusion without reported diagnosis 1989   GB hemorrage    Cataract    Bil/no surgery yet.   Coccyx pain    Secondary to scar tissue from old  pressure ulcer after back surgery, Dr. Brantley Stage   Coronary artery disease    Depression 2010   panic attacks and depression   Dizziness    severe: MRI chronic isch. changes and mastoiditis- saw Dr.Mundy(2007)  STATES SHE STILL HAS EPISODES- ESPECIALLY IF SHE GETS UP TOO QUICKLY AFTER LYING DOWN   Eczema    Family history of breast cancer    Family history of kidney cancer    Family history of lung  cancer    Family history of melanoma    Family history of stomach cancer    Family history of throat cancer    GERD (gastroesophageal reflux disease)    Goiter    Headache(784.0)    Hyperlipidemia    Hypertension    Pain    PAIN LOWER BACK AND DOWN RIGHT LEG WITH NUMBNESS, TINGLING RT LEG AND FOOT--STENOSIS   Partial tear of right rotator cuff    & labral tear   S/P cardiac cath 2009   Revealing nonobstructive CAD w/ tubular, discrete 40% mid lesion in the circumflex; discrete 30% proximal lesion in the LAD; luminal irregularities, 35% mid lesion in RCA; and generic 40% mid lesion in the RCA. Medical treatment recommended.   S/P cardiac cath 11/24/2014   Revealing nonobstructive CAD w/ tubular, discrete 40% mid lesion in the circumflex; discrete 30% proximal lesion in the LAD; luminal irregularities, 35% mid lesion in RCA; and generic 40% mid lesion in the RCA. Medical treatment recommended.   Stroke [facility]) 2004   mild TIA    SURGICAL HISTORY: Past Surgical History:  Procedure Laterality Date   ABDOMINAL HYSTERECTOMY     oophorectomy L only   BREAST CYST ASPIRATION Left    BREAST CYST EXCISION Left    BREAST LUMPECTOMY     BREAST LUMPECTOMY WITH RADIOACTIVE SEED LOCALIZATION Left 09/08/2020   Procedure: LEFT BREAST LUMPECTOMY WITH RADIOACTIVE SEED LOCALIZATION;  Surgeon: Stark Klein, MD;  Location: [facility];  Service: General;  Laterality: Left;   BUNIONECTOMY  1990s   LEFT   CHOLECYSTECTOMY     HAND SURGERY  1990s   R hand x 2, L hand x 1   LUMBAR LAMINECTOMY/DECOMPRESSION MICRODISCECTOMY N/A 11/13/2012   Procedure: MICRO LUMBAR DECOMPRESSION L4 - L5 AND L2 - L3 2 LEVELS;  Surgeon: Johnn Hai, MD;  Location: [facility];  Service: Orthopedics;  Laterality: N/A;   SHOULDER SURGERY  2006   left     I have reviewed the social history and family history with the patient and they are unchanged from previous note.  ALLERGIES:  is allergic to codeine,  hydrocodone, and sertraline hcl.  MEDICATIONS:  Current Outpatient Medications  Medication Sig Dispense Refill   tamoxifen (NOLVADEX) 20 MG tablet Take 1 tablet (20 mg total) by mouth daily. 90 tablet 1   acetaminophen (TYLENOL) 500 MG tablet Take 500 mg by mouth every 6 (six) hours as needed.     ALPRAZolam (XANAX) 0.25 MG tablet Take 1-2 tablets (0.25-0.5 mg total) by mouth at bedtime as needed for anxiety or sleep. 30 tablet 1   amLODipine (NORVASC) 10 MG tablet Take 1 tablet (10 mg total) by mouth daily. 90 tablet 1   aspirin 81 MG chewable tablet Chew by mouth daily.     calcium-vitamin D 250-100 MG-UNIT tablet Take 1 tablet by mouth 2 (two) times daily.     escitalopram (LEXAPRO) 20 MG tablet Take 0.5 tablets (10 mg total) by mouth daily. 45 tablet 1  famotidine (PEPCID) 20 MG tablet TAKE 1 TABLET(20 MG) BY MOUTH TWICE DAILY 180 tablet 1   fluticasone furoate-vilanterol (BREO ELLIPTA) 200-25 MCG/INH AEPB Inhale 1 puff into the lungs daily. 60 each 11   levothyroxine (SYNTHROID) 50 MCG tablet Take 1 tablet (50 mcg total) by mouth daily before breakfast. 90 tablet 1   simvastatin (ZOCOR) 20 MG tablet TAKE 1 TABLET(20 MG) BY MOUTH AT BEDTIME 90 tablet 1   No current facility-administered medications for this visit.    PHYSICAL EXAMINATION: ECOG PERFORMANCE STATUS: 0 - Asymptomatic  Vitals:   10/08/22 1340  BP: 125/70  Pulse: 92  Resp: 19  Temp: 98 F (36.7 C)  SpO2: 97%   Wt Readings from Last 3 Encounters:  10/08/22 138 lb (62.6 kg)  09/06/22 139 lb (63 kg)  08/07/22 143 lb (64.9 kg)     LYMPH: (-) no palpable lymphadenopathy in the cervical, axillary  LUNGS: (-)clear to auscultation and percussion with normal breathing effort HEART: (-) regular rate & rhythm and (+) murmurs and no lower extremity edema  BREAST: Rt breast  no palpable mass, Lt breast little scar tissue lump 1 cm 12 o'clock position above the nipple. No palpable mas breast exam benign.    LABORATORY  DATA:  I have reviewed the data as listed    Latest Ref Rng & Units 10/08/2022    1:04 PM 04/02/2022   11:25 AM 09/28/2021   10:11 AM  CBC  WBC 4.0 - 10.5 K/uL 7.2  7.8  8.0   Hemoglobin 12.0 - 15.0 g/dL 13.9  13.9  14.3   Hematocrit 36.0 - 46.0 % 40.5  40.3  42.3   Platelets 150 - 400 K/uL 116  112  103         Latest Ref Rng & Units 10/08/2022    1:04 PM 04/02/2022   11:25 AM 09/28/2021   10:11 AM  CMP  Glucose 70 - 99 mg/dL 135  114  138   BUN 8 - 23 mg/dL 15  11  14    Creatinine 0.44 - 1.00 mg/dL 0.79  0.81  0.74   Sodium 135 - 145 mmol/L 137  138  140   Potassium 3.5 - 5.1 mmol/L 3.8  3.9  3.7   Chloride 98 - 111 mmol/L 104  106  108   CO2 22 - 32 mmol/L 26  28  25    Calcium 8.9 - 10.3 mg/dL 9.0  9.4  9.2   Total Protein 6.5 - 8.1 g/dL 6.2  6.2  6.3   Total Bilirubin 0.3 - 1.2 mg/dL 0.4  0.3  0.5   Alkaline Phos 38 - 126 U/L 57  53  68   AST 15 - 41 U/L 12  13  12    ALT 0 - 44 U/L 7  6  6        RADIOGRAPHIC STUDIES: I have personally reviewed the radiological images as listed and agreed with the findings in the report. No results found.    No orders of the defined types were placed in this encounter.  All questions were answered. The patient knows to call the clinic with any problems, questions or concerns. No barriers to learning was detected. The total time spent in the appointment was 30 minutes.     Truitt Merle, MD 10/08/2022   Felicity Coyer, CMA, am acting as scribe for Truitt Merle, MD.   I have reviewed the above documentation for accuracy and completeness, and I agree with  the above.

## 2022-10-10 ENCOUNTER — Telehealth: Payer: Self-pay | Admitting: Internal Medicine

## 2022-10-10 NOTE — Telephone Encounter (Signed)
Patient: [name] Phone: [phone] Provider: Dr Oneida Alar patient to schedule her AWV.  She stated that she had DEX scan in March.  She stated she to breast center for treatment and the doctor stated she need infusion done every six.  She concerned about the side effects and would like your input on the decision to do the infusion. Please advise.

## 2022-10-10 NOTE — Telephone Encounter (Signed)
I see that she was recommended Zometa for the treatment of osteoporosis, the conversation included benefits and risks. I agree with the advice.

## 2022-10-10 NOTE — Telephone Encounter (Signed)
Spoke w/ Pt- informed of PCP recommendation. She wanted to discuss likely hood that side effects would happen. Informed her I didn't know- recommended she discuss this with ordering provider. Pt verbalized understanding.

## 2022-10-15 ENCOUNTER — Other Ambulatory Visit: Payer: Self-pay | Admitting: Internal Medicine

## 2022-10-22 ENCOUNTER — Ambulatory Visit (INDEPENDENT_AMBULATORY_CARE_PROVIDER_SITE_OTHER): Payer: Medicare Other | Admitting: *Deleted

## 2022-10-22 DIAGNOSIS — Z0000 Encounter for general adult medical examination without abnormal findings: Secondary | ICD-10-CM | POA: Diagnosis not present

## 2022-10-22 NOTE — Progress Notes (Signed)
I have reviewed and agree with [facility] documentation.  Kwane Rohl, MD  

## 2022-10-22 NOTE — Patient Instructions (Signed)
[name] , Thank you for taking time to come for your Medicare Wellness Visit. I appreciate your ongoing commitment to your health goals. Please review the following plan we discussed and let me know if I can assist you in the future.     This is a list of the screening recommended for you and due dates:  Health Maintenance  Topic Date Due   DTaP/Tdap/Td vaccine (2 - Td or Tdap) 12/19/2020   Flu Shot  02/21/2023   Mammogram  10/03/2023   Medicare Annual Wellness Visit  10/22/2023   DEXA scan (bone density measurement)  10/03/2027   Pneumonia Vaccine  Completed   Hepatitis C Screening: USPSTF Recommendation to screen - Ages 69-79 yo.  Completed   HPV Vaccine  Aged Out   COVID-19 Vaccine  Discontinued   Cologuard (Stool DNA test)  Discontinued   Zoster (Shingles) Vaccine  Discontinued    Next appointment: Follow up in one year for your annual wellness visit.   Preventive Care 21 Years and Older, Female Preventive care refers to lifestyle choices and visits with your health care provider that can promote health and wellness. What does preventive care include? A yearly physical exam. This is also called an annual well check. Dental exams once or twice a year. Routine eye exams. Ask your health care provider how often you should have your eyes checked. Personal lifestyle choices, including: Daily care of your teeth and gums. Regular physical activity. Eating a healthy diet. Avoiding tobacco and drug use. Limiting alcohol use. Practicing safe sex. Taking low-dose aspirin every day. Taking vitamin and mineral supplements as recommended by your health care provider. What happens during an annual well check? The services and screenings done by your health care provider during your annual well check will depend on your age, overall health, lifestyle risk factors, and family history of disease. Counseling  Your health care provider may ask you questions about your: Alcohol  use. Tobacco use. Drug use. Emotional well-being. Home and relationship well-being. Sexual activity. Eating habits. History of falls. Memory and ability to understand (cognition). Work and work Statistician. Reproductive health. Screening  You may have the following tests or measurements: Height, weight, and BMI. Blood pressure. Lipid and cholesterol levels. These may be checked every 5 years, or more frequently if you are over 41 years old. Skin check. Lung cancer screening. You may have this screening every year starting at age 9 if you have a 30-pack-year history of smoking and currently smoke or have quit within the past 15 years. Fecal occult blood test (FOBT) of the stool. You may have this test every year starting at age 70. Flexible sigmoidoscopy or colonoscopy. You may have a sigmoidoscopy every 5 years or a colonoscopy every 10 years starting at age 56. Hepatitis C blood test. Hepatitis B blood test. Sexually transmitted disease (STD) testing. Diabetes screening. This is done by checking your blood sugar (glucose) after you have not eaten for a while (fasting). You may have this done every 1-3 years. Bone density scan. This is done to screen for osteoporosis. You may have this done starting at age 25. Mammogram. This may be done every 1-2 years. Talk to your health care provider about how often you should have regular mammograms. Talk with your health care provider about your test results, treatment options, and if necessary, the need for more tests. Vaccines  Your health care provider may recommend certain vaccines, such as: Influenza vaccine. This is recommended every year. Tetanus, diphtheria,  and acellular pertussis (Tdap, Td) vaccine. You may need a Td booster every 10 years. Zoster vaccine. You may need this after age 53. Pneumococcal 13-valent conjugate (PCV13) vaccine. One dose is recommended after age 62. Pneumococcal polysaccharide (PPSV23) vaccine. One dose is  recommended after age 83. Talk to your health care provider about which screenings and vaccines you need and how often you need them. This information is not intended to replace advice given to you by your health care provider. Make sure you discuss any questions you have with your health care provider. Document Released: 08/05/2015 Document Revised: 03/28/2016 Document Reviewed: 05/10/2015 Elsevier Interactive Patient Education  2017 [place] Prevention in the Home Falls can cause injuries. They can happen to people of all ages. There are many things you can do to make your home safe and to help prevent falls. What can I do on the outside of my home? Regularly fix the edges of walkways and driveways and fix any cracks. Remove anything that might make you trip as you walk through a door, such as a raised step or threshold. Trim any bushes or trees on the path to your home. Use bright outdoor lighting. Clear any walking paths of anything that might make someone trip, such as rocks or tools. Regularly check to see if handrails are loose or broken. Make sure that both sides of any steps have handrails. Any raised decks and porches should have guardrails on the edges. Have any leaves, snow, or ice cleared regularly. Use sand or salt on walking paths during winter. Clean up any spills in your garage right away. This includes oil or grease spills. What can I do in the bathroom? Use night lights. Install grab bars by the toilet and in the tub and shower. Do not use towel bars as grab bars. Use non-skid mats or decals in the tub or shower. If you need to sit down in the shower, use a plastic, non-slip stool. Keep the floor dry. Clean up any water that spills on the floor as soon as it happens. Remove soap buildup in the tub or shower regularly. Attach bath mats securely with double-sided non-slip rug tape. Do not have throw rugs and other things on the floor that can make you  trip. What can I do in the bedroom? Use night lights. Make sure that you have a light by your bed that is easy to reach. Do not use any sheets or blankets that are too big for your bed. They should not hang down onto the floor. Have a firm chair that has side arms. You can use this for support while you get dressed. Do not have throw rugs and other things on the floor that can make you trip. What can I do in the kitchen? Clean up any spills right away. Avoid walking on wet floors. Keep items that you use a lot in easy-to-reach places. If you need to reach something above you, use a strong step stool that has a grab bar. Keep electrical cords out of the way. Do not use floor polish or wax that makes floors slippery. If you must use wax, use non-skid floor wax. Do not have throw rugs and other things on the floor that can make you trip. What can I do with my stairs? Do not leave any items on the stairs. Make sure that there are handrails on both sides of the stairs and use them. Fix handrails that are broken or loose. Make sure  that handrails are as long as the stairways. Check any carpeting to make sure that it is firmly attached to the stairs. Fix any carpet that is loose or worn. Avoid having throw rugs at the top or bottom of the stairs. If you do have throw rugs, attach them to the floor with carpet tape. Make sure that you have a light switch at the top of the stairs and the bottom of the stairs. If you do not have them, ask someone to add them for you. What else can I do to help prevent falls? Wear shoes that: Do not have high heels. Have rubber bottoms. Are comfortable and fit you well. Are closed at the toe. Do not wear sandals. If you use a stepladder: Make sure that it is fully opened. Do not climb a closed stepladder. Make sure that both sides of the stepladder are locked into place. Ask someone to hold it for you, if possible. Clearly mark and make sure that you can  see: Any grab bars or handrails. First and last steps. Where the edge of each step is. Use tools that help you move around (mobility aids) if they are needed. These include: Canes. Walkers. Scooters. Crutches. Turn on the lights when you go into a dark area. Replace any light bulbs as soon as they burn out. Set up your furniture so you have a clear path. Avoid moving your furniture around. If any of your floors are uneven, fix them. If there are any pets around you, be aware of where they are. Review your medicines with your doctor. Some medicines can make you feel dizzy. This can increase your chance of falling. Ask your doctor what other things that you can do to help prevent falls. This information is not intended to replace advice given to you by your health care provider. Make sure you discuss any questions you have with your health care provider. Document Released: 05/05/2009 Document Revised: 12/15/2015 Document Reviewed: 08/13/2014 Elsevier Interactive Patient Education  2017 [employer].

## 2022-10-22 NOTE — Progress Notes (Signed)
Subjective:   [name] is a 77 y.o. female who presents for Medicare Annual (Subsequent) preventive examination.  I connected with  [name] on 10/22/22 by a audio enabled telemedicine application and verified that I am speaking with the correct person using two identifiers.  Patient Location: Home  Provider Location: Office/Clinic  I discussed the limitations of evaluation and management by telemedicine. The patient expressed understanding and agreed to proceed.   Review of Systems     Cardiac Risk Factors include: advanced age (>16men, >59 women);dyslipidemia;hypertension     Objective:    There were no vitals filed for this visit. There is no height or weight on file to calculate BMI.     10/22/2022   11:02 AM 10/17/2021    1:07 PM 12/29/2020   12:35 PM 09/08/2020   11:40 AM 08/18/2020    1:09 PM 08/10/2020    4:49 PM 07/21/2020    1:34 PM  Advanced Directives  Does Patient Have a Medical Advance Directive? No Yes No Yes Yes No No  Type of Visual merchandiser of [place];Living will    Does patient want to make changes to medical advance directive?  Yes (MAU/Ambulatory/Procedural Areas - Information given) No - Patient declined No - Patient declined No - Patient declined  Yes (MAU/Ambulatory/Procedural Areas - Information given)  Copy of [place] in Chart?    No - copy requested     Would patient like information on creating a medical advance directive? No - Patient declined  No - Patient declined        Current Medications (verified) Outpatient Encounter Medications as of 10/22/2022  Medication Sig   acetaminophen (TYLENOL) 500 MG tablet Take 500 mg by mouth every 6 (six) hours as needed.   ALPRAZolam (XANAX) 0.25 MG tablet Take 1-2 tablets (0.25-0.5 mg total) by mouth at bedtime as needed for anxiety or sleep.   amLODipine (NORVASC) 10 MG tablet Take 1 tablet (10 mg total) by mouth daily.    aspirin 81 MG chewable tablet Chew by mouth daily.   calcium-vitamin D 250-100 MG-UNIT tablet Take 1 tablet by mouth 2 (two) times daily.   escitalopram (LEXAPRO) 20 MG tablet Take 0.5 tablets (10 mg total) by mouth daily.   famotidine (PEPCID) 20 MG tablet TAKE 1 TABLET(20 MG) BY MOUTH TWICE DAILY   fluticasone furoate-vilanterol (BREO ELLIPTA) 200-25 MCG/INH AEPB Inhale 1 puff into the lungs daily.   levothyroxine (SYNTHROID) 50 MCG tablet Take 1 tablet (50 mcg total) by mouth daily before breakfast.   simvastatin (ZOCOR) 20 MG tablet TAKE 1 TABLET(20 MG) BY MOUTH AT BEDTIME   tamoxifen (NOLVADEX) 20 MG tablet Take 1 tablet (20 mg total) by mouth daily.   No facility-administered encounter medications on file as of 10/22/2022.    Allergies (verified) Codeine, Hydrocodone, and Sertraline hcl   History: Past Medical History:  Diagnosis Date   Anxiety and depression    Arthritis    Blood transfusion without reported diagnosis 1989   GB hemorrage    Cataract    Bil/no surgery yet.   Coccyx pain    Secondary to scar tissue from old pressure ulcer after back surgery, Dr. Brantley Stage   Coronary artery disease    Depression 2010   panic attacks and depression   Dizziness    severe: MRI chronic isch. changes and mastoiditis- saw Dr.Mundy(2007)  STATES SHE STILL HAS EPISODES- ESPECIALLY IF SHE GETS  UP TOO QUICKLY AFTER LYING DOWN   Eczema    Family history of breast cancer    Family history of kidney cancer    Family history of lung cancer    Family history of melanoma    Family history of stomach cancer    Family history of throat cancer    GERD (gastroesophageal reflux disease)    Goiter    Headache(784.0)    Hyperlipidemia    Hypertension    Pain    PAIN LOWER BACK AND DOWN RIGHT LEG WITH NUMBNESS, TINGLING RT LEG AND FOOT--STENOSIS   Partial tear of right rotator cuff    & labral tear   S/P cardiac cath 2009   Revealing nonobstructive CAD w/ tubular, discrete 40% mid lesion in  the circumflex; discrete 30% proximal lesion in the LAD; luminal irregularities, 35% mid lesion in RCA; and generic 40% mid lesion in the RCA. Medical treatment recommended.   S/P cardiac cath 11/24/2014   Revealing nonobstructive CAD w/ tubular, discrete 40% mid lesion in the circumflex; discrete 30% proximal lesion in the LAD; luminal irregularities, 35% mid lesion in RCA; and generic 40% mid lesion in the RCA. Medical treatment recommended.   Stroke 2004   mild TIA   Past Surgical History:  Procedure Laterality Date   ABDOMINAL HYSTERECTOMY     oophorectomy L only   BREAST CYST ASPIRATION Left    BREAST CYST EXCISION Left    BREAST LUMPECTOMY     BREAST LUMPECTOMY WITH RADIOACTIVE SEED LOCALIZATION Left 09/08/2020   Procedure: LEFT BREAST LUMPECTOMY WITH RADIOACTIVE SEED LOCALIZATION;  Surgeon: Stark Klein, MD;  Location: [facility];  Service: General;  Laterality: Left;   BUNIONECTOMY  1990s   LEFT   CHOLECYSTECTOMY     HAND SURGERY  1990s   R hand x 2, L hand x 1   LUMBAR LAMINECTOMY/DECOMPRESSION MICRODISCECTOMY N/A 11/13/2012   Procedure: MICRO LUMBAR DECOMPRESSION L4 - L5 AND L2 - L3 2 LEVELS;  Surgeon: Johnn Hai, MD;  Location: [facility];  Service: Orthopedics;  Laterality: N/A;   SHOULDER SURGERY  2006   left    Family History  Problem Relation Age of Onset   Throat cancer Other        cousin   Heart attack Half-Brother        2 brother s, CABG   Heart disease Half-Brother    Lung cancer Mother        dx late 33s, cousin   Melanoma Mother        multiple, first diagnosed 69s   Lung cancer Brother    Throat cancer Brother 19   Cataracts Father    Heart disease Half-Brother    Kidney cancer Maternal Aunt 31   Cancer Maternal Grandfather        NOS, dx 6s   Stomach cancer Maternal Uncle    Breast cancer Cousin        dx >50, maternal first cousin   Breast cancer Cousin        dx >50, maternal first cousin   Colon cancer Neg Hx    Diabetes  Neg Hx    Social History   Socioeconomic History   Marital status: Widowed    Spouse name: Not on file   Number of children: 4   Years of education: Not on file   Highest education level: Not on file  Occupational History   Occupation: retired     Fish farm manager: RETIRED  Tobacco Use   Smoking status: Former    Packs/day: 1.00    Years: 20.00    Additional pack years: 0.00    Total pack years: 20.00    Types: Cigarettes    Quit date: 2000    Years since quitting: 24.2   Smokeless tobacco: Never   Tobacco comments:    quit aprox 2000 after 30 years, 1 to 1.5 ppd  Substance and Sexual Activity   Alcohol use: No   Drug use: No   Sexual activity: Not Currently  Other Topics Concern   Not on file  Social History Narrative   Lost husband [name]) 12/2018; 2 daughters. 2 sons    youngest son lives w/her    Social Determinants of Health   Financial Resource Strain: Low Risk  (10/17/2021)   Overall Financial Resource Strain (CARDIA)    Difficulty of Paying Living Expenses: Not hard at all  Food Insecurity: No Food Insecurity (10/22/2022)   Hunger Vital Sign    Worried About Running Out of Food in the Last Year: Never true    Ran Out of Food in the Last Year: Never true  Transportation Needs: No Transportation Needs (10/22/2022)   PRAPARE - Hydrologist (Medical): No    Lack of Transportation (Non-Medical): No  Physical Activity: Insufficiently Active (10/17/2021)   Exercise Vital Sign    Days of Exercise per Week: 3 days    Minutes of Exercise per Session: 20 min  Stress: Stress Concern Present (10/17/2021)   [place]    Feeling of Stress : To some extent  Social Connections: Unknown (10/17/2021)   Social Connection and Isolation Panel [NHANES]    Frequency of Communication with Friends and Family: More than three times a week    Frequency of Social Gatherings with Friends and Family: Three  times a week    Attends Religious Services: 1 to 4 times per year    Active Member of Clubs or Organizations: No    Attends Archivist Meetings: Never    Marital Status: Not on file    Tobacco Counseling Counseling given: Not Answered Tobacco comments: quit aprox 2000 after 30 years, 1 to 1.5 ppd   Clinical Intake:  Pre-visit preparation completed: Yes  Pain : No/denies pain  Nutritional Risks: None Diabetes: No  How often do you need to have someone help you when you read instructions, pamphlets, or other written materials from your doctor or pharmacy?: 1 - Never  Activities of Daily Living    10/22/2022   11:05 AM  In your present state of health, do you have any difficulty performing the following activities:  Hearing? 1  Comment slight hearing loss in right ear  Vision? 0  Difficulty concentrating or making decisions? 0  Walking or climbing stairs? 0  Dressing or bathing? 0  Doing errands, shopping? 1  Comment has panic attacks  Preparing Food and eating ? N  Using the Toilet? N  In the past six months, have you accidently leaked urine? Y  Comment sometimes  Do you have problems with loss of bowel control? N  Managing your Medications? N  Managing your Finances? N  Housekeeping or managing your Housekeeping? N    Patient Care Team: Colon Branch, MD as PCP - General [name], Nadara Mustard, MD as Consulting Physician (Gynecology) Ladene Artist, MD as Consulting Physician (Gastroenterology) Erroll Luna, MD as Consulting Physician (General Surgery) Delilah Shan,  Baruch Merl, MD as Attending Physician (Orthopedic Surgery) Netta Cedars, MD as Consulting Physician (Orthopedic Surgery) Linda Hedges, DO as Consulting Physician (Obstetrics and Gynecology) Mauro Kaufmann, RN as Oncology Nurse Navigator Rockwell Germany, RN as Oncology Nurse Navigator Stark Klein, MD as Consulting Physician (General Surgery) Truitt Merle, MD as Consulting Physician (Hematology) Eppie Gibson, MD as Attending Physician (Radiation Oncology) Edythe Clarity, [facility] (Pharmacist) Edythe Clarity, [facility] (Pharmacist) Alla Feeling, NP as Nurse Practitioner (Nurse Practitioner)  Indicate any recent Medical Services you may have received from other than [facility] providers in the past year (date may be approximate).     Assessment:   This is a routine wellness examination for Fayola.  Hearing/Vision screen No results found.  Dietary issues and exercise activities discussed: Current Exercise Habits: Home exercise routine, Type of exercise: walking;Other - see comments (eliptical), Time (Minutes): 30, Frequency (Times/Week): 4, Weekly Exercise (Minutes/Week): 120, Intensity: Mild, Exercise limited by: None identified   Goals Addressed   None    Depression Screen    10/22/2022   11:05 AM 08/07/2022    2:28 PM 11/23/2021   10:22 AM 10/17/2021    1:06 PM 02/01/2021    2:23 PM 10/26/2020    9:37 AM 07/21/2020    1:36 PM  PHQ 2/9 Scores  PHQ - 2 Score 0 1 2 2 2  0 0  PHQ- 9 Score  4 3  4  0     Fall Risk    10/22/2022   11:03 AM 08/07/2022    2:07 PM 02/06/2022   10:55 AM 10/17/2021    1:09 PM 02/01/2021    1:43 PM  Fall Risk   Falls in the past year? 0 0 0 0 0  Number falls in past yr: 0 0 0 0 0  Injury with Fall? 0 0 0 0 0  Risk for fall due to : No Fall Risks   Impaired vision;Impaired balance/gait   Follow up Falls evaluation completed Falls evaluation completed Falls evaluation completed Falls prevention discussed Falls evaluation completed    FALL RISK PREVENTION PERTAINING TO THE HOME:  Any stairs in or around the home? Yes  If so, are there any without handrails? No  Home free of loose throw rugs in walkways, pet beds, electrical cords, etc? Yes  Adequate lighting in your home to reduce risk of falls? Yes   ASSISTIVE DEVICES UTILIZED TO PREVENT FALLS:  Life alert? No  Use of a cane, walker or w/c? No  Grab bars in the bathroom? Yes  Shower chair or bench in  shower?  Built in bench Elevated toilet seat or a handicapped toilet? Yes   TIMED UP AND GO:  Was the test performed?  No, audio visit .   Cognitive Function:    10/25/2017   10:49 AM  MMSE - Mini Mental State Exam  Orientation to time 5  Orientation to Place 5  Registration 3  Attention/ Calculation 5  Recall 3  Language- name 2 objects 2  Language- repeat 1  Language- follow 3 step command 3  Language- read & follow direction 1  Write a sentence 1  Copy design 1  Total score 30        10/22/2022   11:11 AM 10/17/2021    1:11 PM  6CIT Screen  What Year? 0 points 0 points  What month? 0 points 0 points  What time? 0 points 0 points  Count back from 20 0 points 0 points  Months in reverse 0 points 4 points  Repeat phrase 0 points 0 points  Total Score 0 points 4 points    Immunizations Immunization History  Administered Date(s) Administered   Fluad Quad(high Dose 65+) 04/28/2019, 04/27/2020   Influenza Whole 05/09/2007, 07/18/2009, 06/12/2010   Influenza, High Dose Seasonal PF 06/01/2015, 05/23/2017, 05/08/2018   Influenza, Seasonal, Injecte, Preservative Fre 08/01/2012   Influenza,inj,Quad PF,6+ Mos 04/07/2014   Pneumococcal Conjugate-13 12/03/2013   Pneumococcal Polysaccharide-23 07/18/2009   Tdap 12/20/2010   Zoster, Live 12/26/2011    TDAP status: Due, Education has been provided regarding the importance of this vaccine. Advised may receive this vaccine at local pharmacy or Health Dept. Aware to provide a copy of the vaccination record if obtained from local pharmacy or Health Dept. Verbalized acceptance and understanding.  Flu Vaccine status: Up to date  Pneumococcal vaccine status: Up to date  Covid-19 vaccine status: Declined, Education has been provided regarding the importance of this vaccine but patient still declined. Advised may receive this vaccine at local pharmacy or Health Dept.or vaccine clinic. Aware to provide a copy of the vaccination record  if obtained from local pharmacy or Health Dept. Verbalized acceptance and understanding.  Qualifies for Shingles Vaccine? Yes   Zostavax completed Yes   Shingrix Completed?: No.    Education has been provided regarding the importance of this vaccine. Patient has been advised to call insurance company to determine out of pocket expense if they have not yet received this vaccine. Advised may also receive vaccine at local pharmacy or Health Dept. Verbalized acceptance and understanding.  Screening Tests Health Maintenance  Topic Date Due   DTaP/Tdap/Td (2 - Td or Tdap) 12/19/2020   Medicare Annual Wellness (AWV)  10/18/2022   INFLUENZA VACCINE  02/21/2023   MAMMOGRAM  10/03/2023   DEXA SCAN  10/03/2027   Pneumonia Vaccine 4+ Years old  Completed   Hepatitis C Screening  Completed   HPV VACCINES  Aged Out   COVID-19 Vaccine  Discontinued   Fecal DNA (Cologuard)  Discontinued   Zoster Vaccines- Shingrix  Discontinued    Health Maintenance  Health Maintenance Due  Topic Date Due   DTaP/Tdap/Td (2 - Td or Tdap) 12/19/2020   Medicare Annual Wellness (AWV)  10/18/2022    Colorectal cancer screening: No longer required.   Mammogram status: Completed 10/03/22. Repeat every year  Bone Density status: Completed 10/03/22. Results reflect: Bone density results: OSTEOPOROSIS. Repeat every 2 years.  Lung Cancer Screening: (Low Dose CT Chest recommended if Age 79-80 years, 30 pack-year currently smoking OR have quit w/in 15years.) does not qualify.   Additional Screening:  Hepatitis C Screening: does qualify; Completed 09/10/16  Vision Screening: Recommended annual ophthalmology exams for early detection of glaucoma and other disorders of the eye. Is the patient up to date with their annual eye exam?  Yes  Who is the provider or what is the name of the office in which the patient attends annual eye exams? [facility] If pt is not established with a provider, would they like to be referred  to a provider to establish care? No .   Dental Screening: Recommended annual dental exams for proper oral hygiene  Community Resource Referral / Chronic Care Management: CRR required this visit?  No   CCM required this visit?  No      Plan:     I have personally reviewed and noted the following in the patient's chart:   Medical and social history Use of  alcohol, tobacco or illicit drugs  Current medications and supplements including opioid prescriptions. Patient is not currently taking opioid prescriptions. Functional ability and status Nutritional status Physical activity Advanced directives List of other physicians Hospitalizations, surgeries, and ER visits in previous 12 months Vitals Screenings to include cognitive, depression, and falls Referrals and appointments  In addition, I have reviewed and discussed with patient certain preventive protocols, quality metrics, and best practice recommendations. A written personalized care plan for preventive services as well as general preventive health recommendations were provided to patient.   Due to this being a telephonic visit, the after visit summary with patients personalized plan was offered to patient via mail or my-chart. Patient would like to access on my-chart.  Beatris Ship, [place]   10/22/2022   Nurse Notes: None

## 2022-11-13 DIAGNOSIS — H9312 Tinnitus, left ear: Secondary | ICD-10-CM | POA: Insufficient documentation

## 2022-11-13 DIAGNOSIS — L299 Pruritus, unspecified: Secondary | ICD-10-CM | POA: Diagnosis not present

## 2022-11-13 DIAGNOSIS — H9113 Presbycusis, bilateral: Secondary | ICD-10-CM | POA: Diagnosis not present

## 2022-11-13 DIAGNOSIS — H903 Sensorineural hearing loss, bilateral: Secondary | ICD-10-CM | POA: Diagnosis not present

## 2022-12-07 ENCOUNTER — Ambulatory Visit (INDEPENDENT_AMBULATORY_CARE_PROVIDER_SITE_OTHER): Payer: Medicare Other | Admitting: Internal Medicine

## 2022-12-07 ENCOUNTER — Encounter: Payer: Self-pay | Admitting: Internal Medicine

## 2022-12-07 VITALS — BP 126/72 | HR 76 | Temp 98.2°F | Resp 16 | Ht 61.0 in | Wt 139.0 lb

## 2022-12-07 DIAGNOSIS — E785 Hyperlipidemia, unspecified: Secondary | ICD-10-CM

## 2022-12-07 DIAGNOSIS — R739 Hyperglycemia, unspecified: Secondary | ICD-10-CM | POA: Diagnosis not present

## 2022-12-07 DIAGNOSIS — E039 Hypothyroidism, unspecified: Secondary | ICD-10-CM

## 2022-12-07 DIAGNOSIS — R269 Unspecified abnormalities of gait and mobility: Secondary | ICD-10-CM | POA: Diagnosis not present

## 2022-12-07 DIAGNOSIS — J849 Interstitial pulmonary disease, unspecified: Secondary | ICD-10-CM

## 2022-12-07 LAB — TSH: TSH: 3.1 u[IU]/mL (ref 0.35–5.50)

## 2022-12-07 LAB — LIPID PANEL
Cholesterol: 157 mg/dL (ref 0–200)
HDL: 45.2 mg/dL (ref >39.00)
NonHDL: 112.17
Total CHOL/HDL Ratio: 3
Triglycerides: 202 mg/dL — ABNORMAL HIGH (ref 0.0–149.0)
VLDL: 40.4 mg/dL — ABNORMAL HIGH (ref 0.0–40.0)

## 2022-12-07 LAB — LDL CHOLESTEROL, DIRECT: Direct LDL: 77 mg/dL

## 2022-12-07 LAB — HEMOGLOBIN A1C: Hgb A1c MFr Bld: 5.5 % (ref 4.6–6.5)

## 2022-12-07 NOTE — Progress Notes (Unsigned)
Subjective:    Patient ID: [name], female    DOB: 1945-10-27, 77 y.o.   MRN: [id number]  DOS:  12/07/2022 Type of visit - description: Follow-up in general feeling well  Notes from oncology and pulmonary reviewed. In general feels well. Good med compliance. Reports that her gait is slightly wobbly, no falls.  Review of Systems See above   Past Medical History:  Diagnosis Date   Anxiety and depression    Arthritis    Blood transfusion without reported diagnosis 1989   GB hemorrage    Cataract    Bil/no surgery yet.   Coccyx pain    Secondary to scar tissue from old pressure ulcer after back surgery, Dr. Luisa Hart   Coronary artery disease    Depression 2010   panic attacks and depression   Dizziness    severe: MRI chronic isch. changes and mastoiditis- saw Dr.Mundy(2007)  STATES SHE STILL HAS EPISODES- ESPECIALLY IF SHE GETS UP TOO QUICKLY AFTER LYING DOWN   Eczema    Family history of breast cancer    Family history of kidney cancer    Family history of lung cancer    Family history of melanoma    Family history of stomach cancer    Family history of throat cancer    GERD (gastroesophageal reflux disease)    Goiter    Headache(784.0)    Hyperlipidemia    Hypertension    Pain    PAIN LOWER BACK AND DOWN RIGHT LEG WITH NUMBNESS, TINGLING RT LEG AND FOOT--STENOSIS   Partial tear of right rotator cuff    & labral tear   S/P cardiac cath 2009   Revealing nonobstructive CAD w/ tubular, discrete 40% mid lesion in the circumflex; discrete 30% proximal lesion in the LAD; luminal irregularities, 35% mid lesion in RCA; and generic 40% mid lesion in the RCA. Medical treatment recommended.   S/P cardiac cath 11/24/2014   Revealing nonobstructive CAD w/ tubular, discrete 40% mid lesion in the circumflex; discrete 30% proximal lesion in the LAD; luminal irregularities, 35% mid lesion in RCA; and generic 40% mid lesion in the RCA. Medical treatment recommended.   Stroke  [facility]) 2004   mild TIA    Past Surgical History:  Procedure Laterality Date   ABDOMINAL HYSTERECTOMY     oophorectomy L only   BREAST CYST ASPIRATION Left    BREAST CYST EXCISION Left    BREAST LUMPECTOMY     BREAST LUMPECTOMY WITH RADIOACTIVE SEED LOCALIZATION Left 09/08/2020   Procedure: LEFT BREAST LUMPECTOMY WITH RADIOACTIVE SEED LOCALIZATION;  Surgeon: Almond Lint, MD;  Location: [facility];  Service: General;  Laterality: Left;   BUNIONECTOMY  1990s   LEFT   CHOLECYSTECTOMY     HAND SURGERY  1990s   R hand x 2, L hand x 1   LUMBAR LAMINECTOMY/DECOMPRESSION MICRODISCECTOMY N/A 11/13/2012   Procedure: MICRO LUMBAR DECOMPRESSION L4 - L5 AND L2 - L3 2 LEVELS;  Surgeon: Javier Docker, MD;  Location: [facility];  Service: Orthopedics;  Laterality: N/A;   SHOULDER SURGERY  2006   left     Current Outpatient Medications  Medication Instructions   acetaminophen (TYLENOL) 500 mg, Oral, Every 6 hours PRN   ALPRAZolam (XANAX) 0.25-0.5 mg, Oral, At bedtime PRN   amLODipine (NORVASC) 10 mg, Oral, Daily   aspirin 81 MG chewable tablet Oral, Daily   calcium-vitamin D 250-100 MG-UNIT tablet 1 tablet, Oral, 2 times daily   escitalopram (LEXAPRO) 10  mg, Oral, Daily   famotidine (PEPCID) 20 MG tablet TAKE 1 TABLET(20 MG) BY MOUTH TWICE DAILY   fluticasone furoate-vilanterol (BREO ELLIPTA) 200-25 MCG/INH AEPB 1 puff, Inhalation, Daily   levothyroxine (SYNTHROID) 50 mcg, Oral, Daily before breakfast   simvastatin (ZOCOR) 20 MG tablet TAKE 1 TABLET(20 MG) BY MOUTH AT BEDTIME   tamoxifen (NOLVADEX) 20 mg, Oral, Daily       Objective:   Physical Exam BP 126/72   Pulse 76   Temp 98.2 F (36.8 C) (Oral)   Resp 16   Ht 5\' 1"  (1.549 m)   Wt 139 lb (63 kg)   SpO2 97%   BMI 26.26 kg/m  General:   Well developed, NAD, BMI noted. HEENT:  Normocephalic . Face symmetric, atraumatic Lungs:  CTA B Normal respiratory effort, no intercostal retractions, no accessory muscle  use. Heart: RRR,  no murmur.  Lower extremities: no pretibial edema bilaterally  Skin: Not pale. Not jaundice Neurologic:  alert & oriented X3.  Speech normal, gait appropriate for age and unassisted Psych--  Cognition and judgment appear intact.  Cooperative with normal attention span and concentration.  Behavior appropriate. No anxious or depressed appearing.      Assessment     Assessment  Prediabetes HTN Hyperlipidemia Anxiety depression, insomnia- xanax rx by pcp GERD CV: TIA? (2004) CAD: catheterization 2009 and 2016: Non-obstructive CAD, 40% blockages, see report. rx medical treatment Goiter: Last ultrasound 12-2014, nodules decrease in size MSK:  --Back - coccyxt pain, lumbar surgery 2014, chronic residual pain --on Tramadol (rx by back surgeon)   Vit d def Chronic Dizziness Eczema/psoriasis , sees derm Breast cancer 07/26/2020.  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  See OV note pulmonary 10/2020. Osteoporosis: T-score -2.6 (09/2022)  PLAN: Prediabetes: Check A1c.  Further advised with results. HTN: Seems well-controlled, continue amlodipine, last BMP okay. High cholesterol: On simvastatin, LFTs okay, check FLP.  Adjust medications if needed. Hypothyroidism: On Synthroid, check TSH.  Adjust medications if needed. History of breast cancer: Saw oncology 10/08/2022, on tamoxifen. Osteoporosis: Bone density test 09/2022, working w/ oncology ref  treatment. ILD: Has chronic cough, she actually went to the beach and took long walks without DOE.  To have a CT soon by pulmonary. Gait: Reports gait starting to feel wobbly, offered P.T. but elected to start going to the gym to get some of her lower extremity muscle strength back. RTC 4 months CPX

## 2022-12-07 NOTE — Patient Instructions (Addendum)
Vaccines I recommend: Tdap (tetanus) RSV vaccine  Check the  blood pressure regularly BP GOAL is between 110/65 and  135/85. If it is consistently higher or lower, let me know    GO TO THE LAB : Get the blood work     GO TO THE FRONT DESK, PLEASE SCHEDULE YOUR APPOINTMENTS Come back for physical exam in 4 months

## 2022-12-08 NOTE — Assessment & Plan Note (Addendum)
Prediabetes: Check A1c.  Further advised with results. HTN: Seems well-controlled, continue amlodipine, last BMP okay. High cholesterol: On simvastatin, LFTs okay, check FLP.  Adjust medications if needed. Hypothyroidism: On Synthroid, check TSH.  Adjust medications if needed. History of breast cancer: Saw oncology 10/08/2022, on tamoxifen. Osteoporosis: Bone density test 09/2022, working w/ oncology ref  treatment. ILD: Has chronic cough, she actually went to the beach and took long walks without DOE.  To have a CT soon by pulmonary. Gait: Reports gait starting to feel wobbly, offered P.T. but elected to start going to the gym to get some of her lower extremity muscle strength back. RTC 4 months CPX

## 2022-12-11 MED ORDER — ATORVASTATIN CALCIUM 20 MG PO TABS: 20.0000 mg | ORAL_TABLET | Freq: Every day | ORAL | 1 refills | Status: DC

## 2022-12-11 NOTE — Addendum Note (Signed)
Addended byConrad [place] D on: 12/11/2022 12:45 PM   Modules accepted: Orders

## 2022-12-12 IMAGING — DX DG CHEST 2V
2 series · 2 of 2 positions shown · non-contrast
Comparison: 06/13/2020, 10/30/2012

CLINICAL DATA: Pneumonia

EXAM:
CHEST - 2 VIEW

[chest pa]
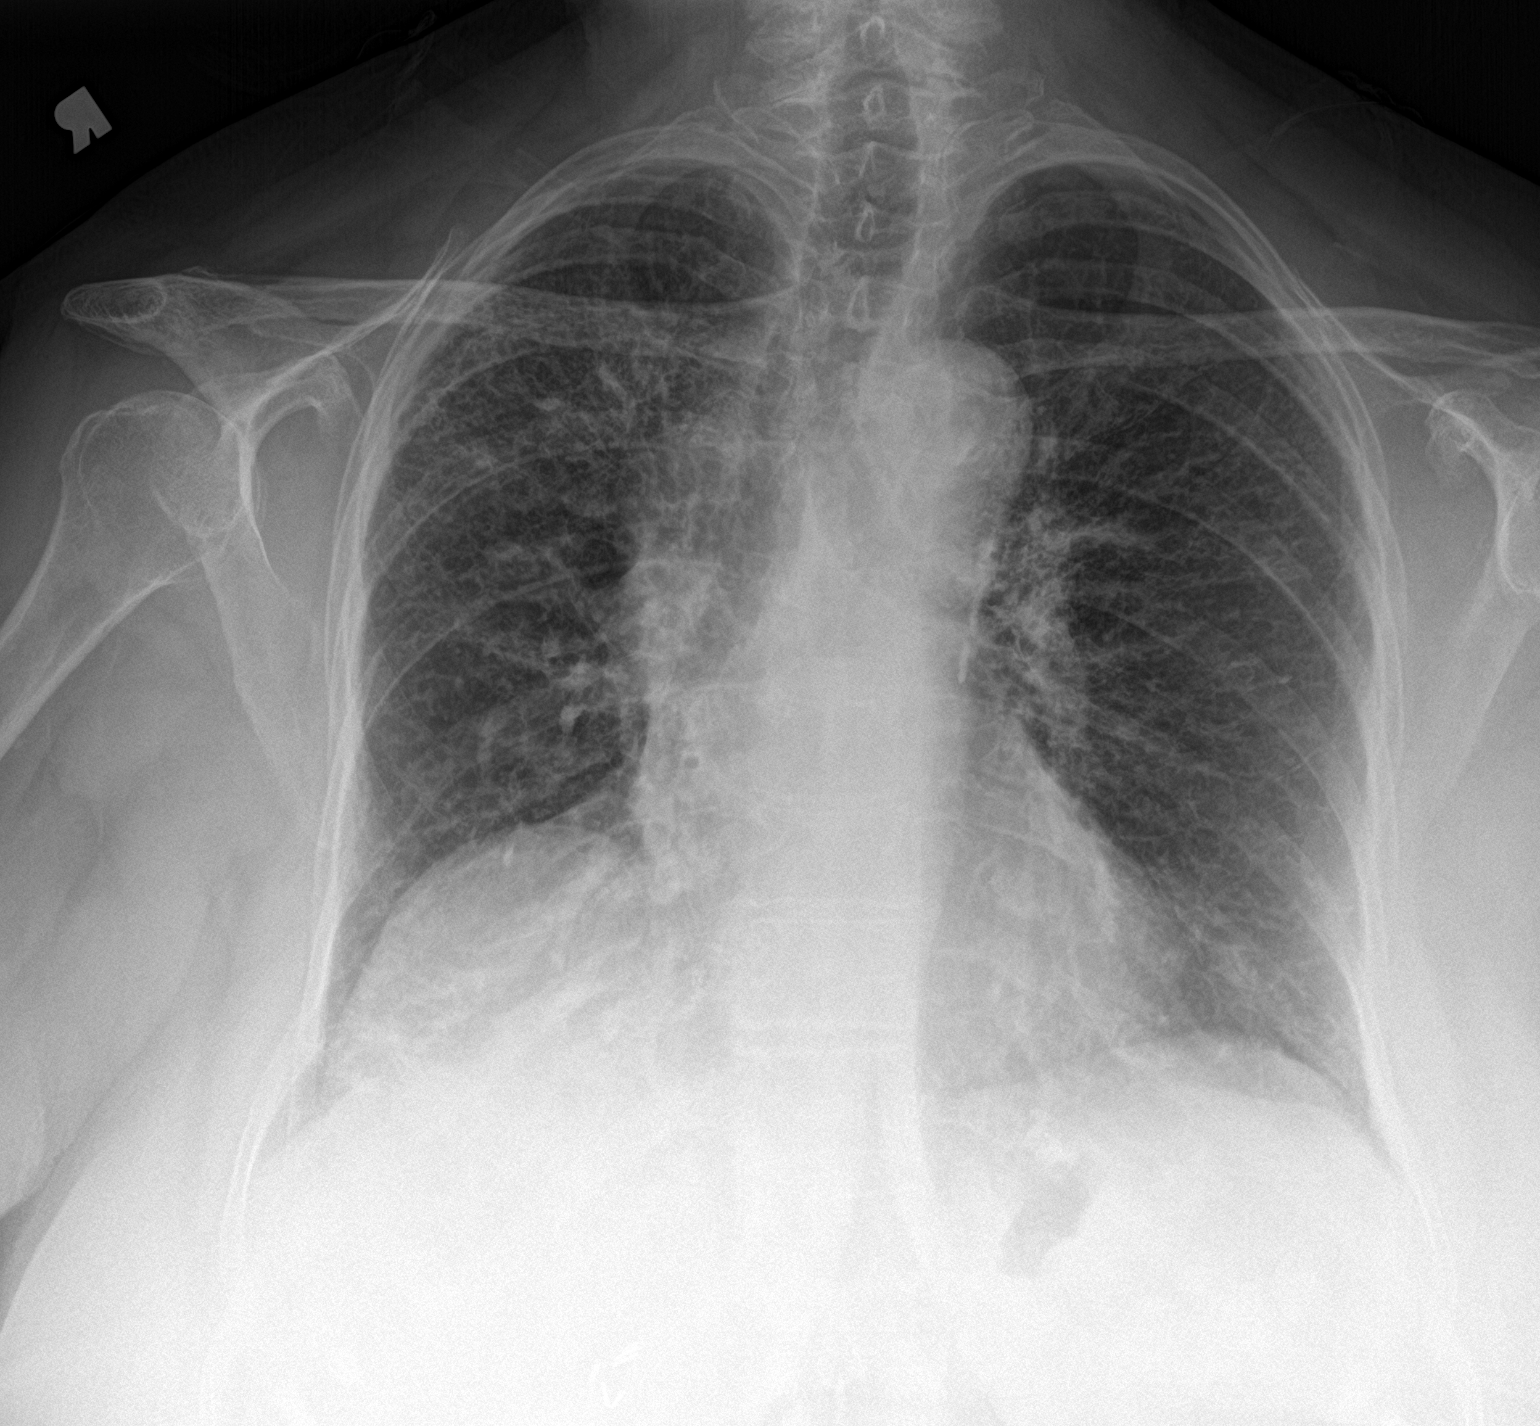

[chest lat]
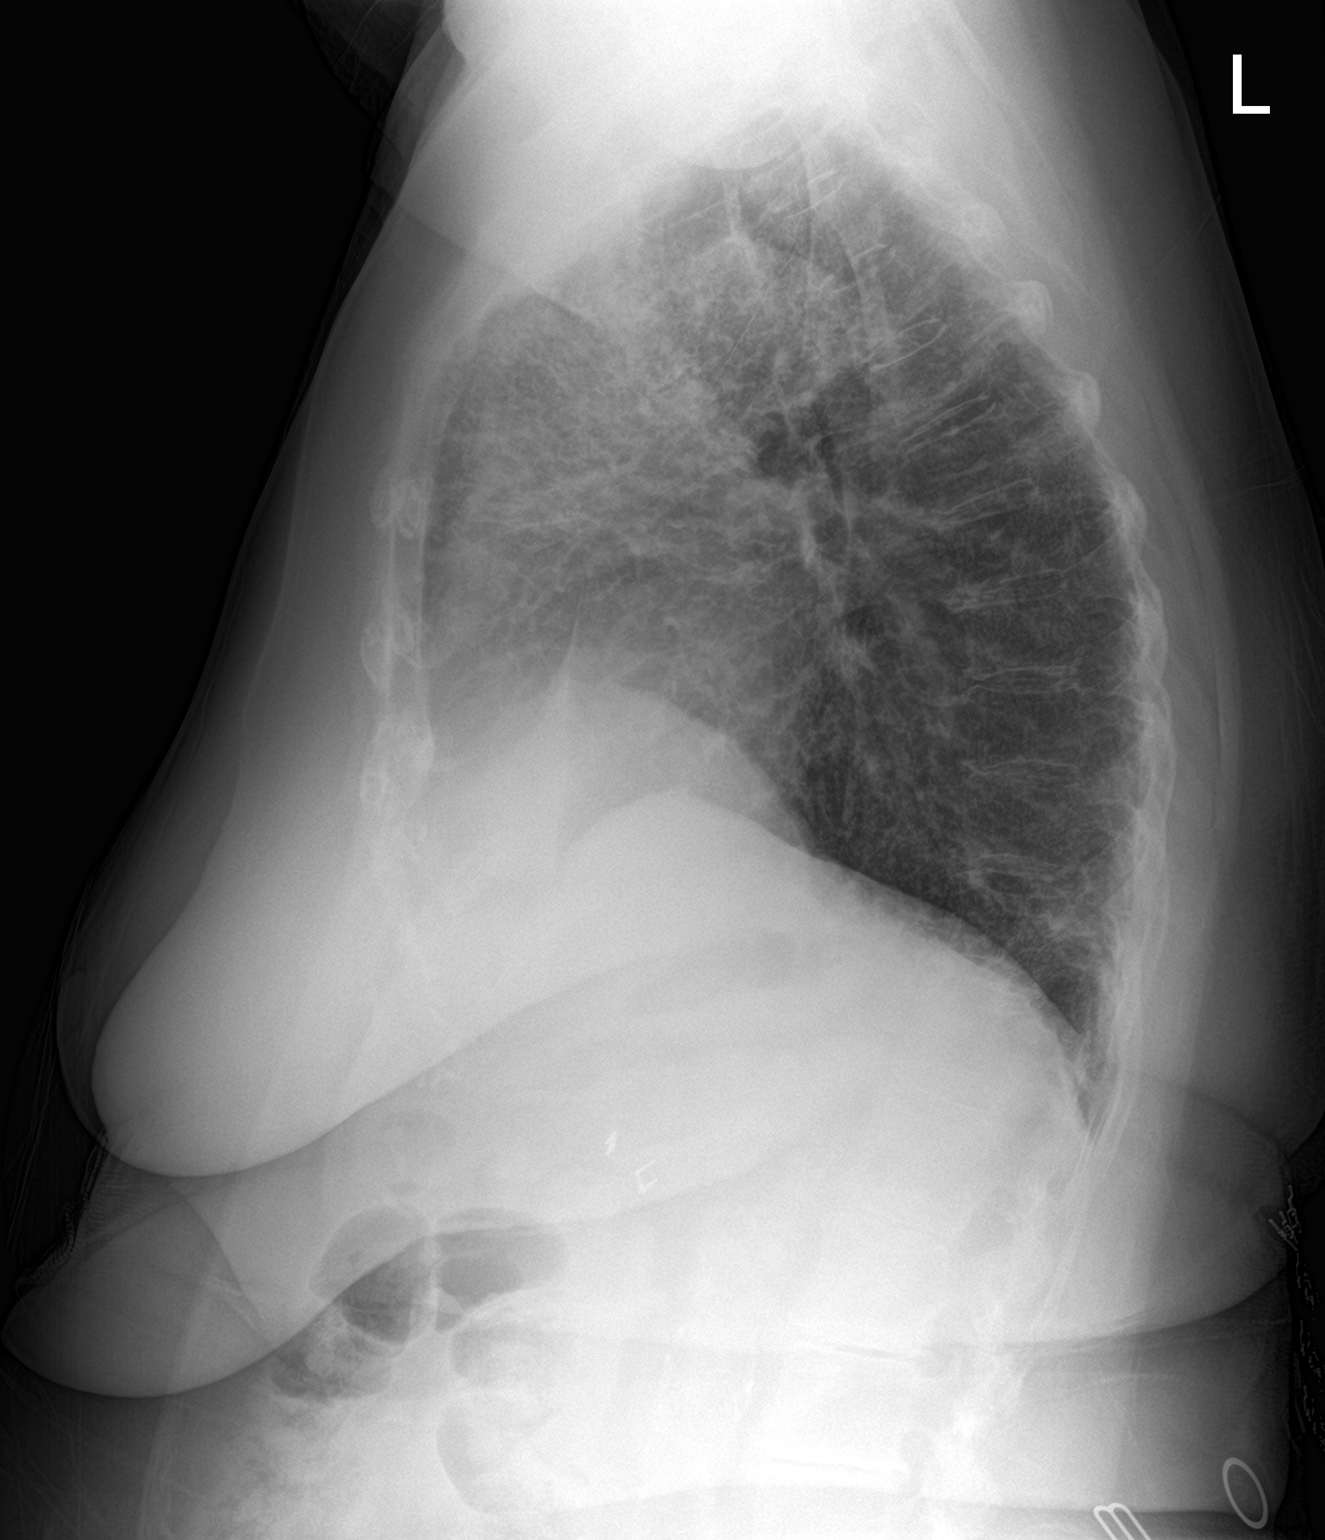

[2 of 2 positions shown; findings below may reference images not displayed]

FINDINGS: Coarse reticular opacity in the right upper lobe persists. No focal
consolidation or pleural effusion. Stable cardiomediastinal
silhouette with aortic atherosclerosis. No pneumothorax.
IMPRESSION: Reticular opacity in the right upper lobe persists and is unchanged
since most recent prior but new compared to 1053. Unclear if this is
due to unresolved infectious or inflammatory process versus chronic
interstitial lung disease and fibrosis. Consider chest CT for
further evaluation.

## 2022-12-17 IMAGING — MG MM BREAST LOCALIZATION CLIP
4 series · 4 of 12 positions shown · non-contrast
Comparison: Previous exam(s).

CLINICAL DATA: Status post ultrasound guided core needle biopsy
left breast mass 1 o'clock position.

EXAM:
DIAGNOSTIC LEFT MAMMOGRAM POST ULTRASOUND BIOPSY

[L ML synth-2D]
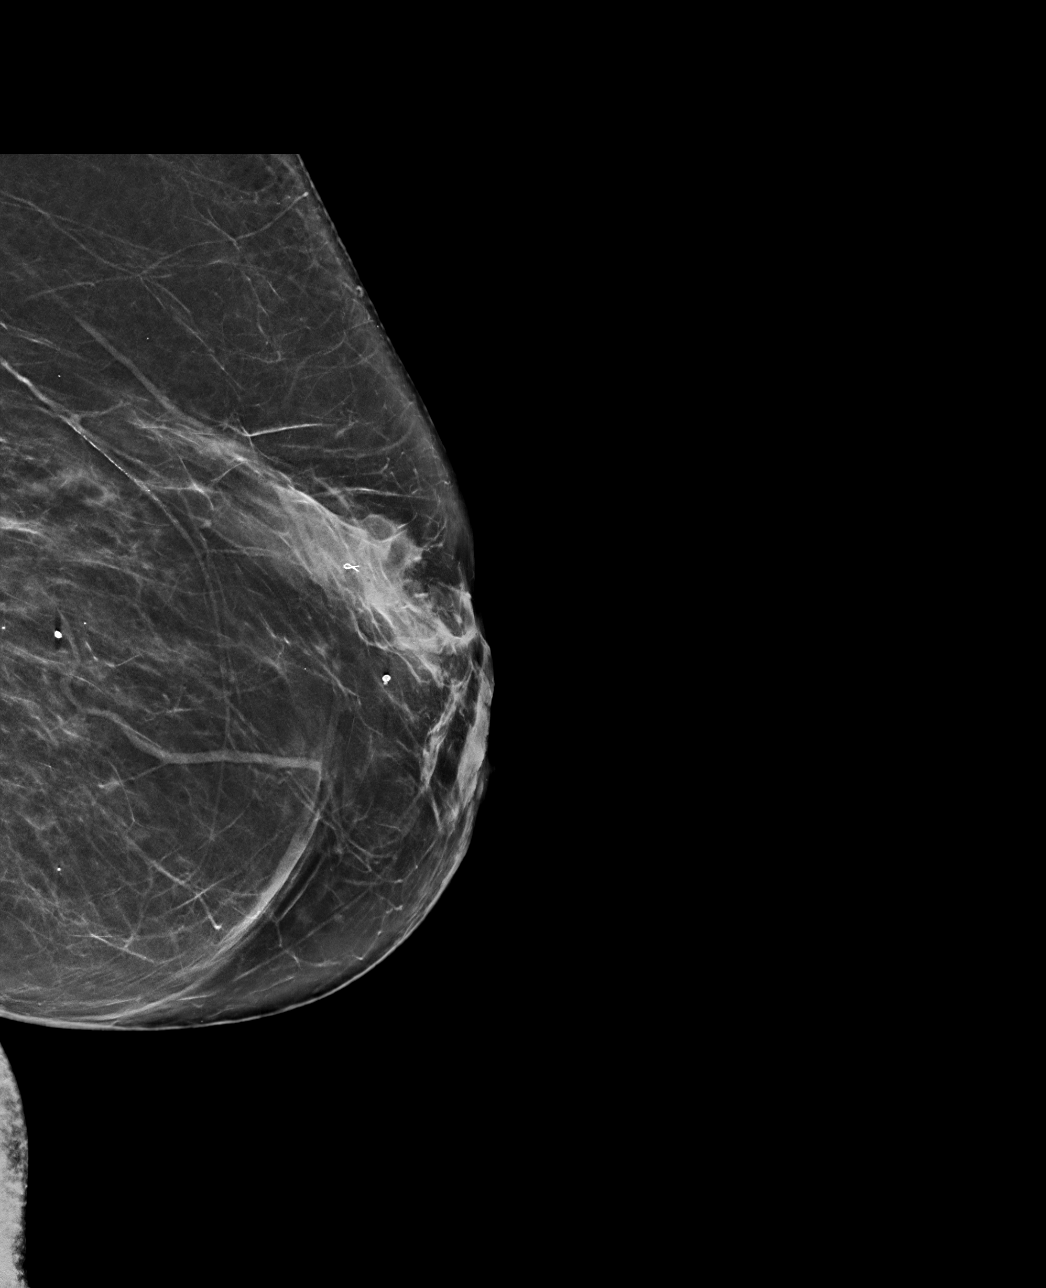

[L CC synth-2D]
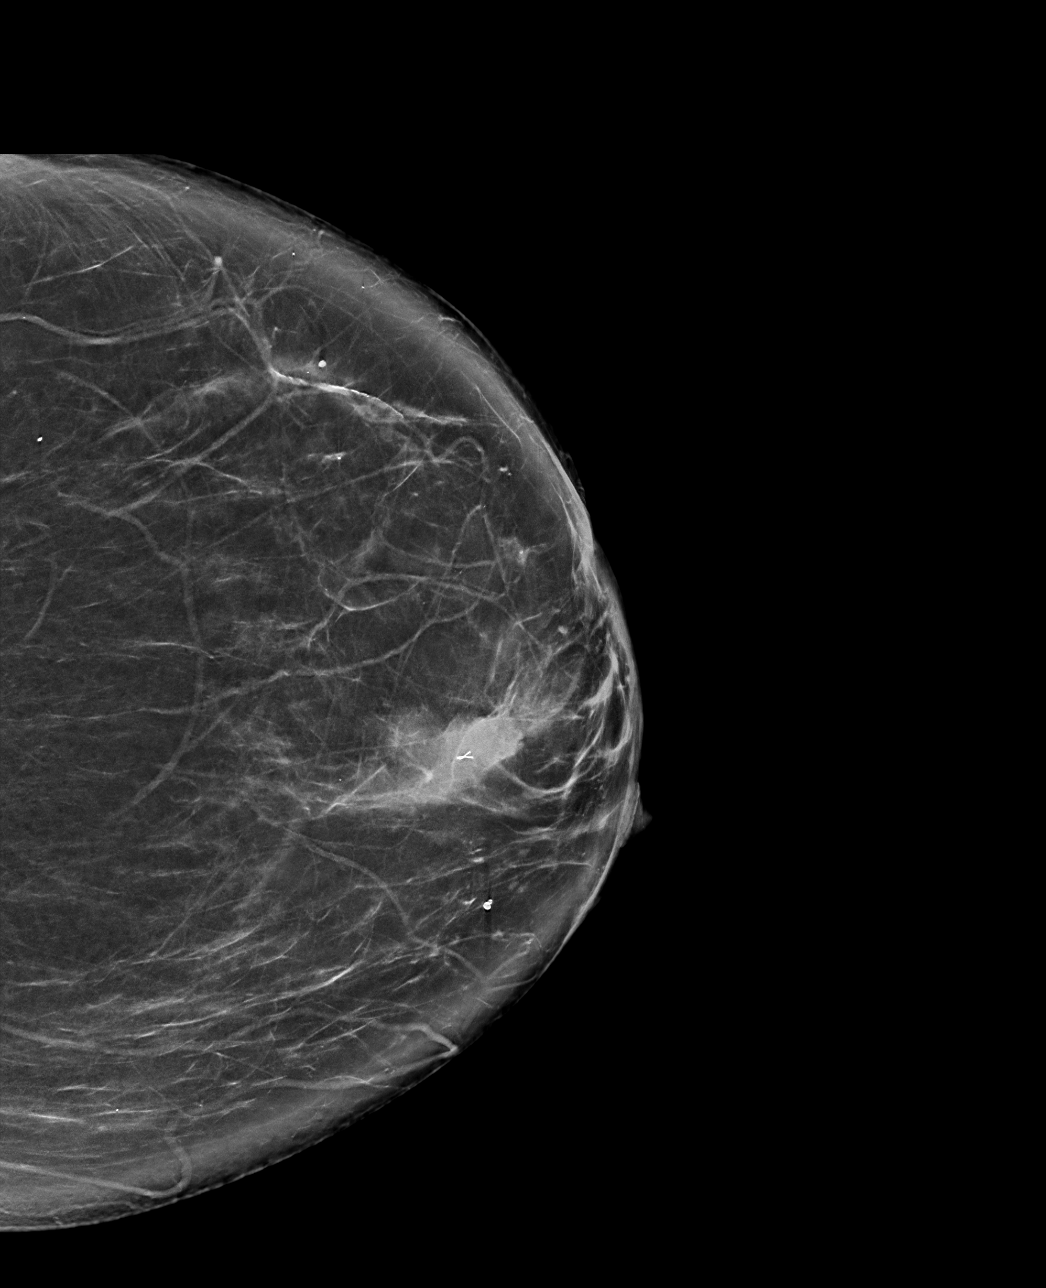

[L CC tomo · tomo slice 41/82.0]
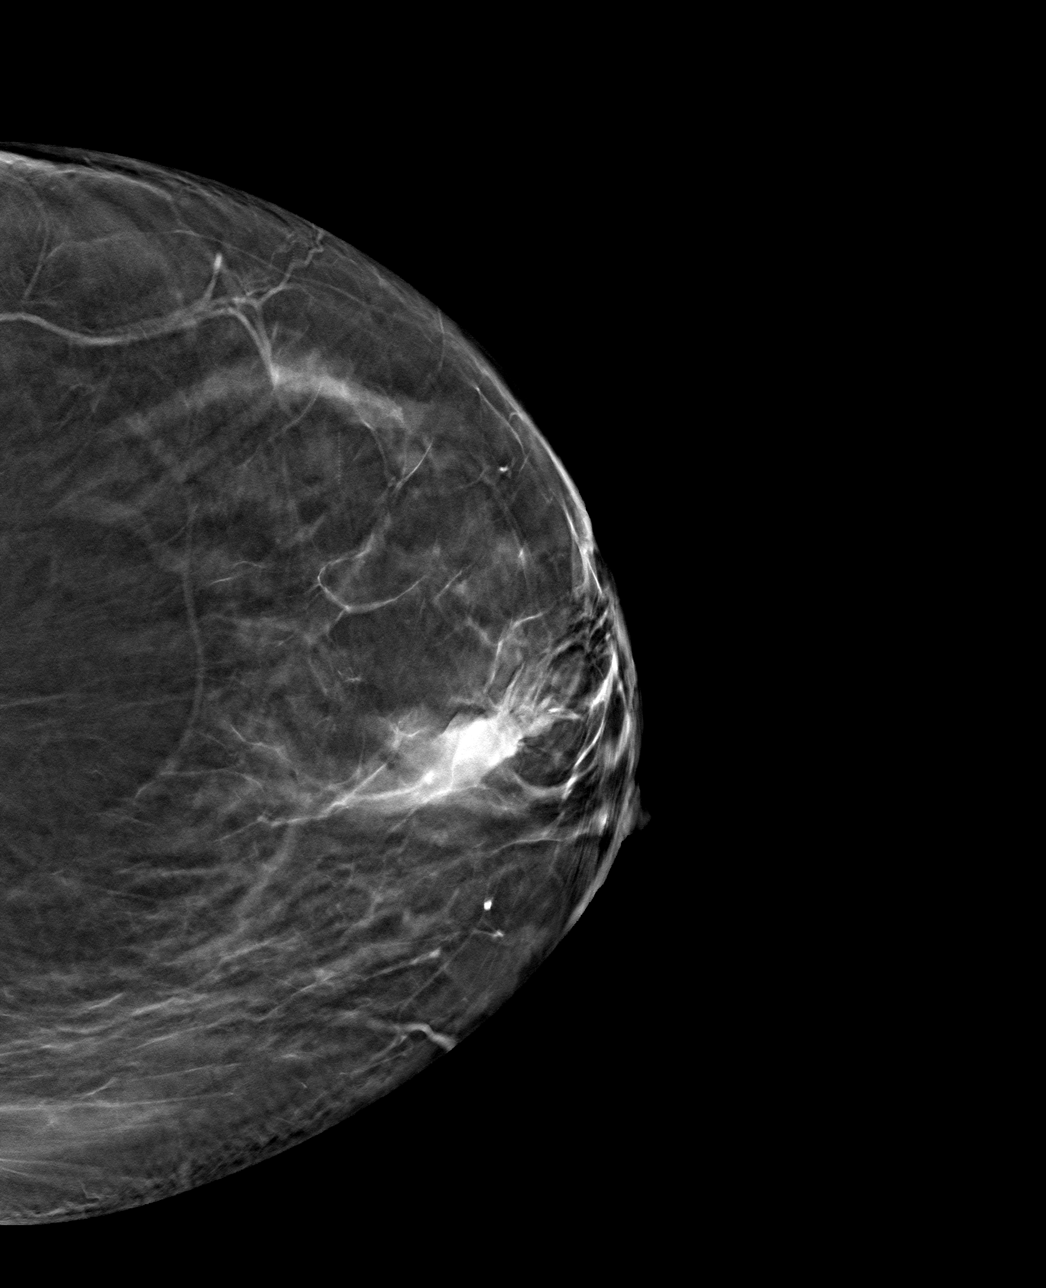

[L ML tomo · tomo slice 37/74.0]
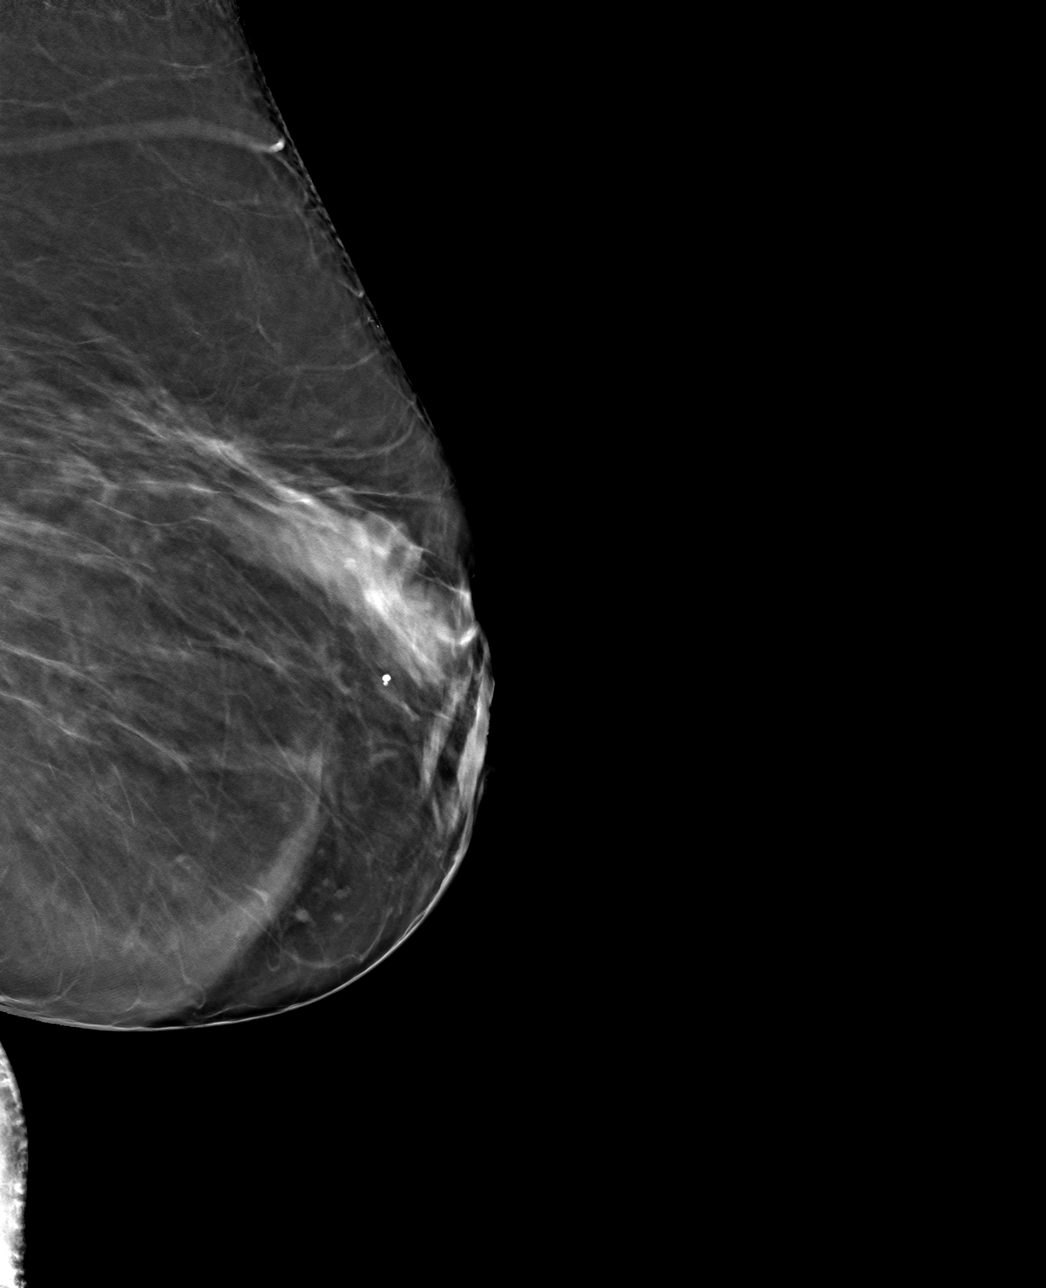

[4 of 12 positions shown; findings below may reference images not displayed]

FINDINGS: Mammographic images were obtained following ultrasound guided biopsy
of left breast mass 1 o'clock position. The biopsy marking clip is
in expected position at the site of biopsy.
IMPRESSION: Appropriate positioning of the ribbon shaped biopsy marking clip at
the site of biopsy in the left breast 1 o'clock position.

Final Assessment: Post Procedure Mammograms for Marker Placement

## 2022-12-19 ENCOUNTER — Encounter: Payer: Self-pay | Admitting: Gastroenterology

## 2023-01-10 ENCOUNTER — Other Ambulatory Visit: Payer: Self-pay | Admitting: Internal Medicine

## 2023-01-13 ENCOUNTER — Other Ambulatory Visit: Payer: Self-pay | Admitting: Internal Medicine

## 2023-01-22 ENCOUNTER — Telehealth: Payer: Self-pay | Admitting: Pulmonary Disease

## 2023-01-22 MED ORDER — FLUTICASONE FUROATE-VILANTEROL 200-25 MCG/ACT IN AEPB: 1.0000 | INHALATION_SPRAY | Freq: Every day | RESPIRATORY_TRACT | 11 refills | Status: DC

## 2023-01-22 NOTE — Telephone Encounter (Signed)
Breo generic sent to local pharmacy.  I cannot quite figure out what the messages saying, I assume that maybe her insurance formulary has changed?  I have not discussed any alternatives in the past.  However, I will also route to our prior Auth team to see if there is a more cost effective ICS/LABA therapy.

## 2023-01-22 NOTE — Telephone Encounter (Signed)
Pt. Spoke with Dr. Judeth Horn about f/u after ct and she nned a alternative to her fluticasone furoate-vilanterol (BREO ELLIPTA) 200-25 MCG/INH AEPB  and told her that there was a grneric that they could call in for her I made f/u  with [facility] for ct nothing available in [facility] sched. Please advise pt.

## 2023-01-25 NOTE — Telephone Encounter (Signed)
Called the pt and there was no answer and no VM picked up  Will await response from pharm team  Thanks!

## 2023-01-25 NOTE — Telephone Encounter (Signed)
Called back pt. Made her aware that Prior Auth Team is working to find a alternative inhaler for her

## 2023-01-28 ENCOUNTER — Ambulatory Visit: Admission: RE | Admit: 2023-01-28 | Payer: Medicare Other | Source: Ambulatory Visit

## 2023-01-28 ENCOUNTER — Other Ambulatory Visit (HOSPITAL_COMMUNITY): Payer: Self-pay

## 2023-01-28 DIAGNOSIS — I2510 Atherosclerotic heart disease of native coronary artery without angina pectoris: Secondary | ICD-10-CM | POA: Diagnosis not present

## 2023-01-28 DIAGNOSIS — I700 Atherosclerosis of aorta: Secondary | ICD-10-CM | POA: Diagnosis not present

## 2023-01-28 DIAGNOSIS — J849 Interstitial pulmonary disease, unspecified: Secondary | ICD-10-CM

## 2023-01-28 DIAGNOSIS — Z8616 Personal history of COVID-19: Secondary | ICD-10-CM | POA: Diagnosis not present

## 2023-01-28 DIAGNOSIS — R053 Chronic cough: Secondary | ICD-10-CM | POA: Diagnosis not present

## 2023-01-28 NOTE — Telephone Encounter (Signed)
Test claim results in the following for Breo and others in the same class:   Breo Ellipta  -   $47.00 Advair HFA  -  generic not covered   brand $47.00 Advair Diskus  -  generic $47.00    brand $47.00 Wixela - $47.00 Dulera  - $100.00 Symbicort  - generic not covered    brand $47.00

## 2023-01-31 NOTE — Telephone Encounter (Signed)
Can we f/u on next steps for pt.

## 2023-02-04 ENCOUNTER — Other Ambulatory Visit: Payer: Self-pay | Admitting: Internal Medicine

## 2023-02-05 NOTE — Telephone Encounter (Signed)
I sent generic Earlie Server several days ago - this is one of the cheapest options at $47. I thought this was resolved at the time of the prescription I sent in previously.

## 2023-02-07 NOTE — Telephone Encounter (Signed)
Call pt. Back left message about cost of inhaler and if she had any further questions to please call us back

## 2023-02-07 NOTE — Telephone Encounter (Signed)
Called and spoke with patient.  Patient received new inhaler this week and stated she had no issues.  Nothing further at this time.

## 2023-02-19 ENCOUNTER — Ambulatory Visit: Payer: Medicare Other | Admitting: Acute Care

## 2023-02-19 ENCOUNTER — Encounter: Payer: Self-pay | Admitting: Acute Care

## 2023-02-19 VITALS — BP 120/70 | HR 75 | Ht 61.0 in | Wt 137.6 lb

## 2023-02-19 DIAGNOSIS — R0600 Dyspnea, unspecified: Secondary | ICD-10-CM | POA: Diagnosis not present

## 2023-02-19 DIAGNOSIS — I2510 Atherosclerotic heart disease of native coronary artery without angina pectoris: Secondary | ICD-10-CM

## 2023-02-19 DIAGNOSIS — J849 Interstitial pulmonary disease, unspecified: Secondary | ICD-10-CM

## 2023-02-19 MED ORDER — BREO ELLIPTA 100-25 MCG/ACT IN AEPB: 1.0000 | INHALATION_SPRAY | Freq: Every day | RESPIRATORY_TRACT | 6 refills | Status: DC

## 2023-02-19 NOTE — Progress Notes (Signed)
History of Present Illness [name] is a 77 y.o. 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  She is followed by Dr. Judeth Horn.   02/19/2023 Pt. Presents for follow up to review HRCT. Her CT Chest is stable. Shows her lungs remain similar to prior exams, most compatible with chronic hypersensitivity pneumonitis. She states she is doing well. No shortness of breath, or issues. She does have a cough that is productive for clear secretions. She states this is usually late afternoon, early morning. It is minimal. She is reassured by her stable scan.  We have discussed the cardiology findings in addition to her pulmonary findings. There is notation of aortic atherosclerosis, as well as atherosclerosis of the great vessels of the mediastinum and the coronary arteries, including calcified atherosclerotic plaque in the left main, left anterior descending, left circumflex and right coronary arteries. Mild calcifications of the aortic valve. She has never been seen by cardiology. I will refer her today to be seen. She understands if she develops chest pain that she should seek emergency care.   Test Results: HRCT 01/28/2023 The appearance of the lungs is very similar to the prior examination, once again considered most compatible with an alternative diagnosis (not usual interstitial pneumonia) per current ATS guidelines. Findings are once again favored to reflect chronic hypersensitivity pneumonitis. 2. Aortic atherosclerosis, in addition to left main and three-vessel coronary artery disease. Please note that although the presence of coronary artery calcium documents the presence of coronary artery disease, the severity of this disease and any potential stenosis cannot be assessed on this non-gated CT examination. Assessment for potential risk factor modification, dietary therapy or pharmacologic therapy may be  warranted, if clinically indicated. 3. There are mild calcifications of the aortic valve. Echocardiographic correlation for evaluation of potential valvular dysfunction may be warranted if clinically indicated.     Latest Ref Rng & Units 10/08/2022    1:04 PM 04/02/2022   11:25 AM 09/28/2021   10:11 AM  CBC  WBC 4.0 - 10.5 K/uL 7.2  7.8  8.0   Hemoglobin 12.0 - 15.0 g/dL 16.1  09.6  04.5   Hematocrit 36.0 - 46.0 % 40.5  40.3  42.3   Platelets 150 - 400 K/uL 116  112  103        Latest Ref Rng & Units 10/08/2022    1:04 PM 04/02/2022   11:25 AM 09/28/2021   10:11 AM  BMP  Glucose 70 - 99 mg/dL 409  811  914   BUN 8 - 23 mg/dL 15  11  14    Creatinine 0.44 - 1.00 mg/dL 7.82  9.56  2.13   Sodium 135 - 145 mmol/L 137  138  140   Potassium 3.5 - 5.1 mmol/L 3.8  3.9  3.7   Chloride 98 - 111 mmol/L 104  106  108   CO2 22 - 32 mmol/L 26  28  25    Calcium 8.9 - 10.3 mg/dL 9.0  9.4  9.2     BNP No results found for: "BNP"  ProBNP No results found for: "PROBNP"  PFT No results found for: "FEV1PRE", "FEV1POST", "FVCPRE", "FVCPOST", "TLC", "DLCOUNC", "PREFEV1FVCRT", "PSTFEV1FVCRT"  CT Chest High Resolution  Result Date: 02/02/2023 CLINICAL DATA:  77 year old female with history of chronic cough since prior COVID infection. Evaluate for pulmonary fibrosis. EXAM: CT CHEST WITHOUT CONTRAST TECHNIQUE: Multidetector CT imaging of the chest was performed  following the standard protocol without intravenous contrast. High resolution imaging of the lungs, as well as inspiratory and expiratory imaging, was performed. RADIATION DOSE REDUCTION: This exam was performed according to the departmental dose-optimization program which includes automated exposure control, adjustment of the mA and/or kV according to patient size and/or use of iterative reconstruction technique. COMPARISON:  High-resolution chest CT 01/20/2021. FINDINGS: Cardiovascular: Heart size is normal. There is no significant pericardial  fluid, thickening or pericardial calcification. There is aortic atherosclerosis, as well as atherosclerosis of the great vessels of the mediastinum and the coronary arteries, including calcified atherosclerotic plaque in the left main, left anterior descending, left circumflex and right coronary arteries. Mild calcifications of the aortic valve. Mediastinum/Nodes: No pathologically enlarged mediastinal or hilar lymph nodes. Please note that accurate exclusion of hilar adenopathy is limited on noncontrast CT scans. Esophagus is unremarkable in appearance. No axillary lymphadenopathy. Lungs/Pleura: High-resolution images again demonstrate some patchy areas of ground-glass attenuation, extensive septal thickening, subpleural reticulation, scattered cylindrical and varicose bronchiectasis, peripheral bronchiolectasis and a few areas of mild honeycombing. These findings have no discernible craniocaudal gradient, and the most advanced fibrotic changes are again noted throughout the mid to upper lungs bilaterally, similar to the prior study. Inspiratory and expiratory imaging demonstrates scattered areas of air trapping indicative of small airways disease, but evaluation is limited by poor patient cooperation. Overall, imaging findings are very similar to the prior examination without substantial progression of disease. No acute consolidative airspace disease. No pleural effusions. No suspicious appearing pulmonary nodules or masses are noted. Upper Abdomen: Aortic atherosclerosis.  Status post cholecystectomy. Musculoskeletal: Postoperative changes of prior lumpectomy are noted in the left breast. There are no aggressive appearing lytic or blastic lesions noted in the visualized portions of the skeleton. IMPRESSION: 1. The appearance of the lungs is very similar to the prior examination, once again considered most compatible with an alternative diagnosis (not usual interstitial pneumonia) per current ATS guidelines.  Findings are once again favored to reflect chronic hypersensitivity pneumonitis. 2. Aortic atherosclerosis, in addition to left main and three-vessel coronary artery disease. Please note that although the presence of coronary artery calcium documents the presence of coronary artery disease, the severity of this disease and any potential stenosis cannot be assessed on this non-gated CT examination. Assessment for potential risk factor modification, dietary therapy or pharmacologic therapy may be warranted, if clinically indicated. 3. There are mild calcifications of the aortic valve. Echocardiographic correlation for evaluation of potential valvular dysfunction may be warranted if clinically indicated. Aortic Atherosclerosis (ICD10-I70.0). Electronically Signed   By: Trudie Reed M.D.   On: 02/02/2023 12:15     Past medical hx Past Medical History:  Diagnosis Date   Anxiety and depression    Arthritis    Blood transfusion without reported diagnosis 1989   GB hemorrage    Cataract    Bil/no surgery yet.   Coccyx pain    Secondary to scar tissue from old pressure ulcer after back surgery, Dr. Luisa Hart   Coronary artery disease    Depression 2010   panic attacks and depression   Dizziness    severe: MRI chronic isch. changes and mastoiditis- saw Dr.Mundy(2007)  STATES SHE STILL HAS EPISODES- ESPECIALLY IF SHE GETS UP TOO QUICKLY AFTER LYING DOWN   Eczema    Family history of breast cancer    Family history of kidney cancer    Family history of lung cancer    Family history of melanoma    Family history of  stomach cancer    Family history of throat cancer    GERD (gastroesophageal reflux disease)    Goiter    Headache(784.0)    Hyperlipidemia    Hypertension    Pain    PAIN LOWER BACK AND DOWN RIGHT LEG WITH NUMBNESS, TINGLING RT LEG AND FOOT--STENOSIS   Partial tear of right rotator cuff    & labral tear   S/P cardiac cath 2009   Revealing nonobstructive CAD w/ tubular, discrete  40% mid lesion in the circumflex; discrete 30% proximal lesion in the LAD; luminal irregularities, 35% mid lesion in RCA; and generic 40% mid lesion in the RCA. Medical treatment recommended.   S/P cardiac cath 11/24/2014   Revealing nonobstructive CAD w/ tubular, discrete 40% mid lesion in the circumflex; discrete 30% proximal lesion in the LAD; luminal irregularities, 35% mid lesion in RCA; and generic 40% mid lesion in the RCA. Medical treatment recommended.   Stroke [facility]) 2004   mild TIA     Social History   Tobacco Use   Smoking status: Former    Current packs/day: 0.00    Average packs/day: 1 pack/day for 20.0 years (20.0 ttl pk-yrs)    Types: Cigarettes    Start date: 81    Quit date: 2000    Years since quitting: 24.5   Smokeless tobacco: Never   Tobacco comments:    quit aprox 2000 after 30 years, 1 to 1.5 ppd  Substance Use Topics   Alcohol use: No   Drug use: No    [name] reports that she quit smoking about 24 years ago. Her smoking use included cigarettes. She started smoking about 44 years ago. She has a 20 pack-year smoking history. She has never used smokeless tobacco. She reports that she does not drink alcohol and does not use drugs.  Tobacco Cessation: Counseling given: Not Answered Tobacco comments: quit aprox 2000 after 30 years, 1 to 1.5 ppd   Past surgical hx, Family hx, Social hx all reviewed.  Current Outpatient Medications on File Prior to Visit  Medication Sig   acetaminophen (TYLENOL) 500 MG tablet Take 500 mg by mouth every 6 (six) hours as needed.   ALPRAZolam (XANAX) 0.25 MG tablet Take 1-2 tablets (0.25-0.5 mg total) by mouth at bedtime as needed for anxiety or sleep.   amLODipine (NORVASC) 10 MG tablet Take 1 tablet (10 mg total) by mouth daily.   aspirin 81 MG chewable tablet Chew by mouth daily.   atorvastatin (LIPITOR) 20 MG tablet Take 1 tablet (20 mg total) by mouth at bedtime.   calcium-vitamin D 250-100 MG-UNIT tablet Take 1  tablet by mouth 2 (two) times daily.   escitalopram (LEXAPRO) 20 MG tablet Take 0.5 tablets (10 mg total) by mouth daily.   famotidine (PEPCID) 20 MG tablet TAKE 1 TABLET(20 MG) BY MOUTH TWICE DAILY   fluticasone furoate-vilanterol (BREO ELLIPTA) 200-25 MCG/ACT AEPB Inhale 1 puff into the lungs daily.   levothyroxine (SYNTHROID) 50 MCG tablet Take 1 tablet (50 mcg total) by mouth daily before breakfast.   tamoxifen (NOLVADEX) 20 MG tablet Take 1 tablet (20 mg total) by mouth daily.   No current facility-administered medications on file prior to visit.     Allergies  Allergen Reactions   Codeine Other (Copeland Comments)      chest and stomach pain, severe abd cramping   Hydrocodone     Whelts all over/swelling in lips   Sertraline Hcl Other (Copeland Comments)     muscle aches,  diarrhea, nausea    Review Of Systems:  Constitutional:   No  weight loss, night sweats,  Fevers, chills, fatigue, or  lassitude.  HEENT:   No headaches,  Difficulty swallowing,  Tooth/dental problems, or  Sore throat,                No sneezing, itching, ear ache, nasal congestion, post nasal drip,   CV:  No chest pain,  Orthopnea, PND, swelling in lower extremities, anasarca, dizziness, palpitations, syncope.   GI  No heartburn, indigestion, abdominal pain, nausea, vomiting, diarrhea, change in bowel habits, loss of appetite, bloody stools.   Resp: No shortness of breath with exertion or at rest.  No excess mucus, no productive cough,  No non-productive cough,  No coughing up of blood.  No change in color of mucus.  No wheezing.  No chest wall deformity  Skin: no rash or lesions.  GU: no dysuria, change in color of urine, no urgency or frequency.  No flank pain, no hematuria   MS:  No joint pain or swelling.  No decreased range of motion.  No back pain.  Psych:  No change in mood or affect. No depression or anxiety.  No memory loss.   Vital Signs BP 120/70 (BP Location: Left Arm)   Pulse 75   Ht 5\' 1"   (1.549 m)   Wt 137 lb 9.6 oz (62.4 kg)   SpO2 96%   BMI 26.00 kg/m    Physical Exam:  General- No distress,  A&Ox3, pleasant ENT: No sinus tenderness, TM clear, pale nasal mucosa, no oral exudate,no post nasal drip, no LAN Cardiac: S1, S2, regular rate and rhythm, no murmur Chest: No wheeze/ rales/ dullness; no accessory muscle use, no nasal flaring, no sternal retractions, crackles per bases most likely fibrosis related. Abd.: Soft Non-tender, NS, BS +, Body mass index is 26 kg/m.  Ext: No clubbing cyanosis, edema Neuro:  normal strength, MAE x 4, A&O x 3 Skin: No rashes, warm and dry, No lesions  Psych: normal mood and behavior   Assessment/Plan Stable chronic hypersensitivity pneumonitis per imaging CAD on imaging Plan The CT Scan shows the area of scarring in your lung is stable, and has not progressed. We will do a 6 month follow up scan , then follow up with Dr. Judeth Horn after to review results. As we discussed I have referred you to Cardiology to evaluate the findings of CAD.  You will get a call to schedule this.  Follow up in 6 months with Dr. Judeth Horn after follow up CT Chest ( Please schedule patient for 6 moth follow up.  Call if you need [place] sooner.  I have ordered Breo 100 mg inhalers for you.  Please contact office for sooner follow up if symptoms do not improve or worsen or seek emergency care    I spent 25 minutes dedicated to the care of this patient on the date of this encounter to include pre-visit review of records, face-to-face time with the patient discussing conditions above, post visit ordering of testing, clinical documentation with the electronic health record, making appropriate referrals as documented, and communicating necessary information to the patient's healthcare team.    Bevelyn Ngo, NP 02/19/2023  10:07 AM

## 2023-02-19 NOTE — Patient Instructions (Addendum)
The CT Scan shows the area of scarring in your lung is stable, and has not progressed. We will do a 6 month follow up scan , then follow up with Dr. Judeth Horn after to review results. As we discussed I have referred you to Cardiology to evaluate the findings of CAD.  You will get a call to schedule this.  Follow up in 6 months with Dr. Judeth Horn after follow up CT Chest ( Please schedule patient for 6 moth follow up.  Call if you need [place] sooner.  I have ordered Breo 100 mg inhalers for you.  Please contact office for sooner follow up if symptoms do not improve or worsen or seek emergency care

## 2023-02-21 ENCOUNTER — Telehealth: Payer: Self-pay | Admitting: Internal Medicine

## 2023-02-21 NOTE — Telephone Encounter (Signed)
Prescription Request  02/21/2023  Is this a "Controlled Substance" medicine? Yes  LOV: 12/07/2022  What is the name of the medication or equipment? ALPRAZolam (XANAX) 0.25 MG tablet   Have you contacted your pharmacy to request a refill? No   Which pharmacy would you like this sent to?  [id number] [place], [place] - [address] AT [facility] [address] [place] [address] Phone: [phone] Fax: [phone]    Patient notified that their request is being sent to the clinical staff for review and that they should receive a response within 2 business days.   Please advise at Mobile [phone] (mobile)

## 2023-02-22 ENCOUNTER — Other Ambulatory Visit: Payer: Self-pay | Admitting: Internal Medicine

## 2023-02-28 ENCOUNTER — Encounter: Payer: Self-pay | Admitting: Cardiology

## 2023-02-28 DIAGNOSIS — I2510 Atherosclerotic heart disease of native coronary artery without angina pectoris: Secondary | ICD-10-CM | POA: Insufficient documentation

## 2023-02-28 NOTE — Progress Notes (Signed)
Cardiology Office Note   Date:  03/01/2023   ID:  Uraina, Mankin Oct 08, 1945, MRN [id number]  PCP:  Wanda Plump, MD  Cardiologist:   None Referring:  Wanda Plump, MD  Chief Complaint  Patient presents with   Shortness of Breath      History of Present Illness: [name] is a 77 y.o. female who is referred for evaluation of aortic atherosclerosis and SOB .    She had a perfusion study in 2014 that was negative for ischemia.    She had non obstructive CAD with cath in 2016.    She had COVID a couple of times and following this started developing shortness of breath.  She has had some CTs to evaluate this with evidence aortic atherosclerosis and coronary atherosclerosis.  She does have shortness of breath walking less than 50 yards on level ground.  She gets short of breath vacuuming.  She has to stop what she is doing and recover for a few minutes.  She is not describing PND or orthopnea.  She is not describing palpitations, presyncope or syncope.  She does have a cough productive of some sputum but she is not describing having to sleep in a chair.  She has had no new extremity swelling.  She has had no chest pressure but gets occasional shooting chest pains.  She has had no neck or arm pain.   Past Medical History:  Diagnosis Date   Anxiety and depression    Arthritis    Blood transfusion without reported diagnosis 1989   GB hemorrage    Cataract    Bil/no surgery yet.   Coccyx pain    Secondary to scar tissue from old pressure ulcer after back surgery, Dr. Luisa Hart   Coronary artery disease    Depression 2010   panic attacks and depression   Eczema    GERD (gastroesophageal reflux disease)    Goiter    Headache(784.0)    Hyperlipidemia    Hypertension    Partial tear of right rotator cuff    & labral tear   S/P cardiac cath 11/24/2014   Revealing nonobstructive CAD w/ tubular, discrete 40% mid lesion in the circumflex; discrete 30% proximal lesion in the  LAD; luminal irregularities, 35% mid lesion in RCA; and generic 40% mid lesion in the RCA. Medical treatment recommended.   Stroke [facility]) 2004   mild TIA    Past Surgical History:  Procedure Laterality Date   ABDOMINAL HYSTERECTOMY     oophorectomy L only   BREAST CYST ASPIRATION Left    BREAST CYST EXCISION Left    BREAST LUMPECTOMY     BREAST LUMPECTOMY WITH RADIOACTIVE SEED LOCALIZATION Left 09/08/2020   Procedure: LEFT BREAST LUMPECTOMY WITH RADIOACTIVE SEED LOCALIZATION;  Surgeon: Almond Lint, MD;  Location: [facility];  Service: General;  Laterality: Left;   BUNIONECTOMY  1990s   LEFT   CHOLECYSTECTOMY     HAND SURGERY  1990s   R hand x 2, L hand x 1   LUMBAR LAMINECTOMY/DECOMPRESSION MICRODISCECTOMY N/A 11/13/2012   Procedure: MICRO LUMBAR DECOMPRESSION L4 - L5 AND L2 - L3 2 LEVELS;  Surgeon: Javier Docker, MD;  Location: [facility];  Service: Orthopedics;  Laterality: N/A;   SHOULDER SURGERY  2006   left      Current Outpatient Medications  Medication Sig Dispense Refill   acetaminophen (TYLENOL) 500 MG tablet Take 500 mg by mouth every 6 (six) hours  as needed.     ALPRAZolam (XANAX) 0.25 MG tablet Take 1-2 tablets (0.25-0.5 mg total) by mouth at bedtime as needed for anxiety or sleep. 30 tablet 1   amLODipine (NORVASC) 10 MG tablet Take 1 tablet (10 mg total) by mouth daily. 90 tablet 1   aspirin 81 MG chewable tablet Chew by mouth daily.     atorvastatin (LIPITOR) 20 MG tablet Take 1 tablet (20 mg total) by mouth at bedtime. 90 tablet 1   calcium-vitamin D 250-100 MG-UNIT tablet Take 1 tablet by mouth 2 (two) times daily.     escitalopram (LEXAPRO) 20 MG tablet Take 0.5 tablets (10 mg total) by mouth daily. 45 tablet 1   famotidine (PEPCID) 20 MG tablet Take 1 tablet (20 mg total) by mouth 2 (two) times daily. 180 tablet 1   fluticasone furoate-vilanterol (BREO ELLIPTA) 100-25 MCG/ACT AEPB Inhale 1 puff into the lungs daily. 30 each 6   levothyroxine  (SYNTHROID) 50 MCG tablet Take 1 tablet (50 mcg total) by mouth daily before breakfast. 90 tablet 1   metoprolol tartrate (LOPRESSOR) 50 MG tablet Take 1 tablet (50 mg total) by mouth once for 1 dose. 90 minutes to 2 hours prior to Scan 1 tablet 0   simvastatin (ZOCOR) 20 MG tablet Take 20 mg by mouth daily. Take 1 Tablet Daily at 6 PM     tamoxifen (NOLVADEX) 20 MG tablet Take 1 tablet (20 mg total) by mouth daily. 90 tablet 1   No current facility-administered medications for this visit.    Allergies:   Codeine, Hydrocodone, and Sertraline hcl    Social History:  The patient  reports that she quit smoking about 24 years ago. Her smoking use included cigarettes. She started smoking about 44 years ago. She has a 20 pack-year smoking history. She has never used smokeless tobacco. She reports that she does not drink alcohol and does not use drugs.   Family History:  [name] family history includes Breast cancer in her cousin and cousin; CAD (age of onset: 49) in her mother; Cancer in her maternal grandfather; Cataracts in her father; Heart attack in her half-brother; Heart disease in her half-brother and half-brother; Kidney cancer (age of onset: 46) in her maternal aunt; Lung cancer in her brother and mother; Melanoma in her mother; Stomach cancer in her maternal uncle; Throat cancer in an other family member; Throat cancer (age of onset: 62) in her brother.    ROS:  Please see the history of present illness.   Otherwise, review of systems are positive for none.   All other systems are reviewed and negative.    PHYSICAL EXAM: VS:  BP 110/62 (BP Location: Right Arm, Patient Position: Sitting, Cuff Size: Normal)   Pulse 80   Ht 5' (1.524 m)   Wt 137 lb 6.4 oz (62.3 kg)   SpO2 94%   BMI 26.83 kg/m  , BMI Body mass index is 26.83 kg/m. GENERAL:  Well appearing HEENT:  Pupils equal round and reactive, fundi not visualized, oral mucosa unremarkable NECK:  No jugular venous distention,  waveform within normal limits, carotid upstroke brisk and symmetric, no bruits, no thyromegaly LYMPHATICS:  No cervical, inguinal adenopathy LUNGS:  Clear to auscultation bilaterally BACK:  No CVA tenderness CHEST:  Unremarkable HEART:  PMI not displaced or sustained,S1 and S2 within normal limits, no S3, no S4, no clicks, no rubs, no murmurs ABD:  Flat, positive bowel sounds normal in frequency in pitch, no bruits, no rebound,  no guarding, no midline pulsatile mass, no hepatomegaly, no splenomegaly EXT:  2 plus pulses throughout, no edema, no cyanosis no clubbing SKIN:  No rashes no nodules NEURO:  Cranial nerves II through XII grossly intact, motor grossly intact throughout [facility]  Cognitively intact, oriented to person place and time    EKG:  EKG Interpretation Date/Time:  Friday March 01 2023 11:19:24 EDT Ventricular Rate:  80 PR Interval:  160 QRS Duration:  112 QT Interval:  384 QTC Calculation: 442 R Axis:   -29  Text Interpretation: Normal sinus rhythm Minimal voltage criteria for LVH, may be normal variant ( Cornell product ) Non-specific intra-ventricular conduction delay No significant change since last tracing Confirmed by Rollene Rotunda ([id number]) on 03/01/2023 11:37:12 AM     Recent Labs: 10/08/2022: ALT 7; BUN 15; Creatinine, Ser 0.79; Hemoglobin 13.9; Platelets 116; Potassium 3.8; Sodium 137 12/07/2022: TSH 3.10    Lipid Panel    Component Value Date/Time   CHOL 157 12/07/2022 1330   TRIG 202.0 (H) 12/07/2022 1330   HDL 45.20 12/07/2022 1330   CHOLHDL 3 12/07/2022 1330   VLDL 40.4 (H) 12/07/2022 1330   LDLCALC 53 11/23/2021 1102   LDLCALC 74 04/27/2020 1056   LDLDIRECT 77.0 12/07/2022 1330      Wt Readings from Last 3 Encounters:  03/01/23 137 lb 6.4 oz (62.3 kg)  02/19/23 137 lb 9.6 oz (62.4 kg)  12/07/22 139 lb (63 kg)      Other studies Reviewed: Additional studies/ records that were reviewed today include: Lipids, CT. Review of the above records  demonstrates:  Please see elsewhere in the note.     ASSESSMENT AND PLAN:  Aortic atherosclerosis: She will be managed with continued risk reduction and screening as above.  HTN: The blood pressure is well-controlled.  Continue the meds as listed.  CAD: She has had some progressive shortness of breath which could be an anginal equivalent.  I think coronary CTA is indicated.  Further testing based on these results.  Dyslipidemia: Her LDL was 53 with an HDL of 45.  She was told to take Lipitor but she never picked this up.  She does have simvastatin at home 20 mg and she tolerated this for years with an excellent lipid profile so I would put her back on this.  Current medicines are reviewed at length with the patient today.  The patient does not have concerns regarding medicines.  The following changes have been made:  no change  Labs/ tests ordered today include:   Orders Placed This Encounter  Procedures   CT CORONARY MORPH W/CTA COR W/SCORE W/CA W/CM &/OR WO/CM   Basic metabolic panel   EKG 12-Lead     Disposition:   FU with me as needed based on the results of the above   Signed, Rollene Rotunda, MD  03/01/2023 12:07 PM    [facility]

## 2023-03-01 ENCOUNTER — Encounter: Payer: Self-pay | Admitting: Cardiology

## 2023-03-01 ENCOUNTER — Ambulatory Visit: Payer: Medicare Other | Attending: Cardiology | Admitting: Cardiology

## 2023-03-01 VITALS — BP 110/62 | HR 80 | Ht 60.0 in | Wt 137.4 lb

## 2023-03-01 DIAGNOSIS — R0602 Shortness of breath: Secondary | ICD-10-CM | POA: Diagnosis not present

## 2023-03-01 DIAGNOSIS — I700 Atherosclerosis of aorta: Secondary | ICD-10-CM

## 2023-03-01 DIAGNOSIS — I2510 Atherosclerotic heart disease of native coronary artery without angina pectoris: Secondary | ICD-10-CM | POA: Diagnosis not present

## 2023-03-01 DIAGNOSIS — I10 Essential (primary) hypertension: Secondary | ICD-10-CM | POA: Diagnosis not present

## 2023-03-01 MED ORDER — METOPROLOL TARTRATE 50 MG PO TABS: 50.0000 mg | ORAL_TABLET | Freq: Once | ORAL | 0 refills | Status: DC

## 2023-03-01 NOTE — Patient Instructions (Signed)
Medication Instructions:  Metoprolol Tartrate 50 mg ( Take 90 minutes to 2 hours Prior to Scan). *If you need a refill on your cardiac medications before your next appointment, please call your pharmacy*   Lab Work: BMET : today If you have labs (blood work) drawn today and your tests are completely normal, you will receive your results only by: MyChart Message (if you have MyChart) OR A paper copy in the mail If you have any lab test that is abnormal or we need to change your treatment, we will call you to review the results.   Testing/Procedures:   Your cardiac CT will be scheduled at one of the below locations:   [facility] [address] [place], [place] [address] [phone]  If scheduled at [facility], please arrive at the [facility] and Children's Entrance (Entrance C2) of [facility] 30 minutes prior to test start time. You can use the FREE valet parking offered at entrance C (encouraged to control the heart rate for the test)  Proceed to the [facility] Radiology Department (first floor) to check-in and test prep.  All radiology patients and guests should use entrance C2 at [facility], accessed from [facility], even though the hospital's physical address listed is [address].    If scheduled at [facility] or [facility], please arrive 15 mins early for check-in and test prep.  There is spacious parking and easy access to the radiology department from the [facility] entrance. Please enter here and check-in with the desk attendant.   Please follow these instructions carefully (unless otherwise directed):  An IV will be required for this test and Nitroglycerin will be given.   On the Night Before the Test: Be sure to Drink plenty of water. Do not consume any caffeinated/decaffeinated beverages or chocolate 12 hours prior to your test. Do not take  any antihistamines 12 hours prior to your test.   On the Day of the Test: Drink plenty of water until 1 hour prior to the test. Do not eat any food 1 hour prior to test. You may take your regular medications prior to the test.  Take metoprolol (Lopressor) two hours prior to test. If you take Furosemide/Hydrochlorothiazide/Spironolactone, please HOLD on the morning of the test. FEMALES- please wear underwire-free bra if available, avoid dresses & tight clothing  After the Test: Drink plenty of water. After receiving IV contrast, you may experience a mild flushed feeling. This is normal. On occasion, you may experience a mild rash up to 24 hours after the test. This is not dangerous. If this occurs, you can take Benadryl 25 mg and increase your fluid intake. If you experience trouble breathing, this can be serious. If it is severe call 911 IMMEDIATELY. If it is mild, please call our office. If you take any of these medications: Glipizide/Metformin, Avandament, Glucavance, please do not take 48 hours after completing test unless otherwise instructed.  We will call to schedule your test 2-4 weeks out understanding that some insurance companies will need an authorization prior to the service being performed.   For more information and frequently asked questions, please visit our website : [email or web address]  For non-scheduling related questions, please contact the cardiac imaging nurse navigator should you have any questions/concerns: Cardiac Imaging Nurse Navigators Direct Office Dial: [phone]   For scheduling needs, including cancellations and rescheduling, please call [place], [phone].    Follow-Up: At [facility], you and your health needs are our priority.  As part of our continuing mission to provide you with exceptional heart care, we have created designated Provider Care Teams.  These Care Teams include your primary Cardiologist (physician) and  Advanced Practice Providers (APPs -  Physician Assistants and Nurse Practitioners) who all work together to provide you with the care you need, when you need it.  We recommend signing up for the patient portal called "MyChart".  Sign up information is provided on this After Visit Summary.  MyChart is used to connect with patients for Virtual Visits (Telemedicine).  Patients are able to view lab/test results, encounter notes, upcoming appointments, etc.  Non-urgent messages can be sent to your provider as well.   To learn more about what you can do with MyChart, go to [email or web address].    Your next appointment:   Follow Up As Needed  Provider:   Rollene Rotunda, MD

## 2023-03-02 ENCOUNTER — Other Ambulatory Visit: Payer: Self-pay | Admitting: Cardiology

## 2023-03-03 ENCOUNTER — Other Ambulatory Visit: Payer: Self-pay | Admitting: Cardiology

## 2023-03-04 ENCOUNTER — Other Ambulatory Visit: Payer: Self-pay | Admitting: Cardiology

## 2023-03-05 ENCOUNTER — Encounter (HOSPITAL_COMMUNITY): Payer: Self-pay

## 2023-03-06 ENCOUNTER — Telehealth (HOSPITAL_COMMUNITY): Payer: Self-pay | Admitting: *Deleted

## 2023-03-06 NOTE — Telephone Encounter (Signed)
Reaching out to patient to offer assistance regarding upcoming cardiac imaging study; pt verbalizes understanding of appt date/time, parking situation and where to check in, pre-test NPO status and medications ordered, and verified current allergies; name and call back number provided for further questions should they arise Hayley Sharpe RN Navigator Cardiac Imaging [facility] [phone] office [phone] cell  

## 2023-03-07 ENCOUNTER — Ambulatory Visit (HOSPITAL_COMMUNITY)
Admission: RE | Admit: 2023-03-07 | Discharge: 2023-03-07 | Disposition: A | Discharge status: Home / Self Care | Payer: Medicare Other | Source: Ambulatory Visit | Attending: Cardiology | Admitting: Cardiology

## 2023-03-07 DIAGNOSIS — I2510 Atherosclerotic heart disease of native coronary artery without angina pectoris: Secondary | ICD-10-CM

## 2023-03-07 DIAGNOSIS — I700 Atherosclerosis of aorta: Secondary | ICD-10-CM | POA: Insufficient documentation

## 2023-03-07 DIAGNOSIS — R0602 Shortness of breath: Secondary | ICD-10-CM | POA: Insufficient documentation

## 2023-03-07 DIAGNOSIS — I10 Essential (primary) hypertension: Secondary | ICD-10-CM | POA: Diagnosis not present

## 2023-03-07 MED ORDER — NITROGLYCERIN 0.4 MG SL SUBL
SUBLINGUAL_TABLET | SUBLINGUAL | Status: AC
  Filled 2023-03-07: qty 2

## 2023-03-07 MED ORDER — NITROGLYCERIN 0.4 MG SL SUBL
0.8000 mg | SUBLINGUAL_TABLET | Freq: Once | SUBLINGUAL | Status: AC
  Administered 2023-03-07: 0.8 mg via SUBLINGUAL

## 2023-03-07 MED ORDER — IOHEXOL 350 MG/ML SOLN
95.0000 mL | Freq: Once | INTRAVENOUS | Status: AC | PRN
  Administered 2023-03-07: 95 mL via INTRAVENOUS

## 2023-03-13 ENCOUNTER — Telehealth: Payer: Self-pay | Admitting: Internal Medicine

## 2023-03-13 MED ORDER — ALPRAZOLAM 0.25 MG PO TABS: 0.2500 mg | ORAL_TABLET | Freq: Every evening | ORAL | 2 refills | Status: AC | PRN

## 2023-03-13 NOTE — Telephone Encounter (Signed)
PDMP okay, Rx sent 

## 2023-03-13 NOTE — Telephone Encounter (Signed)
Requesting: alprazolam 0.25mg   Contract: 08/07/22 UDS: 08/07/22 Last Visit: 12/07/22 Next Visit: 05/03/23 Last Refill: 08/07/22 #30 and 1RF   Please Advise

## 2023-03-13 NOTE — Addendum Note (Signed)
Addended byConrad Lake Roberts D on: 03/13/2023 12:22 PM   Modules accepted: Orders

## 2023-03-13 NOTE — Addendum Note (Signed)
Addended by: Wanda Plump on: 03/13/2023 07:24 PM   Modules accepted: Orders

## 2023-03-13 NOTE — Telephone Encounter (Signed)
Prescription Request  03/13/2023  Is this a "Controlled Substance" medicine? Yes  LOV: 12/07/2022  What is the name of the medication or equipment?   ALPRAZolam (XANAX) 0.25 MG tablet [[id number]]  Have you contacted your pharmacy to request a refill? No   Which pharmacy would you like this sent to?  [id number] [place], [place] - [address] AT [facility] [address] [place] [address] Phone: [phone] Fax: [phone]    Patient notified that their request is being sent to the clinical staff for review and that they should receive a response within 2 business days.   Please advise at Mobile [phone] (mobile)

## 2023-04-05 ENCOUNTER — Telehealth: Payer: Self-pay

## 2023-04-05 ENCOUNTER — Other Ambulatory Visit: Payer: Self-pay

## 2023-04-05 DIAGNOSIS — Z170 Estrogen receptor positive status [ER+]: Secondary | ICD-10-CM

## 2023-04-07 NOTE — Assessment & Plan Note (Signed)
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 Patient opted to forego adjuvant radiation -She started tamoxifen in 09/2020. She is tolerating well with minimal side effects. -most recent b/l MM on 09/29/21 was benign.  -left MM and [place] on 02/20/22 for palpable nodule at incision site was benign. -she is clinically doing well.  Tolerating tamoxifen without significant side effects.  Breast exam showed a 2 cm nodule at her incision site, consistent with the cyst seen on recent US.  Labs are unremarkable.  There is no clinical concern for breast cancer recurrence. -Continue surveillance and tamoxifen -Follow-up in 6 months, or sooner if needed

## 2023-04-07 NOTE — Assessment & Plan Note (Signed)
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 She taking Vit D3.   -Previously reviewed the bone strengthening quality of tamoxifen  - Repeated bone density scan from October 03, 2022 showed osteoporosis, with T-score -2.6 -We previously discussed zometa and encouraged her to consider

## 2023-04-08 ENCOUNTER — Inpatient Hospital Stay: Payer: Medicare Other | Attending: Hematology

## 2023-04-08 ENCOUNTER — Encounter: Payer: Self-pay | Admitting: Hematology

## 2023-04-08 ENCOUNTER — Inpatient Hospital Stay: Payer: Medicare Other | Admitting: Hematology

## 2023-04-08 VITALS — BP 129/62 | HR 71 | Temp 97.9°F | Resp 19 | Ht 60.0 in | Wt 138.0 lb

## 2023-04-08 DIAGNOSIS — Z170 Estrogen receptor positive status [ER+]: Secondary | ICD-10-CM

## 2023-04-08 DIAGNOSIS — Z8673 Personal history of transient ischemic attack (TIA), and cerebral infarction without residual deficits: Secondary | ICD-10-CM | POA: Insufficient documentation

## 2023-04-08 DIAGNOSIS — M85839 Other specified disorders of bone density and structure, unspecified forearm: Secondary | ICD-10-CM | POA: Diagnosis not present

## 2023-04-08 DIAGNOSIS — Z885 Allergy status to narcotic agent status: Secondary | ICD-10-CM | POA: Diagnosis not present

## 2023-04-08 DIAGNOSIS — E785 Hyperlipidemia, unspecified: Secondary | ICD-10-CM | POA: Insufficient documentation

## 2023-04-08 DIAGNOSIS — Z79810 Long term (current) use of selective estrogen receptor modulators (SERMs): Secondary | ICD-10-CM | POA: Diagnosis not present

## 2023-04-08 DIAGNOSIS — F32A Depression, unspecified: Secondary | ICD-10-CM | POA: Insufficient documentation

## 2023-04-08 DIAGNOSIS — I2510 Atherosclerotic heart disease of native coronary artery without angina pectoris: Secondary | ICD-10-CM | POA: Diagnosis not present

## 2023-04-08 DIAGNOSIS — R42 Dizziness and giddiness: Secondary | ICD-10-CM | POA: Diagnosis not present

## 2023-04-08 DIAGNOSIS — Z79899 Other long term (current) drug therapy: Secondary | ICD-10-CM | POA: Diagnosis not present

## 2023-04-08 DIAGNOSIS — R5383 Other fatigue: Secondary | ICD-10-CM | POA: Diagnosis not present

## 2023-04-08 DIAGNOSIS — I10 Essential (primary) hypertension: Secondary | ICD-10-CM | POA: Insufficient documentation

## 2023-04-08 DIAGNOSIS — M8580 Other specified disorders of bone density and structure, unspecified site: Secondary | ICD-10-CM | POA: Insufficient documentation

## 2023-04-08 DIAGNOSIS — Z1231 Encounter for screening mammogram for malignant neoplasm of breast: Secondary | ICD-10-CM | POA: Diagnosis not present

## 2023-04-08 DIAGNOSIS — Z9049 Acquired absence of other specified parts of digestive tract: Secondary | ICD-10-CM | POA: Insufficient documentation

## 2023-04-08 DIAGNOSIS — F419 Anxiety disorder, unspecified: Secondary | ICD-10-CM | POA: Insufficient documentation

## 2023-04-08 DIAGNOSIS — Z90710 Acquired absence of both cervix and uterus: Secondary | ICD-10-CM | POA: Diagnosis not present

## 2023-04-08 DIAGNOSIS — C50412 Malignant neoplasm of upper-outer quadrant of left female breast: Secondary | ICD-10-CM

## 2023-04-08 LAB — CMP (CANCER CENTER ONLY)
ALT: 6 U/L (ref 0–44)
AST: 13 U/L — ABNORMAL LOW (ref 15–41)
Albumin: 3.8 g/dL (ref 3.5–5.0)
Alkaline Phosphatase: 53 U/L (ref 38–126)
Anion gap: 4 — ABNORMAL LOW (ref 5–15)
BUN: 12 mg/dL (ref 8–23)
CO2: 29 mmol/L (ref 22–32)
Calcium: 8.9 mg/dL (ref 8.9–10.3)
Chloride: 106 mmol/L (ref 98–111)
Creatinine: 0.81 mg/dL (ref 0.44–1.00)
GFR, Estimated: >60 mL/min (ref >60)
Glucose, Bld: 96 mg/dL (ref 70–99)
Potassium: 3.7 mmol/L (ref 3.5–5.1)
Sodium: 139 mmol/L (ref 135–145)
Total Bilirubin: 0.5 mg/dL (ref 0.3–1.2)
Total Protein: 6.3 g/dL — ABNORMAL LOW (ref 6.5–8.1)

## 2023-04-08 LAB — CBC WITH DIFFERENTIAL (CANCER CENTER ONLY)
Abs Immature Granulocytes: 0.02 10*3/uL (ref 0.00–0.07)
Basophils Absolute: 0 10*3/uL (ref 0.0–0.1)
Basophils Relative: 0 %
Eosinophils Absolute: 0.1 10*3/uL (ref 0.0–0.5)
Eosinophils Relative: 1 %
HCT: 41.1 % (ref 36.0–46.0)
Hemoglobin: 13.9 g/dL (ref 12.0–15.0)
Immature Granulocytes: 0 %
Lymphocytes Relative: 33 %
Lymphs Abs: 2.5 10*3/uL (ref 0.7–4.0)
MCH: 34.5 pg — ABNORMAL HIGH (ref 26.0–34.0)
MCHC: 33.8 g/dL (ref 30.0–36.0)
MCV: 102 fL — ABNORMAL HIGH (ref 80.0–100.0)
Monocytes Absolute: 0.6 10*3/uL (ref 0.1–1.0)
Monocytes Relative: 8 %
Neutro Abs: 4.4 10*3/uL (ref 1.7–7.7)
Neutrophils Relative %: 58 %
Platelet Count: 114 10*3/uL — ABNORMAL LOW (ref 150–400)
RBC: 4.03 MIL/uL (ref 3.87–5.11)
RDW: 13.3 % (ref 11.5–15.5)
WBC Count: 7.6 10*3/uL (ref 4.0–10.5)
nRBC: 0 % (ref 0.0–0.2)

## 2023-04-08 MED ORDER — ALENDRONATE SODIUM 70 MG PO TABS: 70.0000 mg | ORAL_TABLET | ORAL | 5 refills | Status: DC

## 2023-04-08 MED ORDER — TAMOXIFEN CITRATE 20 MG PO TABS: 20.0000 mg | ORAL_TABLET | Freq: Every day | ORAL | 1 refills | Status: DC

## 2023-04-08 NOTE — Progress Notes (Signed)
[facility]   Telephone:([phone] Fax:([phone]) [phone]   Clinic Follow up Note   Patient Care Team: Wanda Plump, MD as PCP - General [name], Dimas Aguas, MD as Consulting Physician (Gynecology) Meryl Dare, MD as Consulting Physician (Gastroenterology) Harriette Bouillon, MD as Consulting Physician (General Surgery) Delfin Gant, MD as Attending Physician (Orthopedic Surgery) Beverely Low, MD as Consulting Physician (Orthopedic Surgery) Mitchel Honour, DO as Consulting Physician (Obstetrics and Gynecology) Pershing Proud, RN as Oncology Nurse Navigator Donnelly Angelica, RN as Oncology Nurse Navigator Almond Lint, MD as Consulting Physician (General Surgery) Malachy Mood, MD as Consulting Physician (Hematology) Lonie Peak, MD as Attending Physician (Radiation Oncology) Erroll Luna, [facility] (Inactive) (Pharmacist) Erroll Luna, [facility] (Inactive) (Pharmacist) Pollyann Samples, NP as Nurse Practitioner (Nurse Practitioner)  Date of Service:  04/08/2023  CHIEF COMPLAINT: f/u of left breast cancer   CURRENT THERAPY:  Tamoxifen 20mg  daily starting 09/2020   ASSESSMENT:  [name] is a 77 y.o. female with   Malignant neoplasm of upper-outer quadrant of left breast in female, estrogen receptor positive (HCC) Stage IA, c(T1bN0M0), ER+/PR+/HER2-, Grade I -Diagnosed in 07/2020, s/p left breast lumpectomy on 09/08/20 with Dr Donell Beers, path showed 0.7cm of invasive ductal carcinoma, with negative margins. Patient opted to forego adjuvant radiation -She started tamoxifen in 09/2020. She is tolerating well with minimal side effects. -most recent b/l MM on 09/29/21 was benign.  -left MM and [place] on 02/20/22 for palpable nodule at incision site was benign. -she is clinically doing well.  Tolerating tamoxifen without significant side effects.  Breast exam showed a 2 cm nodule at her incision site, consistent with the cyst seen on recent US.  Labs are unremarkable.  There is no  clinical concern for breast cancer recurrence. -Continue surveillance and tamoxifen -Follow-up in 6 months, or sooner if needed  Osteopenia -Her 06/2019 DEXA shows osteopenia at forearm radius with T-score -1.6. She taking Vit D3.   -Previously reviewed the bone strengthening quality of tamoxifen  - Repeated bone density scan from October 03, 2022 showed osteoporosis, with T-score -2.6 -We previously discussed zometa and encouraged her to consider . She declined, but she is interested in oral medicine, I called in Fosamax for her.  She will follow-up her dentist for routine care and checkup.    PLAN: - I refill Tamoxifen -I prescribe Fosamax Bone strengthen -lab reviewed -CMP-pending - I order Mammogram 09/2023 -lab f/u in 6 months  SUMMARY OF ONCOLOGIC HISTORY: Oncology History Overview Note  Cancer Staging Malignant neoplasm of upper-outer quadrant of left breast in female, estrogen receptor positive (HCC) Staging form: Breast, AJCC 8th Edition - Clinical stage from 07/26/2020: Stage IA (cT1b, cN0, cM0, G2, ER+, PR+, HER2-) - Signed by Malachy Mood, MD on 08/09/2020    Malignant neoplasm of upper-outer quadrant of left breast in female, estrogen receptor positive (HCC)  07/04/2020 Mammogram   IMPRESSION: Indeterminate 4 x 8 x 5 mm irregular hypoechoic mass left breast 1 o'clock position 4 cm from the nipple.   07/26/2020 Cancer Staging   Staging form: Breast, AJCC 8th Edition - Clinical stage from 07/26/2020: Stage IA (cT1b, cN0, cM0, G2, ER+, PR+, HER2-) - Signed by Malachy Mood, MD on 08/09/2020   07/26/2020 Initial Biopsy   Diagnosis Breast, left, needle core biopsy, upper outer 1 o'clock - INVASIVE DUCTAL CARCINOMA - SEE COMMENT Microscopic Comment Based on the biopsy, the carcinoma appears Nottingham grade 2 of 3 and measures 0.7 cm in greatest  linear extent. Prognostic markers (ER/PR/ki-67/HER2) are pending and will be reported in an addendum. Dr. Rayetta Pigg reviewed the case and  agrees with the above diagnosis. These results were called to [facility] on July 27, 2018.   07/26/2020 Receptors her2   PROGNOSTIC INDICATORS Results: IMMUNOHISTOCHEMICAL AND MORPHOMETRIC ANALYSIS PERFORMED MANUALLY The tumor cells are NEGATIVE for Her2 (1+). Estrogen Receptor: 90%, POSITIVE, STRONG STAINING INTENSITY Progesterone Receptor: 80%, POSITIVE, STRONG STAINING INTENSITY Proliferation Marker Ki67: 2%   08/03/2020 Initial Diagnosis   Malignant neoplasm of upper-outer quadrant of left breast in female, estrogen receptor positive (HCC)   09/08/2020 Surgery   LEFT BREAST LUMPECTOMY WITH RADIOACTIVE SEED LOCALIZATION by Dr Donell Beers   09/08/2020 Pathology Results   FINAL MICROSCOPIC DIAGNOSIS:   A. 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  - Margins not involved.  - Invasive carcinoma 0.1 cm from anterior margin.  - Biopsy site and biopsy clip.  - Fibrocystic changes.  - See oncology table.     09/2020 -  Anti-estrogen oral therapy   Tamoxifen 20mg  daily starting 09/2020   12/27/2020 Cancer Staging   Staging form: Breast, AJCC 8th Edition - Pathologic: Stage Unknown (pT1b, pNX, cM0, G1, ER+, PR+, HER2-) - Signed by Pollyann Samples, NP on 12/27/2020 Histologic grading system: 3 grade system   12/29/2020 Survivorship   SCP delivered by Santiago Glad, NP    01/17/2021 Genetic Testing   Negative genetic testing:  No pathogenic variants detected on the [employer] CancerNext-Expanded + RNAinsight panel. The report date is 01/17/2021.   The CancerNext-Expanded + RNAinsight gene panel offered by [employer] and includes sequencing and rearrangement analysis for the following 77 genes: AIP, ALK, APC, ATM, AXIN2, BAP1, BARD1, BLM, BMPR1A, BRCA1, BRCA2, BRIP1, CDC73, CDH1, CDK4, CDKN1B, CDKN2A, CHEK2, CTNNA1, DICER1, FANCC, FH, FLCN, GALNT12, KIF1B, LZTR1, MAX, MEN1, MET, MLH1, MSH2, MSH3, MSH6, MUTYH, NBN, NF1, NF2, NTHL1, PALB2, PHOX2B, PMS2, POT1,  PRKAR1A, PTCH1, PTEN, RAD51C, RAD51D, RB1, RECQL, RET, SDHA, SDHAF2, SDHB, SDHC, SDHD, SMAD4, SMARCA4, SMARCB1, SMARCE1, STK11, SUFU, TMEM127, TP53, TSC1, TSC2, VHL and XRCC2 (sequencing and deletion/duplication); EGFR, EGLN1, HOXB13, KIT, MITF, PDGFRA, POLD1 and POLE (sequencing only); EPCAM and GREM1 (deletion/duplication only). RNA data is routinely analyzed for use in variant interpretation for all genes.      INTERVAL HISTORY:  [name] is here for a follow up of left breast cancer. She was last seen by me on 10/08/2022. She presents to the clinic alone. Pt state that she had a Ct  scan done on her heart and it was fine. Pt state that she has some fatigue and when she walks she is dizzy. Pt is active around her home. She goes walking and she has a stationary bike at home.       All other systems were reviewed with the patient and are negative.  MEDICAL HISTORY:  Past Medical History:  Diagnosis Date   Anxiety and depression    Arthritis    Blood transfusion without reported diagnosis 1989   GB hemorrage    Cataract    Bil/no surgery yet.   Coccyx pain    Secondary to scar tissue from old pressure ulcer after back surgery, Dr. Luisa Hart   Coronary artery disease    Depression 2010   panic attacks and depression   Eczema    GERD (gastroesophageal reflux disease)    Goiter    Headache(784.0)    Hyperlipidemia    Hypertension    Partial  tear of right rotator cuff    & labral tear   S/P cardiac cath 11/24/2014   Revealing nonobstructive CAD w/ tubular, discrete 40% mid lesion in the circumflex; discrete 30% proximal lesion in the LAD; luminal irregularities, 35% mid lesion in RCA; and generic 40% mid lesion in the RCA. Medical treatment recommended.   Stroke [facility]) 2004   mild TIA    SURGICAL HISTORY: Past Surgical History:  Procedure Laterality Date   ABDOMINAL HYSTERECTOMY     oophorectomy L only   BREAST CYST ASPIRATION Left    BREAST CYST EXCISION Left     BREAST LUMPECTOMY     BREAST LUMPECTOMY WITH RADIOACTIVE SEED LOCALIZATION Left 09/08/2020   Procedure: LEFT BREAST LUMPECTOMY WITH RADIOACTIVE SEED LOCALIZATION;  Surgeon: Almond Lint, MD;  Location: [facility];  Service: General;  Laterality: Left;   BUNIONECTOMY  1990s   LEFT   CHOLECYSTECTOMY     HAND SURGERY  1990s   R hand x 2, L hand x 1   LUMBAR LAMINECTOMY/DECOMPRESSION MICRODISCECTOMY N/A 11/13/2012   Procedure: MICRO LUMBAR DECOMPRESSION L4 - L5 AND L2 - L3 2 LEVELS;  Surgeon: Javier Docker, MD;  Location: [facility];  Service: Orthopedics;  Laterality: N/A;   SHOULDER SURGERY  2006   left     I have reviewed the social history and family history with the patient and they are unchanged from previous note.  ALLERGIES:  is allergic to codeine, hydrocodone, and sertraline hcl.  MEDICATIONS:  Current Outpatient Medications  Medication Sig Dispense Refill   alendronate (FOSAMAX) 70 MG tablet Take 1 tablet (70 mg total) by mouth once a week. Take with a full glass of water on an empty stomach. 4 tablet 5   acetaminophen (TYLENOL) 500 MG tablet Take 500 mg by mouth every 6 (six) hours as needed.     ALPRAZolam (XANAX) 0.25 MG tablet Take 1-2 tablets (0.25-0.5 mg total) by mouth at bedtime as needed for anxiety or sleep. 30 tablet 2   amLODipine (NORVASC) 10 MG tablet Take 1 tablet (10 mg total) by mouth daily. 90 tablet 1   aspirin 81 MG chewable tablet Chew by mouth daily.     atorvastatin (LIPITOR) 20 MG tablet Take 1 tablet (20 mg total) by mouth at bedtime. 90 tablet 1   calcium-vitamin D 250-100 MG-UNIT tablet Take 1 tablet by mouth 2 (two) times daily.     escitalopram (LEXAPRO) 20 MG tablet Take 0.5 tablets (10 mg total) by mouth daily. 45 tablet 1   famotidine (PEPCID) 20 MG tablet Take 1 tablet (20 mg total) by mouth 2 (two) times daily. 180 tablet 1   fluticasone furoate-vilanterol (BREO ELLIPTA) 100-25 MCG/ACT AEPB Inhale 1 puff into the lungs daily. 30  each 6   levothyroxine (SYNTHROID) 50 MCG tablet Take 1 tablet (50 mcg total) by mouth daily before breakfast. 90 tablet 1   metoprolol tartrate (LOPRESSOR) 50 MG tablet Take 1 tablet (50 mg total) by mouth once for 1 dose. 90 minutes to 2 hours prior to Scan 1 tablet 0   simvastatin (ZOCOR) 20 MG tablet Take 20 mg by mouth daily. Take 1 Tablet Daily at 6 PM     tamoxifen (NOLVADEX) 20 MG tablet Take 1 tablet (20 mg total) by mouth daily. 90 tablet 1   No current facility-administered medications for this visit.    PHYSICAL EXAMINATION: ECOG PERFORMANCE STATUS: 0 - Asymptomatic  Vitals:   04/08/23 1158  BP: 129/62  Pulse:  71  Resp: 19  Temp: 97.9 F (36.6 C)  SpO2: 95%   Wt Readings from Last 3 Encounters:  04/08/23 138 lb (62.6 kg)  03/01/23 137 lb 6.4 oz (62.3 kg)  02/19/23 137 lb 9.6 oz (62.4 kg)     GENERAL:alert, no distress and comfortable SKIN: skin color normal, no rashes or significant lesions EYES: normal, Conjunctiva are pink and non-injected, sclera clear  NEURO: alert & oriented x 3 with fluent speech NECK: (-)supple, thyroid normal size, non-tender, without nodularity LYMPH:  (-) no palpable lymphadenopathy in the cervical, axillary  LUNGS: (-) clear to auscultation and percussion with normal breathing effort HEART: (-) regular rate & rhythm and no murmurs and no lower extremity edema BREAST; rt breast no palpable mass breast exam benign. Lt breast upper outer quadrant  palpable mass 1 x 1.5 at 2 o' clock position, near nipple. LABORATORY DATA:  I have reviewed the data as listed    Latest Ref Rng & Units 04/08/2023   11:43 AM 10/08/2022    1:04 PM 04/02/2022   11:25 AM  CBC  WBC 4.0 - 10.5 K/uL 7.6  7.2  7.8   Hemoglobin 12.0 - 15.0 g/dL 13.0  86.5  78.4   Hematocrit 36.0 - 46.0 % 41.1  40.5  40.3   Platelets 150 - 400 K/uL 114  116  112         Latest Ref Rng & Units 04/08/2023   11:43 AM 03/01/2023   12:10 PM 10/08/2022    1:04 PM  CMP  Glucose 70 -  99 mg/dL 96  93  696   BUN 8 - 23 mg/dL 12  13  15    Creatinine 0.44 - 1.00 mg/dL 2.95  2.84  1.32   Sodium 135 - 145 mmol/L 139  140  137   Potassium 3.5 - 5.1 mmol/L 3.7  4.9  3.8   Chloride 98 - 111 mmol/L 106  102  104   CO2 22 - 32 mmol/L 29  24  26    Calcium 8.9 - 10.3 mg/dL 8.9  9.5  9.0   Total Protein 6.5 - 8.1 g/dL 6.3   6.2   Total Bilirubin 0.3 - 1.2 mg/dL 0.5   0.4   Alkaline Phos 38 - 126 U/L 53   57   AST 15 - 41 U/L 13   12   ALT 0 - 44 U/L 6   7       RADIOGRAPHIC STUDIES: I have personally reviewed the radiological images as listed and agreed with the findings in the report. No results found.    Orders Placed This Encounter  Procedures   MM Digital Screening    Standing Status:   Future    Standing Expiration Date:   04/07/2024    Order Specific Question:   Reason for Exam (SYMPTOM  OR DIAGNOSIS REQUIRED)    Answer:   SCREENING    Order Specific Question:   Preferred imaging location?    Answer:   [facility]   All questions were answered. The patient knows to call the clinic with any problems, questions or concerns. No barriers to learning was detected. The total time spent in the appointment was 25 minutes.     Malachy Mood, MD 04/08/2023   Carolin Coy, CMA, am acting as scribe for Malachy Mood, MD.   I have reviewed the above documentation for accuracy and completeness, and I agree with the above.

## 2023-04-11 NOTE — Telephone Encounter (Signed)
Open by mistake

## 2023-04-12 ENCOUNTER — Other Ambulatory Visit: Payer: Self-pay | Admitting: Internal Medicine

## 2023-05-03 ENCOUNTER — Encounter: Payer: Medicare Other | Admitting: Internal Medicine

## 2023-06-04 ENCOUNTER — Encounter: Payer: Self-pay | Admitting: Internal Medicine

## 2023-06-04 ENCOUNTER — Ambulatory Visit: Payer: Medicare Other | Admitting: Internal Medicine

## 2023-06-04 VITALS — BP 126/80 | HR 72 | Temp 97.8°F | Resp 16 | Ht 60.0 in | Wt 141.4 lb

## 2023-06-04 DIAGNOSIS — I2510 Atherosclerotic heart disease of native coronary artery without angina pectoris: Secondary | ICD-10-CM | POA: Diagnosis not present

## 2023-06-04 DIAGNOSIS — D7589 Other specified diseases of blood and blood-forming organs: Secondary | ICD-10-CM

## 2023-06-04 DIAGNOSIS — E785 Hyperlipidemia, unspecified: Secondary | ICD-10-CM | POA: Diagnosis not present

## 2023-06-04 DIAGNOSIS — E039 Hypothyroidism, unspecified: Secondary | ICD-10-CM | POA: Diagnosis not present

## 2023-06-04 DIAGNOSIS — Z0000 Encounter for general adult medical examination without abnormal findings: Secondary | ICD-10-CM | POA: Diagnosis not present

## 2023-06-04 DIAGNOSIS — Z0001 Encounter for general adult medical examination with abnormal findings: Secondary | ICD-10-CM

## 2023-06-04 MED ORDER — AMLODIPINE BESYLATE 10 MG PO TABS: 5.0000 mg | ORAL_TABLET | Freq: Every day | ORAL | Status: DC | PRN

## 2023-06-04 NOTE — Progress Notes (Unsigned)
Subjective:    Patient ID: [name], female    DOB: 07/29/1945, 77 y.o.   MRN: [id number]  DOS:  06/04/2023 Type of visit - description: CPX  Here for CPX Chronic medical problems addressed.  She is doing well. Has chronic mild urinary urgency. Denies GI problems, no diarrhea no blood in the stools. lung disease: Able to do all her ADLs without difficulty breathing. Saw cardiology, chart reviewed. Not taking any BP meds at this point.  BP when checked at home is normal.  Wt Readings from Last 3 Encounters:  06/04/23 141 lb 6 oz (64.1 kg)  04/08/23 138 lb (62.6 kg)  03/01/23 137 lb 6.4 oz (62.3 kg)    Review of Systems See above   Past Medical History:  Diagnosis Date   Anxiety and depression    Arthritis    Blood transfusion without reported diagnosis 1989   GB hemorrage    Cataract    Bil/no surgery yet.   Coccyx pain    Secondary to scar tissue from old pressure ulcer after back surgery, Dr. Luisa Hart   Coronary artery disease    Depression 2010   panic attacks and depression   Eczema    GERD (gastroesophageal reflux disease)    Goiter    Headache(784.0)    Hyperlipidemia    Hypertension    Partial tear of right rotator cuff    & labral tear   S/P cardiac cath 11/24/2014   Revealing nonobstructive CAD w/ tubular, discrete 40% mid lesion in the circumflex; discrete 30% proximal lesion in the LAD; luminal irregularities, 35% mid lesion in RCA; and generic 40% mid lesion in the RCA. Medical treatment recommended.   Stroke [facility]) 2004   mild TIA    Past Surgical History:  Procedure Laterality Date   ABDOMINAL HYSTERECTOMY     oophorectomy L only   BREAST CYST ASPIRATION Left    BREAST CYST EXCISION Left    BREAST LUMPECTOMY     BREAST LUMPECTOMY WITH RADIOACTIVE SEED LOCALIZATION Left 09/08/2020   Procedure: LEFT BREAST LUMPECTOMY WITH RADIOACTIVE SEED LOCALIZATION;  Surgeon: Almond Lint, MD;  Location: [facility];  Service: General;   Laterality: Left;   BUNIONECTOMY  1990s   LEFT   CHOLECYSTECTOMY     HAND SURGERY  1990s   R hand x 2, L hand x 1   LUMBAR LAMINECTOMY/DECOMPRESSION MICRODISCECTOMY N/A 11/13/2012   Procedure: MICRO LUMBAR DECOMPRESSION L4 - L5 AND L2 - L3 2 LEVELS;  Surgeon: Javier Docker, MD;  Location: [facility];  Service: Orthopedics;  Laterality: N/A;   SHOULDER SURGERY  2006   left     Current Outpatient Medications  Medication Instructions   acetaminophen (TYLENOL) 500 mg, Oral, Every 6 hours PRN   alendronate (FOSAMAX) 70 mg, Oral, Weekly, Take with a full glass of water on an empty stomach.   ALPRAZolam (XANAX) 0.25-0.5 mg, Oral, At bedtime PRN   amLODipine (NORVASC) 10 mg, Oral, Daily   aspirin 81 MG chewable tablet Oral, Daily   atorvastatin (LIPITOR) 20 mg, Oral, Daily at bedtime   calcium-vitamin D 250-100 MG-UNIT tablet 1 tablet, Oral, 2 times daily   escitalopram (LEXAPRO) 10 mg, Oral, Daily   famotidine (PEPCID) 20 mg, Oral, 2 times daily   fluticasone furoate-vilanterol (BREO ELLIPTA) 100-25 MCG/ACT AEPB 1 puff, Inhalation, Daily   levothyroxine (SYNTHROID) 50 mcg, Oral, Daily before breakfast   metoprolol tartrate (LOPRESSOR) 50 mg, Oral,  Once, 90 minutes to 2 hours  prior to Scan   simvastatin (ZOCOR) 20 mg, Oral, Daily, Take 1 Tablet Daily at 6 PM   tamoxifen (NOLVADEX) 20 mg, Oral, Daily       Objective:   Physical Exam BP 126/80   Pulse 72   Temp 97.8 F (36.6 C) (Oral)   Resp 16   Ht 5' (1.524 m)   Wt 141 lb 6 oz (64.1 kg)   SpO2 98%   BMI 27.61 kg/m  General: Well developed, NAD, BMI noted Neck: No  thyromegaly  HEENT:  Normocephalic . Face symmetric, atraumatic Lungs:  Clear bilaterally, scattered rhonchi only. Normal respiratory effort, no intercostal retractions, no accessory muscle use. Heart: RRR,  no murmur.  Abdomen:  Not distended, soft, non-tender. No rebound or rigidity.   Lower extremities: no pretibial edema bilaterally  Skin: Exposed areas  without rash. Not pale. Not jaundice Neurologic:  alert & oriented X3.  Speech normal, gait appropriate for age and unassisted Strength symmetric and appropriate for age.  Psych: Cognition and judgment appear intact.  Cooperative with normal attention span and concentration.  Behavior appropriate. No anxious or depressed appearing.     Assessment    Assessment  Prediabetes HTN Hyperlipidemia Anxiety depression, insomnia- xanax rx by pcp GERD CV: TIA? (2004) CAD: catheterization 2009 and 2016: Non-obstructive CAD, 40% blockages.  Coronary CTA: LAD 25 to 49%, medical management Goiter: Last ultrasound 12-2014, nodules decrease in size MSK:  --Back - coccyxt pain, lumbar surgery 2014, chronic residual pain --on Tramadol (rx by back surgeon)   Vit d def Chronic Dizziness Eczema/psoriasis , sees derm Breast cancer 07/26/2020.  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  See OV note pulmonary 10/2020. Osteoporosis:   2016 T score -1.1, 06/2019 T score - 1.6; 09-2022: T-score  (-) 2.6    PLAN: Here for CPX -Td 2012  - PNM 23: 2010;  prevnar--2015.  PNM 20: Declined today - shingles shot 2013 -Shingrix : Recommended -Recommend Tdap, PNM 20, Shingrix, RSV, COVID-19, flu shot.  Reluctant to take any vaccines -female care: See previous entry, no further Pap smears. MMG: 09-2022 ([facility]) - CCS: Cscope 2004, + polyps adenomatous colonoscopy  07-2009, no polyps.  + cologuard 2019, Cscope 01/2018, 2 polyps, next due, GI recommended office visit.  Patient is not sure if she likes to proceed which she is okay per guidelines.  If she changes her mind to contact GI.  Always notify me if she has GI symptoms. -Labs:    FLP B12 folic acid TSH -Diet and exercise discussed.  -ACP discussed & info provided    HTN: Was on amlodipine 5 mg daily, BPs in the ambulatory setting great, so she is stop it and is reluctant to  restart.  Plan: Continue checking BPs, restart amlodipine if needed Hyperlipidemia: Based on last FLP switch from simvastatin to atorvastatin, last LFTs normal, check FLP. Increase MCV: Check B12 and folic acid Hypothyroidism:  feeling great, check TSH. ILD: On Breo Ellipta, feeling great. Aortic sclerosis, difficulty breathing.  Saw pulmonary and was referred to cardiology , seen 03/01/2023, DDx include angina equivalent.  Coronary CTA: Mild coronary  plaque, LAD has 25 to 49%.  Medical management. Mild CAD: See above RTC 6 months. ===== Prediabetes: Check A1c.  Further advised with results. HTN: Seems well-controlled, continue amlodipine, last BMP okay. High cholesterol: On simvastatin, LFTs okay, check FLP.  Adjust medications if needed. Hypothyroidism: On Synthroid, check TSH.  Adjust medications if  needed. History of breast cancer: Saw oncology 10/08/2022, on tamoxifen. Osteoporosis: Bone density test 09/2022, working w/ oncology ref  treatment. ILD: Has chronic cough, she actually went to the beach and took long walks without DOE.  To have a CT soon by pulmonary. Gait: Reports gait starting to feel wobbly, offered P.T. but elected to start going to the gym to get some of her lower extremity muscle strength back. RTC 4 months CPX

## 2023-06-04 NOTE — Patient Instructions (Addendum)
Continue checking your blood pressure regularly.  Restart amlodipine if necessary and let me know. Blood pressure goal:  between 110/65 and  135/85. If it is consistently higher or lower, let me know     GO TO THE LAB : Get the blood work     Next visit with me in 6 months ; please schedule it at the front desk   Vaccines I recommend: Tdap (tetanus) Flu shot Shingrix (shingles) COVID-vaccine RSV Pneumonia shot.      "Health Care Power of attorney" ,  "Living will" (Advance care planning documents)  If you already have a living will or healthcare power of attorney, is recommended you bring the copy to be scanned in your chart.   The document will be available to all the doctors you see in the system.  Advance care planning is a process that supports adults in  understanding and sharing their preferences regarding future medical care.  [name] preferences are recorded in documents called Advance Directives and the can be modified at any time while the patient is in full mental capacity.   If you don't have one, please consider create one.      More information at: [email or web address]

## 2023-06-05 ENCOUNTER — Encounter: Payer: Self-pay | Admitting: Internal Medicine

## 2023-06-05 LAB — LIPID PANEL
Cholesterol: 161 mg/dL (ref 0–200)
HDL: 49.5 mg/dL (ref >39.00)
LDL Cholesterol: 83 mg/dL (ref 0–99)
NonHDL: 111.85
Total CHOL/HDL Ratio: 3
Triglycerides: 143 mg/dL (ref 0.0–149.0)
VLDL: 28.6 mg/dL (ref 0.0–40.0)

## 2023-06-05 LAB — TSH: TSH: 2.29 u[IU]/mL (ref 0.35–5.50)

## 2023-06-05 LAB — B12 AND FOLATE PANEL
Folate: 11.5 ng/mL (ref >5.9)
Vitamin B-12: 62 pg/mL — ABNORMAL LOW (ref 211–911)

## 2023-06-05 NOTE — Assessment & Plan Note (Signed)
Here for CPX  HTN: Was on amlodipine 5 mg daily, BPs were wnl, so she d/c amlodipine and is reluctant to restart.  Plan: Continue checking BPs, restart amlodipine if needed Hyperlipidemia: Based on last FLP switch from simvastatin to atorvastatin, last LFTs normal, check FLP. Increase MCV: Check B12 and folic acid Hypothyroidism:  feeling great, check TSH. ILD: On Breo Ellipta, feeling great. Aortic sclerosis, difficulty breathing.  Saw pulmonary and was referred to cardiology , seen 03/01/2023, DDx include angina equivalent.  Coronary CTA: Mild coronary  plaque, LAD has 25 to 49%.  Medical management. Mild CAD: See above RTC 6 months.

## 2023-06-05 NOTE — Assessment & Plan Note (Addendum)
Here for CPX -Td 2012  - PNM 23: 2010;  prevnar--2015.  PNM 20: Declined today - shingles shot 2013 -Shingrix : Recommended -Recommend Tdap, PNM 20, Shingrix, RSV, COVID-19, flu shot.  Reluctant to take any vaccines -female care: See previous entry, no further Pap smears. MMG: 09-2022 ([facility]) - CCS: Cscope 2004, + polyps adenomatous colonoscopy  07-2009, no polyps.  + cologuard 2019, Cscope 01/2018, 2 polyps, next due, GI recommended office visit.  Patient is not sure if she likes to proceed which she is okay per guidelines.  If she changes her mind to contact GI.    -Labs:    FLP B12 folic acid TSH -Diet and exercise discussed.  - H-POA--info provided

## 2023-06-11 MED ORDER — ATORVASTATIN CALCIUM 40 MG PO TABS: 40.0000 mg | ORAL_TABLET | Freq: Every day | ORAL | 1 refills | Status: DC

## 2023-06-11 NOTE — Addendum Note (Signed)
Addended byConrad [place] D on: 06/11/2023 09:32 AM   Modules accepted: Orders

## 2023-06-13 ENCOUNTER — Other Ambulatory Visit: Payer: Self-pay | Admitting: Internal Medicine

## 2023-06-18 ENCOUNTER — Ambulatory Visit (INDEPENDENT_AMBULATORY_CARE_PROVIDER_SITE_OTHER): Payer: Medicare Other | Admitting: *Deleted

## 2023-06-18 DIAGNOSIS — E538 Deficiency of other specified B group vitamins: Secondary | ICD-10-CM

## 2023-06-18 MED ORDER — CYANOCOBALAMIN 1000 MCG/ML IJ SOLN
1000.0000 ug | Freq: Once | INTRAMUSCULAR | Status: AC
  Administered 2023-06-18: 1000 ug via INTRAMUSCULAR

## 2023-06-18 NOTE — Progress Notes (Signed)
Pt here for #1of 4 weekly B12 injections per PCP order. Injection given in LD without complications. Next injection scheduled for 06/25/23.

## 2023-06-25 ENCOUNTER — Ambulatory Visit (INDEPENDENT_AMBULATORY_CARE_PROVIDER_SITE_OTHER): Payer: Medicare Other

## 2023-06-25 DIAGNOSIS — E538 Deficiency of other specified B group vitamins: Secondary | ICD-10-CM

## 2023-06-25 MED ORDER — CYANOCOBALAMIN 1000 MCG/ML IJ SOLN
1000.0000 ug | Freq: Once | INTRAMUSCULAR | Status: AC
  Administered 2023-06-25: 1000 ug via INTRAMUSCULAR

## 2023-06-25 NOTE — Progress Notes (Signed)
[name] is a 77 y.o. female presents to the office today for 2nd weekly B12 injection, per physician's orders. Original order: "06/09/23: Advise  patient:-- B12 is low.  Recommend B12 injection weekly x 4.  Then B12 over-the-counter 2000 mcg daily." Cyanocobalamin 1000 mg/ml IM was administered L deltoid today. Patient tolerated injection. Patient due for follow up labs/provider appt: No. Date due: n/a Patient next injection due: 1 week for 3rd weekly B12 injection, appt made Yes  Creft, Melton Alar L

## 2023-07-02 ENCOUNTER — Ambulatory Visit (INDEPENDENT_AMBULATORY_CARE_PROVIDER_SITE_OTHER): Payer: Medicare Other

## 2023-07-02 DIAGNOSIS — E538 Deficiency of other specified B group vitamins: Secondary | ICD-10-CM | POA: Diagnosis not present

## 2023-07-02 MED ORDER — CYANOCOBALAMIN 1000 MCG/ML IJ SOLN
1000.0000 ug | Freq: Once | INTRAMUSCULAR | Status: AC
  Administered 2023-07-02: 1000 ug via INTRAMUSCULAR

## 2023-07-02 NOTE — Progress Notes (Signed)
[name] is a 77 y.o. female presents to the office today for 3/4 weekly B12 injections per physician's orders. Original order: "06/09/23: Advise  patient:-- B12 is low.  Recommend B12 injection weekly x 4.  Then B12 over-the-counter 2000 mcg daily." Cyanocobalamin 1000 mg/ml IM was administered L deltoid today. Patient tolerated injection. Patient due for follow up labs/provider appt: No. Date due: n/a Patient next injection due: 1 week for 4th rd weekly B12 injection, appt made Yes  Creft, Melton Alar L

## 2023-07-08 ENCOUNTER — Other Ambulatory Visit: Payer: Self-pay | Admitting: Internal Medicine

## 2023-07-08 NOTE — Progress Notes (Addendum)
[name] is a 77 y.o. female presents to the office today for 4/4 weekly B12 injections per physician's orders. Original order: "06/09/23: Advise  patient:-- B12 is low.  Recommend B12 injection weekly x 4.  Then B12 over-the-counter 2000 mcg daily." Cyanocobalamin 1000 mg/ml IM was administered L  Deltoid today. Patient tolerated injection. Patient due for follow up labs/provider appt: No. Date due: n/a Patient next injection due: 1 month for Monthly B12 injection, appt made Yes    Creft, Melton Alar L

## 2023-07-09 ENCOUNTER — Ambulatory Visit (INDEPENDENT_AMBULATORY_CARE_PROVIDER_SITE_OTHER): Payer: Medicare Other

## 2023-07-09 DIAGNOSIS — E538 Deficiency of other specified B group vitamins: Secondary | ICD-10-CM | POA: Diagnosis not present

## 2023-07-09 MED ORDER — CYANOCOBALAMIN 1000 MCG/ML IJ SOLN
1000.0000 ug | Freq: Once | INTRAMUSCULAR | Status: AC
  Administered 2023-07-09: 1000 ug via INTRAMUSCULAR

## 2023-07-26 ENCOUNTER — Ambulatory Visit
Admission: RE | Admit: 2023-07-26 | Discharge: 2023-07-26 | Disposition: A | Discharge status: Home / Self Care | Payer: Medicare Other | Source: Ambulatory Visit | Attending: Acute Care | Admitting: Acute Care

## 2023-07-26 DIAGNOSIS — I700 Atherosclerosis of aorta: Secondary | ICD-10-CM | POA: Diagnosis not present

## 2023-07-26 DIAGNOSIS — J849 Interstitial pulmonary disease, unspecified: Secondary | ICD-10-CM

## 2023-07-26 DIAGNOSIS — J8410 Pulmonary fibrosis, unspecified: Secondary | ICD-10-CM | POA: Diagnosis not present

## 2023-07-26 DIAGNOSIS — I2510 Atherosclerotic heart disease of native coronary artery without angina pectoris: Secondary | ICD-10-CM | POA: Diagnosis not present

## 2023-08-01 ENCOUNTER — Other Ambulatory Visit: Payer: Self-pay | Admitting: Internal Medicine

## 2023-08-09 ENCOUNTER — Ambulatory Visit (INDEPENDENT_AMBULATORY_CARE_PROVIDER_SITE_OTHER): Payer: Medicare Other | Admitting: Emergency Medicine

## 2023-08-09 DIAGNOSIS — E538 Deficiency of other specified B group vitamins: Secondary | ICD-10-CM | POA: Diagnosis not present

## 2023-08-09 MED ORDER — CYANOCOBALAMIN 1000 MCG/ML IJ SOLN
1000.0000 ug | Freq: Once | INTRAMUSCULAR | Status: AC
  Administered 2023-08-09: 1000 ug via INTRAMUSCULAR

## 2023-08-09 NOTE — Addendum Note (Signed)
Addended by: Marian Sorrow D on: 08/09/2023 11:03 AM   Modules accepted: Orders

## 2023-08-09 NOTE — Progress Notes (Signed)
Patient here for monthly b12 injection per physicians order.  Injection given in left deltoid and patient tolerated well.  

## 2023-08-22 ENCOUNTER — Other Ambulatory Visit: Payer: Self-pay | Admitting: Internal Medicine

## 2023-09-04 ENCOUNTER — Ambulatory Visit: Payer: Medicare Other | Admitting: Pulmonary Disease

## 2023-09-04 ENCOUNTER — Encounter: Payer: Self-pay | Admitting: Pulmonary Disease

## 2023-09-09 ENCOUNTER — Telehealth: Payer: Self-pay | Admitting: Pulmonary Disease

## 2023-09-09 DIAGNOSIS — R0600 Dyspnea, unspecified: Secondary | ICD-10-CM

## 2023-09-09 MED ORDER — FLUTICASONE FUROATE-VILANTEROL 100-25 MCG/ACT IN AEPB: 1.0000 | INHALATION_SPRAY | Freq: Every day | RESPIRATORY_TRACT | 1 refills | Status: DC

## 2023-09-09 NOTE — Telephone Encounter (Signed)
Refill sent to get him to appointment.

## 2023-09-09 NOTE — Telephone Encounter (Signed)
PT needs Breo refill. Has future appt w/Dr. Rexene Edison.   Her # is [phone]  [facility]et is [facility] on [address]

## 2023-09-10 ENCOUNTER — Telehealth: Payer: Self-pay | Admitting: Pulmonary Disease

## 2023-09-10 ENCOUNTER — Ambulatory Visit (INDEPENDENT_AMBULATORY_CARE_PROVIDER_SITE_OTHER): Payer: Medicare Other

## 2023-09-10 DIAGNOSIS — E538 Deficiency of other specified B group vitamins: Secondary | ICD-10-CM

## 2023-09-10 MED ORDER — CYANOCOBALAMIN 1000 MCG/ML IJ SOLN
1000.0000 ug | Freq: Once | INTRAMUSCULAR | Status: AC
  Administered 2023-09-10: 1000 ug via INTRAMUSCULAR

## 2023-09-10 NOTE — Telephone Encounter (Signed)
Patient went to pharmacy to pick up Breo inhaler but the price has increased to $300. She needs an alternative. Please call and advise [phone]  Pharmacy: [facility] on [address]

## 2023-09-10 NOTE — Progress Notes (Signed)
 [name] is a 78 y.o. female presents to the office today for 4/4 weekly B12 injections per physician's orders. Original order: "06/09/23: Advise  patient:-- B12 is low.  Recommend B12 injection weekly x 4.  Then B12 over-the-counter 2000 mcg daily." Cyanocobalamin 1000 mg/ml IM was administered L  Deltoid today. Patient tolerated injection. Patient due for follow up labs/provider appt: No. Date due: n/a Patient next injection due: 1 month for Monthly B12 injection, appt made Yes    Creft, Melton Alar L

## 2023-09-11 NOTE — Telephone Encounter (Signed)
Patient went to pharmacy to pick up Breo inhaler but the price has increased to $300. She needs an alternative.  Dr Judeth Horn, what do you recommend?

## 2023-09-11 NOTE — Telephone Encounter (Signed)
What is most cost effective ICS/LABA? Thanks!

## 2023-09-12 ENCOUNTER — Other Ambulatory Visit (HOSPITAL_COMMUNITY): Payer: Self-pay

## 2023-09-12 ENCOUNTER — Telehealth: Payer: Self-pay

## 2023-09-12 NOTE — Telephone Encounter (Signed)
Pharmacy Patient Advocate Encounter   ICS/LABA co-pays are as follows:   Advair Diskus (Brand)  plan exclusion  (Generic)  plan exclusion Advair HFA (Brand)  plan exclusion   (Generic) $47.00 Wixela Inhub $47.00 Breo Ellipta   too soon until 10-03-23 Dulera  $100.00 Symbicort (Brand) $47.00   (Generic)  plan exclusion Breyna  plan exclusion

## 2023-09-12 NOTE — Telephone Encounter (Signed)
Please see new encounter created 09-12-2023 for alternative co-pays

## 2023-09-16 MED ORDER — FLUTICASONE-SALMETEROL 250-50 MCG/ACT IN AEPB: 1.0000 | INHALATION_SPRAY | Freq: Two times a day (BID) | RESPIRATORY_TRACT | 6 refills | Status: DC

## 2023-09-16 NOTE — Telephone Encounter (Signed)
 Wixela 1 puff twice a day prescribed.  Anticipated co-pay is $47 a month.  This is the most cost effective based on test claim data, related to her insurance plan.  Monte Fantasia is to replace Breo.

## 2023-09-16 NOTE — Addendum Note (Signed)
 Addended byVilma Meckel on: 09/16/2023 10:27 AM   Modules accepted: Orders

## 2023-09-17 NOTE — Telephone Encounter (Signed)
 I called and spoke with pt. I informed pt of Dr [employer] message. Pt stated she went to the pharmacy yesterday and picked the rx up but she paid $141 but there were 3 inhalers in the rx. Pt stated nothing else was needed. Completing note.

## 2023-09-26 ENCOUNTER — Ambulatory Visit: Payer: Medicare Other | Admitting: Pulmonary Disease

## 2023-09-26 ENCOUNTER — Encounter: Payer: Self-pay | Admitting: Pulmonary Disease

## 2023-09-26 VITALS — BP 139/84 | HR 66 | Ht 60.0 in | Wt 139.2 lb

## 2023-09-26 DIAGNOSIS — Z87891 Personal history of nicotine dependence: Secondary | ICD-10-CM

## 2023-09-26 DIAGNOSIS — J849 Interstitial pulmonary disease, unspecified: Secondary | ICD-10-CM | POA: Diagnosis not present

## 2023-09-26 DIAGNOSIS — J8410 Pulmonary fibrosis, unspecified: Secondary | ICD-10-CM | POA: Diagnosis not present

## 2023-09-26 NOTE — Patient Instructions (Signed)
 It is nice to see you again  I am glad you are doing well  The CT scan to me looks stable, no further worsening, likely the scarring is from the COVID infection in the past  I am sorry about the nausea with the Wixela, if this continues after a couple months, please send me message and I can prescribe the lower dose.  Return to clinic in 6 months or sooner as needed with Dr. Judeth Horn

## 2023-09-26 NOTE — Progress Notes (Signed)
 @Patient  ID: [name], female    DOB: 01-Apr-1946, 78 y.o.   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  Multiple PCP notes reviewed.  Most recent pulmonary note Kandice Robinsons, NP reviewed.  Overall doing well.  Breathing at baseline.  No significant dyspnea.  No significant cough etc.  Seen in interim by Kandice Robinsons, NP.  Repeat CT scan was ordered and obtained 07/2023.  This is personally reviewed interpreted as unchanged scattered fibrotic changes consistent with post inflammatory scarring likely related to COVID-pneumonia.  She is having some nausea with taking new inhaler, Wixela.  Previous on Breo but too expensive.  We discussed trying to stick with it to see if we can get through this side effect.  Notably the Wixela steroid dose is a little bit higher than Breo.  It could be the LABA or some other an active ingredient doing it as well.  If continues after the next couple months we will decrease to low-dose and try to find out.  HPI at initial visit: Patient onset of viral symptoms around Thanksgiving 2022.  Cough, congestion, sore throat.  Prompted chest x-ray 06/13/2020 with bilateral interstitial infiltrates on my interpretation.  She was prescribed antibiotics.  Repeat chest x-ray 07/21/2020 with persistent slightly worse bilateral infiltrates on my interpretation.  Again prescribed antibiotics.  Had chest x-ray 10/05/2020 which showed persistent infiltrates slightly improved.  Notably, in the interim in 07/2018 she testifies for Covid.  She had D-dimer performed the same day the chest x-ray given these dyspnea symptoms it was positive.  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  Cough is improved.  Still with dyspnea exertion.  Described as severe.  Worse with inclines or stairs.  No times a day were better or worse.  No seasonal or environmental changes.  No positional changes.  No alleviating or exacerbating factors.  PMH: Anxiety, GERD, breast cancer status post lumpectomy on tamoxifen therapy Surgical history: Lumpectomy Family history: Mother with melanoma, colon cancer, Social history: Former smoker, approximate 20-pack-year history, quit 20+ years ago around 2000  Questionaires / Pulmonary Flowsheets:   ACT:      No data to display          MMRC:     No data to display          Epworth:      No data to display          Tests:   FENO:  No results found for: "NITRICOXIDE"  PFT:     No data to display          WALK:      No data to display          Imaging: Reviewed as per EMR discussion this note No results found.   Lab Results: Reviewed and as per EMR CBC    Component Value Date/Time   WBC 7.6 04/08/2023 1143   WBC 7.2 10/08/2022 1304   RBC 4.03 04/08/2023 1143   HGB 13.9 04/08/2023 1143   HCT 41.1 04/08/2023  1143   PLT 114 (L) 04/08/2023 1143   MCV 102.0 (H) 04/08/2023 1143   MCH 34.5 (H) 04/08/2023 1143   MCHC 33.8 04/08/2023 1143   RDW 13.3 04/08/2023 1143   LYMPHSABS 2.5 04/08/2023 1143   MONOABS 0.6 04/08/2023 1143   EOSABS 0.1 04/08/2023 1143   BASOSABS 0.0 04/08/2023 1143    BMET    Component Value Date/Time   NA 139 04/08/2023 1143   NA 140 03/01/2023 1210   K 3.7 04/08/2023 1143   CL 106 04/08/2023 1143   CO2 29 04/08/2023 1143   GLUCOSE 96 04/08/2023 1143   GLUCOSE 131 (H) 06/19/2006 1135   BUN 12 04/08/2023 1143   BUN 13 03/01/2023 1210   CREATININE 0.81 04/08/2023 1143   CREATININE 0.80 04/27/2020 1056   CALCIUM 8.9 04/08/2023 1143    GFRNONAA >60 04/08/2023 1143   GFRAA >90 11/21/2012 0045    BNP No results found for: "BNP"  ProBNP No results found for: "PROBNP"  Specialty Problems       Pulmonary Problems   SOB (shortness of breath)   Allergies  Allergen Reactions   Codeine Other (See Comments)      chest and stomach pain, severe abd cramping   Hydrocodone     Whelts all over/swelling in lips   Sertraline Hcl Other (See Comments)     muscle aches, diarrhea, nausea    Immunization History  Administered Date(s) Administered   Fluad Quad(high Dose 65+) 04/28/2019, 04/27/2020   Influenza Whole 05/09/2007, 07/18/2009, 06/12/2010   Influenza, High Dose Seasonal PF 06/01/2015, 05/23/2017, 05/08/2018   Influenza, Seasonal, Injecte, Preservative Fre 08/01/2012   Influenza,inj,Quad PF,6+ Mos 04/07/2014   Pneumococcal Conjugate-13 12/03/2013   Pneumococcal Polysaccharide-23 07/18/2009   Tdap 12/20/2010   Zoster, Live 12/26/2011    Past Medical History:  Diagnosis Date   Anxiety and depression    Arthritis    Blood transfusion without reported diagnosis 1989   GB hemorrage    Cataract    Bil/no surgery yet.   Coccyx pain    Secondary to scar tissue from old pressure ulcer after back surgery, Dr. Luisa Hart   Coronary artery disease    Depression 2010   panic attacks and depression   Eczema    GERD (gastroesophageal reflux disease)    Goiter    Headache(784.0)    Hyperlipidemia    Hypertension    Partial tear of right rotator cuff    & labral tear   S/P cardiac cath 11/24/2014   Revealing nonobstructive CAD w/ tubular, discrete 40% mid lesion in the circumflex; discrete 30% proximal lesion in the LAD; luminal irregularities, 35% mid lesion in RCA; and generic 40% mid lesion in the RCA. Medical treatment recommended.   Stroke [facility] [place]) 2004   mild TIA    Tobacco History: Social History   Tobacco Use  Smoking Status Former   Current packs/day: 0.00   Average packs/day: 1 pack/day for 20.0 years  (20.0 ttl pk-yrs)   Types: Cigarettes   Start date: 81   Quit date: 2000   Years since quitting: 25.1  Smokeless Tobacco Never  Tobacco Comments   quit aprox 2000 after 30 years, 1 to 1.5 ppd   Counseling given: Not Answered Tobacco comments: quit aprox 2000 after 30 years, 1 to 1.5 ppd   Continue to not smoke  Outpatient Encounter Medications as of 09/26/2023  Medication Sig   acetaminophen (TYLENOL) 500 MG tablet Take 500 mg by mouth every 6 (six) hours as  needed.   alendronate (FOSAMAX) 70 MG tablet Take 1 tablet (70 mg total) by mouth once a week. Take with a full glass of water on an empty stomach.   ALPRAZolam (XANAX) 0.25 MG tablet Take 1-2 tablets (0.25-0.5 mg total) by mouth at bedtime as needed for anxiety or sleep.   amLODipine (NORVASC) 10 MG tablet Take 0.5 tablets (5 mg total) by mouth daily as needed.   aspirin 81 MG chewable tablet Chew by mouth daily.   atorvastatin (LIPITOR) 40 MG tablet Take 1 tablet (40 mg total) by mouth at bedtime.   calcium-vitamin D 250-100 MG-UNIT tablet Take 1 tablet by mouth 2 (two) times daily.   escitalopram (LEXAPRO) 20 MG tablet Take 0.5 tablets (10 mg total) by mouth daily.   famotidine (PEPCID) 20 MG tablet Take 1 tablet (20 mg total) by mouth 2 (two) times daily.   fluticasone-salmeterol (WIXELA INHUB) 250-50 MCG/ACT AEPB Inhale 1 puff into the lungs in the morning and at bedtime.   levothyroxine (SYNTHROID) 50 MCG tablet Take 1 tablet (50 mcg total) by mouth daily before breakfast.   tamoxifen (NOLVADEX) 20 MG tablet Take 1 tablet (20 mg total) by mouth daily.   No facility-administered encounter medications on file as of 09/26/2023.     Review of Systems  Review of Systems  N/a   Physical Exam  BP 139/84 (BP Location: Left Arm, Patient Position: Sitting, Cuff Size: Normal)   Pulse 66   Ht 5' (1.524 m)   Wt 139 lb 3.2 oz (63.1 kg)   SpO2 95%   BMI 27.19 kg/m   Wt Readings from Last 5 Encounters:  09/26/23 139 lb 3.2  oz (63.1 kg)  06/04/23 141 lb 6 oz (64.1 kg)  04/08/23 138 lb (62.6 kg)  03/01/23 137 lb 6.4 oz (62.3 kg)  02/19/23 137 lb 9.6 oz (62.4 kg)    BMI Readings from Last 5 Encounters:  09/26/23 27.19 kg/m  06/04/23 27.61 kg/m  04/08/23 26.95 kg/m  03/01/23 26.83 kg/m  02/19/23 26.00 kg/m     Physical Exam General: Well-appearing in no acute distress Eyes: EOMI, no icterus Neck: Supple no JVP Cardiovascular: Regular rhythm, no murmur Pulmonary: Clear rotation bilaterally no crackles Abdomen: Nondistended, bowel sounds present MSK: No synovitis, no joint effusion Neuro: No weakness, normal gait Psych: Normal mood, flat affect   Assessment & Plan:    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  No connective tissue diseases symptoms.  Will screen with serologic work-up.  Most likely irreversible.  Unlikely to worsen if related to viral process.  Still some time to hope for recovery or improvement given Covid + 07/2020.  Although I am suspicious that this initially started 05/2021 given abnormal chest x-ray and possible viral illness, possibly Covid at that time.  CT scan 01/2021 stable.  Repeat CT scan 07/2023 unchanged.  Pan scan for surveillance every 1 to 2 years or sooner as needed.  Possible asthma: Well-controlled with low-dose Breo in the past.  Changed to Wixela mid dose recently.  With nausea with administration.  Try to stick through.  If not improving will decrease to lower dose at that next couple months.  Possible higher dose of steroids doing this but this would be unusual.  It is also possible from an active ingredient or a different LABA causing this.  If that is the case we need to go back to a different inhaler if symptoms persist on lower dose steroid.  Return in about 6 months (around 03/28/2024) for f/u Dr. Judeth Horn.   Karren Burly, MD 09/26/2023

## 2023-10-04 ENCOUNTER — Ambulatory Visit
Admission: RE | Admit: 2023-10-04 | Discharge: 2023-10-04 | Disposition: A | Discharge status: Home / Self Care | Payer: Medicare Other | Source: Ambulatory Visit | Attending: Hematology | Admitting: Hematology

## 2023-10-04 DIAGNOSIS — Z1231 Encounter for screening mammogram for malignant neoplasm of breast: Secondary | ICD-10-CM | POA: Diagnosis not present

## 2023-10-06 ENCOUNTER — Other Ambulatory Visit: Payer: Self-pay | Admitting: Hematology

## 2023-10-06 ENCOUNTER — Other Ambulatory Visit: Payer: Self-pay | Admitting: Internal Medicine

## 2023-10-08 ENCOUNTER — Inpatient Hospital Stay: Payer: Medicare Other | Admitting: Nurse Practitioner

## 2023-10-08 ENCOUNTER — Encounter: Payer: Self-pay | Admitting: Nurse Practitioner

## 2023-10-08 ENCOUNTER — Inpatient Hospital Stay: Payer: Medicare Other | Attending: Nurse Practitioner

## 2023-10-08 VITALS — BP 136/76 | HR 79 | Temp 97.4°F | Resp 17 | Wt 137.4 lb

## 2023-10-08 DIAGNOSIS — Z803 Family history of malignant neoplasm of breast: Secondary | ICD-10-CM | POA: Insufficient documentation

## 2023-10-08 DIAGNOSIS — Z1732 Human epidermal growth factor receptor 2 negative status: Secondary | ICD-10-CM | POA: Diagnosis not present

## 2023-10-08 DIAGNOSIS — F419 Anxiety disorder, unspecified: Secondary | ICD-10-CM | POA: Insufficient documentation

## 2023-10-08 DIAGNOSIS — I2510 Atherosclerotic heart disease of native coronary artery without angina pectoris: Secondary | ICD-10-CM | POA: Diagnosis not present

## 2023-10-08 DIAGNOSIS — Z8616 Personal history of COVID-19: Secondary | ICD-10-CM | POA: Diagnosis not present

## 2023-10-08 DIAGNOSIS — R252 Cramp and spasm: Secondary | ICD-10-CM | POA: Insufficient documentation

## 2023-10-08 DIAGNOSIS — L409 Psoriasis, unspecified: Secondary | ICD-10-CM | POA: Insufficient documentation

## 2023-10-08 DIAGNOSIS — Z90710 Acquired absence of both cervix and uterus: Secondary | ICD-10-CM | POA: Insufficient documentation

## 2023-10-08 DIAGNOSIS — G8929 Other chronic pain: Secondary | ICD-10-CM | POA: Diagnosis not present

## 2023-10-08 DIAGNOSIS — Z8673 Personal history of transient ischemic attack (TIA), and cerebral infarction without residual deficits: Secondary | ICD-10-CM | POA: Diagnosis not present

## 2023-10-08 DIAGNOSIS — Z170 Estrogen receptor positive status [ER+]: Secondary | ICD-10-CM | POA: Diagnosis not present

## 2023-10-08 DIAGNOSIS — J849 Interstitial pulmonary disease, unspecified: Secondary | ICD-10-CM | POA: Insufficient documentation

## 2023-10-08 DIAGNOSIS — C50412 Malignant neoplasm of upper-outer quadrant of left female breast: Secondary | ICD-10-CM | POA: Diagnosis not present

## 2023-10-08 DIAGNOSIS — Z79899 Other long term (current) drug therapy: Secondary | ICD-10-CM | POA: Insufficient documentation

## 2023-10-08 DIAGNOSIS — E785 Hyperlipidemia, unspecified: Secondary | ICD-10-CM | POA: Insufficient documentation

## 2023-10-08 DIAGNOSIS — Z8041 Family history of malignant neoplasm of ovary: Secondary | ICD-10-CM | POA: Diagnosis not present

## 2023-10-08 DIAGNOSIS — K219 Gastro-esophageal reflux disease without esophagitis: Secondary | ICD-10-CM | POA: Diagnosis not present

## 2023-10-08 DIAGNOSIS — D696 Thrombocytopenia, unspecified: Secondary | ICD-10-CM | POA: Insufficient documentation

## 2023-10-08 DIAGNOSIS — F32A Depression, unspecified: Secondary | ICD-10-CM | POA: Diagnosis not present

## 2023-10-08 DIAGNOSIS — Z9049 Acquired absence of other specified parts of digestive tract: Secondary | ICD-10-CM | POA: Diagnosis not present

## 2023-10-08 DIAGNOSIS — I10 Essential (primary) hypertension: Secondary | ICD-10-CM | POA: Diagnosis not present

## 2023-10-08 DIAGNOSIS — Z1721 Progesterone receptor positive status: Secondary | ICD-10-CM | POA: Insufficient documentation

## 2023-10-08 LAB — CMP (CANCER CENTER ONLY)
ALT: 6 U/L (ref 0–44)
AST: 12 U/L — ABNORMAL LOW (ref 15–41)
Albumin: 3.7 g/dL (ref 3.5–5.0)
Alkaline Phosphatase: 52 U/L (ref 38–126)
Anion gap: 4 — ABNORMAL LOW (ref 5–15)
BUN: 13 mg/dL (ref 8–23)
CO2: 29 mmol/L (ref 22–32)
Calcium: 8.9 mg/dL (ref 8.9–10.3)
Chloride: 106 mmol/L (ref 98–111)
Creatinine: 0.8 mg/dL (ref 0.44–1.00)
GFR, Estimated: >60 mL/min (ref >60)
Glucose, Bld: 118 mg/dL — ABNORMAL HIGH (ref 70–99)
Potassium: 3.8 mmol/L (ref 3.5–5.1)
Sodium: 139 mmol/L (ref 135–145)
Total Bilirubin: 0.4 mg/dL (ref 0.0–1.2)
Total Protein: 6.1 g/dL — ABNORMAL LOW (ref 6.5–8.1)

## 2023-10-08 LAB — CBC WITH DIFFERENTIAL (CANCER CENTER ONLY)
Abs Immature Granulocytes: 0.02 10*3/uL (ref 0.00–0.07)
Basophils Absolute: 0 10*3/uL (ref 0.0–0.1)
Basophils Relative: 0 %
Eosinophils Absolute: 0.1 10*3/uL (ref 0.0–0.5)
Eosinophils Relative: 1 %
HCT: 40.8 % (ref 36.0–46.0)
Hemoglobin: 13.4 g/dL (ref 12.0–15.0)
Immature Granulocytes: 0 %
Lymphocytes Relative: 34 %
Lymphs Abs: 3 10*3/uL (ref 0.7–4.0)
MCH: 31.5 pg (ref 26.0–34.0)
MCHC: 32.8 g/dL (ref 30.0–36.0)
MCV: 95.8 fL (ref 80.0–100.0)
Monocytes Absolute: 0.6 10*3/uL (ref 0.1–1.0)
Monocytes Relative: 7 %
Neutro Abs: 5.2 10*3/uL (ref 1.7–7.7)
Neutrophils Relative %: 58 %
Platelet Count: 115 10*3/uL — ABNORMAL LOW (ref 150–400)
RBC: 4.26 MIL/uL (ref 3.87–5.11)
RDW: 13 % (ref 11.5–15.5)
WBC Count: 8.9 10*3/uL (ref 4.0–10.5)
nRBC: 0 % (ref 0.0–0.2)

## 2023-10-08 NOTE — Progress Notes (Signed)
 Patient Care Team: Wanda Plump, MD as PCP - General [name], Dimas Aguas, MD as Consulting Physician (Gynecology) Meryl Dare, MD (Inactive) as Consulting Physician (Gastroenterology) Harriette Bouillon, MD as Consulting Physician (General Surgery) Delfin Gant, MD as Attending Physician (Orthopedic Surgery) Beverely Low, MD as Consulting Physician (Orthopedic Surgery) Mitchel Honour, DO as Consulting Physician (Obstetrics and Gynecology) Pershing Proud, RN as Oncology Nurse Navigator Donnelly Angelica, RN as Oncology Nurse Navigator Almond Lint, MD as Consulting Physician (General Surgery) Malachy Mood, MD as Consulting Physician (Hematology) Lonie Peak, MD as Attending Physician (Radiation Oncology) Erroll Luna, [facility] (Inactive) (Pharmacist) Erroll Luna, [facility] (Inactive) (Pharmacist) Pollyann Samples, NP as Nurse Practitioner (Nurse Practitioner)   CHIEF COMPLAINT: Follow up left breast cancer   Oncology History Overview Note  Cancer Staging Malignant neoplasm of upper-outer quadrant of left breast in female, estrogen receptor positive (HCC) Staging form: Breast, AJCC 8th Edition - Clinical stage from 07/26/2020: Stage IA (cT1b, cN0, cM0, G2, ER+, PR+, HER2-) - Signed by Malachy Mood, MD on 08/09/2020    Malignant neoplasm of upper-outer quadrant of left breast in female, estrogen receptor positive (HCC)  07/04/2020 Mammogram   IMPRESSION: Indeterminate 4 x 8 x 5 mm irregular hypoechoic mass left breast 1 o'clock position 4 cm from the nipple.   07/26/2020 Cancer Staging   Staging form: Breast, AJCC 8th Edition - Clinical stage from 07/26/2020: Stage IA (cT1b, cN0, cM0, G2, ER+, PR+, HER2-) - Signed by Malachy Mood, MD on 08/09/2020   07/26/2020 Initial Biopsy   Diagnosis Breast, left, needle core biopsy, upper outer 1 o'clock - INVASIVE DUCTAL CARCINOMA - SEE COMMENT Microscopic Comment Based on the biopsy, the carcinoma appears Nottingham grade 2 of 3 and measures 0.7 cm  in greatest linear extent. Prognostic markers (ER/PR/ki-67/HER2) are pending and will be reported in an addendum. Dr. Rayetta Pigg reviewed the case and agrees with the above diagnosis. These results were called to [facility] on July 27, 2018.   07/26/2020 Receptors her2   PROGNOSTIC INDICATORS Results: IMMUNOHISTOCHEMICAL AND MORPHOMETRIC ANALYSIS PERFORMED MANUALLY The tumor cells are NEGATIVE for Her2 (1+). Estrogen Receptor: 90%, POSITIVE, STRONG STAINING INTENSITY Progesterone Receptor: 80%, POSITIVE, STRONG STAINING INTENSITY Proliferation Marker Ki67: 2%   08/03/2020 Initial Diagnosis   Malignant neoplasm of upper-outer quadrant of left breast in female, estrogen receptor positive (HCC)   09/08/2020 Surgery   LEFT BREAST LUMPECTOMY WITH RADIOACTIVE SEED LOCALIZATION by Dr Donell Beers   09/08/2020 Pathology Results   FINAL MICROSCOPIC DIAGNOSIS:   A. 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  - Margins not involved.  - Invasive carcinoma 0.1 cm from anterior margin.  - Biopsy site and biopsy clip.  - Fibrocystic changes.  - See oncology table.     09/2020 -  Anti-estrogen oral therapy   Tamoxifen 20mg  daily starting 09/2020   12/27/2020 Cancer Staging   Staging form: Breast, AJCC 8th Edition - Pathologic: Stage Unknown (pT1b, pNX, cM0, G1, ER+, PR+, HER2-) - Signed by Pollyann Samples, NP on 12/27/2020 Histologic grading system: 3 grade system   12/29/2020 Survivorship   SCP delivered by Santiago Glad, NP    01/17/2021 Genetic Testing   Negative genetic testing:  No pathogenic variants detected on the [employer] CancerNext-Expanded + RNAinsight panel. The report date is 01/17/2021.   The CancerNext-Expanded + RNAinsight gene panel offered by [employer] and includes sequencing and rearrangement analysis for the following 77 genes: AIP, ALK, APC, ATM,  AXIN2, BAP1, BARD1, BLM, BMPR1A, BRCA1, BRCA2, BRIP1, CDC73, CDH1, CDK4, CDKN1B, CDKN2A, CHEK2,  CTNNA1, DICER1, FANCC, FH, FLCN, GALNT12, KIF1B, LZTR1, MAX, MEN1, MET, MLH1, MSH2, MSH3, MSH6, MUTYH, NBN, NF1, NF2, NTHL1, PALB2, PHOX2B, PMS2, POT1, PRKAR1A, PTCH1, PTEN, RAD51C, RAD51D, RB1, RECQL, RET, SDHA, SDHAF2, SDHB, SDHC, SDHD, SMAD4, SMARCA4, SMARCB1, SMARCE1, STK11, SUFU, TMEM127, TP53, TSC1, TSC2, VHL and XRCC2 (sequencing and deletion/duplication); EGFR, EGLN1, HOXB13, KIT, MITF, PDGFRA, POLD1 and POLE (sequencing only); EPCAM and GREM1 (deletion/duplication only). RNA data is routinely analyzed for use in variant interpretation for all genes.      CURRENT THERAPY: Tamoxifen 20 mg daily, starting 09/2020  INTERVAL HISTORY [name] returns for follow up as scheduled.  Last seen by [place] 6 months ago, denies changes in her health.  She is tolerating tamoxifen without issues.  She feels the Fosamax has contributed to leg and muscle cramps and blisters erupting on her palms and legs as well as frequent bowel movements.  She would like to stop it.  Had a recent mammogram, denies any breast concerns or changes such as new lump/mass, nipple discharge or inversion, or skin change.  ROS  All other systems reviewed and negative  Past Medical History:  Diagnosis Date   Anxiety and depression    Arthritis    Blood transfusion without reported diagnosis 1989   GB hemorrage    Cataract    Bil/no surgery yet.   Coccyx pain    Secondary to scar tissue from old pressure ulcer after back surgery, Dr. Luisa Hart   Coronary artery disease    Depression 2010   panic attacks and depression   Eczema    GERD (gastroesophageal reflux disease)    Goiter    Headache(784.0)    Hyperlipidemia    Hypertension    Partial tear of right rotator cuff    & labral tear   S/P cardiac cath 11/24/2014   Revealing nonobstructive CAD w/ tubular, discrete 40% mid lesion in the circumflex; discrete 30% proximal lesion in the LAD; luminal irregularities, 35% mid lesion in RCA; and generic 40% mid lesion in the  RCA. Medical treatment recommended.   Stroke [facility]) 2004   mild TIA     Past Surgical History:  Procedure Laterality Date   ABDOMINAL HYSTERECTOMY     oophorectomy L only   BREAST CYST ASPIRATION Left    BREAST CYST EXCISION Left    BREAST LUMPECTOMY     BREAST LUMPECTOMY WITH RADIOACTIVE SEED LOCALIZATION Left 09/08/2020   Procedure: LEFT BREAST LUMPECTOMY WITH RADIOACTIVE SEED LOCALIZATION;  Surgeon: Almond Lint, MD;  Location: [facility];  Service: General;  Laterality: Left;   BUNIONECTOMY  1990s   LEFT   CHOLECYSTECTOMY     HAND SURGERY  1990s   R hand x 2, L hand x 1   LUMBAR LAMINECTOMY/DECOMPRESSION MICRODISCECTOMY N/A 11/13/2012   Procedure: MICRO LUMBAR DECOMPRESSION L4 - L5 AND L2 - L3 2 LEVELS;  Surgeon: Javier Docker, MD;  Location: [facility];  Service: Orthopedics;  Laterality: N/A;   SHOULDER SURGERY  2006   left      Outpatient Encounter Medications as of 10/08/2023  Medication Sig   acetaminophen (TYLENOL) 500 MG tablet Take 500 mg by mouth every 6 (six) hours as needed.   ALPRAZolam (XANAX) 0.25 MG tablet Take 1-2 tablets (0.25-0.5 mg total) by mouth at bedtime as needed for anxiety or sleep.   amLODipine (NORVASC) 10 MG tablet Take 1 tablet (10 mg total) by  mouth daily.   aspirin 81 MG chewable tablet Chew by mouth daily.   atorvastatin (LIPITOR) 40 MG tablet Take 1 tablet (40 mg total) by mouth at bedtime.   calcium-vitamin D 250-100 MG-UNIT tablet Take 1 tablet by mouth 2 (two) times daily.   escitalopram (LEXAPRO) 20 MG tablet Take 0.5 tablets (10 mg total) by mouth daily.   famotidine (PEPCID) 20 MG tablet Take 1 tablet (20 mg total) by mouth 2 (two) times daily.   fluticasone-salmeterol (WIXELA INHUB) 250-50 MCG/ACT AEPB Inhale 1 puff into the lungs in the morning and at bedtime.   levothyroxine (SYNTHROID) 50 MCG tablet Take 1 tablet (50 mcg total) by mouth daily before breakfast.   tamoxifen (NOLVADEX) 20 MG tablet TAKE 1 TABLET(20 MG) BY  MOUTH DAILY   [DISCONTINUED] alendronate (FOSAMAX) 70 MG tablet Take 1 tablet (70 mg total) by mouth once a week. Take with a full glass of water on an empty stomach.   No facility-administered encounter medications on file as of 10/08/2023.     Today's Vitals   10/08/23 1138  BP: 136/76  Pulse: 79  Resp: 17  Temp: (!) 97.4 F (36.3 C)  SpO2: 95%  Weight: 137 lb 6.4 oz (62.3 kg)  PainSc: 0-No pain   Body mass index is 26.83 kg/m.   PHYSICAL EXAM GENERAL:alert, no distress and comfortable SKIN: no rash  EYES: sclera clear LUNGS:  normal breathing effort HEART:  no lower extremity edema NEURO: alert & oriented x 3 with fluent speech, no focal motor/sensory deficits Breast exam: Patient declined    CBC    Component Value Date/Time   WBC 8.9 10/08/2023 1114   WBC 7.2 10/08/2022 1304   RBC 4.26 10/08/2023 1114   HGB 13.4 10/08/2023 1114   HCT 40.8 10/08/2023 1114   PLT 115 (L) 10/08/2023 1114   MCV 95.8 10/08/2023 1114   MCH 31.5 10/08/2023 1114   MCHC 32.8 10/08/2023 1114   RDW 13.0 10/08/2023 1114   LYMPHSABS 3.0 10/08/2023 1114   MONOABS 0.6 10/08/2023 1114   EOSABS 0.1 10/08/2023 1114   BASOSABS 0.0 10/08/2023 1114     CMP     Component Value Date/Time   NA 139 10/08/2023 1114   NA 140 03/01/2023 1210   K 3.8 10/08/2023 1114   CL 106 10/08/2023 1114   CO2 29 10/08/2023 1114   GLUCOSE 118 (H) 10/08/2023 1114   GLUCOSE 131 (H) 06/19/2006 1135   BUN 13 10/08/2023 1114   BUN 13 03/01/2023 1210   CREATININE 0.80 10/08/2023 1114   CREATININE 0.80 04/27/2020 1056   CALCIUM 8.9 10/08/2023 1114   PROT 6.1 (L) 10/08/2023 1114   ALBUMIN 3.7 10/08/2023 1114   AST 12 (L) 10/08/2023 1114   ALT 6 10/08/2023 1114   ALKPHOS 52 10/08/2023 1114   BILITOT 0.4 10/08/2023 1114   GFRNONAA >60 10/08/2023 1114   GFRAA >90 11/21/2012 0045     ASSESSMENT & PLAN:[name] is a 78 y.o. female with    1. Malignant neoplasm of upper-outer quadrant of left breast,  Stage IA, c(T1bN0M0), ER+/PR+/HER2-, Grade II -Diagnosed in 07/2020, s/p left breast lumpectomy on 09/08/20 with Dr Donell Beers. Her Surgical path showed 0.7cm of invasive ductal carcinoma which was completely resected with negative margins, Grade 1.  Given her small tumor size, Oncotype was not recommended and patient opted to forego adjuvant radiation -She started tamoxifen in 09/2020. She is tolerating well with minimal side effects. -Ms. Harlon Flor is clinically doing well.  Tolerating tamoxifen, labs are unremarkable, mammo 10/04/23 is benign.  Declined breast exam -Overall no clinical concern for recurrence.  Continue breast cancer surveillance and tamoxifen -Follow-up in 6 months, or sooner if needed   2.  Nausea and dizzy spells -For past couple months she has been having nausea and dizzy spells -BP lower, on amlodipine -Felt to be medication related and improved   3.  Thrombocytopenia  -Since 07/2020 platelet count has trended down.  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 comorbidities: HTN, HLD, GERD, Anxiety, Chronic back pain (manageable)  -Continue med regimen and PCP follow-up -Lexapro is helping her anxiety/mood.   5. Genetics -Genetic testing was recommend given FMHx of breast cancer and Ovarian cancer: Patient seen by Irving Kacen Mellinger on 08/10/20 and previously declined testing -Results were negative.   6. Covid-19+ 08/20/20 -has residual issues with breathing. -high resolution chest CT on 01/20/21 showed interstitial lung disease, with a spectrum of findings considered most compatible with chronic hypersensitivity pneumonitis. -Continue Pulm follow-up   7. Osteopenia  -Her 06/2019 DEXA shows osteopenia at forearm radius with T-score -1.6 -DEXA 10/03/2022 worsened to osteoporosis -2.6 -Previously  reviewed the bone strengthening quality of tamoxifen -Prescribed Fosamax 03/2023, she is experiencing leg/muscle cramps, blisters, and frequent bowel movements.  She would like to stop -Discussed injection/Zometa infusions again, patient declined -Continue calcium, vitamin D, and weightbearing exercise    PLAN: -Recent mammogram and today's labs reviewed -Continue breast cancer surveillance and Tamoxifen -Stop Fosamax due to SEs, continue calcium/vit D and weight bearing exercise -F/up in 6 months, or sooner if needed   All questions were answered. The patient knows to call the clinic with any problems, questions or concerns. No barriers to learning were detected.   Santiago Glad, NP-C 10/08/2023

## 2023-10-09 ENCOUNTER — Ambulatory Visit (INDEPENDENT_AMBULATORY_CARE_PROVIDER_SITE_OTHER): Payer: Medicare Other

## 2023-10-09 DIAGNOSIS — E538 Deficiency of other specified B group vitamins: Secondary | ICD-10-CM | POA: Diagnosis not present

## 2023-10-09 MED ORDER — CYANOCOBALAMIN 1000 MCG/ML IJ SOLN
1000.0000 ug | Freq: Once | INTRAMUSCULAR | Status: AC
Start: 2023-10-09 — End: 2023-10-09
  Administered 2023-10-09: 1000 ug via INTRAMUSCULAR

## 2023-10-09 NOTE — Progress Notes (Signed)
 Pt here for monthly B12 injection per PCP  B12 given IM L deltoid, and pt tolerated injection well.  Next B12 injection scheduled for 11/07/2023.

## 2023-10-11 ENCOUNTER — Other Ambulatory Visit: Payer: Self-pay | Admitting: Hematology

## 2023-10-29 ENCOUNTER — Ambulatory Visit (INDEPENDENT_AMBULATORY_CARE_PROVIDER_SITE_OTHER): Payer: Medicare Other

## 2023-10-29 VITALS — Ht 60.0 in | Wt 137.0 lb

## 2023-10-29 DIAGNOSIS — Z0000 Encounter for general adult medical examination without abnormal findings: Secondary | ICD-10-CM

## 2023-10-29 NOTE — Patient Instructions (Addendum)
 [name] , Thank you for taking time to come for your Medicare Wellness Visit. I appreciate your ongoing commitment to your health goals. Please review the following plan we discussed and let me know if I can assist you in the future.   Referrals/Orders/Follow-Ups/Clinician Recommendations:   This is a list of the screening recommended for you and due dates:  Health Maintenance  Topic Date Due   DTaP/Tdap/Td vaccine (2 - Td or Tdap) 12/19/2020   Flu Shot  02/21/2024   Mammogram  10/03/2024   Medicare Annual Wellness Visit  10/28/2024   Pneumonia Vaccine  Completed   DEXA scan (bone density measurement)  Completed   Hepatitis C Screening  Completed   HPV Vaccine  Aged Out   COVID-19 Vaccine  Discontinued   Cologuard (Stool DNA test)  Discontinued   Zoster (Shingles) Vaccine  Discontinued    Advanced directives: (Copy Requested) Please bring a copy of your health care power of attorney and living will to the office to be added to your chart at your convenience. You can mail to [facility] [address] [place], [place] [address] or email to [email or web address]  Next Medicare Annual Wellness Visit scheduled for next year: Yes

## 2023-10-29 NOTE — Progress Notes (Signed)
 Subjective:   [name] is a 78 y.o. who presents for a Medicare Wellness preventive visit.  Visit Complete: Virtual I connected with  [name] on 10/29/23 by a audio enabled telemedicine application and verified that I am speaking with the correct person using two identifiers.  Patient Location: Home  Provider Location: Home Office  I discussed the limitations of evaluation and management by telemedicine. The patient expressed understanding and agreed to proceed.  Vital Signs: Because this visit was a virtual/telehealth visit, some criteria may be missing or patient reported. Any vitals not documented were not able to be obtained and vitals that have been documented are patient reported.    Persons Participating in Visit: Patient.  AWV Questionnaire: No: Patient Medicare AWV questionnaire was not completed prior to this visit.  Cardiac Risk Factors include: advanced age (>64men, >1 women);dyslipidemia;hypertension     Objective:    Today's Vitals   10/29/23 0865 10/29/23 0939  Weight: 137 lb (62.1 kg)   Height: 5' (1.524 m)   PainSc:  0-No pain   Body mass index is 26.76 kg/m.     10/29/2023    9:47 AM 10/22/2022   11:02 AM 10/17/2021    1:07 PM 12/29/2020   12:35 PM 09/08/2020   11:40 AM 08/18/2020    1:09 PM 08/10/2020    4:49 PM  Advanced Directives  Does Patient Have a Medical Advance Directive? Yes No Yes No Yes Yes No  Type of Estate agent of [place];Living will    Healthcare Power of [employer] of [place];Living will   Does patient want to make changes to medical advance directive?   Yes (MAU/Ambulatory/Procedural Areas - Information given) No - Patient declined No - Patient declined No - Patient declined   Copy of Healthcare Power of Attorney in Chart? No - copy requested    No - copy requested    Would patient like information on creating a medical advance directive?  No - Patient declined  No - Patient  declined       Current Medications (verified) Outpatient Encounter Medications as of 10/29/2023  Medication Sig   acetaminophen (TYLENOL) 500 MG tablet Take 500 mg by mouth every 6 (six) hours as needed.   ALPRAZolam (XANAX) 0.25 MG tablet Take 1-2 tablets (0.25-0.5 mg total) by mouth at bedtime as needed for anxiety or sleep.   amLODipine (NORVASC) 10 MG tablet Take 1 tablet (10 mg total) by mouth daily.   aspirin 81 MG chewable tablet Chew by mouth daily.   atorvastatin (LIPITOR) 40 MG tablet Take 1 tablet (40 mg total) by mouth at bedtime.   calcium-vitamin D 250-100 MG-UNIT tablet Take 1 tablet by mouth 2 (two) times daily.   escitalopram (LEXAPRO) 20 MG tablet Take 0.5 tablets (10 mg total) by mouth daily.   famotidine (PEPCID) 20 MG tablet Take 1 tablet (20 mg total) by mouth 2 (two) times daily.   fluticasone-salmeterol (WIXELA INHUB) 250-50 MCG/ACT AEPB Inhale 1 puff into the lungs in the morning and at bedtime.   levothyroxine (SYNTHROID) 50 MCG tablet Take 1 tablet (50 mcg total) by mouth daily before breakfast.   tamoxifen (NOLVADEX) 20 MG tablet TAKE 1 TABLET(20 MG) BY MOUTH DAILY   No facility-administered encounter medications on file as of 10/29/2023.    Allergies (verified) Codeine, Hydrocodone, and Sertraline hcl   History: Past Medical History:  Diagnosis Date   Anxiety and depression    Arthritis    Blood  transfusion without reported diagnosis 1989   GB hemorrage    Cataract    Bil/no surgery yet.   Coccyx pain    Secondary to scar tissue from old pressure ulcer after back surgery, Dr. Luisa Hart   Coronary artery disease    Depression 2010   panic attacks and depression   Eczema    GERD (gastroesophageal reflux disease)    Goiter    Headache(784.0)    Hyperlipidemia    Hypertension    Partial tear of right rotator cuff    & labral tear   S/P cardiac cath 11/24/2014   Revealing nonobstructive CAD w/ tubular, discrete 40% mid lesion in the circumflex;  discrete 30% proximal lesion in the LAD; luminal irregularities, 35% mid lesion in RCA; and generic 40% mid lesion in the RCA. Medical treatment recommended.   Stroke [facility]) 2004   mild TIA   Past Surgical History:  Procedure Laterality Date   ABDOMINAL HYSTERECTOMY     oophorectomy L only   BREAST CYST ASPIRATION Left    BREAST CYST EXCISION Left    BREAST LUMPECTOMY     BREAST LUMPECTOMY WITH RADIOACTIVE SEED LOCALIZATION Left 09/08/2020   Procedure: LEFT BREAST LUMPECTOMY WITH RADIOACTIVE SEED LOCALIZATION;  Surgeon: Almond Lint, MD;  Location: [facility];  Service: General;  Laterality: Left;   BUNIONECTOMY  1990s   LEFT   CHOLECYSTECTOMY     HAND SURGERY  1990s   R hand x 2, L hand x 1   LUMBAR LAMINECTOMY/DECOMPRESSION MICRODISCECTOMY N/A 11/13/2012   Procedure: MICRO LUMBAR DECOMPRESSION L4 - L5 AND L2 - L3 2 LEVELS;  Surgeon: Javier Docker, MD;  Location: [facility];  Service: Orthopedics;  Laterality: N/A;   SHOULDER SURGERY  2006   left    Family History  Problem Relation Age of Onset   Lung cancer Mother        dx late 71s, cousin   Melanoma Mother        multiple, first diagnosed 58s   CAD Mother 90   Cataracts Father    Lung cancer Brother    Throat cancer Brother 36   Cancer Maternal Grandfather        NOS, dx 30s   Kidney cancer Maternal Aunt 70   Stomach cancer Maternal Uncle    Breast cancer Cousin        dx >50, maternal first cousin   Breast cancer Cousin        dx >50, maternal first cousin   Heart attack Half-Brother        2 brother s, CABG   Heart disease Half-Brother    Heart disease Half-Brother    Throat cancer Other        cousin   Colon cancer Neg Hx    Diabetes Neg Hx    Social History   Socioeconomic History   Marital status: Widowed    Spouse name: Not on file   Number of children: 4   Years of education: Not on file   Highest education level: Not on file  Occupational History   Occupation: retired     Associate Professor:  RETIRED  Tobacco Use   Smoking status: Former    Current packs/day: 0.00    Average packs/day: 1 pack/day for 20.0 years (20.0 ttl pk-yrs)    Types: Cigarettes    Start date: 28    Quit date: 2000    Years since quitting: 25.2   Smokeless tobacco: Never  Tobacco comments:    quit aprox 2000 after 30 years, 1 to 1.5 ppd  Substance and Sexual Activity   Alcohol use: No   Drug use: No   Sexual activity: Not Currently  Other Topics Concern   Not on file  Social History Narrative   Lost husband Merlyn Albert) 12/2018; 2 daughters. 2 sons    Youngest son lives w/her    Social Drivers of Health   Financial Resource Strain: Low Risk  (10/29/2023)   Overall Financial Resource Strain (CARDIA)    Difficulty of Paying Living Expenses: Not hard at all  Food Insecurity: No Food Insecurity (10/29/2023)   Hunger Vital Sign    Worried About Running Out of Food in the Last Year: Never true    Ran Out of Food in the Last Year: Never true  Transportation Needs: No Transportation Needs (10/29/2023)   PRAPARE - Administrator, Civil Service (Medical): No    Lack of Transportation (Non-Medical): No  Physical Activity: Sufficiently Active (10/29/2023)   Exercise Vital Sign    Days of Exercise per Week: 5 days    Minutes of Exercise per Session: 30 min  Stress: No Stress Concern Present (10/29/2023)   [employer] of Occupational Health - Occupational Stress Questionnaire    Feeling of Stress : Not at all  Social Connections: Moderately Integrated (10/29/2023)   Social Connection and Isolation Panel [NHANES]    Frequency of Communication with Friends and Family: More than three times a week    Frequency of Social Gatherings with Friends and Family: More than three times a week    Attends Religious Services: More than 4 times per year    Active Member of [employer] or Organizations: Yes    Attends Banker Meetings: More than 4 times per year    Marital Status: Widowed    Tobacco  Counseling Counseling given: Not Answered Tobacco comments: quit aprox 2000 after 30 years, 1 to 1.5 ppd    Clinical Intake:  Pre-visit preparation completed: Yes  Pain : No/denies pain Pain Score: 0-No pain     BMI - recorded: 26.76 Nutritional Status: BMI 25 -29 Overweight Nutritional Risks: None Diabetes: No  Lab Results  Component Value Date   HGBA1C 5.5 12/07/2022   HGBA1C 5.4 11/23/2021   HGBA1C 5.6 02/01/2021     How often do you need to have someone help you when you read instructions, pamphlets, or other written materials from your doctor or pharmacy?: 1 - Never  Interpreter Needed?: No  Information entered by :: Theresa Mulligan LPN   Activities of Daily Living     10/29/2023    9:44 AM  In your present state of health, do you have any difficulty performing the following activities:  Hearing? 1  Comment Wears Hearing Aids  Vision? 0  Difficulty concentrating or making decisions? 0  Walking or climbing stairs? 0  Dressing or bathing? 0  Doing errands, shopping? 0  Preparing Food and eating ? N  Using the Toilet? N  In the past six months, have you accidently leaked urine? Y  Comment Wears Panty Liners. Followed by PCP  Do you have problems with loss of bowel control? N  Managing your Medications? N  Managing your Finances? N  Housekeeping or managing your Housekeeping? N    Patient Care Team: Wanda Plump, MD as PCP - General [name], Dimas Aguas, MD as Consulting Physician (Gynecology) Meryl Dare, MD (Inactive) as Consulting Physician (Gastroenterology) [place], Maisie Fus,  MD as Consulting Physician (General Surgery) Delfin Gant, MD as Attending Physician (Orthopedic Surgery) Beverely Low, MD as Consulting Physician (Orthopedic Surgery) Mitchel Honour, DO as Consulting Physician (Obstetrics and Gynecology) Pershing Proud, RN as Oncology Nurse Navigator Donnelly Angelica, RN as Oncology Nurse Navigator Almond Lint, MD as Consulting Physician  (General Surgery) Malachy Mood, MD as Consulting Physician (Hematology) Lonie Peak, MD as Attending Physician (Radiation Oncology) Erroll Luna, [facility] (Inactive) (Pharmacist) Erroll Luna, [facility] (Inactive) (Pharmacist) Pollyann Samples, NP as Nurse Practitioner (Nurse Practitioner)  Indicate any recent Medical Services you may have received from other than [facility] providers in the past year (date may be approximate).     Assessment:   This is a routine wellness examination for Saesha.  Hearing/Vision screen Hearing Screening - Comments:: Wears Hearing Aids Vision Screening - Comments:: Wears rx glasses - up to date with routine eye exams with  [facility]   Goals Addressed               This Visit's Progress     Increase physical activity (pt-stated)        Remain active.       Depression Screen      10/29/2023    9:43 AM 06/04/2023    2:15 PM 12/07/2022   12:58 PM 10/22/2022   11:05 AM 08/07/2022    2:28 PM 11/23/2021   10:22 AM 10/17/2021    1:06 PM  PHQ 2/9 Scores  PHQ - 2 Score 0 0 0 0 1 2 2   PHQ- 9 Score  1 3  4 3      Fall Risk      10/29/2023    9:46 AM 09/26/2023    1:08 PM 06/04/2023    1:34 PM 12/07/2022   12:58 PM 10/22/2022   11:03 AM  Fall Risk   Falls in the past year? 0 0 0 0 0  Number falls in past yr: 0  0 0 0  Injury with Fall? 0  0 0 0  Risk for fall due to : No Fall Risks    No Fall Risks  Follow up Falls prevention discussed;Falls evaluation completed  Falls evaluation completed Falls evaluation completed Falls evaluation completed    MEDICARE RISK AT HOME:   Medicare Risk at Home Any stairs in or around the home?: Yes If so, are there any without handrails?: No Home free of loose throw rugs in walkways, pet beds, electrical cords, etc?: Yes Adequate lighting in your home to reduce risk of falls?: Yes Life alert?: No Use of a cane, walker or w/c?: No Grab bars in the bathroom?: Yes Shower chair or bench in shower?: Yes Elevated toilet  seat or a handicapped toilet?: Yes  TIMED UP AND GO:  Was the test performed?  No  Cognitive Function: 6CIT completed    10/25/2017   10:49 AM  MMSE - Mini Mental State Exam  Orientation to time 5  Orientation to Place 5  Registration 3  Attention/ Calculation 5  Recall 3  Language- name 2 objects 2  Language- repeat 1  Language- follow 3 step command 3  Language- read & follow direction 1  Write a sentence 1  Copy design 1  Total score 30        10/29/2023    9:48 AM 10/22/2022   11:11 AM 10/17/2021    1:11 PM  6CIT Screen  What Year? 0 points 0 points 0 points  What month?  0 points 0 points 0 points  What time? 0 points 0 points 0 points  Count back from 20 0 points 0 points 0 points  Months in reverse 4 points 0 points 4 points  Repeat phrase 2 points 0 points 0 points  Total Score 6 points 0 points 4 points    Immunizations Immunization History  Administered Date(s) Administered   Fluad Quad(high Dose 65+) 04/28/2019, 04/27/2020   Influenza Whole 05/09/2007, 07/18/2009, 06/12/2010   Influenza, High Dose Seasonal PF 06/01/2015, 05/23/2017, 05/08/2018   Influenza, Seasonal, Injecte, Preservative Fre 08/01/2012   Influenza,inj,Quad PF,6+ Mos 04/07/2014   Pneumococcal Conjugate-13 12/03/2013   Pneumococcal Polysaccharide-23 07/18/2009   Tdap 12/20/2010   Zoster, Live 12/26/2011    Screening Tests Health Maintenance  Topic Date Due   DTaP/Tdap/Td (2 - Td or Tdap) 12/19/2020   INFLUENZA VACCINE  02/21/2024   MAMMOGRAM  10/03/2024   Medicare Annual Wellness (AWV)  10/28/2024   Pneumonia Vaccine 54+ Years old  Completed   DEXA SCAN  Completed   Hepatitis C Screening  Completed   HPV VACCINES  Aged Out   COVID-19 Vaccine  Discontinued   Fecal DNA (Cologuard)  Discontinued   Zoster Vaccines- Shingrix  Discontinued    Health Maintenance  Health Maintenance Due  Topic Date Due   DTaP/Tdap/Td (2 - Td or Tdap) 12/19/2020   Health Maintenance Items  Addressed:    Additional Screening:  Vision Screening: Recommended annual ophthalmology exams for early detection of glaucoma and other disorders of the eye.  Dental Screening: Recommended annual dental exams for proper oral hygiene  Community Resource Referral / Chronic Care Management: CRR required this visit?  No   CCM required this visit?  No     Plan:     I have personally reviewed and noted the following in the patient's chart:   Medical and social history Use of alcohol, tobacco or illicit drugs  Current medications and supplements including opioid prescriptions. Patient is not currently taking opioid prescriptions. Functional ability and status Nutritional status Physical activity Advanced directives List of other physicians Hospitalizations, surgeries, and ER visits in previous 12 months Vitals Screenings to include cognitive, depression, and falls Referrals and appointments  In addition, I have reviewed and discussed with patient certain preventive protocols, quality metrics, and best practice recommendations. A written personalized care plan for preventive services as well as general preventive health recommendations were provided to patient.     Tillie Rung, LPN   10/24/345   After Visit Summary: (MyChart) Due to this being a telephonic visit, the after visit summary with patients personalized plan was offered to patient via MyChart   Notes: Nothing significant to report at this time.

## 2023-11-07 ENCOUNTER — Ambulatory Visit (INDEPENDENT_AMBULATORY_CARE_PROVIDER_SITE_OTHER)

## 2023-11-07 DIAGNOSIS — E538 Deficiency of other specified B group vitamins: Secondary | ICD-10-CM

## 2023-11-07 MED ORDER — CYANOCOBALAMIN 1000 MCG/ML IJ SOLN
1000.0000 ug | Freq: Once | INTRAMUSCULAR | Status: AC
  Administered 2023-11-07: 1000 ug via INTRAMUSCULAR

## 2023-11-07 NOTE — Progress Notes (Signed)
 [name] is a 78 y.o. female presents to the office today for Monthly B12 injections, per physician's orders. Original order: on lab results from 06/04/23: - B12 is low.  Recommend B12 injection weekly x 4.  Then B12 over-the-counter 2000 mcg daily. Patient reports she was told by oncology B12 was "still low and to continue monthly injections until she came back to see PCP". She has appointment 12/02/23.    Cyanocobalamin (med), 1000 mg/ml (dose),  IM (route) was administered Left deltoid (location) today. Patient tolerated injection. Patient due for follow up labs/provider appt: Yes. Date due: 12/02/23.   [name]

## 2023-12-02 ENCOUNTER — Encounter: Payer: Self-pay | Admitting: Internal Medicine

## 2023-12-02 ENCOUNTER — Ambulatory Visit: Payer: Medicare Other | Admitting: Internal Medicine

## 2023-12-02 VITALS — BP 138/88 | HR 70 | Temp 97.8°F | Resp 16 | Ht 60.0 in | Wt 140.0 lb

## 2023-12-02 DIAGNOSIS — E785 Hyperlipidemia, unspecified: Secondary | ICD-10-CM | POA: Diagnosis not present

## 2023-12-02 DIAGNOSIS — I10 Essential (primary) hypertension: Secondary | ICD-10-CM

## 2023-12-02 DIAGNOSIS — E538 Deficiency of other specified B group vitamins: Secondary | ICD-10-CM | POA: Diagnosis not present

## 2023-12-02 DIAGNOSIS — E559 Vitamin D deficiency, unspecified: Secondary | ICD-10-CM

## 2023-12-02 DIAGNOSIS — E039 Hypothyroidism, unspecified: Secondary | ICD-10-CM | POA: Diagnosis not present

## 2023-12-02 LAB — LIPID PANEL
Cholesterol: 112 mg/dL (ref 0–200)
HDL: 49.7 mg/dL (ref >39.00)
LDL Cholesterol: 47 mg/dL (ref 0–99)
NonHDL: 62.47
Total CHOL/HDL Ratio: 2
Triglycerides: 79 mg/dL (ref 0.0–149.0)
VLDL: 15.8 mg/dL (ref 0.0–40.0)

## 2023-12-02 LAB — B12 AND FOLATE PANEL
Folate: 10.7 ng/mL (ref >5.9)
Vitamin B-12: 336 pg/mL (ref 211–911)

## 2023-12-02 LAB — VITAMIN D 25 HYDROXY (VIT D DEFICIENCY, FRACTURES): VITD: 78.43 ng/mL (ref 30.00–100.00)

## 2023-12-02 LAB — TSH: TSH: 2.32 u[IU]/mL (ref 0.35–5.50)

## 2023-12-02 MED ORDER — AMLODIPINE BESYLATE 10 MG PO TABS: 5.0000 mg | ORAL_TABLET | Freq: Every day | ORAL | Status: DC

## 2023-12-02 NOTE — Patient Instructions (Addendum)
 Continue taking vitamin D  and vitamin B12 supplements by mouth every day  Continue checking your blood pressure regularly Blood pressure goal:  between 110/65 and  135/85. If it is consistently higher or lower, let me know     GO TO THE LAB : Get the blood work     Next office visit for a physical exam by November 2025 Please make an appointment before you leave today

## 2023-12-02 NOTE — Progress Notes (Unsigned)
 Subjective:    Patient ID: [name], female    DOB: August 20, 1945, 78 y.o.   MRN: [id number]  DOS:  12/02/2023 Type of visit - description: Follow-up  Chronic medical problems addressed. She feels well. Ambulatory BPs typically in the 130s. Denies chest pain or difficulty breathing. No edema. Good med compliance  Review of Systems See above   Past Medical History:  Diagnosis Date   Anxiety and depression    Arthritis    Blood transfusion without reported diagnosis 1989   GB hemorrage    Cataract    Bil/no surgery yet.   Coccyx pain    Secondary to scar tissue from old pressure ulcer after back surgery, Dr. Afton Horse   Coronary artery disease    Depression 2010   panic attacks and depression   Eczema    GERD (gastroesophageal reflux disease)    Goiter    Headache(784.0)    Hyperlipidemia    Hypertension    Partial tear of right rotator cuff    & labral tear   S/P cardiac cath 11/24/2014   Revealing nonobstructive CAD w/ tubular, discrete 40% mid lesion in the circumflex; discrete 30% proximal lesion in the LAD; luminal irregularities, 35% mid lesion in RCA; and generic 40% mid lesion in the RCA. Medical treatment recommended.   Stroke [facility]) 2004   mild TIA    Past Surgical History:  Procedure Laterality Date   ABDOMINAL HYSTERECTOMY     oophorectomy L only   BREAST CYST ASPIRATION Left    BREAST CYST EXCISION Left    BREAST LUMPECTOMY     BREAST LUMPECTOMY WITH RADIOACTIVE SEED LOCALIZATION Left 09/08/2020   Procedure: LEFT BREAST LUMPECTOMY WITH RADIOACTIVE SEED LOCALIZATION;  Surgeon: Lockie Rima, MD;  Location: [facility];  Service: General;  Laterality: Left;   BUNIONECTOMY  1990s   LEFT   CHOLECYSTECTOMY     HAND SURGERY  1990s   R hand x 2, L hand x 1   LUMBAR LAMINECTOMY/DECOMPRESSION MICRODISCECTOMY N/A 11/13/2012   Procedure: MICRO LUMBAR DECOMPRESSION L4 - L5 AND L2 - L3 2 LEVELS;  Surgeon: Loel Ring, MD;  Location: [facility];  Service: Orthopedics;  Laterality: N/A;   SHOULDER SURGERY  2006   left     Current Outpatient Medications  Medication Instructions   acetaminophen  (TYLENOL ) 500 mg, Every 6 hours PRN   ALPRAZolam  (XANAX ) 0.25-0.5 mg, Oral, At bedtime PRN   amLODipine  (NORVASC ) 5 mg, Oral, Daily   aspirin 81 MG chewable tablet Daily   atorvastatin  (LIPITOR) 40 mg, Oral, Daily at bedtime   calcium -vitamin D  250-100 MG-UNIT tablet 1 tablet, 2 times daily   escitalopram  (LEXAPRO ) 10 mg, Oral, Daily   famotidine  (PEPCID ) 20 mg, Oral, 2 times daily   fluticasone -salmeterol (WIXELA INHUB) 250-50 MCG/ACT AEPB 1 puff, Inhalation, 2 times daily   levothyroxine  (SYNTHROID ) 50 mcg, Oral, Daily before breakfast   tamoxifen  (NOLVADEX ) 20 MG tablet TAKE 1 TABLET(20 MG) BY MOUTH DAILY       Objective:   Physical Exam BP 138/88   Pulse 70   Temp 97.8 F (36.6 C) (Oral)   Resp 16   Ht 5' (1.524 m)   Wt 140 lb (63.5 kg)   SpO2 97%   BMI 27.34 kg/m  General:   Well developed, NAD, BMI noted. HEENT:  Normocephalic . Face symmetric, atraumatic Lungs:  CTA B Normal respiratory effort, no intercostal retractions, no accessory muscle use. Heart: RRR,  no murmur.  Lower extremities: no pretibial edema bilaterally  Skin: Not pale. Not jaundice Neurologic:  alert & oriented X3.  Speech normal, gait appropriate for age and unassisted Psych--  Cognition and judgment appear intact.  Cooperative with normal attention span and concentration.  Behavior appropriate. No anxious or depressed appearing.      Assessment     Assessment  Prediabetes HTN Hyperlipidemia Thyroid  disease Anxiety depression, insomnia- xanax  rx by pcp GERD CV: TIA? (2004) CAD: catheterization 2009 and 2016: Non-obstructive CAD, 40% blockages.  Coronary CTA: LAD 25 to 49%, medical management Goiter: Last ultrasound 12-2014, nodules decrease in size MSK:  --Back - coccyxt pain, lumbar surgery 2014, chronic residual pain --on  Tramadol  (rx by back surgeon)   Vit d def Chronic Dizziness Eczema/psoriasis , sees derm Breast cancer 07/26/2020.  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  See OV note pulmonary 10/2020. Osteoporosis:   2016 T score -1.1, 06/2019 T score - 1.6; 09-2022: T-score  (-) 2.6 CAD: mild per coronary CTA 02/2023, medical management  PLAN: HTN: In the last few months, she has been taking amlodipine  10 mg: Half tablet daily, ambulatory BPs in the 130s.  BP upon arrival was slightly elevated.  Plan: Continue same meds, monitor BPs. Hyperlipidemia: Based on last cholesterol panel, atorvastatin  increased to 40 mg.  Check FLP. Anxiety, depression, insomnia: Controlled on Lexapro  and Xanax . Hypothyroidism: On Synthroid , check TSH. ILD: On Wixela, sxs at  baseline, occasional cough. Vitamin D  deficiency: On OTCs, check labs. B12 deficiency: Has been getting shots regularly, also on oral supplements, check levels, consider staying on oral supplements only. RTC 05-2024 CPX

## 2023-12-03 NOTE — Assessment & Plan Note (Signed)
 HTN: In the last few months, she has been taking amlodipine  10 mg: Half tablet daily, ambulatory BPs in the 130s.  BP upon arrival was slightly elevated.  Plan: Continue same meds, monitor BPs. Hyperlipidemia: Based on last cholesterol panel, atorvastatin  increased to 40 mg.  Check FLP. Anxiety, depression, insomnia: Controlled on Lexapro  and Xanax . Hypothyroidism: On Synthroid , check TSH. ILD: On Wixela, sxs at  baseline, occasional cough. Vitamin D  deficiency: On OTCs, check labs. B12 deficiency: Has been getting shots regularly, also on oral supplements, check levels, consider staying on oral supplements only. RTC 05-2024 CPX

## 2023-12-04 ENCOUNTER — Ambulatory Visit: Payer: Self-pay | Admitting: Internal Medicine

## 2023-12-05 ENCOUNTER — Other Ambulatory Visit: Payer: Self-pay | Admitting: Internal Medicine

## 2024-01-03 ENCOUNTER — Other Ambulatory Visit: Payer: Self-pay | Admitting: Internal Medicine

## 2024-01-27 ENCOUNTER — Other Ambulatory Visit: Payer: Self-pay | Admitting: Internal Medicine

## 2024-02-03 ENCOUNTER — Telehealth: Payer: Self-pay | Admitting: Hematology

## 2024-02-03 NOTE — Telephone Encounter (Signed)
 Called to reschedule patient appointment to due provider pal  request. I talked  to patient and they are aware of the changes that was made to the upcoming appointment

## 2024-02-20 ENCOUNTER — Other Ambulatory Visit: Payer: Self-pay | Admitting: Internal Medicine

## 2024-04-01 NOTE — Assessment & Plan Note (Signed)
 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 Patient opted to forego adjuvant radiation -She started tamoxifen  in 09/2020. She is tolerating well with minimal side effects. -most recent b/l MM on 09/29/21 was benign.  -left MM and US  on 02/20/22 for palpable nodule at incision site was benign. -she is clinically doing well.  Tolerating tamoxifen  without significant side effects.  Breast exam showed a 2 cm nodule at her incision site, consistent with the cyst seen on recent US .  Labs are unremarkable.  There is no clinical concern for breast cancer recurrence. -Continue surveillance and tamoxifen 

## 2024-04-02 ENCOUNTER — Inpatient Hospital Stay: Attending: Hematology

## 2024-04-02 ENCOUNTER — Other Ambulatory Visit: Payer: Self-pay | Admitting: Internal Medicine

## 2024-04-02 ENCOUNTER — Inpatient Hospital Stay (HOSPITAL_BASED_OUTPATIENT_CLINIC_OR_DEPARTMENT_OTHER): Admitting: Hematology

## 2024-04-02 VITALS — BP 128/64 | HR 100 | Temp 97.9°F | Resp 17 | Ht 60.0 in | Wt 136.2 lb

## 2024-04-02 DIAGNOSIS — Z170 Estrogen receptor positive status [ER+]: Secondary | ICD-10-CM | POA: Diagnosis not present

## 2024-04-02 DIAGNOSIS — M810 Age-related osteoporosis without current pathological fracture: Secondary | ICD-10-CM | POA: Insufficient documentation

## 2024-04-02 DIAGNOSIS — Z79810 Long term (current) use of selective estrogen receptor modulators (SERMs): Secondary | ICD-10-CM | POA: Diagnosis not present

## 2024-04-02 DIAGNOSIS — D696 Thrombocytopenia, unspecified: Secondary | ICD-10-CM | POA: Diagnosis not present

## 2024-04-02 DIAGNOSIS — E538 Deficiency of other specified B group vitamins: Secondary | ICD-10-CM | POA: Insufficient documentation

## 2024-04-02 DIAGNOSIS — Z1721 Progesterone receptor positive status: Secondary | ICD-10-CM | POA: Diagnosis not present

## 2024-04-02 DIAGNOSIS — C50412 Malignant neoplasm of upper-outer quadrant of left female breast: Secondary | ICD-10-CM | POA: Diagnosis not present

## 2024-04-02 DIAGNOSIS — Z1732 Human epidermal growth factor receptor 2 negative status: Secondary | ICD-10-CM | POA: Insufficient documentation

## 2024-04-02 LAB — CBC WITH DIFFERENTIAL (CANCER CENTER ONLY)
Abs Immature Granulocytes: 0.02 K/uL (ref 0.00–0.07)
Basophils Absolute: 0 K/uL (ref 0.0–0.1)
Basophils Relative: 1 %
Eosinophils Absolute: 0.1 K/uL (ref 0.0–0.5)
Eosinophils Relative: 1 %
HCT: 41.9 % (ref 36.0–46.0)
Hemoglobin: 14.1 g/dL (ref 12.0–15.0)
Immature Granulocytes: 0 %
Lymphocytes Relative: 29 %
Lymphs Abs: 2.3 K/uL (ref 0.7–4.0)
MCH: 31.5 pg (ref 26.0–34.0)
MCHC: 33.7 g/dL (ref 30.0–36.0)
MCV: 93.5 fL (ref 80.0–100.0)
Monocytes Absolute: 0.5 K/uL (ref 0.1–1.0)
Monocytes Relative: 6 %
Neutro Abs: 5.1 K/uL (ref 1.7–7.7)
Neutrophils Relative %: 63 %
Platelet Count: 143 K/uL — ABNORMAL LOW (ref 150–400)
RBC: 4.48 MIL/uL (ref 3.87–5.11)
RDW: 13.2 % (ref 11.5–15.5)
WBC Count: 8 K/uL (ref 4.0–10.5)
nRBC: 0 % (ref 0.0–0.2)

## 2024-04-02 LAB — CMP (CANCER CENTER ONLY)
ALT: 8 U/L (ref 0–44)
AST: 13 U/L — ABNORMAL LOW (ref 15–41)
Albumin: 3.9 g/dL (ref 3.5–5.0)
Alkaline Phosphatase: 48 U/L (ref 38–126)
Anion gap: 4 — ABNORMAL LOW (ref 5–15)
BUN: 13 mg/dL (ref 8–23)
CO2: 29 mmol/L (ref 22–32)
Calcium: 9.3 mg/dL (ref 8.9–10.3)
Chloride: 109 mmol/L (ref 98–111)
Creatinine: 0.87 mg/dL (ref 0.44–1.00)
GFR, Estimated: >60 mL/min (ref >60)
Glucose, Bld: 125 mg/dL — ABNORMAL HIGH (ref 70–99)
Potassium: 4.3 mmol/L (ref 3.5–5.1)
Sodium: 142 mmol/L (ref 135–145)
Total Bilirubin: 0.5 mg/dL (ref 0.0–1.2)
Total Protein: 6.5 g/dL (ref 6.5–8.1)

## 2024-04-02 MED ORDER — TAMOXIFEN CITRATE 20 MG PO TABS: 20.0000 mg | ORAL_TABLET | Freq: Every day | ORAL | 3 refills | Status: AC

## 2024-04-02 NOTE — Progress Notes (Signed)
 [facility]   [phone] [phone]   Clinic Follow up Note   Patient Care Team: Jenny Aloysius BRAVO, MD as PCP - General Copeland, [name], MD as Consulting Physician (Gynecology) Jenny Gwendlyn DASEN, MD (Inactive) as Consulting Physician (Gastroenterology) Vanderbilt [name], MD as Consulting Physician (General Surgery) Jenny Juliene RAMAN, MD as Attending Physician (Orthopedic Surgery) Jenny Kemps, MD as Consulting Physician (Orthopedic Surgery) Jenny Bouchard, DO as Consulting Physician (Obstetrics and Gynecology) Jenny Nanetta SAILOR, RN as Oncology Nurse Navigator Jenny Shoulders, MD as Consulting Physician (General Surgery) [name], MD as Consulting Physician (Hematology) Izell Domino, MD as Attending Physician (Radiation Oncology) Nicholaus Sherlean CROME, [facility] (Inactive) (Pharmacist) Nicholaus Sherlean CROME, [facility] (Inactive) (Pharmacist) Burton, [name], NP as Nurse Practitioner (Nurse Practitioner)  Date of Service:  04/02/2024  CHIEF COMPLAINT: f/u of left breast cancer  CURRENT THERAPY:  Adjuvant tamoxifen   Oncology History   Malignant neoplasm of upper-outer quadrant of left breast in female, estrogen receptor positive (HCC) Stage IA, c(T1bN0M0), ER+/PR+/HER2-, Grade I -Diagnosed in 07/2020, s/p left breast lumpectomy on 09/08/20 with Dr Jenny, path showed 0.7cm of invasive ductal carcinoma, with negative margins. Patient opted to forego adjuvant radiation -She started tamoxifen  in 09/2020. She is tolerating well with minimal side effects. -most recent b/l MM on 09/29/21 was benign.  -left MM and US  on 02/20/22 for palpable nodule at incision site was benign. -she is clinically doing well.  Tolerating tamoxifen  without significant side effects.  Breast exam showed a 2 cm nodule at her incision site, consistent with the cyst seen on recent US .  Labs are unremarkable.  There is no clinical concern for breast cancer recurrence. -Continue surveillance and  tamoxifen   Assessment & Plan Breast cancer of left upper-outer quadrant Breast cancer in the left upper-outer quadrant, status post surgery. She has been on tamoxifen  since 2022, with a plan to continue for a total of five years, ending in March 2027. A nodule, likely scar tissue, approximately 1.5 by 2 cm, is present and non-tender, with no change in size since last year. - Continue tamoxifen  until March 2027 - Refill tamoxifen  prescription for one year - Monitor nodule with annual mammogram unless changes are noted  Thrombocytopenia Chronic mild thrombocytopenia with a current platelet count of 143,000, which is well-managed and improved from previous counts.  Osteoporosis Osteoporosis with a history of osteopenia. She is taking calcium  and vitamin D  supplements and engages in regular exercise. She experiences occasional dizziness but has not had any falls. Tamoxifen  is noted to have a beneficial effect on bone strength. - Continue calcium  and vitamin D  supplementation - Encourage regular exercise - Advise caution to prevent falls, especially due to dizziness  Dizziness Intermittent dizziness without falls. She uses a cane for stability and is cautious when walking, especially on stairs. - Advise continued use of cane for stability - Encourage caution to prevent falls  Vitamin B12 deficiency Vitamin B12 deficiency, currently managed with oral supplementation. - Continue oral vitamin B12 supplementation  Plan - She is clinically doing well, exam was unremarkable except a 2 cm nodule in the left breast which has not changed - Follow-up in 6 months with lab. - Continue tamoxifen , I refilled today.   SUMMARY OF ONCOLOGIC HISTORY: Oncology History Overview Note  Cancer Staging Malignant neoplasm of upper-outer quadrant of left breast in female, estrogen receptor positive (HCC) Staging form: Breast, AJCC 8th Edition - Clinical stage from 07/26/2020: Stage IA (cT1b, cN0, cM0, G2, ER+,  PR+, HER2-) - Signed  by [name], MD on 08/09/2020    Malignant neoplasm of upper-outer quadrant of left breast in female, estrogen receptor positive (HCC)  07/04/2020 Mammogram   IMPRESSION: Indeterminate 4 x 8 x 5 mm irregular hypoechoic mass left breast 1 o'clock position 4 cm from the nipple.   07/26/2020 Cancer Staging   Staging form: Breast, AJCC 8th Edition - Clinical stage from 07/26/2020: Stage IA (cT1b, cN0, cM0, G2, ER+, PR+, HER2-) - Signed by [name], MD on 08/09/2020   07/26/2020 Initial Biopsy   Diagnosis Breast, left, needle core biopsy, upper outer 1 o'clock - INVASIVE DUCTAL CARCINOMA - SEE COMMENT Microscopic Comment Based on the biopsy, the carcinoma appears Nottingham grade 2 of 3 and measures 0.7 cm in greatest linear extent. Prognostic markers (ER/PR/ki-67/HER2) are pending and will be reported in an addendum. Dr. Jodene reviewed the case and agrees with the above diagnosis. These results were called to [facility] on July 27, 2018.   07/26/2020 Receptors her2   PROGNOSTIC INDICATORS Results: IMMUNOHISTOCHEMICAL AND MORPHOMETRIC ANALYSIS PERFORMED MANUALLY The tumor cells are NEGATIVE for Her2 (1+). Estrogen Receptor: 90%, POSITIVE, STRONG STAINING INTENSITY Progesterone Receptor: 80%, POSITIVE, STRONG STAINING INTENSITY Proliferation Marker Ki67: 2%   08/03/2020 Initial Diagnosis   Malignant neoplasm of upper-outer quadrant of left breast in female, estrogen receptor positive (HCC)   09/08/2020 Surgery   LEFT BREAST LUMPECTOMY WITH RADIOACTIVE SEED LOCALIZATION by Dr Jenny   09/08/2020 Pathology Results   FINAL MICROSCOPIC DIAGNOSIS:   A. 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  - Margins not involved.  - Invasive carcinoma 0.1 cm from anterior margin.  - Biopsy site and biopsy clip.  - Fibrocystic changes.  - See oncology table.     09/2020 -  Anti-estrogen oral therapy   Tamoxifen  20mg  daily starting  09/2020   12/27/2020 Cancer Staging   Staging form: Breast, AJCC 8th Edition - Pathologic: Stage Unknown (pT1b, pNX, cM0, G1, ER+, PR+, HER2-) - Signed by Ann Mayme POUR, NP on 12/27/2020 Histologic grading system: 3 grade system   12/29/2020 Survivorship   SCP delivered by Lacie Burton, NP    01/17/2021 Genetic Testing   Negative genetic testing:  No pathogenic variants detected on the [employer] CancerNext-Expanded + RNAinsight panel. The report date is 01/17/2021.   The CancerNext-Expanded + RNAinsight gene panel offered by [employer] and includes sequencing and rearrangement analysis for the following 77 genes: AIP, ALK, APC, ATM, AXIN2, BAP1, BARD1, BLM, BMPR1A, BRCA1, BRCA2, BRIP1, CDC73, CDH1, CDK4, CDKN1B, CDKN2A, CHEK2, CTNNA1, DICER1, FANCC, FH, FLCN, GALNT12, KIF1B, LZTR1, MAX, MEN1, MET, MLH1, MSH2, MSH3, MSH6, MUTYH, NBN, NF1, NF2, NTHL1, PALB2, PHOX2B, PMS2, POT1, PRKAR1A, PTCH1, PTEN, RAD51C, RAD51D, RB1, RECQL, RET, SDHA, SDHAF2, SDHB, SDHC, SDHD, SMAD4, SMARCA4, SMARCB1, SMARCE1, STK11, SUFU, TMEM127, TP53, TSC1, TSC2, VHL and XRCC2 (sequencing and deletion/duplication); EGFR, EGLN1, HOXB13, KIT, MITF, PDGFRA, POLD1 and POLE (sequencing only); EPCAM and GREM1 (deletion/duplication only). RNA data is routinely analyzed for use in variant interpretation for all genes.      Discussed the use of AI scribe software for clinical note transcription with the patient, who gave verbal consent to proceed.  History of Present Illness [name] is an 78 year old female with breast cancer who presents for follow-up.  She has been on tamoxifen  since 2022 and plans to continue for a total of five years without issues. A nodule, described as scar tissue from previous surgery, developed about a year ago and  measures approximately 2 by 1.5 cm.  She experienced blisters on her legs as a side effect of a bone medication she previously took, which she has since discontinued. Despite stopping the  medication, the blisters persist.  She has a history of low platelet count, which is stable today at 143. She had a mammogram in March and a bone density scan in March 2024. She takes calcium , vitamin D , and oral B12 supplements daily.     All other systems were reviewed with the patient and are negative.  MEDICAL HISTORY:  Past Medical History:  Diagnosis Date   Anxiety and depression    Arthritis    Blood transfusion without reported diagnosis 1989   GB hemorrage    Cataract    Bil/no surgery yet.   Coccyx pain    Secondary to scar tissue from old pressure ulcer after back surgery, Dr. Vanderbilt   Coronary artery disease    Depression 2010   panic attacks and depression   Eczema    GERD (gastroesophageal reflux disease)    Goiter    Headache(784.0)    Hyperlipidemia    Hypertension    Partial tear of right rotator cuff    & labral tear   S/P cardiac cath 11/24/2014   Revealing nonobstructive CAD w/ tubular, discrete 40% mid lesion in the circumflex; discrete 30% proximal lesion in the LAD; luminal irregularities, 35% mid lesion in RCA; and generic 40% mid lesion in the RCA. Medical treatment recommended.   Stroke [facility]) 2004   mild TIA    SURGICAL HISTORY: Past Surgical History:  Procedure Laterality Date   ABDOMINAL HYSTERECTOMY     oophorectomy L only   BREAST CYST ASPIRATION Left    BREAST CYST EXCISION Left    BREAST LUMPECTOMY     BREAST LUMPECTOMY WITH RADIOACTIVE SEED LOCALIZATION Left 09/08/2020   Procedure: LEFT BREAST LUMPECTOMY WITH RADIOACTIVE SEED LOCALIZATION;  Surgeon: Jenny Shoulders, MD;  Location: [facility];  Service: General;  Laterality: Left;   BUNIONECTOMY  1990s   LEFT   CHOLECYSTECTOMY     HAND SURGERY  1990s   R hand x 2, L hand x 1   LUMBAR LAMINECTOMY/DECOMPRESSION MICRODISCECTOMY N/A 11/13/2012   Procedure: MICRO LUMBAR DECOMPRESSION L4 - L5 AND L2 - L3 2 LEVELS;  Surgeon: Reyes JAYSON Billing, MD;  Location: [facility];  Service:  Orthopedics;  Laterality: N/A;   SHOULDER SURGERY  2006   left     I have reviewed the social history and family history with the patient and they are unchanged from previous note.  ALLERGIES:  is allergic to codeine, hydrocodone , and sertraline hcl.  MEDICATIONS:  Current Outpatient Medications  Medication Sig Dispense Refill   amLODipine  (NORVASC ) 10 MG tablet Take 1 tablet (10 mg total) by mouth daily. 90 tablet 1   escitalopram  (LEXAPRO ) 20 MG tablet Take 0.5 tablets (10 mg total) by mouth daily. 45 tablet 1   famotidine  (PEPCID ) 20 MG tablet Take 1 tablet (20 mg total) by mouth 2 (two) times daily. 180 tablet 1   levothyroxine  (SYNTHROID ) 50 MCG tablet Take 1 tablet (50 mcg total) by mouth daily before breakfast. 90 tablet 1   acetaminophen  (TYLENOL ) 500 MG tablet Take 500 mg by mouth every 6 (six) hours as needed.     ALPRAZolam  (XANAX ) 0.25 MG tablet Take 1-2 tablets (0.25-0.5 mg total) by mouth at bedtime as needed for anxiety or sleep. 30 tablet 2   aspirin 81 MG chewable tablet  Chew by mouth daily.     atorvastatin  (LIPITOR) 40 MG tablet Take 1 tablet (40 mg total) by mouth at bedtime. 90 tablet 1   calcium -vitamin D  250-100 MG-UNIT tablet Take 1 tablet by mouth 2 (two) times daily.     fluticasone -salmeterol (WIXELA INHUB) 250-50 MCG/ACT AEPB Inhale 1 puff into the lungs in the morning and at bedtime. 1 each 6   tamoxifen  (NOLVADEX ) 20 MG tablet Take 1 tablet (20 mg total) by mouth daily. 90 tablet 3   No current facility-administered medications for this visit.    PHYSICAL EXAMINATION: ECOG PERFORMANCE STATUS: 0 - Asymptomatic  Vitals:   04/02/24 1027  BP: 128/64  Pulse: 100  Resp: 17  Temp: 97.9 F (36.6 C)  SpO2: 96%   Wt Readings from Last 3 Encounters:  04/02/24 136 lb 3.2 oz (61.8 kg)  12/02/23 140 lb (63.5 kg)  10/29/23 137 lb (62.1 kg)     GENERAL:alert, no distress and comfortable SKIN: skin color, texture, turgor are normal, no rashes or significant  lesions EYES: normal, Conjunctiva are pink and non-injected, sclera clear NECK: supple, thyroid  normal size, non-tender, without nodularity LYMPH:  no palpable lymphadenopathy in the cervical, axillary  LUNGS: clear to auscultation and percussion with normal breathing effort HEART: regular rate & rhythm and no murmurs and no lower extremity edema ABDOMEN:abdomen soft, non-tender and normal bowel sounds Musculoskeletal:no cyanosis of digits and no clubbing  NEURO: alert & oriented x 3 with fluent speech, no focal motor/sensory deficits BREAST: left breast with 1.5x2 cm nodule at 1 o'clock position, likely scar tissue, no tenderness.  No other palpable breast mass or adenopathy. Physical Exam   LABORATORY DATA:  I have reviewed the data as listed    Latest Ref Rng & Units 04/02/2024   10:12 AM 10/08/2023   11:14 AM 04/08/2023   11:43 AM  CBC  WBC 4.0 - 10.5 K/uL 8.0  8.9  7.6   Hemoglobin 12.0 - 15.0 g/dL 85.8  86.5  86.0   Hematocrit 36.0 - 46.0 % 41.9  40.8  41.1   Platelets 150 - 400 K/uL 143  115  114         Latest Ref Rng & Units 04/02/2024   10:12 AM 10/08/2023   11:14 AM 04/08/2023   11:43 AM  CMP  Glucose 70 - 99 mg/dL 874  881  96   BUN 8 - 23 mg/dL 13  13  12    Creatinine 0.44 - 1.00 mg/dL 9.12  9.19  9.18   Sodium 135 - 145 mmol/L 142  139  139   Potassium 3.5 - 5.1 mmol/L 4.3  3.8  3.7   Chloride 98 - 111 mmol/L 109  106  106   CO2 22 - 32 mmol/L 29  29  29    Calcium  8.9 - 10.3 mg/dL 9.3  8.9  8.9   Total Protein 6.5 - 8.1 g/dL 6.5  6.1  6.3   Total Bilirubin 0.0 - 1.2 mg/dL 0.5  0.4  0.5   Alkaline Phos 38 - 126 U/L 48  52  53   AST 15 - 41 U/L 13  12  13    ALT 0 - 44 U/L 8  6  6        RADIOGRAPHIC STUDIES: I have personally reviewed the radiological images as listed and agreed with the findings in the report. No results found.    No orders of the defined types were placed in this encounter.  All  questions were answered. The patient knows to call the  clinic with any problems, questions or concerns. No barriers to learning was detected. The total time spent in the appointment was 25 minutes, including review of chart and various tests results, discussions about plan of care and coordination of care plan     Onita Mattock, MD 04/02/2024

## 2024-04-09 ENCOUNTER — Ambulatory Visit: Admitting: Hematology

## 2024-04-09 ENCOUNTER — Other Ambulatory Visit

## 2024-05-05 ENCOUNTER — Ambulatory Visit: Payer: Self-pay | Admitting: Pulmonary Disease

## 2024-05-05 ENCOUNTER — Ambulatory Visit (INDEPENDENT_AMBULATORY_CARE_PROVIDER_SITE_OTHER): Admitting: Acute Care

## 2024-05-05 ENCOUNTER — Encounter: Payer: Self-pay | Admitting: Acute Care

## 2024-05-05 ENCOUNTER — Ambulatory Visit (INDEPENDENT_AMBULATORY_CARE_PROVIDER_SITE_OTHER)

## 2024-05-05 VITALS — BP 122/70 | HR 89 | Temp 98.4°F | Ht 61.0 in | Wt 137.6 lb

## 2024-05-05 DIAGNOSIS — R0602 Shortness of breath: Secondary | ICD-10-CM

## 2024-05-05 DIAGNOSIS — Z87891 Personal history of nicotine dependence: Secondary | ICD-10-CM

## 2024-05-05 DIAGNOSIS — R079 Chest pain, unspecified: Secondary | ICD-10-CM

## 2024-05-05 DIAGNOSIS — I700 Atherosclerosis of aorta: Secondary | ICD-10-CM | POA: Diagnosis not present

## 2024-05-05 DIAGNOSIS — J849 Interstitial pulmonary disease, unspecified: Secondary | ICD-10-CM | POA: Diagnosis not present

## 2024-05-05 MED ORDER — PREDNISONE 10 MG PO TABS: ORAL_TABLET | ORAL | 0 refills | Status: DC

## 2024-05-05 NOTE — Telephone Encounter (Signed)
 FYI Only or Action Required?: FYI only for provider.  Patient is followed in Pulmonology for ILD, last seen on 09/26/2023 by Hunsucker, [name], MD.  Called Nurse Triage reporting Chest Pain and Shortness of Breath.  Symptoms began several days ago.  Interventions attempted: Prescription medications: tramadol  and Maintenance inhaler.  Symptoms are: unchanged.  Triage Disposition: See HCP Within 4 Hours (Or PCP Triage)  Patient/caregiver understands and will follow disposition?: Yes                             Copied from CRM #[id number]. Topic: Clinical - Red Word Triage >> May 05, 2024  2:24 PM [name] wrote: Red Word that prompted transfer to Nurse Triage: Pt is wanting to schedule an appt with Dr. Annella in [place].  Sx: Dull chest pain between breasts that hurts when she leans over and/or tries to sleep, and she's having SOB. Sx have been present for a few days now.  No appt was made, WT to NT. Reason for Disposition  Nursing judgment or information in reference  Answer Assessment - Initial Assessment Questions E2C2 Pulmonary Triage - Initial Assessment Questions     1. Have you used any OTC meds to help with symptoms? If yes, ask What medications? Denies   2. Have you used your inhalers/maintenance medication? If yes, What medications? Fluticasone -salmeterol inhaler as prescribed  3. Do you wear supplemental oxygen? If yes, How many liters are you supposed to use? Denies   4. Do you monitor your oxygen levels? If yes, What is your reading (oxygen level) today? Denies     1. LOCATION: Where does it hurt?       In between breasts/sternum  2. RADIATION: Does the pain go anywhere else? (e.g., into neck, jaw, arms, back)     Upper back, denies radiation to neck and jaw 3. ONSET: When did the chest pain begin? (Minutes, hours or days)      Sunday 4. PATTERN: Does the pain come and go, or has it been constant since it  started?  Does it get worse with exertion?      Comes and goes, worsens upon exertion 5. DURATION: How long does it last (e.g., seconds, minutes, hours)     States pain lasts until she rests  6. SEVERITY: How bad is the pain?  (e.g., Scale 1-10; mild, moderate, or severe)     Denies pain at rest, states pain is clearly made worse by movement, states she also notices pain depending on how she lays in bed 7. CARDIAC RISK FACTORS: Do you have any history of heart problems or risk factors for heart disease? (e.g., angina, prior heart attack; diabetes, high blood pressure, high cholesterol, smoker, or strong family history of heart disease)     Denies 8. PULMONARY RISK FACTORS: Do you have any history of lung disease?  (e.g., blood clots in lung, asthma, emphysema, birth control pills)     Lung disease, per chart 9. CAUSE: What do you think is causing the chest pain?     Unsure 10. OTHER SYMPTOMS: Do you have any other symptoms? (e.g., dizziness, nausea, vomiting, sweating, fever, difficulty breathing, cough)     Mild SOB upon exertion- patient able to speak in clear and complete sentences while on phone with this RN, states she has SOB at baseline, ongoing dizziness, hoarseness, coughing after taking inhaler, denies NV 11. PREGNANCY: Is there any chance you are pregnant? When was  your last menstrual period?     N/A  Protocols used: Chest Pain-A-AH, No Guideline Available-A-AH

## 2024-05-05 NOTE — Patient Instructions (Addendum)
 It is good to see you today. We have done a CXR today, which shows the Interstitial lung disease , but no other abnormal finding.  We will do an EKG just to make sure this is not  heart issue.  Please follow up with Dr. Lavona, EKG does show a left Bundle Branch Block that was just noted to be a conduction slowing on last EKG.  We will see if prednisone  helps. Prednisone  taper; 10 mg tablets: 4 tabs x 2 days, 3 tabs x 2 days, 2 tabs x 2 days 1 tab x 2 days then stop.  I have ordered a High Resolution CT of the chest to evaluate for progression of your ILD. Follow up with Dr Annella or Lauraine 1-2 weeks after the scan to review results.  Continue using your Wixela as you have been doing. Rinse mouth after use.  Call if you need us . Please contact office for sooner follow up if symptoms do not improve or worsen or seek emergency care

## 2024-05-05 NOTE — Progress Notes (Signed)
 History of Present Illness [name] is a 78 y.o. female last seen 09/26/2023 by Dr. Annella . She has past medical    05/05/2024 Discussed the use of AI scribe software for clinical note transcription with the patient, who gave verbal consent to proceed.  History of Present Illness Pt. Presents for  acute visit for chest pain and shortness of breath. She endorses    [name] is an 78 year old female with interstitial lung disease who presents with chest pain and increased coughing.  She experiences chest pain that sometimes radiates to her right shoulder and back, with no radiation to her left arm. The pain is not linked to specific activities but can worsen when bending over. No recent trauma or activities explain the chest pain.  She has a history of interstitial lung disease and was switched to Wixela for possible asthma. Her cough produces clear sputum. She uses prednisone  during flares but not daily. She has not been on medications specifically for pulmonary fibrosis.  A CT of the chest was performed in January 2025, and she has had multiple imaging studies at [facility] on [address]. She has not seen her cardiologist in about a year, who previously found no cardiac issues.  Her family history includes a cousin with heart disease who underwent open heart surgery, but her parents did not have heart disease. Her father is 18 years old and in good health. She reports that her heart doctor did not find anything wrong with her heart and that her parents did not have heart disease, but she is concerned due to her cousin's condition.  [place] and has been feeling sad and upset recently due to the loss of her dog, which she had to put down last week due to lymphoid cancer.     Test Results: CXR 05/05/2024 Eventration of the right hemidiaphragm. Anterior right upper lobe and left perihilar reticulation, corresponding to  the fibrotic changes on the prior CT. No new airspace consolidation, pleural effusion, or pneumothorax. No cardiomegaly. Tortuous aorta with aortic atherosclerosis. No acute fracture or destructive lesions. Multilevel thoracic osteophytosis. Osteopenia. Surgical clips in the left breast.   IMPRESSION: Redemonstrated findings of underlying interstitial lung disease. Otherwise, no acute cardiopulmonary abnormality.    Latest Ref Rng & Units 04/02/2024   10:12 AM 10/08/2023   11:14 AM 04/08/2023   11:43 AM  CBC  WBC 4.0 - 10.5 K/uL 8.0  8.9  7.6   Hemoglobin 12.0 - 15.0 g/dL 85.8  86.5  86.0   Hematocrit 36.0 - 46.0 % 41.9  40.8  41.1   Platelets 150 - 400 K/uL 143  115  114        Latest Ref Rng & Units 04/02/2024   10:12 AM 10/08/2023   11:14 AM 04/08/2023   11:43 AM  BMP  Glucose 70 - 99 mg/dL 874  881  96   BUN 8 - 23 mg/dL 13  13  12    Creatinine 0.44 - 1.00 mg/dL 9.12  9.19  9.18   Sodium 135 - 145 mmol/L 142  139  139   Potassium 3.5 - 5.1 mmol/L 4.3  3.8  3.7   Chloride 98 - 111 mmol/L 109  106  106   CO2 22 - 32 mmol/L 29  29  29    Calcium  8.9 - 10.3 mg/dL 9.3  8.9  8.9     BNP No results found for: BNP  ProBNP No results found for: PROBNP  PFT No results found for: FEV1PRE, FEV1POST, FVCPRE, FVCPOST, TLC, DLCOUNC, PREFEV1FVCRT, PSTFEV1FVCRT  No results found.   Past medical hx Past Medical History:  Diagnosis Date   Anxiety and depression    Arthritis    Blood transfusion without reported diagnosis 1989   GB hemorrage    Cataract    Bil/no surgery yet.   Coccyx pain    Secondary to scar tissue from old pressure ulcer after back surgery, Dr. Vanderbilt   Coronary artery disease    Depression 2010   panic attacks and depression   Eczema    GERD (gastroesophageal reflux disease)    Goiter    Headache(784.0)    Hyperlipidemia    Hypertension    Partial tear of right rotator cuff    & labral tear   S/P cardiac cath 11/24/2014    Revealing nonobstructive CAD w/ tubular, discrete 40% mid lesion in the circumflex; discrete 30% proximal lesion in the LAD; luminal irregularities, 35% mid lesion in RCA; and generic 40% mid lesion in the RCA. Medical treatment recommended.   Stroke [facility]) 2004   mild TIA     Social History   Tobacco Use   Smoking status: Former    Current packs/day: 0.00    Average packs/day: 1 pack/day for 20.0 years (20.0 ttl pk-yrs)    Types: Cigarettes    Start date: 72    Quit date: 2000    Years since quitting: 25.8   Smokeless tobacco: Never   Tobacco comments:    quit aprox 2000 after 30 years, 1 to 1.5 ppd  Substance Use Topics   Alcohol use: No   Drug use: No    Ms.Nyquist reports that she quit smoking about 25 years ago. Her smoking use included cigarettes. She started smoking about 45 years ago. She has a 20 pack-year smoking history. She has never used smokeless tobacco. She reports that she does not drink alcohol and does not use drugs.  Tobacco Cessation: Counseling given: Not Answered Tobacco comments: quit aprox 2000 after 30 years, 1 to 1.5 ppd   Past surgical hx, Family hx, Social hx all reviewed.  Current Outpatient Medications on File Prior to Visit  Medication Sig   acetaminophen  (TYLENOL ) 500 MG tablet Take 500 mg by mouth every 6 (six) hours as needed.   ALPRAZolam  (XANAX ) 0.25 MG tablet Take 1-2 tablets (0.25-0.5 mg total) by mouth at bedtime as needed for anxiety or sleep.   amLODipine  (NORVASC ) 10 MG tablet Take 1 tablet (10 mg total) by mouth daily.   aspirin 81 MG chewable tablet Chew by mouth daily.   atorvastatin  (LIPITOR) 40 MG tablet Take 1 tablet (40 mg total) by mouth at bedtime.   calcium -vitamin D  250-100 MG-UNIT tablet Take 1 tablet by mouth 2 (two) times daily.   escitalopram  (LEXAPRO ) 20 MG tablet Take 0.5 tablets (10 mg total) by mouth daily.   famotidine  (PEPCID ) 20 MG tablet Take 1 tablet (20 mg total) by mouth 2 (two) times daily.    fluticasone -salmeterol (WIXELA INHUB) 250-50 MCG/ACT AEPB Inhale 1 puff into the lungs in the morning and at bedtime.   levothyroxine  (SYNTHROID ) 50 MCG tablet Take 1 tablet (50 mcg total) by mouth daily before breakfast.   tamoxifen  (NOLVADEX ) 20 MG tablet Take 1 tablet (20 mg total) by mouth daily.   No current facility-administered medications on file prior to visit.     Allergies  Allergen Reactions   Codeine Other (  See Comments)      chest and stomach pain, severe abd cramping   Hydrocodone      Whelts all over/swelling in lips   Sertraline Hcl Other (See Comments)     muscle aches, diarrhea, nausea    Review Of Systems:  Constitutional:   No  weight loss, night sweats,  Fevers, chills, fatigue, or  lassitude.  HEENT:   No headaches,  Difficulty swallowing,  Tooth/dental problems, or  Sore throat,                No sneezing, itching, ear ache, nasal congestion, post nasal drip,   CV:  No chest pain,  Orthopnea, PND, swelling in lower extremities, anasarca, dizziness, palpitations, syncope.   GI  No heartburn, indigestion, abdominal pain, nausea, vomiting, diarrhea, change in bowel habits, loss of appetite, bloody stools.   Resp: No shortness of breath with exertion or at rest.  No excess mucus, no productive cough,  No non-productive cough,  No coughing up of blood.  No change in color of mucus.  No wheezing.  No chest wall deformity  Skin: no rash or lesions.  GU: no dysuria, change in color of urine, no urgency or frequency.  No flank pain, no hematuria   MS:  No joint pain or swelling.  No decreased range of motion.  No back pain.  Psych:  No change in mood or affect. No depression or anxiety.  No memory loss.   Vital Signs BP 122/70   Pulse 89   Temp 98.4 F (36.9 C) (Oral)   Ht 5' 1 (1.549 m)   Wt 137 lb 9.6 oz (62.4 kg)   SpO2 96%   BMI 26.00 kg/m    Physical Exam:  General- No distress,  A&Ox3 ENT: No sinus tenderness, TM clear, pale nasal mucosa, no  oral exudate,no post nasal drip, no LAN Cardiac: S1, S2, regular rate and rhythm, no murmur Chest: No wheeze/ rales/ dullness; no accessory muscle use, no nasal flaring, no sternal retractions Abd.: Soft Non-tender Ext: No clubbing cyanosis, edema Neuro:  normal strength Skin: No rashes, warm and dry Psych: normal mood and behavior   Assessment/Plan  Assessment and Plan Assessment & Plan Interstitial lung disease with increased cough and chest pain Chronic interstitial lung disease with increased cough and chest pain. No fever or discolored sputum. Chest x-ray shows no acute abnormalities. Prednisone  effective in past flares. Differential includes intercostal cartilage inflammation. - Order high-resolution chest CT. - Prescribe prednisone  taper. - Continue Wixela. - Follow up with Dr. Delayne or Latoshia Monrroy in 1-2 weeks to review CT results and assess symptom improvement.  Left bundle branch block Newly identified left bundle branch block on EKG. Previous EKGs normal. No recent cardiology evaluation. Differential includes nonspecific intraventricular conduction delay. - Recommend follow-up with cardiologist to review EKG findings. - Advise daughter to inform cardiologist about recent EKG and chest pain.       Lauraine JULIANNA Lites, NP 05/05/2024  3:45 PM

## 2024-05-06 ENCOUNTER — Encounter: Payer: Self-pay | Admitting: Acute Care

## 2024-05-06 NOTE — Telephone Encounter (Signed)
 Seen by Lauraine 05/05/24

## 2024-05-11 ENCOUNTER — Telehealth: Payer: Self-pay

## 2024-05-11 NOTE — Telephone Encounter (Signed)
 Pt's daughter called in regarding a recommendation from the pt's PCP to reach out to her cardiologist. Pt has been having chest discomfort that radiates at times to her right shoulder and back. Pt has ongoing shortness of breath but daughter stated this is not new. Today the pt stated she is feeling better and is without any discomfort since starting prednisone  prescribed by her PCP for possible bronchitis. Daughter is asking Dr. Lavona to review records and determine if she needs to be seen. See most recent coronary CTA results:    Lynwood Lavona, MD 03/09/2023  3:39 PM EDT     She has coronary calcium  but only mild coronary plaque.  LAD has 25 - 49%.  She needs continued risk reduction.  Radiology over read is pending.   Call Ms. Brizzi with the results and send results to Paz, Jose E, MD

## 2024-05-12 NOTE — Telephone Encounter (Signed)
 Spoke with pt. Appt made for 11/14 with Dr. Lavona. Pt given ED precautions. Pt verbalized understanding. All questions if any were answered.

## 2024-05-15 ENCOUNTER — Ambulatory Visit
Admission: RE | Admit: 2024-05-15 | Discharge: 2024-05-15 | Disposition: A | Discharge status: Home / Self Care | Source: Ambulatory Visit | Attending: Acute Care | Admitting: Acute Care

## 2024-05-15 DIAGNOSIS — J479 Bronchiectasis, uncomplicated: Secondary | ICD-10-CM | POA: Diagnosis not present

## 2024-05-15 DIAGNOSIS — R079 Chest pain, unspecified: Secondary | ICD-10-CM

## 2024-05-15 DIAGNOSIS — J849 Interstitial pulmonary disease, unspecified: Secondary | ICD-10-CM

## 2024-05-15 DIAGNOSIS — R0602 Shortness of breath: Secondary | ICD-10-CM

## 2024-06-04 NOTE — Progress Notes (Unsigned)
 Cardiology Office Note:   Date:  06/05/2024  ID:  [name], DOB 1946-01-26, MRN [id number] PCP: [name], MD  [facility] Providers Cardiologist:  Lynwood Schilling, MD {  History of Present Illness:   [name] is a 78 y.o. female who is referred for evaluation of aortic atherosclerosis and SOB .     She had a perfusion study in 2014 that was negative for ischemia.    She had non obstructive CAD with cath in 2016.   When I saw her in 2024 she did have some progressive shortness of breath.  I sent her for coronary CTA.  She had mild mid LAD plaque 25 to 59% stenosis and scattered luminal irregularities.  She did have some interstitial lung disease.  She had high-resolution CT mildly progressive from her previous CT and is followed by pulmonary.  Since I last saw her she was in the pulmonary office in October.  She was having some chest discomfort.  This was midsternum and somewhat sharp.  There was some radiation into her back.  She also had some right shoulder and arm discomfort.  They referred her here although they did think she might have a mild colitis and she might of had a bit of a cough.  She was given a steroid pack and she says the symptoms actually abated.  She otherwise has not had no complaints.  She denies any new chest pressure otherwise, neck or arm discomfort.  She has not had new shortness of breath but has some chronic dyspnea.  She has mild chronic cough.  She does not have PND or orthopnea.  She has had no palpitations, presyncope or syncope.   ROS: As stated in the HPI and negative for all other systems.  Studies Reviewed:    EKG:   EKG Interpretation Date/Time:  Friday June 05 2024 12:03:51 EST Ventricular Rate:  74 PR Interval:  164 QRS Duration:  112 QT Interval:  392 QTC Calculation: 435 R Axis:   -17  Text Interpretation: Normal sinus rhythm Possible Left atrial enlargement Poor anterior R wave progression Non specific IVCD When  compared with ECG of 01-Mar-2023 11:19, No significant change since last tracing Confirmed by Schilling Lynwood ([id number]) on 06/05/2024 12:17:20 PM    Risk Assessment/Calculations:              Physical Exam:   VS:  BP 114/70   Pulse 74   Ht 5' 1 (1.549 m)   Wt 136 lb (61.7 kg)   SpO2 95%   BMI 25.70 kg/m    Wt Readings from Last 3 Encounters:  06/05/24 136 lb (61.7 kg)  05/05/24 137 lb 9.6 oz (62.4 kg)  04/02/24 136 lb 3.2 oz (61.8 kg)     GEN: Well nourished, well developed in no acute distress NECK: No JVD; No carotid bruits CARDIAC: RRR, no murmurs, rubs, gallops RESPIRATORY: Diffuse fine crackles ABDOMEN: Soft, non-tender, non-distended EXTREMITIES:  No edema; No deformity   ASSESSMENT AND PLAN:   Aortic atherosclerosis: She will continue with risk reduction.  No change in therapy.   HTN: The blood pressure is at target.  No change in therapy.   CAD: She did have some chest discomfort but this was nonanginal sounding and has resolved with steroids.  She had nonobstructive disease on CTA last year.  I do not think that further testing is indicated but she should continue with primary risk reduction.   Dyslipidemia: Her LDL was excellent  at 38 with an HDL of 49.7.   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  No change in therapy or further testing is indicated.  Follow up with me in 1 year  Signed, Lynwood Schilling, MD

## 2024-06-05 ENCOUNTER — Other Ambulatory Visit: Payer: Self-pay | Admitting: Internal Medicine

## 2024-06-05 ENCOUNTER — Encounter: Payer: Self-pay | Admitting: Cardiology

## 2024-06-05 ENCOUNTER — Ambulatory Visit: Attending: Cardiology | Admitting: Cardiology

## 2024-06-05 ENCOUNTER — Ambulatory Visit: Admitting: Pulmonary Disease

## 2024-06-05 VITALS — BP 114/70 | HR 74 | Ht 61.0 in | Wt 136.0 lb

## 2024-06-05 DIAGNOSIS — I2510 Atherosclerotic heart disease of native coronary artery without angina pectoris: Secondary | ICD-10-CM

## 2024-06-05 DIAGNOSIS — E785 Hyperlipidemia, unspecified: Secondary | ICD-10-CM

## 2024-06-05 DIAGNOSIS — I700 Atherosclerosis of aorta: Secondary | ICD-10-CM

## 2024-06-05 DIAGNOSIS — I10 Essential (primary) hypertension: Secondary | ICD-10-CM

## 2024-06-05 NOTE — Patient Instructions (Signed)
 Medication Instructions:  Your physician recommends that you continue on your current medications as directed. Please refer to the Current Medication list given to you today.  *If you need a refill on your cardiac medications before your next appointment, please call your pharmacy*  Lab Work: NONE If you have labs (blood work) drawn today and your tests are completely normal, you will receive your results only by: MyChart Message (if you have MyChart) OR A paper copy in the mail If you have any lab test that is abnormal or we need to change your treatment, we will call you to review the results.  Testing/Procedures: NONE  Follow-Up: At [facility], you and your health needs are our priority.  As part of our continuing mission to provide you with exceptional heart care, our providers are all part of one team.  This team includes your primary Cardiologist (physician) and Advanced Practice Providers or APPs (Physician Assistants and Nurse Practitioners) who all work together to provide you with the care you need, when you need it.  Your next appointment:   1 year(s)  Provider:   Lavonne Prairie, MD  We recommend signing up for the patient portal called MyChart.  Sign up information is provided on this After Visit Summary.  MyChart is used to connect with patients for Virtual Visits (Telemedicine).  Patients are able to view lab/test results, encounter notes, upcoming appointments, etc.  Non-urgent messages can be sent to your provider as well.   To learn more about what you can do with MyChart, go to [email or web address].

## 2024-06-09 ENCOUNTER — Encounter: Admitting: Internal Medicine

## 2024-06-17 ENCOUNTER — Ambulatory Visit: Admitting: Pulmonary Disease

## 2024-06-17 ENCOUNTER — Encounter: Payer: Self-pay | Admitting: Pulmonary Disease

## 2024-06-17 VITALS — BP 134/64 | HR 80 | Temp 98.3°F | Ht 61.0 in | Wt 138.0 lb

## 2024-06-17 DIAGNOSIS — J849 Interstitial pulmonary disease, unspecified: Secondary | ICD-10-CM | POA: Diagnosis not present

## 2024-06-17 LAB — SEDIMENTATION RATE: Sed Rate: 9 mm/h (ref 0–30)

## 2024-06-17 LAB — C-REACTIVE PROTEIN: CRP: <0.5 mg/dL (ref 0.5–20.0)

## 2024-06-17 MED ORDER — TRELEGY ELLIPTA 200-62.5-25 MCG/ACT IN AEPB: 1.0000 | INHALATION_SPRAY | Freq: Every day | RESPIRATORY_TRACT | 11 refills | Status: DC

## 2024-06-17 NOTE — Progress Notes (Signed)
 @Patient  ID: [name], female    DOB: Jul 09, 1946, 78 y.o.   MRN: [id number]  Chief Complaint  Patient presents with   Shortness of Breath    SOB and some cough persistent.    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  Multiple PCP notes reviewed.  Most recent pulmonary note Jenny Lites, NP reviewed.  A bit more short of breath over the last few months.  He was able to tolerate Wixela at 250 mcg dose.  Higher doses we thought were causing some nausea.  Despite this symptoms not really improved.  She had a repeat CT scan ordered by NP that shows similar fibrotic changes to before, unchanged from 08/12/2023 and 02/09/2023 review of all scans on my interpretation.  Compared to initial scan in 01/2021, slight progression over time.  Discussed at length that since this author with COVID I think this is related.  This could be stable and certainly stable over the last 18 months or so.  Suspect a slight change over time could be reaching final conclusion of fibrosis after initial insult that took longer than average, over 6 months but within the year which is not unexpected.  We discussed additional blood work to assess for possible HSP or inflammatory condition would treat differently.  Again with stability of images over the last year plus I think this is unlikely.  HPI at initial visit: Patient onset of viral symptoms around Thanksgiving 2022.  Cough, congestion, sore throat.  Prompted chest x-ray 06/13/2020 with bilateral interstitial infiltrates on my interpretation.  She was prescribed antibiotics.  Repeat chest x-ray 07/21/2020 with persistent slightly worse bilateral infiltrates on my interpretation.  Again prescribed antibiotics.  Had chest x-ray 10/05/2020 which showed persistent infiltrates slightly improved.  Notably, in the interim in 07/2018 she testifies for Covid.  She had  D-dimer performed the same day the chest x-ray given these dyspnea symptoms it was positive.  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  Cough is improved.  Still with dyspnea exertion.  Described as severe.  Worse with inclines or stairs.  No times a day were better or worse.  No seasonal or environmental changes.  No positional changes.  No alleviating or exacerbating factors.  PMH: Anxiety, GERD, breast cancer status post lumpectomy on tamoxifen  therapy Surgical history: Lumpectomy Family history: Mother with melanoma, colon cancer, Social history: Former smoker, approximate 20-pack-year history, quit 20+ years ago around 2000  Questionaires / Pulmonary Flowsheets:   ACT:      No data to display          MMRC:     No data to display          Epworth:      No data to display          Tests:   FENO:  No results found for: NITRICOXIDE  PFT:     No data to display          WALK:      No data to display          Imaging: Reviewed as per EMR discussion this note No results found.   Lab Results: Reviewed and as  per EMR CBC    Component Value Date/Time   WBC 8.0 04/02/2024 1012   WBC 7.2 10/08/2022 1304   RBC 4.48 04/02/2024 1012   HGB 14.1 04/02/2024 1012   HCT 41.9 04/02/2024 1012   PLT 143 (L) 04/02/2024 1012   MCV 93.5 04/02/2024 1012   MCH 31.5 04/02/2024 1012   MCHC 33.7 04/02/2024 1012   RDW 13.2 04/02/2024 1012   LYMPHSABS 2.3 04/02/2024 1012   MONOABS 0.5 04/02/2024 1012   EOSABS 0.1 04/02/2024 1012   BASOSABS 0.0 04/02/2024 1012    BMET    Component Value Date/Time   NA 142 04/02/2024 1012   NA 140 03/01/2023 1210   K 4.3 04/02/2024 1012   CL 109 04/02/2024 1012   CO2 29 04/02/2024 1012   GLUCOSE 125 (H)  04/02/2024 1012   GLUCOSE 131 (H) 06/19/2006 1135   BUN 13 04/02/2024 1012   BUN 13 03/01/2023 1210   CREATININE 0.87 04/02/2024 1012   CREATININE 0.80 04/27/2020 1056   CALCIUM  9.3 04/02/2024 1012   GFRNONAA >60 04/02/2024 1012   GFRAA >90 11/21/2012 0045    BNP No results found for: BNP  ProBNP No results found for: PROBNP  Specialty Problems       Pulmonary Problems   SOB (shortness of breath)   Allergies  Allergen Reactions   Codeine Other (See Comments)      chest and stomach pain, severe abd cramping   Hydrocodone      Whelts all over/swelling in lips   Sertraline Hcl Other (See Comments)     muscle aches, diarrhea, nausea    Immunization History  Administered Date(s) Administered   Fluad Quad(high Dose 65+) 04/28/2019, 04/27/2020   INFLUENZA, HIGH DOSE SEASONAL PF 06/01/2015, 05/23/2017, 05/08/2018   Influenza Whole 05/09/2007, 07/18/2009, 06/12/2010   Influenza, Seasonal, Injecte, Preservative Fre 08/01/2012   Influenza,inj,Quad PF,6+ Mos 04/07/2014   Pneumococcal Conjugate-13 12/03/2013   Pneumococcal Polysaccharide-23 07/18/2009   Tdap 12/20/2010   Zoster, Live 12/26/2011    Past Medical History:  Diagnosis Date   Anxiety and depression    Arthritis    Blood transfusion without reported diagnosis 1989   GB hemorrage    Cataract    Bil/no surgery yet.   Coccyx pain    Secondary to scar tissue from old pressure ulcer after back surgery, Dr. Vanderbilt   Coronary artery disease    Depression 2010   panic attacks and depression   Eczema    GERD (gastroesophageal reflux disease)    Goiter    Headache(784.0)    Hyperlipidemia    Hypertension    Partial tear of right rotator cuff    & labral tear   S/P cardiac cath 11/24/2014   Revealing nonobstructive CAD w/ tubular, discrete 40% mid lesion in the circumflex; discrete 30% proximal lesion in the LAD; luminal irregularities, 35% mid lesion in RCA; and generic 40% mid lesion in the RCA. Medical  treatment recommended.   Stroke [facility]) 2004   mild TIA    Tobacco History: Social History   Tobacco Use  Smoking Status Former   Current packs/day: 0.00   Average packs/day: 1 pack/day for 20.0 years (20.0 ttl pk-yrs)   Types: Cigarettes   Start date: 67   Quit date: 2000   Years since quitting: 25.9  Smokeless Tobacco Never  Tobacco Comments   quit aprox 2000 after 30 years, 1 to 1.5 ppd   Counseling given: Not Answered Tobacco comments: quit aprox 2000 after 30 years,  1 to 1.5 ppd   Continue to not smoke  Outpatient Encounter Medications as of 06/17/2024  Medication Sig   acetaminophen  (TYLENOL ) 500 MG tablet Take 500 mg by mouth every 6 (six) hours as needed.   ALPRAZolam  (XANAX ) 0.25 MG tablet Take 1-2 tablets (0.25-0.5 mg total) by mouth at bedtime as needed for anxiety or sleep.   amLODipine  (NORVASC ) 10 MG tablet Take 1 tablet (10 mg total) by mouth daily.   aspirin 81 MG chewable tablet Chew by mouth daily.   atorvastatin  (LIPITOR) 40 MG tablet Take 1 tablet (40 mg total) by mouth at bedtime.   calcium -vitamin D  250-100 MG-UNIT tablet Take 1 tablet by mouth 2 (two) times daily.   escitalopram  (LEXAPRO ) 20 MG tablet Take 0.5 tablets (10 mg total) by mouth daily.   famotidine  (PEPCID ) 20 MG tablet Take 1 tablet (20 mg total) by mouth 2 (two) times daily.   Fluticasone -Umeclidin-Vilant (TRELEGY ELLIPTA ) 200-62.5-25 MCG/ACT AEPB Inhale 1 puff into the lungs daily.   levothyroxine  (SYNTHROID ) 50 MCG tablet Take 1 tablet (50 mcg total) by mouth daily before breakfast.   tamoxifen  (NOLVADEX ) 20 MG tablet Take 1 tablet (20 mg total) by mouth daily.   [DISCONTINUED] fluticasone -salmeterol (WIXELA INHUB) 250-50 MCG/ACT AEPB Inhale 1 puff into the lungs in the morning and at bedtime.   [DISCONTINUED] predniSONE  (DELTASONE ) 10 MG tablet Prednisone  taper; 10 mg tablets: 4 tabs x 2 days, 3 tabs x 2 days, 2 tabs x 2 days 1 tab x 2 days then stop. (Patient not taking: Reported on  06/05/2024)   No facility-administered encounter medications on file as of 06/17/2024.     Review of Systems  Review of Systems  N/a   Physical Exam  BP 134/64   Pulse 80   Temp 98.3 F (36.8 C) (Oral)   Ht 5' 1 (1.549 m)   Wt 138 lb (62.6 kg)   SpO2 94% Comment: room air  BMI 26.07 kg/m   Wt Readings from Last 5 Encounters:  06/17/24 138 lb (62.6 kg)  06/05/24 136 lb (61.7 kg)  05/05/24 137 lb 9.6 oz (62.4 kg)  04/02/24 136 lb 3.2 oz (61.8 kg)  12/02/23 140 lb (63.5 kg)    BMI Readings from Last 5 Encounters:  06/17/24 26.07 kg/m  06/05/24 25.70 kg/m  05/05/24 26.00 kg/m  04/02/24 26.60 kg/m  12/02/23 27.34 kg/m     Physical Exam General: Well-appearing in no acute distress Eyes: EOMI, no icterus Neck: Supple no JVP Cardiovascular: Regular rhythm, no murmur Pulmonary: Clear rotation bilaterally no crackles Abdomen: Nondistended, bowel sounds present MSK: No synovitis, no joint effusion Neuro: No weakness, normal gait Psych: Normal mood, flat affect   Assessment & Plan:    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  No connective tissue diseases symptoms.  Will screen with serologic work-up.  Most likely irreversible.  Unlikely to worsen if related to viral process.  Still some time to hope for recovery or improvement given Covid + 07/2020.  Although I am suspicious that this initially started 05/2021 given abnormal chest x-ray and possible viral illness, possibly Covid at that time.  CT scan 01/2023 slightly progressed from initial 01/2021 although the initial scan is within 1 year of the insult so could just relay or signify final fibrotic stage within the year of insult.  Stable.  Repeat CT scan 07/2023 unchanged, repeat CT scan 04/2024 unchanged.  Will check hypersensitivity labs panel, CRP, sed rate today.  Prior  serologic workup was negative.  If unrevealing, no further treatment, but plan for serial  CT scans every 1 to 2 years.  Possible asthma: Well-controlled with low-dose Breo in the past.  Change of Wixela high-dose with nausea.  Symptoms not well-controlled, more dyspnea.  Escalate to Trelegy.  New prescription today.   Return in about 2 months (around 08/17/2024) for f/u Dr. Annella.   Donnice JONELLE Annella, MD 06/17/2024

## 2024-06-17 NOTE — Patient Instructions (Signed)
 Blood work today  We will discuss results at next visit and  discuss treatment options if needed  Try Trelegy - 1 puff once a day -r inse mouth after using  This replaced the wixela - I the trelegy is too expensive then just stick with the wixela  Return to clinic in 2 months or sooner as needed

## 2024-06-22 LAB — HYPERSENSITIVITY PNEUMONITIS
A. Pullulans Abs: NEGATIVE
A.Fumigatus #1 Abs: NEGATIVE
Micropolyspora faeni, IgG: NEGATIVE
Pigeon Serum Abs: NEGATIVE
Thermoact. Saccharii: NEGATIVE
Thermoactinomyces vulgaris, IgG: NEGATIVE

## 2024-07-03 ENCOUNTER — Other Ambulatory Visit: Payer: Self-pay | Admitting: Internal Medicine

## 2024-07-21 ENCOUNTER — Ambulatory Visit: Admitting: Internal Medicine

## 2024-07-21 ENCOUNTER — Encounter: Payer: Self-pay | Admitting: Internal Medicine

## 2024-07-21 VITALS — BP 132/82 | HR 77 | Temp 98.0°F | Resp 16 | Ht 61.0 in | Wt 137.2 lb

## 2024-07-21 DIAGNOSIS — R739 Hyperglycemia, unspecified: Secondary | ICD-10-CM | POA: Diagnosis not present

## 2024-07-21 DIAGNOSIS — E559 Vitamin D deficiency, unspecified: Secondary | ICD-10-CM | POA: Diagnosis not present

## 2024-07-21 DIAGNOSIS — Z79899 Other long term (current) drug therapy: Secondary | ICD-10-CM

## 2024-07-21 DIAGNOSIS — F419 Anxiety disorder, unspecified: Secondary | ICD-10-CM

## 2024-07-21 DIAGNOSIS — E785 Hyperlipidemia, unspecified: Secondary | ICD-10-CM

## 2024-07-21 DIAGNOSIS — F32A Depression, unspecified: Secondary | ICD-10-CM | POA: Diagnosis not present

## 2024-07-21 DIAGNOSIS — E039 Hypothyroidism, unspecified: Secondary | ICD-10-CM | POA: Diagnosis not present

## 2024-07-21 DIAGNOSIS — Z0000 Encounter for general adult medical examination without abnormal findings: Secondary | ICD-10-CM | POA: Diagnosis not present

## 2024-07-21 DIAGNOSIS — Z0001 Encounter for general adult medical examination with abnormal findings: Secondary | ICD-10-CM

## 2024-07-21 NOTE — Patient Instructions (Addendum)
 GO TO THE LAB :  Get the blood work    Then, go to the front desk for the checkout Please make an appointment for a checkup in 6 months   HYPERTENSION: Your blood pressure is well controlled with your current medication. -Continue taking amlodipine  as prescribed.  HYPERLIPIDEMIA: Your lipid levels are satisfactory with your current medication. -Continue taking atorvastatin  40 mg daily.  PREDIABETES: We are monitoring your blood sugar levels. -Checked your HbA1c today.  ANXIETY AND DEPRESSION: Your symptoms are stable with your current medication. -Continue taking Xanax  at bedtime as needed. -Continue taking Lexapro  10 mg daily.  HYPOTHYROIDISM: You are managing your hypothyroidism with medication. -Continue taking levothyroxine  as prescribed.  Will check your blood work today.  OSTEOPOROSIS: You have osteoporosis and have had adverse reactions to oral medication. You have declined injectable therapy. -Continue taking vitamin D  daily.  HISTORY OF BREAST CANCER: You have a history of breast cancer and are currently taking tamoxifen . -Continue taking tamoxifen  as prescribed.  VITAMIN D  DEFICIENCY: Your vitamin D  levels are satisfactory with supplementation. -Continue taking vitamin D  supplements.  B12 DEFICIENCY: Your vitamin B12 levels are satisfactory. Continue taking supplements   .  GENERAL HEALTH MAINTENANCE: We discussed your vaccinations and general health maintenance.

## 2024-07-21 NOTE — Progress Notes (Unsigned)
 "  Subjective:    Patient ID: [name], female    DOB: Dec 17, 1945, 78 y.o.   MRN: [id number]  DOS:  07/21/2024 Here for CPX Discussed the use of AI scribe software for clinical note transcription with the patient, who gave verbal consent to proceed.  History of Present Illness [name] is an 78 year old female who presents for an annual physical exam.  Anxiety - Chronic anxiety triggered by most activities - Anxiety limits ability to drive - Lives with her son due to anxiety-related limitations - Uses Xanax  as needed - Takes half a tablet of Lexapro  daily  Respiratory symptoms - Chronic lung disease with intermittent breathing difficulty - Uses Trelegy and a separate inhaler prescribed by pulmonologist - Inhalers improve symptoms but cause an evening productive cough with phlegm - No recent chest pain  Osteoporosis - Osteoporosis diagnosis - Unable to tolerate oral osteoporosis medication and discontinued it - Declined injectable osteoporosis therapy - Takes vitamin D  daily     Review of Systems  Other than above, a 14 point review of systems is negative     Past Medical History:  Diagnosis Date   Anxiety and depression    Arthritis    Blood transfusion without reported diagnosis 1989   GB hemorrage    Cataract    Bil/no surgery yet.   Coccyx pain    Secondary to scar tissue from old pressure ulcer after back surgery, Dr. Vanderbilt   Coronary artery disease    Depression 2010   panic attacks and depression   Eczema    GERD (gastroesophageal reflux disease)    Goiter    Headache(784.0)    Hyperlipidemia    Hypertension    Partial tear of right rotator cuff    & labral tear   S/P cardiac cath 11/24/2014   Revealing nonobstructive CAD w/ tubular, discrete 40% mid lesion in the circumflex; discrete 30% proximal lesion in the LAD; luminal irregularities, 35% mid lesion in RCA; and generic 40% mid lesion in the RCA. Medical treatment recommended.    Stroke [facility]) 2004   mild TIA    Past Surgical History:  Procedure Laterality Date   ABDOMINAL HYSTERECTOMY     oophorectomy L only   BREAST CYST ASPIRATION Left    BREAST CYST EXCISION Left    BREAST LUMPECTOMY     BREAST LUMPECTOMY WITH RADIOACTIVE SEED LOCALIZATION Left 09/08/2020   Procedure: LEFT BREAST LUMPECTOMY WITH RADIOACTIVE SEED LOCALIZATION;  Surgeon: [name], MD;  Location: [facility];  Service: General;  Laterality: Left;   BUNIONECTOMY  1990s   LEFT   CHOLECYSTECTOMY     HAND SURGERY  1990s   R hand x 2, L hand x 1   LUMBAR LAMINECTOMY/DECOMPRESSION MICRODISCECTOMY N/A 11/13/2012   Procedure: MICRO LUMBAR DECOMPRESSION L4 - L5 AND L2 - L3 2 LEVELS;  Surgeon: Reyes JAYSON Billing, MD;  Location: [facility];  Service: Orthopedics;  Laterality: N/A;   SHOULDER SURGERY  2006   left     Current Outpatient Medications  Medication Instructions   acetaminophen  (TYLENOL ) 500 mg, Every 6 hours PRN   ALPRAZolam  (XANAX ) 0.25-0.5 mg, Oral, At bedtime PRN   amLODipine  (NORVASC ) 10 mg, Oral, Daily   aspirin 81 MG chewable tablet Daily   atorvastatin  (LIPITOR) 40 mg, Oral, Daily at bedtime   calcium -vitamin D  250-100 MG-UNIT tablet 1 tablet, 2 times daily   escitalopram  (LEXAPRO ) 10 mg, Oral, Daily   famotidine  (PEPCID ) 20 mg,  Oral, 2 times daily   Fluticasone -Umeclidin-Vilant (TRELEGY ELLIPTA ) 200-62.5-25 MCG/ACT AEPB 1 puff, Inhalation, Daily   levothyroxine  (SYNTHROID ) 50 mcg, Oral, Daily before breakfast   tamoxifen  (NOLVADEX ) 20 mg, Oral, Daily       Objective:   Physical Exam BP 132/82   Pulse 77   Temp 98 F (36.7 C) (Oral)   Resp 16   Ht 5' 1 (1.549 m)   Wt 137 lb 4 oz (62.3 kg)   SpO2 96%   BMI 25.93 kg/m  General: Well developed, NAD, BMI noted Neck: No  thyromegaly  HEENT:  Normocephalic . Face symmetric, atraumatic Lungs:  CTA B Normal respiratory effort, no intercostal retractions, no accessory muscle use. Heart: RRR,  no murmur.   Abdomen:  Not distended, soft, non-tender. No rebound or rigidity.   Lower extremities: no pretibial edema bilaterally  Skin: Exposed areas without rash. Not pale. Not jaundice Neurologic:  alert & oriented X3.  Speech normal, gait appropriate for age and unassisted Strength symmetric and appropriate for age.  Psych: Cognition and judgment appear intact.  Cooperative with normal attention span and concentration.  Behavior appropriate. No anxious or depressed appearing.     Assessment   Assessment  Prediabetes HTN Hyperlipidemia Thyroid  disease Anxiety depression, insomnia- xanax  rx by pcp GERD CV: TIA? (2004) CAD: catheterization 2009 and 2016: Non-obstructive CAD, 40% blockages.  Coronary CTA: LAD 25 to 49%, medical management Goiter: Last ultrasound 12-2014, nodules decrease in size MSK:  --Back - coccyxt pain, lumbar surgery 2014, chronic residual pain --on Tramadol  (rx by back surgeon)   Vit d def Chronic Dizziness Eczema/psoriasis , sees derm Breast cancer 07/26/2020.  Lumpectomy 09/08/2020, Rx tamoxifen  Pulmonary: fibrosis seen on CT chest, ILD, possibly COVID-related, responding to [facility].  See OV note pulmonary 10/2020. Osteoporosis:   2016 T score -1.1, 06/2019 T score - 1.6; 09-2022: T-score  (-) 2.6   Assessment & Plan Here for CPX -Td 2012  - PNM 23: 2010;  prevnar--2015.   - shingles shot 2013 - Vaccines I recommend: PNM 20, Shingrix , Tdap, RSV, flu and COVID-vaccine.  Benefits discussed, declines. - Female care: See previous entry, no further Pap smears. MMG: 09/2023 ([facility]) - CCS: Cscope 2004, + polyps adenomatous colonoscopy  07-2009, no polyps.  + cologuard 2019, Cscope 01/2018, 2 polyps, next due.  Has decided not to pursue at this time -Labs reviewed, will get A1c, TSH and UDS  Other issues: HTN: BP controlled, continue amlodipine  High cholesterol: Lipid levels satisfactory with atorvastatin .  No change Prediabetes Monitoring with HbA1c.  Anxiety and  depression Symptoms not 100% controlled but stable with current medication and desires no change. - Continue Xanax  at bedtime as needed. - Continue Lexapro  10 mg daily. Hypothyroidism: Check TSH. Osteoporosis Managed by oncology, they recommended injections and she declined.  Took oral medication but had a reaction.  Currently main is vitamin D . Previous adverse reaction to oral medication. Declined injections. Breast cancer: Last visit with oncology 04/02/2024, on tamoxifen  Vitamin D  deficiency Vitamin D  levels satisfactory with supplementation. - Continue vitamin D  supplementation. B12 deficiency  Vitamin B12 levels satisfactory.  ILD: Saw pulmonology in November and will see them in few weeks.  Will see him again in few weeks. CAD, aortic sclerosis, saw cardiology 06/05/2024, felt to be stable RTC 6 months        "

## 2024-07-22 ENCOUNTER — Encounter: Payer: Self-pay | Admitting: Internal Medicine

## 2024-07-22 DIAGNOSIS — E039 Hypothyroidism, unspecified: Secondary | ICD-10-CM | POA: Insufficient documentation

## 2024-07-22 LAB — HEMOGLOBIN A1C: Hgb A1c MFr Bld: 5.4 % (ref 4.6–6.5)

## 2024-07-22 LAB — TSH: TSH: 2.25 u[IU]/mL (ref 0.35–5.50)

## 2024-07-22 NOTE — Assessment & Plan Note (Addendum)
 Here for CPX -Td 2012  - PNM 23: 2010;  prevnar--2015.   - shingles shot 2013 - Vaccines I recommend: PNM 20, Shingrix , Tdap, RSV, flu and COVID-vaccine.  Benefits discussed, declines. - Female care: See previous entry, no further Pap smears. MMG: 09/2023 ([facility]) - CCS: Cscope 2004, + polyps adenomatous colonoscopy  07-2009, no polyps.  + cologuard 2019, Cscope 01/2018, 2 polyps, next due.  Has decided not to pursue at this time -Labs reviewed, will get A1c, TSH and UDS

## 2024-07-22 NOTE — Assessment & Plan Note (Addendum)
 Here for CPX  Other issues: HTN: BP controlled, continue amlodipine  High cholesterol: Lipid levels satisfactory with atorvastatin .  No change Prediabetes Monitoring with HbA1c.  Anxiety and depression Symptoms not 100% controlled but stable with current medication and desires no change. - Continue Xanax  at bedtime as needed. - Continue Lexapro  10 mg daily. Hypothyroidism: Check TSH. Osteoporosis Managed by oncology, they recommended injections and she declined.  Took oral medication but had a reaction.  Currently main is vitamin D . Previous adverse reaction to oral medication. Declined injections. Breast cancer: Last visit with oncology 04/02/2024, on tamoxifen  Vitamin D  deficiency Vitamin D  levels satisfactory with supplementation. - Continue vitamin D  supplementation. B12 deficiency  Vitamin B12 levels satisfactory.  ILD: Saw pulmonology in November and will see them in few weeks.  Will see him again in few weeks. CAD, aortic sclerosis, saw cardiology 06/05/2024, felt to be stable RTC 6 months

## 2024-07-24 ENCOUNTER — Other Ambulatory Visit: Payer: Self-pay | Admitting: Internal Medicine

## 2024-07-24 ENCOUNTER — Other Ambulatory Visit (INDEPENDENT_AMBULATORY_CARE_PROVIDER_SITE_OTHER)

## 2024-07-24 DIAGNOSIS — Z79899 Other long term (current) drug therapy: Secondary | ICD-10-CM

## 2024-07-24 DIAGNOSIS — F32A Depression, unspecified: Secondary | ICD-10-CM

## 2024-07-27 ENCOUNTER — Ambulatory Visit: Payer: Self-pay | Admitting: Internal Medicine

## 2024-07-27 LAB — DRUG TOX MONITOR 1 W/CONF, ORAL FLD
Amphetamines: NEGATIVE ng/mL
Barbiturates: NEGATIVE ng/mL
Benzodiazepines: NEGATIVE ng/mL
Buprenorphine: NEGATIVE ng/mL
Cocaine: NEGATIVE ng/mL
Cotinine: >250 ng/mL — ABNORMAL HIGH
Fentanyl: NEGATIVE ng/mL
Heroin Metabolite: NEGATIVE ng/mL
MARIJUANA: NEGATIVE ng/mL
MDMA: NEGATIVE ng/mL
Meprobamate: NEGATIVE ng/mL
Methadone: NEGATIVE ng/mL
Nicotine Metabolite: POSITIVE ng/mL — AB
Opiates: NEGATIVE ng/mL
Phencyclidine: NEGATIVE ng/mL
Tapentadol: NEGATIVE ng/mL
Tramadol: NEGATIVE ng/mL
Zolpidem: NEGATIVE ng/mL

## 2024-08-17 ENCOUNTER — Ambulatory Visit: Admitting: Pulmonary Disease

## 2024-08-18 ENCOUNTER — Ambulatory Visit: Payer: Self-pay

## 2024-08-18 ENCOUNTER — Ambulatory Visit: Admitting: Internal Medicine

## 2024-08-18 VITALS — BP 137/52 | HR 96 | Temp 97.8°F | Resp 16 | Ht 61.0 in | Wt 136.6 lb

## 2024-08-18 DIAGNOSIS — J849 Interstitial pulmonary disease, unspecified: Secondary | ICD-10-CM

## 2024-08-18 DIAGNOSIS — R051 Acute cough: Secondary | ICD-10-CM | POA: Diagnosis not present

## 2024-08-18 DIAGNOSIS — J101 Influenza due to other identified influenza virus with other respiratory manifestations: Secondary | ICD-10-CM

## 2024-08-18 LAB — POC COVID19/FLU A&B COMBO
Covid Antigen, POC: NEGATIVE
Influenza A Antigen, POC: POSITIVE — AB
Influenza B Antigen, POC: NEGATIVE

## 2024-08-18 MED ORDER — OSELTAMIVIR PHOSPHATE 75 MG PO CAPS: 75.0000 mg | ORAL_CAPSULE | Freq: Two times a day (BID) | ORAL | 0 refills | Status: AC

## 2024-08-18 MED ORDER — FLUTICASONE-SALMETEROL 250-50 MCG/ACT IN AEPB: 1.0000 | INHALATION_SPRAY | Freq: Two times a day (BID) | RESPIRATORY_TRACT | Status: AC

## 2024-08-18 NOTE — Telephone Encounter (Signed)
 FYI Only or Action Required?: FYI only for provider: appointment scheduled on 08/18/2024 2:00 PM [name], [facility].  Patient was last seen in primary care on 07/21/2024 by [name], MD.  Called Nurse Triage reporting Cough.  Symptoms began last Saturday .  Interventions attempted: OTC medications: dayquil and nyquil .  Symptoms are: stable.  Triage Disposition: See Physician Within 24 Hours  Patient/caregiver understands and will follow disposition?: Yes   Reason for Disposition  SEVERE coughing spells (e.g., whooping sound after coughing, vomiting after coughing)  Answer Assessment - Initial Assessment Questions Patient reports symptoms started  last Saturday in the evening. Feeling very unsteady , dry cough. Kept getting worse. Causing to hurt in ribs. Son got nyquil and dayquil and tylenol  and prednisone  that has at house. Feels like it breaking up but not resolved. White mucus . Deep breath induce cough . Cough is severe. Booked appointment    1. ONSET: When did the cough begin?      Last Saturday   2. SEVERITY: How bad is the cough today?      Severe  3. SPUTUM: Describe the color of your sputum (e.g., none, dry cough; clear, white, yellow, green)     Whiteish 4. HEMOPTYSIS: Are you coughing up any blood? If Yes, ask: How much? (e.g., flecks, streaks, tablespoons, etc.)     denies 5. DIFFICULTY BREATHING: Are you having difficulty breathing? If Yes, ask: How bad is it? (e.g., mild, moderate, severe)      Denies  6. FEVER: Do you have a fever? If Yes, ask: What is your temperature, how was it measured, and when did it start?     Denies    10. OTHER SYMPTOMS: Do you have any other symptoms? (e.g., runny nose, wheezing, chest pain)        Patient denies the following chest pain, shortness of breath, fever , wheezing  Protocols used: Cough - Acute Productive-A-AH  Message from Avram MATSU sent at 08/18/2024 12:19 PM EST  Reason for  Triage: dayquil and night quil, nothing is helping, really sore on breast bone. coughing white mucus. Jittery when walking.

## 2024-08-18 NOTE — Telephone Encounter (Signed)
 Pt as appt schedule

## 2024-08-18 NOTE — Patient Instructions (Signed)
 You have influenza.  Rest  Drink plenty of fluids  Tylenol  as needed  Take Tamiflu , an antiviral, for the next 5 days.  Prescription sent.  Continue your inhaler  Robitussin DM as needed for cough  If you feel much worse: Go to the emergency room  If you are not gradually improving let me know

## 2024-08-18 NOTE — Progress Notes (Signed)
 "  Subjective:    Patient ID: [name], female    DOB: [birth year], 79 y.o.   MRN: [id number]  DOS:  08/18/2024  Acute  Discussed the use of AI scribe software for clinical note transcription with the patient, who gave verbal consent to proceed.  History of Present Illness    Respiratory symptoms - Dry, hacky cough began three days ago, initially nonproductive, now producing some sputum - Ears feel stopped up - No fever, chills, sore throat, or shortness of breath - No known sick contacts at home - Diarrhea began three days ago, coinciding with onset of cough - No nausea or vomiting - Wobbly and shaky sensation - Body aches and joint soreness - Self prescribed a couple of doses of prednisone  (leftover).  Pulmonary disease management - Interstitial lung disease managed with Wixela (fluticasone /salmeterol), one puff in the morning and one at night - Unable to afford Trelegy - No rescue inhaler available (albuterol or Ventolin) - Took prednisone  previously prescribed by pulmonologist for similar symptoms, with some improvement during current episode  Immunization status - No influenza or COVID-19 vaccines received    Review of Systems See above   Past Medical History:  Diagnosis Date   Anxiety and depression    Arthritis    Blood transfusion without reported diagnosis 1989   GB hemorrage    Cataract    Bil/no surgery yet.   Coccyx pain    Secondary to scar tissue from old pressure ulcer after back surgery, Dr. Vanderbilt   Coronary artery disease    Depression 2010   panic attacks and depression   Eczema    GERD (gastroesophageal reflux disease)    Goiter    Headache(784.0)    Hyperlipidemia    Hypertension    Partial tear of right rotator cuff    & labral tear   S/P cardiac cath 11/24/2014   Revealing nonobstructive CAD w/ tubular, discrete 40% mid lesion in the circumflex; discrete 30% proximal lesion in the LAD; luminal irregularities, 35% mid lesion in  RCA; and generic 40% mid lesion in the RCA. Medical treatment recommended.   Stroke [facility]) 2004   mild TIA    Past Surgical History:  Procedure Laterality Date   ABDOMINAL HYSTERECTOMY     oophorectomy L only   BREAST CYST ASPIRATION Left    BREAST CYST EXCISION Left    BREAST LUMPECTOMY     BREAST LUMPECTOMY WITH RADIOACTIVE SEED LOCALIZATION Left 09/08/2020   Procedure: LEFT BREAST LUMPECTOMY WITH RADIOACTIVE SEED LOCALIZATION;  Surgeon: [name], MD;  Location: [facility];  Service: General;  Laterality: Left;   BUNIONECTOMY  1990s   LEFT   CHOLECYSTECTOMY     HAND SURGERY  1990s   R hand x 2, L hand x 1   LUMBAR LAMINECTOMY/DECOMPRESSION MICRODISCECTOMY N/A 11/13/2012   Procedure: MICRO LUMBAR DECOMPRESSION L4 - L5 AND L2 - L3 2 LEVELS;  Surgeon: Reyes JAYSON Billing, MD;  Location: [facility];  Service: Orthopedics;  Laterality: N/A;   SHOULDER SURGERY  2006   left     Current Outpatient Medications  Medication Instructions   acetaminophen  (TYLENOL ) 500 mg, Every 6 hours PRN   ALPRAZolam  (XANAX ) 0.25-0.5 mg, Oral, At bedtime PRN   amLODipine  (NORVASC ) 10 mg, Oral, Daily   aspirin 81 MG chewable tablet Daily   atorvastatin  (LIPITOR) 40 mg, Oral, Daily at bedtime   calcium -vitamin D  250-100 MG-UNIT tablet 1 tablet, 2 times daily   escitalopram  (LEXAPRO ) 20  MG tablet TAKE 1/2 TABLET(10 MG) BY MOUTH DAILY   famotidine  (PEPCID ) 20 mg, Oral, 2 times daily   Fluticasone -Umeclidin-Vilant (TRELEGY ELLIPTA ) 200-62.5-25 MCG/ACT AEPB 1 puff, Inhalation, Daily   levothyroxine  (SYNTHROID ) 50 mcg, Oral, Daily before breakfast   tamoxifen  (NOLVADEX ) 20 mg, Oral, Daily       Objective:   Physical Exam BP (!) 137/52 (BP Location: Right Arm, Patient Position: Sitting, Cuff Size: Small)   Pulse 96   Temp 97.8 F (36.6 C) (Oral)   Resp 16   Ht 5' 1 (1.549 m)   Wt 136 lb 9.6 oz (62 kg)   SpO2 95%   BMI 25.81 kg/m  General:   Well developed, NAD, BMI noted. HEENT:   Normocephalic . Face symmetric, atraumatic.  TMs normal.  Throat symmetric, no red.  Nose slightly congested Lungs:  Decreased breath sounds, no wheezing, few rhonchi with cough Normal respiratory effort, no intercostal retractions, no accessory muscle use. Heart: RRR,  no murmur.  Lower extremities: no pretibial edema bilaterally  Skin: Not pale. Not jaundice Neurologic:  alert & oriented X3.  Speech normal, gait appropriate for age and unassisted Psych--  Cognition and judgment appear intact.  Cooperative with normal attention span and concentration.  Behavior appropriate. No anxious or depressed appearing.      Assessment   Assessment  Prediabetes HTN Hyperlipidemia Thyroid  disease Anxiety depression, insomnia- xanax  rx by pcp GERD CV: TIA? (2004) CAD: catheterization 2009 and 2016: Non-obstructive CAD, 40% blockages.  Coronary CTA: LAD 25 to 49%, medical management Goiter: Last ultrasound 12-2014, nodules decrease in size MSK:  --Back - coccyxt pain, lumbar surgery 2014, chronic residual pain --on Tramadol  (rx by back surgeon)   Vit d def Chronic Dizziness Eczema/psoriasis , sees derm Breast cancer 07/26/2020.  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  See OV note pulmonary 10/2020. Osteoporosis:   2016 T score -1.1, 06/2019 T score - 1.6; 09-2022: T-score  (-) 2.6    Assessment & Plan Influenza A infection Viral syndrome, started 3 days ago. Tested positive for influenza A, COVID-negative. Although outside 48-hour window for antivirals, we will prescribe Tamiflu  prescribed due to the history of ILD. Otherwise supportive treatment, see AVS. Encouraged ER if symptoms severe and call if not better.  Encouraged to think about vaccines. Interstitial lung disease Was supposed to be on Trelegy however she could not afford it and is now on Wixela which she has at home.  States she does not need  a prescription for now.     "

## 2024-08-18 NOTE — Assessment & Plan Note (Signed)
 Influenza A infection Viral syndrome, started 3 days ago. Tested positive for influenza A, COVID-negative. Although outside 48-hour window for antivirals, we will prescribe Tamiflu  prescribed due to the history of ILD. Otherwise supportive treatment, see AVS. Encouraged ER if symptoms severe and call if not better.  Encouraged to think about vaccines. Interstitial lung disease Was supposed to be on Trelegy however she could not afford it and is now on Wixela which she has at home.  States she does not need a prescription for now.

## 2024-08-20 ENCOUNTER — Other Ambulatory Visit: Payer: Self-pay | Admitting: Internal Medicine

## 2024-08-20 ENCOUNTER — Telehealth: Payer: Self-pay

## 2024-08-20 NOTE — Telephone Encounter (Signed)
 Great, that is all I needed to know

## 2024-08-20 NOTE — Telephone Encounter (Signed)
 Spoke w/ Pt- states she is starting to feel better w/ Tamiflu . Denies fever or SHOB. Robitussin DM is helping w/ cough.

## 2024-09-22 ENCOUNTER — Ambulatory Visit: Admitting: Pulmonary Disease

## 2024-10-05 ENCOUNTER — Inpatient Hospital Stay

## 2024-10-05 ENCOUNTER — Inpatient Hospital Stay: Admitting: Nurse Practitioner

## 2024-11-03 ENCOUNTER — Ambulatory Visit

## 2025-01-20 ENCOUNTER — Ambulatory Visit: Admitting: Internal Medicine
